# Patient Record
Sex: Male | Born: 1950 | Race: Black or African American | Hispanic: No | Marital: Married | Smoking: Former smoker
Health system: Southern US, Community
[De-identification: ages and names within clinical notes are randomized; demographics above are authoritative.]

## PROBLEM LIST (undated history)

## (undated) DIAGNOSIS — I639 Cerebral infarction, unspecified: Secondary | ICD-10-CM

## (undated) DIAGNOSIS — E785 Hyperlipidemia, unspecified: Secondary | ICD-10-CM

## (undated) DIAGNOSIS — E119 Type 2 diabetes mellitus without complications: Secondary | ICD-10-CM

## (undated) DIAGNOSIS — R413 Other amnesia: Secondary | ICD-10-CM

## (undated) DIAGNOSIS — Z972 Presence of dental prosthetic device (complete) (partial): Secondary | ICD-10-CM

## (undated) DIAGNOSIS — I10 Essential (primary) hypertension: Secondary | ICD-10-CM

## (undated) DIAGNOSIS — M109 Gout, unspecified: Secondary | ICD-10-CM

## (undated) HISTORY — DX: Gout, unspecified: M10.9

## (undated) HISTORY — PX: APPENDECTOMY: SHX54

## (undated) HISTORY — DX: Other amnesia: R41.3

## (undated) HISTORY — DX: Essential (primary) hypertension: I10

## (undated) HISTORY — DX: Type 2 diabetes mellitus without complications: E11.9

## (undated) HISTORY — DX: Hyperlipidemia, unspecified: E78.5

## (undated) HISTORY — DX: Cerebral infarction, unspecified: I63.9

---

## 2004-09-26 ENCOUNTER — Ambulatory Visit: Payer: Self-pay | Admitting: Family Medicine

## 2008-08-10 ENCOUNTER — Emergency Department: Payer: Self-pay | Admitting: Emergency Medicine

## 2010-05-13 ENCOUNTER — Inpatient Hospital Stay: Payer: Self-pay | Admitting: Internal Medicine

## 2010-05-17 ENCOUNTER — Encounter: Payer: Self-pay | Admitting: Family Medicine

## 2010-05-19 ENCOUNTER — Encounter: Payer: Self-pay | Admitting: Family Medicine

## 2015-05-22 ENCOUNTER — Encounter: Payer: Self-pay | Admitting: Family Medicine

## 2015-05-22 ENCOUNTER — Ambulatory Visit (INDEPENDENT_AMBULATORY_CARE_PROVIDER_SITE_OTHER): Payer: BLUE CROSS/BLUE SHIELD | Admitting: Family Medicine

## 2015-05-22 VITALS — BP 106/60 | HR 64 | Temp 98.0°F | Resp 16 | Ht 69.0 in | Wt 178.4 lb

## 2015-05-22 DIAGNOSIS — I10 Essential (primary) hypertension: Secondary | ICD-10-CM | POA: Diagnosis not present

## 2015-05-22 DIAGNOSIS — E1149 Type 2 diabetes mellitus with other diabetic neurological complication: Secondary | ICD-10-CM | POA: Diagnosis not present

## 2015-05-22 DIAGNOSIS — I69054 Hemiplegia and hemiparesis following nontraumatic subarachnoid hemorrhage affecting left non-dominant side: Secondary | ICD-10-CM | POA: Diagnosis not present

## 2015-05-22 DIAGNOSIS — E785 Hyperlipidemia, unspecified: Secondary | ICD-10-CM

## 2015-05-22 DIAGNOSIS — Z8673 Personal history of transient ischemic attack (TIA), and cerebral infarction without residual deficits: Secondary | ICD-10-CM | POA: Diagnosis not present

## 2015-05-22 LAB — GLUCOSE, POCT (MANUAL RESULT ENTRY): POC Glucose: 111 mg/dL — AB (ref 70–99)

## 2015-05-22 LAB — POCT GLYCOSYLATED HEMOGLOBIN (HGB A1C): HEMOGLOBIN A1C: 6.2

## 2015-05-22 NOTE — Progress Notes (Signed)
Name: Mitchell Washington   MRN: [id number]    DOB: 1951-04-05   Date:05/22/2015       Progress Note  Subjective  Chief Complaint  Chief Complaint  Patient presents with  . Diabetes  . Hyperlipidemia  . Hypertension    HPI  Diabetes  Patient presents for follow-up of diabetes which is present for 5 years years. Is currently on a regimen of  metformin thousand milligrams daily obesit. Patient states somewhat  with their diet and exercise. There's been no hypoglycemic episodes and there is no  polyuria polydipsia polyphagia. His average fasting glucoses been in the low around not being checked not being checked  with a high around not being checked . There is no end organ disease.  Last diabetic eye exam was 2 years ago.   Last visit with dietitian was several years ago . Last microalbumin was obtained August of last year and was 20   Hypertension   Patient presents for follow-up of hypertension. It has been present for over  over 5.  Patient states that there is compliance with medical regimen which consists of  lisinopril 20-12 0.5 . There is no end organ disease remote history of CVA Cardiac risk factors include hypertension hyperlipidemia and diabetes.  Exercise regimen consist of  limited walking .  Diet consist of  intermittent compliance with his ADA diet .  Hyperlipidemia  Patient has a history of hyperlipidemia for  over 5 years.  Current medical regimen consist of  atorvastatin 40 mg daily at bedtime .  Compliance is  variable .  Diet and exercise are currently followed  sometimes .  Risk factors for cardiovascular disease include hyperlipidemia , hypertension, diabetes, history of CVA .   There have been no side effects from the medication.    CVA  Patient has minimal left hemiparesis past  CVA. There is also mild dysphagia intermittently. The problem seems to be significantly limiting  Gout  Patient has had a flare of gout within the last 3 months. He has refused to take  allopurinol on a regular basis in the past. Episodes are becoming more frequent.    Past Medical History  Diagnosis Date  . Diabetes mellitus without complication (HCC)   . Hyperlipidemia   . Hypertension   . Gout   . Memory loss   . CVA (cerebral infarction)     Social History  Substance Use Topics  . Smoking status: Former Games developer  . Smokeless tobacco: Not on file  . Alcohol Use: 0.0 oz/week    0 Standard drinks or equivalent per week     Current outpatient prescriptions:  .  aspirin 81 MG tablet, Take 81 mg by mouth daily., Disp: , Rfl:  .  atorvastatin (LIPITOR) 40 MG tablet, , Disp: , Rfl: 1 .  lisinopril-hydrochlorothiazide (PRINZIDE,ZESTORETIC) 20-12.5 MG tablet, , Disp: , Rfl: 0 .  metFORMIN (GLUCOPHAGE) 1000 MG tablet, , Disp: , Rfl: 3 .  Omega-3 300 MG CAPS, Take by mouth., Disp: , Rfl:   No Known Allergies  Review of Systems  Constitutional: Negative for fever, chills and weight loss.  HENT: Negative for congestion, hearing loss, sore throat and tinnitus.   Eyes: Negative for blurred vision, double vision and redness.  Respiratory: Negative for cough, hemoptysis and shortness of breath.   Cardiovascular: Negative for chest pain, palpitations, orthopnea, claudication and leg swelling.  Gastrointestinal: Negative for heartburn, nausea, vomiting, diarrhea, constipation and blood in stool.  Genitourinary: Negative for dysuria, urgency, frequency  and hematuria.  Musculoskeletal: Positive for joint pain. Negative for myalgias, back pain, falls and neck pain.  Skin: Negative for itching.  Neurological: Positive for focal weakness. Negative for dizziness, tingling, tremors, seizures, loss of consciousness, weakness and headaches.  Endo/Heme/Allergies: Does not bruise/bleed easily.  Psychiatric/Behavioral: Negative for depression and substance abuse. The patient is not nervous/anxious (minimal left hemiparesis) and does not have insomnia.      Objective  Filed  Vitals:   05/22/15 0851  BP: 106/60  Pulse: 64  Temp: 98 F (36.7 C)  Resp: 16  Height:  (1.753 m)  Weight: 178 lb 6 oz (80.91 kg)  SpO2: 98%     Physical Exam  Constitutional: He is oriented to person, place, and time and well-developed, well-nourished, and in no distress.  HENT:  Head: Normocephalic.  Eyes: EOM are normal. Pupils are equal, round, and reactive to light.  Neck: Normal range of motion. Neck supple. No thyromegaly present.  Cardiovascular: Normal rate, regular rhythm, normal heart sounds and intact distal pulses.   No murmur heard. Pulmonary/Chest: Effort normal and breath sounds normal. No respiratory distress. He has no wheezes.  Abdominal: Soft. Bowel sounds are normal.  Musculoskeletal: Normal range of motion. He exhibits no edema.  Lymphadenopathy:    He has no cervical adenopathy.  Neurological: He is alert and oriented to person, place, and time. No cranial nerve deficit. Gait normal. Coordination normal.  5-/5 strength in the upper extremities.  Skin: Skin is warm and dry. No rash noted.  Psychiatric: Judgment normal.  Somewhat anxious and loquacious      Assessment & Plan

## 2015-06-02 NOTE — Progress Notes (Signed)
Name: Mitchell Washington   MRN: [id number]    DOB: [birth year]   Date:06/02/2015       Progress Note  Subjective  Chief Complaint  Chief Complaint  Patient presents with  . Diabetes  . Hyperlipidemia  . Hypertension    HPI   Follow-up diabetes   Patient states she is more compliant with his diet and exercise currently. Is currently on metformin 1000 mg daily. He is not checking his regular blood sugars. This been no polyuria polydipsia polyphagia. Risk factors for cardiovascular disease include diabetes hypertension hyperlipidemia and history of CVA. Also H.   Hypertension follow    patient has history of hypertension for over 5 years. Is currently on lisinopril HCTZ 12.5 mg daily. He denies any chest pain palpitations orthopnea PND or extremity swelling. He is on a low sodium diet most part.   Hyperlipidemia    patient is currently on atorvastatin 40 mg by mouth daily at bedtime along with omega-3 capsules over-the-counter. Additionally takes aspirin 81 mg daily. He denies any side effects from the medications. This been no chest pain palpitations or claudication symptoms. Is no nausea vomiting or myalgias.   Past Medical History  Diagnosis Date  . Diabetes mellitus without complication (HCC)   . Hyperlipidemia   . Hypertension   . Gout   . Memory loss   . CVA (cerebral infarction)     Social History  Substance Use Topics  . Smoking status: Former Games developer  . Smokeless tobacco: Not on file  . Alcohol Use: 0.0 oz/week    0 Standard drinks or equivalent per week     Current outpatient prescriptions:  .  aspirin 81 MG tablet, Take 81 mg by mouth daily., Disp: , Rfl:  .  atorvastatin (LIPITOR) 40 MG tablet, , Disp: , Rfl: 1 .  lisinopril-hydrochlorothiazide (PRINZIDE,ZESTORETIC) 20-12.5 MG tablet, , Disp: , Rfl: 0 .  metFORMIN (GLUCOPHAGE) 1000 MG tablet, , Disp: , Rfl: 3 .  Omega-3 300 MG CAPS, Take by mouth., Disp: , Rfl:   No Known Allergies  Review of Systems   Constitutional: Negative for fever, chills and weight loss.  HENT: Negative for congestion, hearing loss, sore throat and tinnitus.   Eyes: Negative for blurred vision, double vision and redness.  Respiratory: Negative for cough, hemoptysis and shortness of breath.   Cardiovascular: Negative for chest pain, palpitations, orthopnea, claudication and leg swelling.  Gastrointestinal: Negative for heartburn, nausea, vomiting, diarrhea, constipation and blood in stool.  Genitourinary: Negative for dysuria, urgency, frequency and hematuria.  Musculoskeletal: Negative for myalgias, back pain, joint pain, falls and neck pain.  Skin: Negative for itching.  Neurological: Negative for dizziness, tingling, tremors, focal weakness, seizures, loss of consciousness, weakness and headaches.  Endo/Heme/Allergies: Does not bruise/bleed easily.  Psychiatric/Behavioral: Negative for depression and substance abuse. The patient is not nervous/anxious and does not have insomnia.      Objective  Filed Vitals:   05/22/15 0851  BP: 106/60  Pulse: 64  Temp: 98 F (36.7 C)  Resp: 16  Height:  (1.753 m)  Weight: 178 lb 6 oz (80.91 kg)  SpO2: 98%     Physical Exam  Constitutional: He is oriented to person, place, and time and well-developed, well-nourished, and in no distress.  HENT:  Head: Normocephalic.  Eyes: EOM are normal. Pupils are equal, round, and reactive to light.  Neck: Normal range of motion. Neck supple. No thyromegaly present.  Cardiovascular: Normal rate, regular rhythm, normal heart sounds and intact  distal pulses.   No murmur heard. Pulmonary/Chest: Effort normal and breath sounds normal. No respiratory distress. He has no wheezes.  Abdominal: Soft. Bowel sounds are normal.  Musculoskeletal: Normal range of motion. He exhibits no edema.  Lymphadenopathy:    He has no cervical adenopathy.  Neurological: He is alert and oriented to person, place, and time. No cranial nerve  deficit. Gait normal. Coordination normal.  Skin: Skin is warm and dry. No rash noted.  Psychiatric: Affect and judgment normal.      Assessment & Plan  1. DM (diabetes mellitus), type 2 with neurological complications (HCC) stable - POCT Glucose (CBG) - POCT HgB A1C  2. Essential hypertension Well-controlled  3. Hyperlipidemia stable  4. Status post CVA stable  5. Hemiplga following ntrm subarach hemor aff left nondom side fluctuating

## 2015-07-05 ENCOUNTER — Other Ambulatory Visit: Payer: Self-pay

## 2015-07-05 MED ORDER — METFORMIN HCL 1000 MG PO TABS: 1000.0000 mg | ORAL_TABLET | Freq: Every day | ORAL | Status: DC

## 2015-09-21 ENCOUNTER — Ambulatory Visit: Payer: BLUE CROSS/BLUE SHIELD | Admitting: Family Medicine

## 2015-10-20 ENCOUNTER — Ambulatory Visit: Payer: BLUE CROSS/BLUE SHIELD | Admitting: Family Medicine

## 2015-10-22 ENCOUNTER — Other Ambulatory Visit: Payer: Self-pay | Admitting: Family Medicine

## 2015-11-03 ENCOUNTER — Other Ambulatory Visit: Payer: Self-pay | Admitting: Family Medicine

## 2015-11-03 MED ORDER — METFORMIN HCL 1000 MG PO TABS: 1000.0000 mg | ORAL_TABLET | Freq: Every day | ORAL | Status: DC

## 2015-11-03 MED ORDER — ATORVASTATIN CALCIUM 40 MG PO TABS: 40.0000 mg | ORAL_TABLET | Freq: Once | ORAL | Status: DC

## 2015-11-03 MED ORDER — LISINOPRIL-HYDROCHLOROTHIAZIDE 20-12.5 MG PO TABS: 1.0000 | ORAL_TABLET | Freq: Every day | ORAL | Status: DC

## 2015-11-28 ENCOUNTER — Ambulatory Visit: Payer: BLUE CROSS/BLUE SHIELD | Admitting: Family Medicine

## 2016-01-22 ENCOUNTER — Telehealth: Payer: Self-pay | Admitting: Family Medicine

## 2016-01-22 NOTE — Telephone Encounter (Signed)
Requesting refill on Atorvastatin 40mg . I did inform patient that he will probably need to schedule appointment however he asked that I send the message anyway.

## 2016-01-23 MED ORDER — ATORVASTATIN CALCIUM 40 MG PO TABS: 40.0000 mg | ORAL_TABLET | Freq: Once | ORAL | Status: DC

## 2016-01-23 NOTE — Telephone Encounter (Signed)
I will send a 30 day supply with no refills, he must schedule ana appointment for refills. Sent to [facility] on [address]

## 2016-03-03 ENCOUNTER — Other Ambulatory Visit: Payer: Self-pay | Admitting: Family Medicine

## 2016-04-14 ENCOUNTER — Other Ambulatory Visit: Payer: Self-pay | Admitting: Family Medicine

## 2017-06-18 DIAGNOSIS — H34812 Central retinal vein occlusion, left eye, with macular edema: Secondary | ICD-10-CM | POA: Diagnosis not present

## 2017-07-17 DIAGNOSIS — H34812 Central retinal vein occlusion, left eye, with macular edema: Secondary | ICD-10-CM | POA: Diagnosis not present

## 2017-12-30 ENCOUNTER — Emergency Department
Admission: EM | Admit: 2017-12-30 | Discharge: 2017-12-30 | Disposition: A | Discharge status: Home / Self Care | Payer: Medicare HMO | Attending: Emergency Medicine | Admitting: Emergency Medicine

## 2017-12-30 ENCOUNTER — Other Ambulatory Visit: Payer: Self-pay

## 2017-12-30 DIAGNOSIS — Z9114 Patient's other noncompliance with medication regimen: Secondary | ICD-10-CM | POA: Insufficient documentation

## 2017-12-30 DIAGNOSIS — R001 Bradycardia, unspecified: Secondary | ICD-10-CM | POA: Diagnosis not present

## 2017-12-30 DIAGNOSIS — Z87891 Personal history of nicotine dependence: Secondary | ICD-10-CM | POA: Insufficient documentation

## 2017-12-30 DIAGNOSIS — R739 Hyperglycemia, unspecified: Secondary | ICD-10-CM | POA: Insufficient documentation

## 2017-12-30 DIAGNOSIS — E119 Type 2 diabetes mellitus without complications: Secondary | ICD-10-CM | POA: Diagnosis not present

## 2017-12-30 DIAGNOSIS — H538 Other visual disturbances: Secondary | ICD-10-CM | POA: Diagnosis present

## 2017-12-30 DIAGNOSIS — N289 Disorder of kidney and ureter, unspecified: Secondary | ICD-10-CM

## 2017-12-30 DIAGNOSIS — I10 Essential (primary) hypertension: Secondary | ICD-10-CM | POA: Insufficient documentation

## 2017-12-30 HISTORY — DX: Cerebral infarction, unspecified: I63.9

## 2017-12-30 LAB — BASIC METABOLIC PANEL
Anion gap: 6 (ref 5–15)
BUN: 14 mg/dL (ref 6–20)
CHLORIDE: 100 mmol/L — AB (ref 101–111)
CO2: 29 mmol/L (ref 22–32)
CREATININE: 1.32 mg/dL — AB (ref 0.61–1.24)
Calcium: 9.1 mg/dL (ref 8.9–10.3)
GFR calc non Af Amer: 55 mL/min — ABNORMAL LOW (ref >60)
GLUCOSE: 284 mg/dL — AB (ref 65–99)
Potassium: 3.7 mmol/L (ref 3.5–5.1)
Sodium: 135 mmol/L (ref 135–145)

## 2017-12-30 LAB — CBC
HCT: 43.1 % (ref 40.0–52.0)
Hemoglobin: 14.3 g/dL (ref 13.0–18.0)
MCH: 27.5 pg (ref 26.0–34.0)
MCHC: 33.2 g/dL (ref 32.0–36.0)
MCV: 83.1 fL (ref 80.0–100.0)
PLATELETS: 329 10*3/uL (ref 150–440)
RBC: 5.19 MIL/uL (ref 4.40–5.90)
RDW: 13.6 % (ref 11.5–14.5)
WBC: 7.8 10*3/uL (ref 3.8–10.6)

## 2017-12-30 LAB — URINALYSIS, COMPLETE (UACMP) WITH MICROSCOPIC
BILIRUBIN URINE: NEGATIVE
Bacteria, UA: NONE SEEN
Glucose, UA: >=500 mg/dL — AB
KETONES UR: NEGATIVE mg/dL
Nitrite: NEGATIVE
PH: 5 (ref 5.0–8.0)
Protein, ur: 30 mg/dL — AB
Specific Gravity, Urine: 1.021 (ref 1.005–1.030)

## 2017-12-30 LAB — GLUCOSE, CAPILLARY: Glucose-Capillary: 281 mg/dL — ABNORMAL HIGH (ref 65–99)

## 2017-12-30 MED ORDER — SODIUM CHLORIDE 0.9 % IV BOLUS: 1000.0000 mL | Freq: Once | INTRAVENOUS | Status: DC

## 2017-12-30 MED ORDER — INSULIN ASPART 100 UNIT/ML ~~LOC~~ SOLN
10.0000 [IU] | Freq: Once | SUBCUTANEOUS | Status: DC
  Filled 2017-12-30: qty 1

## 2017-12-30 MED ORDER — METOPROLOL TARTRATE 25 MG PO TABS: 25.0000 mg | ORAL_TABLET | Freq: Two times a day (BID) | ORAL | 0 refills | Status: DC

## 2017-12-30 MED ORDER — METFORMIN HCL 500 MG PO TABS: 500.0000 mg | ORAL_TABLET | Freq: Two times a day (BID) | ORAL | 0 refills | Status: DC

## 2017-12-30 MED ORDER — METOPROLOL TARTRATE 25 MG PO TABS
25.0000 mg | ORAL_TABLET | Freq: Once | ORAL | Status: AC
  Administered 2017-12-30: 25 mg via ORAL
  Filled 2017-12-30: qty 1

## 2017-12-30 MED ORDER — INSULIN ASPART 100 UNIT/ML ~~LOC~~ SOLN
10.0000 [IU] | Freq: Once | SUBCUTANEOUS | Status: AC
  Administered 2017-12-30: 10 [IU] via SUBCUTANEOUS

## 2017-12-30 NOTE — ED Notes (Signed)
Patient refusing IV and fluids at this time. States he hates needles and doesn't understand why he needs to be stuck again. This RN explained to patient that his glucose was high and he needed insulin and IV fluids to get it down. Patient verbalized understanding but continues to refuse IV. Will wait for MD evaluation before attempting IV again.

## 2017-12-30 NOTE — ED Triage Notes (Signed)
To ER via POV c/o blurry vision X 2-3 days. Pt also reports hyperglycemia and hypertension for 2-3 days. Pt alert and oriented X4, active, cooperative, pt in NAD. RR even and unlabored, color WNL.  Stroke screen negative.

## 2017-12-30 NOTE — ED Provider Notes (Signed)
[facility] Emergency Department Provider Note  ____________________________________________  Time seen: Approximately 9:47 PM  I have reviewed the triage vital signs and the nursing notes.   HISTORY  Chief Complaint Hyperglycemia and Blurred Vision    HPI Mitchell Washington is a 67 y.o. male w/ a hx of HTN, DM, HL, CVA, presenting for medication noncompliance with hypoglycemia and hypertension.  He and his wife report that he is not taking any medications for the past 2 or 3 years, since his primary care physician retired.  He has not attempted to get a new primary care physician.  He denies any acute symptoms and reports that his wife made him come in for evaluation tonight.  Past Medical History:  Diagnosis Date  . CVA (cerebral infarction)   . Diabetes mellitus without complication (HCC)   . Gout   . Hyperlipidemia   . Hypertension   . Memory loss   . Stroke [facility])     There are no active problems to display for this patient.   Past Surgical History:  Procedure Laterality Date  . APPENDECTOMY      Current Outpatient Rx  . Order #: [id number] Class: Historical Med  . Order #: [id number] Class: Normal  . Order #: [id number] Class: Normal  . Order #: [id number] Class: Print  . Order #: [id number] Class: Print  . Order #: [id number] Class: Historical Med    Allergies Patient has no known allergies.  Family History  Problem Relation Age of Onset  . Diabetes Mother     Social History Social History   Tobacco Use  . Smoking status: Former Smoker  Substance Use Topics  . Alcohol use: Yes    Alcohol/week: 0.0 oz  . Drug use: No    Review of Systems Constitutional: No fever/chills. No lightheadedness or syncope. Eyes: Progressively worsening vision w/ no acute changes tonight.  No diplopia. ENT: No sore throat. No congestion or rhinorrhea. Cardiovascular: Denies chest pain. Denies palpitations. Respiratory: Denies shortness of breath.  No  cough. Gastrointestinal: No abdominal pain.  No nausea, no vomiting.  No diarrhea.  No constipation. Genitourinary: Negative for dysuria. Musculoskeletal: Negative for back pain. Skin: Negative for rash. Neurological: Negative for headaches. No focal numbness, tingling or weakness.  Endocrine:+ frequent urination; no polydipsia.    ____________________________________________   PHYSICAL EXAM:  VITAL SIGNS: ED Triage Vitals  Enc Vitals Group     BP 12/30/17 1808 (!) 168/82     Pulse Rate 12/30/17 1808 67     Resp 12/30/17 1808 18     Temp 12/30/17 1808 98.5 F (36.9 C)     Temp Source 12/30/17 1808 Oral     SpO2 12/30/17 1808 98 %     Weight 12/30/17 1809 180 lb (81.6 kg)     Height 12/30/17 1809  (1.727 m)     Head Circumference --      Peak Flow --      Pain Score 12/30/17 1809 0     Pain Loc --      Pain Edu? --      Excl. in GC? --     Constitutional: Alert and oriented. Well appearing and in no acute distress. Answers questions appropriately. Eyes: Conjunctivae are normal.  EOMI. No scleral icterus. Head: Atraumatic. Nose: No congestion/rhinnorhea. Mouth/Throat: Mucous membranes are moist.  Neck: No stridor.  Supple.   Cardiovascular: Normal rate, regular rhythm. No murmurs, rubs or gallops.  Respiratory: Normal respiratory effort.  No accessory muscle use or retractions. Lungs  CTAB.  No wheezes, rales or ronchi. Gastrointestinal: Obese Soft, nontender and nondistended.  No guarding or rebound.  No peritoneal signs. Musculoskeletal: No LE edema. Neurologic:  A&Ox3.  Speech is clear.  Face and smile are symmetric.  EOMI.  Moves all extremities well. Skin:  Skin is warm, dry and intact. No rash noted. Psychiatric: Mood and affect are normal. Speech and behavior are normal.  Normal judgement  ____________________________________________   LABS (all labs ordered are listed, but only abnormal results are displayed)  Labs Reviewed  BASIC METABOLIC PANEL -  Abnormal; Notable for the following components:      Result Value   Chloride 100 (*)    Glucose, Bld 284 (*)    Creatinine, Ser 1.32 (*)    GFR calc non Af Amer 55 (*)    All other components within normal limits  URINALYSIS, COMPLETE (UACMP) WITH MICROSCOPIC - Abnormal; Notable for the following components:   Color, Urine YELLOW (*)    APPearance HAZY (*)    Glucose, UA >=500 (*)    Hgb urine dipstick SMALL (*)    Protein, ur 30 (*)    Leukocytes, UA LARGE (*)    WBC, UA >50 (*)    All other components within normal limits  GLUCOSE, CAPILLARY - Abnormal; Notable for the following components:   Glucose-Capillary 281 (*)    All other components within normal limits  CBC  CBG MONITORING, ED   ____________________________________________  EKG  ED ECG REPORT I, Rockne Menghini, the attending physician, personally viewed and interpreted this ECG.   Date: 12/30/2017  EKG Time: 2200  Rate: 58  Rhythm: sinus bradycardia  Axis: normal  Intervals:none  ST&T Change: No STEMI  ____________________________________________  RADIOLOGY  No results found.  ____________________________________________   PROCEDURES  Procedure(s) performed: None  Procedures  Critical Care performed: No ____________________________________________   INITIAL IMPRESSION / ASSESSMENT AND PLAN / ED COURSE  Pertinent labs & imaging results that were available during my care of the patient were reviewed by me and considered in my medical decision making (see chart for details).  67 y.o. M w/ a hx of HTN and HL, DM, CVA w/ medication noncompliance for 2-3 years brought by his wife for medication noncompliance w/o any acute complaints.  Patient is markedly hypertensive with a blood pressure of 209/110 on my examination.  We will treat him with metoprolol but he does not have any evidence of acute symptoms including CVA, ACS or MI.  His laboratory studies do show hyperglycemia w/o DKA, and renal  insufficiency which is likely worsened due to his chronically unmanaged hypertension.  The patient's blood counts are within normal limits.  He does have glucose urea but no bacteria in his urine.  He does have elevated wbc and LE so a culture has been sent.  Plan subq insulin w/ temporizing prescription for metformin, and metoprolol here with prescription, as well as referral for PMD.  The patient will be discharged and I have discussed f/u instructions and return precautions with him and his wife.  ____________________________________________  FINAL CLINICAL IMPRESSION(S) / ED DIAGNOSES  Final diagnoses:  Hyperglycemia  Hypertension, unspecified type  Renal insufficiency  Noncompliance with medications         NEW MEDICATIONS STARTED DURING THIS VISIT:  New Prescriptions   METFORMIN (GLUCOPHAGE) 500 MG TABLET    Take 1 tablet (500 mg total) by mouth 2 (two) times daily with a meal.   METOPROLOL TARTRATE (LOPRESSOR) 25 MG TABLET  Take 1 tablet (25 mg total) by mouth 2 (two) times daily.      Rockne Menghini, MD 12/30/17 2213

## 2017-12-30 NOTE — Discharge Instructions (Signed)
Please establish a primary care physician to manage all your chronic illnesses.  Take all your medications as prescribed.  Return to the emergency department for severe pain, lightheadedness or fainting, numbness tingling or weakness, chest pain, shortness of breath, vomiting, or any other symptoms concerning to you.

## 2018-01-15 DIAGNOSIS — E1142 Type 2 diabetes mellitus with diabetic polyneuropathy: Secondary | ICD-10-CM | POA: Insufficient documentation

## 2018-01-15 DIAGNOSIS — E7800 Pure hypercholesterolemia, unspecified: Secondary | ICD-10-CM | POA: Insufficient documentation

## 2018-01-15 DIAGNOSIS — N1832 Chronic kidney disease, stage 3b: Secondary | ICD-10-CM | POA: Insufficient documentation

## 2018-01-15 DIAGNOSIS — N183 Chronic kidney disease, stage 3 unspecified: Secondary | ICD-10-CM | POA: Insufficient documentation

## 2018-01-15 DIAGNOSIS — I679 Cerebrovascular disease, unspecified: Secondary | ICD-10-CM | POA: Insufficient documentation

## 2018-01-15 DIAGNOSIS — I10 Essential (primary) hypertension: Secondary | ICD-10-CM | POA: Insufficient documentation

## 2018-03-24 DIAGNOSIS — R413 Other amnesia: Secondary | ICD-10-CM | POA: Insufficient documentation

## 2018-10-01 ENCOUNTER — Ambulatory Visit: Payer: Medicare HMO | Admitting: Podiatry

## 2018-10-01 ENCOUNTER — Encounter: Payer: Self-pay | Admitting: Podiatry

## 2018-10-01 VITALS — BP 154/77 | HR 57

## 2018-10-01 DIAGNOSIS — E1151 Type 2 diabetes mellitus with diabetic peripheral angiopathy without gangrene: Secondary | ICD-10-CM

## 2018-10-01 DIAGNOSIS — M79674 Pain in right toe(s): Secondary | ICD-10-CM | POA: Diagnosis not present

## 2018-10-01 DIAGNOSIS — B351 Tinea unguium: Secondary | ICD-10-CM

## 2018-10-01 DIAGNOSIS — M79675 Pain in left toe(s): Secondary | ICD-10-CM | POA: Diagnosis not present

## 2018-10-01 NOTE — Progress Notes (Signed)
This patient presents to the office with chief complaint of long thick nails and diabetic feet.  This patient  says there  is  no pain and discomfort in his  feet.  This patient says there are long thick painful nails.  These nails are painful walking and wearing shoes.  Patient has no history of infection or drainage from both feet.  Patient is unable to  self treat his own nails . This patient presents  to the office today for treatment of the  long nails and a foot evaluation due to history of  diabetes.  General Appearance  Alert, conversant and in no acute stress.  Vascular  Dorsalis pedis and posterior tibial  pulses are not  palpable  bilaterally.  Capillary return is within normal limits  bilaterally. Temperature is within normal limits  bilaterally.  Neurologic  Senn-Weinstein monofilament wire test within normal limits  bilaterally. Muscle power within normal limits bilaterally.  Nails Thick disfigured discolored nails with subungual debris  from hallux to fifth toes bilaterally. No evidence of bacterial infection or drainage bilaterally.  Orthopedic  No limitations of motion of motion feet .  No crepitus or effusions noted.  No bony pathology or digital deformities noted.  Skin  normotropic skin with no porokeratosis noted bilaterally.  No signs of infections or ulcers noted.     Onychomycosis  Diabetes with no foot complications  IE  Debride nails x 10.  A diabetic foot exam was performed and there is no evidence of any  neurologic pathology.  Absent pulses  B/L.  RTC 3 months.   Helane Gunther DPM

## 2018-10-11 ENCOUNTER — Emergency Department: Payer: Medicare HMO

## 2018-10-11 ENCOUNTER — Other Ambulatory Visit: Payer: Self-pay

## 2018-10-11 ENCOUNTER — Encounter: Payer: Self-pay | Admitting: *Deleted

## 2018-10-11 ENCOUNTER — Emergency Department
Admission: EM | Admit: 2018-10-11 | Discharge: 2018-10-11 | Disposition: A | Discharge status: Home / Self Care | Payer: Medicare HMO | Attending: Emergency Medicine | Admitting: Emergency Medicine

## 2018-10-11 DIAGNOSIS — Z7982 Long term (current) use of aspirin: Secondary | ICD-10-CM | POA: Insufficient documentation

## 2018-10-11 DIAGNOSIS — I10 Essential (primary) hypertension: Secondary | ICD-10-CM | POA: Insufficient documentation

## 2018-10-11 DIAGNOSIS — R55 Syncope and collapse: Secondary | ICD-10-CM

## 2018-10-11 DIAGNOSIS — Z79899 Other long term (current) drug therapy: Secondary | ICD-10-CM | POA: Diagnosis not present

## 2018-10-11 DIAGNOSIS — Z8673 Personal history of transient ischemic attack (TIA), and cerebral infarction without residual deficits: Secondary | ICD-10-CM | POA: Insufficient documentation

## 2018-10-11 DIAGNOSIS — N390 Urinary tract infection, site not specified: Secondary | ICD-10-CM | POA: Insufficient documentation

## 2018-10-11 DIAGNOSIS — E119 Type 2 diabetes mellitus without complications: Secondary | ICD-10-CM | POA: Diagnosis not present

## 2018-10-11 DIAGNOSIS — Z87891 Personal history of nicotine dependence: Secondary | ICD-10-CM | POA: Insufficient documentation

## 2018-10-11 LAB — CBC
HCT: 47.2 % (ref 39.0–52.0)
HEMOGLOBIN: 15 g/dL (ref 13.0–17.0)
MCH: 27 pg (ref 26.0–34.0)
MCHC: 31.8 g/dL (ref 30.0–36.0)
MCV: 84.9 fL (ref 80.0–100.0)
NRBC: 0 % (ref 0.0–0.2)
Platelets: 332 10*3/uL (ref 150–400)
RBC: 5.56 MIL/uL (ref 4.22–5.81)
RDW: 13.8 % (ref 11.5–15.5)
WBC: 9.3 10*3/uL (ref 4.0–10.5)

## 2018-10-11 LAB — URINALYSIS, COMPLETE (UACMP) WITH MICROSCOPIC
Bilirubin Urine: NEGATIVE
Glucose, UA: NEGATIVE mg/dL
KETONES UR: NEGATIVE mg/dL
Nitrite: NEGATIVE
PROTEIN: 100 mg/dL — AB
Specific Gravity, Urine: 1.028 (ref 1.005–1.030)
pH: 5 (ref 5.0–8.0)

## 2018-10-11 LAB — BASIC METABOLIC PANEL
Anion gap: 10 (ref 5–15)
BUN: 19 mg/dL (ref 8–23)
CO2: 26 mmol/L (ref 22–32)
Calcium: 9 mg/dL (ref 8.9–10.3)
Chloride: 105 mmol/L (ref 98–111)
Creatinine, Ser: 1.55 mg/dL — ABNORMAL HIGH (ref 0.61–1.24)
GFR, EST AFRICAN AMERICAN: 53 mL/min — AB (ref >60)
GFR, EST NON AFRICAN AMERICAN: 46 mL/min — AB (ref >60)
Glucose, Bld: 129 mg/dL — ABNORMAL HIGH (ref 70–99)
POTASSIUM: 3.8 mmol/L (ref 3.5–5.1)
SODIUM: 141 mmol/L (ref 135–145)

## 2018-10-11 LAB — TROPONIN I

## 2018-10-11 MED ORDER — SODIUM CHLORIDE 0.9 % IV BOLUS
500.0000 mL | Freq: Once | INTRAVENOUS | Status: AC
  Administered 2018-10-11: 500 mL via INTRAVENOUS

## 2018-10-11 MED ORDER — AMOXICILLIN-POT CLAVULANATE 875-125 MG PO TABS
1.0000 | ORAL_TABLET | Freq: Once | ORAL | Status: AC
  Administered 2018-10-11: 1 via ORAL
  Filled 2018-10-11: qty 1

## 2018-10-11 MED ORDER — AMOXICILLIN-POT CLAVULANATE 875-125 MG PO TABS: 1.0000 | ORAL_TABLET | Freq: Two times a day (BID) | ORAL | 0 refills | Status: AC

## 2018-10-11 NOTE — ED Provider Notes (Signed)
[facility] Emergency Department Provider Note ____________________________________________   First MD Initiated Contact with Patient 10/11/18 1241     (approximate)  I have reviewed the triage vital signs and the nursing notes.  HISTORY  Chief Complaint Near Syncope  EM caveat: The patient himself has severe short-term memory problems, cannot recall exactly what happened.  Family reports this is normal for him  HPI Mitchell Washington is a 68 y.o. male here for evaluation after passing out  Patient was preaching at church, as he was standing at the public for about 10 to 15 minutes he started to be observed by family start looking lightheaded.  He was assisted into a chair and passed out very briefly.  He woke up oriented.  Brought for further evaluation.  Patient reports he feels all right now.  Family reports he does not have enough water and fluid intake some days.  He also had a little bit of lightheadedness yesterday that he had noted.  No numbness with weakness tingling or trouble speaking.  No chest pain or trouble breathing.  Denies a history of any heart problem.  Does however have a history of stroke that has left him with severe short-term memory problems   Past Medical History:  Diagnosis Date  . CVA (cerebral infarction)   . Diabetes mellitus without complication (HCC)   . Gout   . Hyperlipidemia   . Hypertension   . Memory loss   . Stroke [facility](HCC)     There are no active problems to display for this patient.   Past Surgical History:  Procedure Laterality Date  . APPENDECTOMY      Prior to Admission medications   Medication Sig Start Date End Date Taking? Authorizing Provider  amoxicillin-clavulanate (AUGMENTIN) 875-125 MG tablet Take 1 tablet by mouth 2 (two) times daily for 7 days. 10/11/18 10/18/18  Sharyn CreamerQuale, Mark, MD  aspirin EC 81 MG tablet Take by mouth.    [provider]  atorvastatin (LIPITOR) 10 MG tablet TAKE 1 TABLET BY MOUTH  EVERY DAY 07/06/18   [provider]  [employer] VITAMIN B12 1000 MCG tablet Take 1,000 mcg by mouth daily. 09/21/18   [provider]  donepezil (ARICEPT) 10 MG tablet  08/27/18   [provider]  glipiZIDE (GLUCOTROL XL) 5 MG 24 hr tablet Take by mouth. 04/15/18 04/15/19  [provider]  lisinopril (PRINIVIL,ZESTRIL) 20 MG tablet TAKE 1 TABLET BY MOUTH EVERY DAY 07/06/18   [provider]  memantine (NAMENDA) 5 MG tablet Take by mouth. 09/02/18 09/02/19  [provider]  metFORMIN (GLUCOPHAGE-XR) 500 MG 24 hr tablet Take by mouth. 01/15/18 01/15/19  [provider]    Allergies Patient has no known allergies.  Family History  Problem Relation Age of Onset  . Diabetes Mother     Social History Social History   Tobacco Use  . Smoking status: Former Games developermoker  . Smokeless tobacco: Never Used  Substance Use Topics  . Alcohol use: Not Currently    Alcohol/week: 0.0 standard drinks  . Drug use: Never    Review of Systems EM caveat Family reports no recent illness.  He is acting normally for them.  He has never passed out before.    ____________________________________________   PHYSICAL EXAM:  VITAL SIGNS: ED Triage Vitals  Enc Vitals Group     BP 10/11/18 1243 (!) 142/74     Pulse Rate 10/11/18 1243 66     Resp 10/11/18 1243 18  Temp 10/11/18 1243 97.6 F (36.4 C)     Temp Source 10/11/18 1243 Oral     SpO2 10/11/18 1243 99 %     Weight 10/11/18 1246 175 lb (79.4 kg)     Height 10/11/18 1246 5\' 10"  (1.778 m)     Head Circumference --      Peak Flow --      Pain Score 10/11/18 1245 0     Pain Loc --      Pain Edu? --      Excl. in GC? --     Constitutional: Alert and oriented. Well appearing and in no acute distress.  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 Eyes: Conjunctivae are normal. Head: Atraumatic. Nose: No  congestion/rhinnorhea. Mouth/Throat: Mucous membranes are moist. Neck: No stridor.  Cardiovascular: Normal rate, regular rhythm. Grossly normal heart sounds.  Good peripheral circulation. Respiratory: Normal respiratory effort.  No retractions. Lungs CTAB. Gastrointestinal: Soft and nontender. No distention. Musculoskeletal: No lower extremity tenderness nor edema. Neurologic:  Normal speech and language. No gross focal neurologic deficits are appreciated.  Skin:  Skin is warm, dry and intact. No rash noted. Psychiatric: Mood and affect are normal. Speech and behavior are normal.  ____________________________________________   LABS (all labs ordered are listed, but only abnormal results are displayed)  Labs Reviewed  URINE CULTURE - Abnormal; Notable for the following components:      Result Value   Culture   (*)    Value: <10,000 COLONIES/mL INSIGNIFICANT GROWTH Performed at [facility], [address], [place], [place] [address]    All other components within normal limits  BASIC METABOLIC PANEL - Abnormal; Notable for the following components:   Glucose, Bld 129 (*)    Creatinine, Ser 1.55 (*)    GFR calc non Af Amer 46 (*)    GFR calc Af Amer 53 (*)    All other components within normal limits  URINALYSIS, COMPLETE (UACMP) WITH MICROSCOPIC - Abnormal; Notable for the following components:   Color, Urine YELLOW (*)    APPearance HAZY (*)    Hgb urine dipstick SMALL (*)    Protein, ur 100 (*)    Leukocytes,Ua TRACE (*)    Bacteria, UA RARE (*)    All other components within normal limits  CBC  TROPONIN I   ____________________________________________  EKG  Reviewed entered by 1300 Heart rate 65 QRS 110 QTc 400 And interpreted by me Reviewed by me, no evidence of acute ischemia ____________________________________________  RADIOLOGY  No results found.  No indication for imaging denoted.  Neurologically normal.  Memory deficits are normal per family and  is acting and behaving normally without neurologic symptoms. ____________________________________________   PROCEDURES  Procedure(s) performed: None  Procedures  Critical Care performed: No  ____________________________________________   INITIAL IMPRESSION / ASSESSMENT AND PLAN / ED COURSE  Pertinent labs & imaging results that were available during my care of the patient were reviewed by me and considered in my medical decision making (see chart for details).   Syncope.  Patient was witnessed to have a syncopal episode, he was assisted to the ground to chair and did not strike the ground.  He is fully awake and alert.  He feels well at this time, but he does have notable short-term memory deficit, seems to recall distant events well and recalls passages that he was preaching, but does not seem  to have much recollection of the reasoning is here today.        Patient's previous neurology consultation did notes that he has notable short-term memory loss.  Suspected also to have moderate dementia.  Prior stroke is known, appears it is MRI distribution correlates fairly well with the chronic findings noted on today's CT and he does not have any focal neurologic deficits on exam to suggest a new or acute stroke.  Clinical history does not appear consistent with acute stroke.   ----------------------------------------- 2:56 PM on 10/11/2018 -----------------------------------------  Patient reports he is feeling well.  Would like to be discharged.  His family at the bedside report his memory issues are chronic, no change and he is acting and behaving completely normally.  SF syncope rule is NEGATIVE.  We will assist to ambulate and is since "road test" and see if he is feeling improved and send urinalysis.  If doing well anticipate discharge as he is fully awake and alert, full neurologic recovery, in no distress with reassuring evaluation here.  Patient would like to go home.   Family agreeable with plan. Return precautions and treatment recommendations and follow-up discussed with the patient who is agreeable with the plan.  Patient's son and daughter as well as wife are at bedside.  Taking him home. ____________________________________________   FINAL CLINICAL IMPRESSION(S) / ED DIAGNOSES  Final diagnoses:  Syncope, unspecified syncope type  Urinary tract infection, acute        Note:  This document was prepared using Dragon voice recognition software and may include unintentional dictation errors       Sharyn Creamer, MD 10/15/18 1238

## 2018-10-11 NOTE — ED Triage Notes (Signed)
Per EMS report, patient had an episode of sweating and dizziness at 0900 today. 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 Patient has a history of dementia with short-term memory loss.

## 2018-10-11 NOTE — ED Notes (Signed)
Pt ambulatory to end of hall and back without difficulty.

## 2018-10-13 LAB — URINE CULTURE

## 2018-12-07 ENCOUNTER — Ambulatory Visit: Payer: Medicare HMO | Admitting: Podiatry

## 2018-12-07 ENCOUNTER — Encounter: Payer: Self-pay | Admitting: Podiatry

## 2018-12-07 ENCOUNTER — Other Ambulatory Visit: Payer: Self-pay

## 2018-12-07 ENCOUNTER — Ambulatory Visit (INDEPENDENT_AMBULATORY_CARE_PROVIDER_SITE_OTHER): Payer: Medicare HMO | Admitting: Podiatry

## 2018-12-07 VITALS — Temp 98.3°F

## 2018-12-07 DIAGNOSIS — E1151 Type 2 diabetes mellitus with diabetic peripheral angiopathy without gangrene: Secondary | ICD-10-CM

## 2018-12-07 DIAGNOSIS — M79674 Pain in right toe(s): Secondary | ICD-10-CM | POA: Diagnosis not present

## 2018-12-07 DIAGNOSIS — M79675 Pain in left toe(s): Secondary | ICD-10-CM

## 2018-12-07 DIAGNOSIS — B351 Tinea unguium: Secondary | ICD-10-CM

## 2018-12-07 NOTE — Progress Notes (Signed)
Complaint:  Visit Type: Patient returns to my office for continued preventative foot care services. Complaint: Patient states" my nails have grown long and thick and become painful to walk and wear shoes" Patient has been diagnosed with DM with no foot complications. The patient presents for preventative foot care services. No changes to ROS  Podiatric Exam: Vascular: dorsalis pedis and posterior tibial pulses are not  palpable bilateral. Capillary return is immediate. Temperature gradient is WNL. Skin turgor WNL  Sensorium: Normal Semmes Weinstein monofilament test. Normal tactile sensation bilaterally. Nail Exam: Pt has thick disfigured discolored nails with subungual debris noted bilateral entire nail hallux through fifth toenails Ulcer Exam: There is no evidence of ulcer or pre-ulcerative changes or infection. Orthopedic Exam: Muscle tone and strength are WNL. No limitations in general ROM. No crepitus or effusions noted. Foot type and digits show no abnormalities. Bony prominences are unremarkable. Skin: No Porokeratosis. No infection or ulcers  Diagnosis:  Onychomycosis, , Pain in right toe, pain in left toes  Treatment & Plan Procedures and Treatment: Consent by patient was obtained for treatment procedures.   Debridement of mycotic and hypertrophic toenails, 1 through 5 bilateral and clearing of subungual debris. No ulceration, no infection noted.  Return Visit-Office Procedure: Patient instructed to return to the office for a follow up visit 3 months for continued evaluation and treatment.    Winston Sobczyk DPM 

## 2019-03-08 ENCOUNTER — Ambulatory Visit: Payer: Medicare HMO | Admitting: Podiatry

## 2019-04-08 ENCOUNTER — Ambulatory Visit: Payer: Medicare HMO | Admitting: Podiatry

## 2019-04-12 ENCOUNTER — Other Ambulatory Visit: Payer: Self-pay

## 2019-04-12 ENCOUNTER — Encounter: Payer: Self-pay | Admitting: Podiatry

## 2019-04-12 ENCOUNTER — Ambulatory Visit: Payer: Medicare HMO | Admitting: Podiatry

## 2019-04-12 VITALS — Temp 97.8°F

## 2019-04-12 DIAGNOSIS — B351 Tinea unguium: Secondary | ICD-10-CM | POA: Diagnosis not present

## 2019-04-12 DIAGNOSIS — E1151 Type 2 diabetes mellitus with diabetic peripheral angiopathy without gangrene: Secondary | ICD-10-CM

## 2019-04-12 DIAGNOSIS — M79674 Pain in right toe(s): Secondary | ICD-10-CM

## 2019-04-12 DIAGNOSIS — M79675 Pain in left toe(s): Secondary | ICD-10-CM

## 2019-04-12 NOTE — Progress Notes (Signed)
Complaint:  Visit Type: Patient returns to my office for continued preventative foot care services. Complaint: Patient states" my nails have grown long and thick and become painful to walk and wear shoes" Patient has been diagnosed with DM with no foot complications. The patient presents for preventative foot care services. No changes to ROS  Podiatric Exam: Vascular: dorsalis pedis and posterior tibial pulses are not  palpable bilateral. Capillary return is immediate. Temperature gradient is WNL. Skin turgor WNL  Sensorium: Normal Semmes Weinstein monofilament test. Normal tactile sensation bilaterally. Nail Exam: Pt has thick disfigured discolored nails with subungual debris noted bilateral entire nail hallux through fifth toenails Ulcer Exam: There is no evidence of ulcer or pre-ulcerative changes or infection. Orthopedic Exam: Muscle tone and strength are WNL. No limitations in general ROM. No crepitus or effusions noted. Foot type and digits show no abnormalities. Bony prominences are unremarkable. Skin: No Porokeratosis. No infection or ulcers  Diagnosis:  Onychomycosis, , Pain in right toe, pain in left toes  Treatment & Plan Procedures and Treatment: Consent by patient was obtained for treatment procedures.   Debridement of mycotic and hypertrophic toenails, 1 through 5 bilateral and clearing of subungual debris. No ulceration, no infection noted.  Return Visit-Office Procedure: Patient instructed to return to the office for a follow up visit 3 months for continued evaluation and treatment.    Vastie Douty DPM 

## 2019-07-12 ENCOUNTER — Other Ambulatory Visit: Payer: Self-pay

## 2019-07-12 ENCOUNTER — Ambulatory Visit (INDEPENDENT_AMBULATORY_CARE_PROVIDER_SITE_OTHER): Payer: Medicare HMO | Admitting: Podiatry

## 2019-07-12 ENCOUNTER — Encounter: Payer: Self-pay | Admitting: Podiatry

## 2019-07-12 DIAGNOSIS — M79674 Pain in right toe(s): Secondary | ICD-10-CM | POA: Diagnosis not present

## 2019-07-12 DIAGNOSIS — B351 Tinea unguium: Secondary | ICD-10-CM

## 2019-07-12 DIAGNOSIS — M79675 Pain in left toe(s): Secondary | ICD-10-CM | POA: Diagnosis not present

## 2019-07-12 DIAGNOSIS — E1151 Type 2 diabetes mellitus with diabetic peripheral angiopathy without gangrene: Secondary | ICD-10-CM | POA: Diagnosis not present

## 2019-07-12 NOTE — Progress Notes (Signed)
Complaint:  Visit Type: Patient returns to my office for continued preventative foot care services. Complaint: Patient states" my nails have grown long and thick and become painful to walk and wear shoes" Patient has been diagnosed with DM with no foot complications. The patient presents for preventative foot care services. No changes to ROS  Podiatric Exam: Vascular: dorsalis pedis and posterior tibial pulses are not  palpable bilateral. Capillary return is immediate. Temperature gradient is WNL. Skin turgor WNL  Sensorium: Normal Semmes Weinstein monofilament test. Normal tactile sensation bilaterally. Nail Exam: Pt has thick disfigured discolored nails with subungual debris noted bilateral entire nail hallux through fifth toenails Ulcer Exam: There is no evidence of ulcer or pre-ulcerative changes or infection. Orthopedic Exam: Muscle tone and strength are WNL. No limitations in general ROM. No crepitus or effusions noted. Foot type and digits show no abnormalities. Bony prominences are unremarkable. Skin: No Porokeratosis. No infection or ulcers  Diagnosis:  Onychomycosis, , Pain in right toe, pain in left toes  Treatment & Plan Procedures and Treatment: Consent by patient was obtained for treatment procedures.   Debridement of mycotic and hypertrophic toenails, 1 through 5 bilateral and clearing of subungual debris. No ulceration, no infection noted.  Return Visit-Office Procedure: Patient instructed to return to the office for a follow up visit 3 months for continued evaluation and treatment.    Jamecia Lerman DPM 

## 2019-09-06 DIAGNOSIS — E538 Deficiency of other specified B group vitamins: Secondary | ICD-10-CM | POA: Insufficient documentation

## 2019-10-02 ENCOUNTER — Ambulatory Visit: Payer: Medicare HMO | Attending: Internal Medicine

## 2019-10-02 DIAGNOSIS — Z23 Encounter for immunization: Secondary | ICD-10-CM | POA: Insufficient documentation

## 2019-10-02 NOTE — Progress Notes (Signed)
   Covid-19 Vaccination Clinic  Name:  Mitchell Washington    MRN: [id number] DOB: July 12, 1951  10/02/2019  [name] was observed post Covid-19 immunization for 15 minutes without incidence. He was provided with Vaccine Information Sheet and instruction to access the V-Safe system.   [name] was instructed to call 911 with any severe reactions post vaccine: Marland Kitchen Difficulty breathing  . Swelling of your face and throat  . A fast heartbeat  . A bad rash all over your body  . Dizziness and weakness    Immunizations Administered    Name Date Dose VIS Date Route   Pfizer COVID-19 Vaccine 10/02/2019  9:31 AM 0.3 mL 07/30/2019 Intramuscular   Manufacturer: [employer], [employer]   Lot: [id number]   NDC: [address]-4

## 2019-10-11 ENCOUNTER — Encounter: Payer: Self-pay | Admitting: Podiatry

## 2019-10-11 ENCOUNTER — Other Ambulatory Visit: Payer: Self-pay

## 2019-10-11 ENCOUNTER — Ambulatory Visit: Payer: Medicare HMO | Admitting: Podiatry

## 2019-10-11 DIAGNOSIS — M79675 Pain in left toe(s): Secondary | ICD-10-CM | POA: Diagnosis not present

## 2019-10-11 DIAGNOSIS — M79674 Pain in right toe(s): Secondary | ICD-10-CM | POA: Diagnosis not present

## 2019-10-11 DIAGNOSIS — B351 Tinea unguium: Secondary | ICD-10-CM

## 2019-10-11 DIAGNOSIS — E1151 Type 2 diabetes mellitus with diabetic peripheral angiopathy without gangrene: Secondary | ICD-10-CM | POA: Diagnosis not present

## 2019-10-11 NOTE — Progress Notes (Signed)
Complaint:  Visit Type: Patient returns to my office for continued preventative foot care services. Complaint: Patient states" my nails have grown long and thick and become painful to walk and wear shoes" Patient has been diagnosed with DM with no foot complications. The patient presents for preventative foot care services. No changes to ROS  Podiatric Exam: Vascular: dorsalis pedis and posterior tibial pulses are not  palpable bilateral. Capillary return is immediate. Temperature gradient is WNL. Skin turgor WNL  Sensorium: Normal Semmes Weinstein monofilament test. Normal tactile sensation bilaterally. Nail Exam: Pt has thick disfigured discolored nails with subungual debris noted bilateral entire nail hallux through fifth toenails Ulcer Exam: There is no evidence of ulcer or pre-ulcerative changes or infection. Orthopedic Exam: Muscle tone and strength are WNL. No limitations in general ROM. No crepitus or effusions noted. Foot type and digits show no abnormalities. Bony prominences are unremarkable. Skin: No Porokeratosis. No infection or ulcers  Diagnosis:  Onychomycosis, , Pain in right toe, pain in left toes  Treatment & Plan Procedures and Treatment: Consent by patient was obtained for treatment procedures.   Debridement of mycotic and hypertrophic toenails, 1 through 5 bilateral and clearing of subungual debris. No ulceration, no infection noted.  Return Visit-Office Procedure: Patient instructed to return to the office for a follow up visit 3 months for continued evaluation and treatment.    Helane Gunther DPM

## 2019-10-19 ENCOUNTER — Other Ambulatory Visit: Payer: Self-pay

## 2019-10-19 ENCOUNTER — Encounter: Payer: Self-pay | Admitting: Ophthalmology

## 2019-10-21 ENCOUNTER — Other Ambulatory Visit: Payer: Self-pay

## 2019-10-21 ENCOUNTER — Other Ambulatory Visit
Admission: RE | Admit: 2019-10-21 | Discharge: 2019-10-21 | Disposition: A | Discharge status: Home / Self Care | Payer: Medicare HMO | Source: Ambulatory Visit | Attending: Ophthalmology | Admitting: Ophthalmology

## 2019-10-21 DIAGNOSIS — Z01812 Encounter for preprocedural laboratory examination: Secondary | ICD-10-CM | POA: Insufficient documentation

## 2019-10-21 DIAGNOSIS — Z20822 Contact with and (suspected) exposure to covid-19: Secondary | ICD-10-CM | POA: Diagnosis not present

## 2019-10-21 LAB — SARS CORONAVIRUS 2 (TAT 6-24 HRS): SARS Coronavirus 2: NEGATIVE

## 2019-10-22 NOTE — Discharge Instructions (Signed)

## 2019-10-23 ENCOUNTER — Ambulatory Visit: Payer: Medicare HMO | Attending: Internal Medicine

## 2019-10-23 DIAGNOSIS — Z23 Encounter for immunization: Secondary | ICD-10-CM

## 2019-10-23 NOTE — Progress Notes (Signed)
   Covid-19 Vaccination Clinic  Name:  KEMONTE ULLMAN    MRN: [id number] DOB: 14-Apr-1951  10/23/2019  Mr. Bowell was observed post Covid-19 immunization for 15 minutes without incident. He was provided with Vaccine Information Sheet and instruction to access the V-Safe system.   Mr. Buffone was instructed to call 911 with any severe reactions post vaccine: Marland Kitchen Difficulty breathing  . Swelling of face and throat  . A fast heartbeat  . A bad rash all over body  . Dizziness and weakness   Immunizations Administered    Name Date Dose VIS Date Route   Pfizer COVID-19 Vaccine 10/23/2019  9:08 AM 0.3 mL 07/30/2019 Intramuscular   Manufacturer: [employer], [employer]   Lot: [id number]   NDC: [address]-8

## 2019-10-25 ENCOUNTER — Ambulatory Visit
Admission: RE | Admit: 2019-10-25 | Discharge: 2019-10-25 | Disposition: A | Discharge status: Home / Self Care | Payer: Medicare HMO | Attending: Ophthalmology | Admitting: Ophthalmology

## 2019-10-25 ENCOUNTER — Encounter
Admission: RE | Disposition: A | Discharge status: Home / Self Care | Payer: Self-pay | Source: Home / Self Care | Attending: Ophthalmology

## 2019-10-25 ENCOUNTER — Ambulatory Visit: Payer: Medicare HMO | Admitting: Anesthesiology

## 2019-10-25 ENCOUNTER — Other Ambulatory Visit: Payer: Self-pay

## 2019-10-25 ENCOUNTER — Encounter: Payer: Self-pay | Admitting: Ophthalmology

## 2019-10-25 DIAGNOSIS — Z87891 Personal history of nicotine dependence: Secondary | ICD-10-CM | POA: Insufficient documentation

## 2019-10-25 DIAGNOSIS — Z7982 Long term (current) use of aspirin: Secondary | ICD-10-CM | POA: Insufficient documentation

## 2019-10-25 DIAGNOSIS — Z8673 Personal history of transient ischemic attack (TIA), and cerebral infarction without residual deficits: Secondary | ICD-10-CM | POA: Insufficient documentation

## 2019-10-25 DIAGNOSIS — Z7984 Long term (current) use of oral hypoglycemic drugs: Secondary | ICD-10-CM | POA: Diagnosis not present

## 2019-10-25 DIAGNOSIS — Z79899 Other long term (current) drug therapy: Secondary | ICD-10-CM | POA: Diagnosis not present

## 2019-10-25 DIAGNOSIS — F0390 Unspecified dementia without behavioral disturbance: Secondary | ICD-10-CM | POA: Insufficient documentation

## 2019-10-25 DIAGNOSIS — H2512 Age-related nuclear cataract, left eye: Secondary | ICD-10-CM | POA: Insufficient documentation

## 2019-10-25 DIAGNOSIS — E119 Type 2 diabetes mellitus without complications: Secondary | ICD-10-CM | POA: Diagnosis not present

## 2019-10-25 DIAGNOSIS — I10 Essential (primary) hypertension: Secondary | ICD-10-CM | POA: Diagnosis not present

## 2019-10-25 DIAGNOSIS — E7800 Pure hypercholesterolemia, unspecified: Secondary | ICD-10-CM | POA: Diagnosis not present

## 2019-10-25 DIAGNOSIS — H34812 Central retinal vein occlusion, left eye, with macular edema: Secondary | ICD-10-CM | POA: Insufficient documentation

## 2019-10-25 HISTORY — PX: CATARACT EXTRACTION W/PHACO: SHX586

## 2019-10-25 HISTORY — DX: Presence of dental prosthetic device (complete) (partial): Z97.2

## 2019-10-25 LAB — GLUCOSE, CAPILLARY
Glucose-Capillary: 143 mg/dL — ABNORMAL HIGH (ref 70–99)
Glucose-Capillary: 145 mg/dL — ABNORMAL HIGH (ref 70–99)

## 2019-10-25 SURGERY — PHACOEMULSIFICATION, CATARACT, WITH IOL INSERTION
Anesthesia: General | Site: Eye | Laterality: Left

## 2019-10-25 MED ORDER — SODIUM HYALURONATE 23 MG/ML IO SOLN
INTRAOCULAR | Status: DC | PRN
  Administered 2019-10-25: 0.6 mL via INTRAOCULAR

## 2019-10-25 MED ORDER — TETRACAINE HCL 0.5 % OP SOLN
1.0000 [drp] | OPHTHALMIC | Status: DC | PRN
  Administered 2019-10-25 (×3): 1 [drp] via OPHTHALMIC

## 2019-10-25 MED ORDER — LIDOCAINE HCL (PF) 2 % IJ SOLN
INTRAOCULAR | Status: DC | PRN
  Administered 2019-10-25: 1 mL via INTRAOCULAR

## 2019-10-25 MED ORDER — MOXIFLOXACIN HCL 0.5 % OP SOLN
OPHTHALMIC | Status: DC | PRN
  Administered 2019-10-25: 0.2 mL via OPHTHALMIC

## 2019-10-25 MED ORDER — ACETAMINOPHEN 160 MG/5ML PO SOLN: 325.0000 mg | ORAL | Status: DC | PRN

## 2019-10-25 MED ORDER — MIDAZOLAM HCL 2 MG/2ML IJ SOLN
INTRAMUSCULAR | Status: DC | PRN
  Administered 2019-10-25: 2 mg via INTRAVENOUS

## 2019-10-25 MED ORDER — SODIUM HYALURONATE 10 MG/ML IO SOLN
INTRAOCULAR | Status: DC | PRN
  Administered 2019-10-25: 0.55 mL via INTRAOCULAR

## 2019-10-25 MED ORDER — TRIAMCINOLONE ACETONIDE 40 MG/ML IJ SUSP
INTRAMUSCULAR | Status: DC | PRN
  Administered 2019-10-25: .1 mL

## 2019-10-25 MED ORDER — ACETAMINOPHEN 325 MG PO TABS: 325.0000 mg | ORAL_TABLET | ORAL | Status: DC | PRN

## 2019-10-25 MED ORDER — EPINEPHRINE PF 1 MG/ML IJ SOLN
INTRAOCULAR | Status: DC | PRN
  Administered 2019-10-25: 88 mL via OPHTHALMIC

## 2019-10-25 MED ORDER — ARMC OPHTHALMIC DILATING DROPS
1.0000 "application " | OPHTHALMIC | Status: DC | PRN
  Administered 2019-10-25 (×3): 1 via OPHTHALMIC

## 2019-10-25 MED ORDER — FENTANYL CITRATE (PF) 100 MCG/2ML IJ SOLN
INTRAMUSCULAR | Status: DC | PRN
  Administered 2019-10-25: 100 ug via INTRAVENOUS

## 2019-10-25 SURGICAL SUPPLY — 19 items
CANNULA ANT/CHMB 27G (MISCELLANEOUS) ×2 IMPLANT
CANNULA ANT/CHMB 27GA (MISCELLANEOUS) ×4 IMPLANT
DISSECTOR HYDRO NUCLEUS 50X22 (MISCELLANEOUS) ×2 IMPLANT
GLOVE SURG LX 7.5 STRW (GLOVE) ×2
GLOVE SURG LX STRL 7.5 STRW (GLOVE) ×1 IMPLANT
GLOVE SURG SYN 8.5  E (GLOVE) ×1
GLOVE SURG SYN 8.5 E (GLOVE) ×1 IMPLANT
GLOVE SURG SYN 8.5 PF PI (GLOVE) ×1 IMPLANT
GOWN STRL REUS W/ TWL LRG LVL3 (GOWN DISPOSABLE) ×2 IMPLANT
GOWN STRL REUS W/TWL LRG LVL3 (GOWN DISPOSABLE) ×2
LENS IOL TECNIS ITEC 13.5 (Intraocular Lens) ×1 IMPLANT
MARKER SKIN DUAL TIP RULER LAB (MISCELLANEOUS) ×2 IMPLANT
PACK DR. KING ARMS (PACKS) ×2 IMPLANT
PACK EYE AFTER SURG (MISCELLANEOUS) ×2 IMPLANT
PACK OPTHALMIC (MISCELLANEOUS) ×2 IMPLANT
SYR 3ML LL SCALE MARK (SYRINGE) ×2 IMPLANT
SYR TB 1ML LUER SLIP (SYRINGE) ×2 IMPLANT
WATER STERILE IRR 250ML POUR (IV SOLUTION) ×2 IMPLANT
WIPE NON LINTING 3.25X3.25 (MISCELLANEOUS) ×2 IMPLANT

## 2019-10-25 NOTE — Op Note (Signed)
OPERATIVE NOTE  Mitchell Washington [id number] 10/25/2019   PREOPERATIVE DIAGNOSIS:   1.  Nuclear sclerotic cataract left eye.  H25.12 2.  Central retinal vein occlusion, left eye, with macular edema. W29.5621   POSTOPERATIVE DIAGNOSIS:     Same.   PROCEDURE:   1.  CPT [id number] Phacoemusification with posterior chamber intraocular lens placement of the left eye  2.  CPT [id number] Intravitreal injection of kenalog, left eye.   LENS:   Implant Name Type Inv. Item Serial No. Manufacturer Lot No. LRB No. Used Action  LENS IOL DIOP 13.5 - [id number] Intraocular Lens LENS IOL DIOP 13.5 [id number] AMO  Left 1 Implanted      Procedure(s) with comments: CATARACT EXTRACTION PHACO AND INTRAOCULAR LENS PLACEMENT (IOC) LEFT DIABETIC INTRAVEITREAL KENALOG INJECTION 1.93  00:22.6 (Left) - Diabetic - oral meds  PCB00 +13.5   ULTRASOUND TIME: 0 minutes 22 seconds.  CDE 1.93   SURGEON:  Willey Blade, MD, MPH   ANESTHESIA:  Topical with tetracaine drops augmented with 1% preservative-free intracameral lidocaine.  ESTIMATED BLOOD LOSS: <1 mL   COMPLICATIONS:  None.   DESCRIPTION OF PROCEDURE:  The patient was identified in the holding room and transported to the operating room and placed in the supine position under the operating microscope.  The left eye was identified as the operative eye and it was prepped and draped in the usual sterile ophthalmic fashion.   A 1.0 millimeter clear-corneal paracentesis was made at the 5:00 position. 0.5 ml of preservative-free 1% lidocaine with epinephrine was injected into the anterior chamber.  The anterior chamber was filled with Healon 5 viscoelastic.  A 2.4 millimeter keratome was used to make a near-clear corneal incision at the 2:00 position.  A curvilinear capsulorrhexis was made with a cystotome and capsulorrhexis forceps.  Balanced salt solution was used to hydrodissect and hydrodelineate the nucleus.   Phacoemulsification was then used in stop and chop  fashion to remove the lens nucleus and epinucleus.  The remaining cortex was then removed using the irrigation and aspiration handpiece. Healon was then placed into the capsular bag to distend it for lens placement.  A lens was then injected into the capsular bag.  The remaining viscoelastic was aspirated.   Wounds were hydrated with balanced salt solution.  The anterior chamber was inflated to a physiologic pressure with balanced salt solution.  Calipers were set to 3.5 mm and the eye was marked 3.5 mm superotemporal to the limbus.  A 27 ga needle was used to inject 0.10 mL of kenalog into the intravitreal cavity.  Intracameral vigamox 0.1 mL undiltued was injected into the eye and a drop placed onto the ocular surface.  No wound leaks were noted.  The patient was taken to the recovery room in stable condition without complications of anesthesia or surgery  Willey Blade 10/25/2019, 9:37 AM

## 2019-10-25 NOTE — Anesthesia Postprocedure Evaluation (Signed)
Anesthesia Post Note  Patient: Mitchell Washington  Procedure(s) Performed: CATARACT EXTRACTION PHACO AND INTRAOCULAR LENS PLACEMENT (IOC) LEFT DIABETIC INTRAVEITREAL KENALOG INJECTION 1.93  00:22.6 (Left Eye)     Patient location during evaluation: PACU Anesthesia Type: General Level of consciousness: awake and alert Pain management: pain level controlled Vital Signs Assessment: post-procedure vital signs reviewed and stable Respiratory status: spontaneous breathing, nonlabored ventilation, respiratory function stable and patient connected to nasal cannula oxygen Cardiovascular status: stable and blood pressure returned to baseline Postop Assessment: no apparent nausea or vomiting Anesthetic complications: no    Gayland Curry Judi Jaffe

## 2019-10-25 NOTE — H&P (Signed)

## 2019-10-25 NOTE — Transfer of Care (Signed)
Immediate Anesthesia Transfer of Care Note  Patient: Mitchell Washington  Procedure(s) Performed: CATARACT EXTRACTION PHACO AND INTRAOCULAR LENS PLACEMENT (IOC) LEFT DIABETIC INTRAVEITREAL KENALOG INJECTION 1.93  00:22.6 (Left Eye)  Patient Location: PACU  Anesthesia Type: General  Level of Consciousness: awake, alert  and patient cooperative  Airway and Oxygen Therapy: Patient Spontanous Breathing and Patient connected to supplemental oxygen  Post-op Assessment: Post-op Vital signs reviewed, Patient's Cardiovascular Status Stable, Respiratory Function Stable, Patent Airway and No signs of Nausea or vomiting  Post-op Vital Signs: Reviewed and stable  Complications: No apparent anesthesia complications

## 2019-10-25 NOTE — Anesthesia Preprocedure Evaluation (Addendum)
Anesthesia Evaluation  Patient identified by MRN, date of birth, ID band Patient awake    History of Anesthesia Complications Negative for: history of anesthetic complications  Airway Mallampati: II  TM Distance: >3 FB     Dental   Pulmonary former smoker,    Pulmonary exam normal        Cardiovascular hypertension, Normal cardiovascular exam     Neuro/Psych CVA, No Residual Symptoms    GI/Hepatic   Endo/Other  diabetes  Renal/GU      Musculoskeletal   Abdominal   Peds  Hematology   Anesthesia Other Findings   Reproductive/Obstetrics                             Anesthesia Physical Anesthesia Plan  ASA: III  Anesthesia Plan: MAC   Post-op Pain Management:    Induction: Intravenous  PONV Risk Score and Plan:   Airway Management Planned: Natural Airway  Additional Equipment:   Intra-op Plan:   Post-operative Plan:   Informed Consent: I have reviewed the patients History and Physical, chart, labs and discussed the procedure including the risks, benefits and alternatives for the proposed anesthesia with the patient or authorized representative who has indicated his/her understanding and acceptance.       Plan Discussed with: CRNA, Anesthesiologist and Surgeon  Anesthesia Plan Comments:        Anesthesia Quick Evaluation

## 2019-10-25 NOTE — Anesthesia Procedure Notes (Signed)
Procedure Name: MAC Performed by: Shailee Foots M, CRNA Pre-anesthesia Checklist: Timeout performed, Patient being monitored, Suction available, Emergency Drugs available and Patient identified Patient Re-evaluated:Patient Re-evaluated prior to induction Oxygen Delivery Method: Nasal cannula       

## 2019-10-26 ENCOUNTER — Encounter: Payer: Self-pay | Admitting: *Deleted

## 2019-11-04 ENCOUNTER — Encounter: Payer: Self-pay | Admitting: Ophthalmology

## 2019-11-04 ENCOUNTER — Other Ambulatory Visit: Payer: Self-pay

## 2019-11-10 NOTE — Anesthesia Preprocedure Evaluation (Addendum)
Anesthesia Evaluation  Patient identified by MRN, date of birth, ID band Patient awake    Reviewed: Allergy & Precautions, NPO status , Patient's Chart, lab work & pertinent test results  History of Anesthesia Complications Negative for: history of anesthetic complications  Airway Mallampati: II  TM Distance: >3 FB Neck ROM: Full    Dental  (+) Partial Upper   Pulmonary former smoker,    breath sounds clear to auscultation       Cardiovascular hypertension, (-) angina(-) DOE  Rhythm:Regular Rate:Normal   HLD   Neuro/Psych CVA, No Residual Symptoms    GI/Hepatic neg GERD  ,  Endo/Other  diabetes  Renal/GU CRFRenal disease     Musculoskeletal   Abdominal   Peds  Hematology   Anesthesia Other Findings Gout  Reproductive/Obstetrics                            Anesthesia Physical  Anesthesia Plan  ASA: III  Anesthesia Plan: MAC   Post-op Pain Management:    Induction: Intravenous  PONV Risk Score and Plan: 1 and TIVA, Midazolam and Treatment may vary due to age or medical condition  Airway Management Planned: Natural Airway  Additional Equipment:   Intra-op Plan:   Post-operative Plan:   Informed Consent: I have reviewed the patients History and Physical, chart, labs and discussed the procedure including the risks, benefits and alternatives for the proposed anesthesia with the patient or authorized representative who has indicated his/her understanding and acceptance.       Plan Discussed with: CRNA, Anesthesiologist and Surgeon  Anesthesia Plan Comments:         Anesthesia Quick Evaluation

## 2019-11-11 ENCOUNTER — Other Ambulatory Visit
Admission: RE | Admit: 2019-11-11 | Discharge: 2019-11-11 | Disposition: A | Discharge status: Home / Self Care | Payer: Medicare HMO | Source: Ambulatory Visit | Attending: Ophthalmology | Admitting: Ophthalmology

## 2019-11-11 ENCOUNTER — Other Ambulatory Visit: Payer: Self-pay

## 2019-11-11 DIAGNOSIS — Z20822 Contact with and (suspected) exposure to covid-19: Secondary | ICD-10-CM | POA: Insufficient documentation

## 2019-11-11 DIAGNOSIS — Z01812 Encounter for preprocedural laboratory examination: Secondary | ICD-10-CM | POA: Diagnosis present

## 2019-11-11 LAB — SARS CORONAVIRUS 2 (TAT 6-24 HRS): SARS Coronavirus 2: NEGATIVE

## 2019-11-11 NOTE — Discharge Instructions (Signed)

## 2019-11-15 ENCOUNTER — Other Ambulatory Visit: Payer: Self-pay

## 2019-11-15 ENCOUNTER — Encounter: Payer: Self-pay | Admitting: Ophthalmology

## 2019-11-15 ENCOUNTER — Ambulatory Visit: Payer: Medicare HMO | Admitting: Anesthesiology

## 2019-11-15 ENCOUNTER — Ambulatory Visit
Admission: RE | Admit: 2019-11-15 | Discharge: 2019-11-15 | Disposition: A | Discharge status: Home / Self Care | Payer: Medicare HMO | Attending: Ophthalmology | Admitting: Ophthalmology

## 2019-11-15 ENCOUNTER — Encounter
Admission: RE | Disposition: A | Discharge status: Home / Self Care | Payer: Self-pay | Source: Home / Self Care | Attending: Ophthalmology

## 2019-11-15 DIAGNOSIS — I10 Essential (primary) hypertension: Secondary | ICD-10-CM | POA: Diagnosis not present

## 2019-11-15 DIAGNOSIS — H2511 Age-related nuclear cataract, right eye: Secondary | ICD-10-CM | POA: Insufficient documentation

## 2019-11-15 DIAGNOSIS — Z79899 Other long term (current) drug therapy: Secondary | ICD-10-CM | POA: Insufficient documentation

## 2019-11-15 DIAGNOSIS — Z8673 Personal history of transient ischemic attack (TIA), and cerebral infarction without residual deficits: Secondary | ICD-10-CM | POA: Diagnosis not present

## 2019-11-15 DIAGNOSIS — Z87891 Personal history of nicotine dependence: Secondary | ICD-10-CM | POA: Diagnosis not present

## 2019-11-15 DIAGNOSIS — F0390 Unspecified dementia without behavioral disturbance: Secondary | ICD-10-CM | POA: Insufficient documentation

## 2019-11-15 DIAGNOSIS — E7800 Pure hypercholesterolemia, unspecified: Secondary | ICD-10-CM | POA: Diagnosis not present

## 2019-11-15 DIAGNOSIS — E1136 Type 2 diabetes mellitus with diabetic cataract: Secondary | ICD-10-CM | POA: Diagnosis not present

## 2019-11-15 DIAGNOSIS — Z7984 Long term (current) use of oral hypoglycemic drugs: Secondary | ICD-10-CM | POA: Diagnosis not present

## 2019-11-15 DIAGNOSIS — Z7982 Long term (current) use of aspirin: Secondary | ICD-10-CM | POA: Diagnosis not present

## 2019-11-15 HISTORY — PX: CATARACT EXTRACTION W/PHACO: SHX586

## 2019-11-15 LAB — GLUCOSE, CAPILLARY
Glucose-Capillary: 151 mg/dL — ABNORMAL HIGH (ref 70–99)
Glucose-Capillary: 146 mg/dL — ABNORMAL HIGH (ref 70–99)

## 2019-11-15 SURGERY — PHACOEMULSIFICATION, CATARACT, WITH IOL INSERTION
Anesthesia: Monitor Anesthesia Care | Site: Eye | Laterality: Right

## 2019-11-15 MED ORDER — EPINEPHRINE PF 1 MG/ML IJ SOLN
INTRAOCULAR | Status: DC | PRN
  Administered 2019-11-15: 81 mL via OPHTHALMIC

## 2019-11-15 MED ORDER — MOXIFLOXACIN HCL 0.5 % OP SOLN
OPHTHALMIC | Status: DC | PRN
  Administered 2019-11-15: 0.2 mL via OPHTHALMIC

## 2019-11-15 MED ORDER — TETRACAINE HCL 0.5 % OP SOLN
1.0000 [drp] | OPHTHALMIC | Status: DC | PRN
  Administered 2019-11-15 (×3): 1 [drp] via OPHTHALMIC

## 2019-11-15 MED ORDER — FENTANYL CITRATE (PF) 100 MCG/2ML IJ SOLN
INTRAMUSCULAR | Status: DC | PRN
  Administered 2019-11-15: 100 ug via INTRAVENOUS

## 2019-11-15 MED ORDER — MIDAZOLAM HCL 2 MG/2ML IJ SOLN
INTRAMUSCULAR | Status: DC | PRN
  Administered 2019-11-15: 2 mg via INTRAVENOUS

## 2019-11-15 MED ORDER — LIDOCAINE HCL (PF) 2 % IJ SOLN
INTRAOCULAR | Status: DC | PRN
  Administered 2019-11-15: 1 mL via INTRAOCULAR

## 2019-11-15 MED ORDER — SODIUM HYALURONATE 23 MG/ML IO SOLN
INTRAOCULAR | Status: DC | PRN
  Administered 2019-11-15: 0.6 mL via INTRAOCULAR

## 2019-11-15 MED ORDER — ARMC OPHTHALMIC DILATING DROPS
1.0000 "application " | OPHTHALMIC | Status: DC | PRN
  Administered 2019-11-15 (×3): 1 via OPHTHALMIC

## 2019-11-15 MED ORDER — LACTATED RINGERS IV SOLN: 100.0000 mL/h | INTRAVENOUS | Status: DC

## 2019-11-15 MED ORDER — ONDANSETRON HCL 4 MG/2ML IJ SOLN: 4.0000 mg | Freq: Once | INTRAMUSCULAR | Status: DC | PRN

## 2019-11-15 MED ORDER — SODIUM HYALURONATE 10 MG/ML IO SOLN
INTRAOCULAR | Status: DC | PRN
  Administered 2019-11-15: 0.55 mL via INTRAOCULAR

## 2019-11-15 MED ORDER — ACETAMINOPHEN 10 MG/ML IV SOLN: 1000.0000 mg | Freq: Once | INTRAVENOUS | Status: DC | PRN

## 2019-11-15 SURGICAL SUPPLY — 19 items
CANNULA ANT/CHMB 27G (MISCELLANEOUS) ×2 IMPLANT
CANNULA ANT/CHMB 27GA (MISCELLANEOUS) ×4 IMPLANT
DISSECTOR HYDRO NUCLEUS 50X22 (MISCELLANEOUS) ×2 IMPLANT
GLOVE SURG LX 7.5 STRW (GLOVE) ×2
GLOVE SURG LX STRL 7.5 STRW (GLOVE) ×1 IMPLANT
GLOVE SURG SYN 8.5  E (GLOVE) ×1
GLOVE SURG SYN 8.5 E (GLOVE) ×1 IMPLANT
GLOVE SURG SYN 8.5 PF PI (GLOVE) ×1 IMPLANT
GOWN STRL REUS W/ TWL LRG LVL3 (GOWN DISPOSABLE) ×2 IMPLANT
GOWN STRL REUS W/TWL LRG LVL3 (GOWN DISPOSABLE) ×2
LENS IOL TECNIS ITEC 14.5 (Intraocular Lens) ×1 IMPLANT
MARKER SKIN DUAL TIP RULER LAB (MISCELLANEOUS) ×2 IMPLANT
PACK DR. KING ARMS (PACKS) ×2 IMPLANT
PACK EYE AFTER SURG (MISCELLANEOUS) ×2 IMPLANT
PACK OPTHALMIC (MISCELLANEOUS) ×2 IMPLANT
SYR 3ML LL SCALE MARK (SYRINGE) ×2 IMPLANT
SYR TB 1ML LUER SLIP (SYRINGE) ×2 IMPLANT
WATER STERILE IRR 250ML POUR (IV SOLUTION) ×2 IMPLANT
WIPE NON LINTING 3.25X3.25 (MISCELLANEOUS) ×2 IMPLANT

## 2019-11-15 NOTE — Transfer of Care (Signed)
Immediate Anesthesia Transfer of Care Note  Patient: Mitchell Washington  Procedure(s) Performed: CATARACT EXTRACTION PHACO AND INTRAOCULAR LENS PLACEMENT (IOC) RIGHT DIABETIC (Right Eye)  Patient Location: PACU  Anesthesia Type: MAC  Level of Consciousness: awake, alert  and patient cooperative  Airway and Oxygen Therapy: Patient Spontanous Breathing and Patient connected to supplemental oxygen  Post-op Assessment: Post-op Vital signs reviewed, Patient's Cardiovascular Status Stable, Respiratory Function Stable, Patent Airway and No signs of Nausea or vomiting  Post-op Vital Signs: Reviewed and stable  Complications: No apparent anesthesia complications

## 2019-11-15 NOTE — Op Note (Signed)
OPERATIVE NOTE  LEVERETT CAMPLIN [id number] 11/15/2019   PREOPERATIVE DIAGNOSIS:  Nuclear sclerotic cataract right eye.  H25.11   POSTOPERATIVE DIAGNOSIS:    Nuclear sclerotic cataract right eye.     PROCEDURE:  Phacoemusification with posterior chamber intraocular lens placement of the right eye   LENS:   Implant Name Type Inv. Item Serial No. Manufacturer Lot No. LRB No. Used Action  LENS IOL DIOP 14.5 - [id number] Intraocular Lens LENS IOL DIOP 14.5 [id number]  Right 1 Implanted       Procedure(s) with comments: CATARACT EXTRACTION PHACO AND INTRAOCULAR LENS PLACEMENT (IOC) RIGHT DIABETIC (Right) - 0.92 0 ;19.7  PCB00 +14.5   ULTRASOUND TIME: 0 minutes 19 seconds.  CDE 0.92   SURGEON:  Willey Blade, MD, MPH  ANESTHESIOLOGIST: Anesthesiologist: Heniser, Burman Foster, MD CRNA: Michaele Offer, CRNA   ANESTHESIA:  Topical with tetracaine drops augmented with 1% preservative-free intracameral lidocaine.  ESTIMATED BLOOD LOSS: less than 1 mL.   COMPLICATIONS:  None.   DESCRIPTION OF PROCEDURE:  The patient was identified in the holding room and transported to the operating room and placed in the supine position under the operating microscope.  The right eye was identified as the operative eye and it was prepped and draped in the usual sterile ophthalmic fashion.   A 1.0 millimeter clear-corneal paracentesis was made at the 10:30 position. 0.5 ml of preservative-free 1% lidocaine with epinephrine was injected into the anterior chamber.  The anterior chamber was filled with Healon 5 viscoelastic.  A 2.4 millimeter keratome was used to make a near-clear corneal incision at the 8:00 position.  A curvilinear capsulorrhexis was made with a cystotome and capsulorrhexis forceps.  Balanced salt solution was used to hydrodissect and hydrodelineate the nucleus.   Phacoemulsification was then used in stop and chop fashion to remove the lens nucleus and epinucleus.  The remaining cortex was  then removed using the irrigation and aspiration handpiece. Healon was then placed into the capsular bag to distend it for lens placement.  A lens was then injected into the capsular bag.  The remaining viscoelastic was aspirated.   Wounds were hydrated with balanced salt solution.  The anterior chamber was inflated to a physiologic pressure with balanced salt solution.   Intracameral vigamox 0.1 mL undiluted was injected into the eye and a drop placed onto the ocular surface.  No wound leaks were noted.  The patient was taken to the recovery room in stable condition without complications of anesthesia or surgery  Willey Blade 11/15/2019, 7:53 AM

## 2019-11-15 NOTE — Anesthesia Postprocedure Evaluation (Signed)
Anesthesia Post Note  Patient: Mitchell Washington  Procedure(s) Performed: CATARACT EXTRACTION PHACO AND INTRAOCULAR LENS PLACEMENT (IOC) RIGHT DIABETIC (Right Eye)     Patient location during evaluation: PACU Anesthesia Type: MAC Level of consciousness: awake and alert Pain management: pain level controlled Vital Signs Assessment: post-procedure vital signs reviewed and stable Respiratory status: spontaneous breathing, nonlabored ventilation, respiratory function stable and patient connected to nasal cannula oxygen Cardiovascular status: stable and blood pressure returned to baseline Postop Assessment: no apparent nausea or vomiting Anesthetic complications: no    Fatoumata Albaugh A  Junnie Loschiavo

## 2019-11-15 NOTE — H&P (Signed)

## 2019-11-16 ENCOUNTER — Encounter: Payer: Self-pay | Admitting: *Deleted

## 2020-01-12 ENCOUNTER — Encounter: Payer: Self-pay | Admitting: *Deleted

## 2020-01-13 ENCOUNTER — Other Ambulatory Visit: Payer: Self-pay

## 2020-01-13 ENCOUNTER — Ambulatory Visit: Payer: Medicare HMO | Admitting: Podiatry

## 2020-01-13 ENCOUNTER — Encounter: Payer: Self-pay | Admitting: Podiatry

## 2020-01-13 DIAGNOSIS — B351 Tinea unguium: Secondary | ICD-10-CM | POA: Diagnosis not present

## 2020-01-13 DIAGNOSIS — M79675 Pain in left toe(s): Secondary | ICD-10-CM

## 2020-01-13 DIAGNOSIS — E1151 Type 2 diabetes mellitus with diabetic peripheral angiopathy without gangrene: Secondary | ICD-10-CM | POA: Diagnosis not present

## 2020-01-13 DIAGNOSIS — N183 Chronic kidney disease, stage 3 unspecified: Secondary | ICD-10-CM | POA: Diagnosis not present

## 2020-01-13 DIAGNOSIS — M79674 Pain in right toe(s): Secondary | ICD-10-CM

## 2020-01-13 NOTE — Progress Notes (Signed)
This patient returns to my office for at risk foot care.  This patient requires this care by a professional since this patient will be at risk due to having  diabetes.  This patient is unable to cut nails himself since the patient cannot reach his nails.These nails are painful walking and wearing shoes.  This patient presents for at risk foot care today.  General Appearance  Alert, conversant and in no acute stress.  Vascular  Dorsalis pedis and posterior tibial  pulses are palpable  bilaterally.  Capillary return is within normal limits  bilaterally. Temperature is within normal limits  bilaterally.  Neurologic  Senn-Weinstein monofilament wire test within normal limits  bilaterally. Muscle power within normal limits bilaterally.  Nails Thick disfigured discolored nails with subungual debris  from hallux to fifth toes bilaterally. No evidence of bacterial infection or drainage bilaterally.  Orthopedic  No limitations of motion  feet .  No crepitus or effusions noted.  No bony pathology or digital deformities noted.  Skin  normotropic skin with no porokeratosis noted bilaterally.  No signs of infections or ulcers noted.     Onychomycosis  Pain in right toes  Pain in left toes  Consent was obtained for treatment procedures.   Mechanical debridement of nails 1-5  bilaterally performed with a nail nipper.  Filed with dremel without incident.    Return office visit   3 months                   Told patient to return for periodic foot care and evaluation due to potential at risk complications.   Nicholi Ghuman DPM   

## 2020-04-17 ENCOUNTER — Encounter: Payer: Self-pay | Admitting: Podiatry

## 2020-04-17 ENCOUNTER — Ambulatory Visit: Payer: Medicare HMO | Admitting: Podiatry

## 2020-04-17 ENCOUNTER — Other Ambulatory Visit: Payer: Self-pay

## 2020-04-17 DIAGNOSIS — E1151 Type 2 diabetes mellitus with diabetic peripheral angiopathy without gangrene: Secondary | ICD-10-CM

## 2020-04-17 DIAGNOSIS — M79675 Pain in left toe(s): Secondary | ICD-10-CM | POA: Diagnosis not present

## 2020-04-17 DIAGNOSIS — N183 Chronic kidney disease, stage 3 unspecified: Secondary | ICD-10-CM | POA: Diagnosis not present

## 2020-04-17 DIAGNOSIS — B351 Tinea unguium: Secondary | ICD-10-CM

## 2020-04-17 DIAGNOSIS — M79674 Pain in right toe(s): Secondary | ICD-10-CM

## 2020-04-17 NOTE — Progress Notes (Signed)
This patient returns to my office for at risk foot care.  This patient requires this care by a professional since this patient will be at risk due to having diabetes and CKD.  This patient is unable to cut nails himself since the patient cannot reach his nails.These nails are painful walking and wearing shoes.  This patient presents for at risk foot care today.  General Appearance  Alert, conversant and in no acute stress.  Vascular  Dorsalis pedis and posterior tibial  pulses are palpable  bilaterally.  Capillary return is within normal limits  bilaterally. Temperature is within normal limits  bilaterally.  Neurologic  Senn-Weinstein monofilament wire test within normal limits  bilaterally. Muscle power within normal limits bilaterally.  Nails Thick disfigured discolored nails with subungual debris  from hallux to fifth toes bilaterally. No evidence of bacterial infection or drainage bilaterally.  Orthopedic  No limitations of motion  feet .  No crepitus or effusions noted.  No bony pathology or digital deformities noted.  Skin  normotropic skin with no porokeratosis noted bilaterally.  No signs of infections or ulcers noted.     Onychomycosis  Pain in right toes  Pain in left toes  Consent was obtained for treatment procedures.   Mechanical debridement of nails 1-5  bilaterally performed with a nail nipper.  Filed with dremel without incident.    Return office visit   3 months                   Told patient to return for periodic foot care and evaluation due to potential at risk complications.   Shaelyn Decarli DPM   

## 2020-05-07 LAB — COLOGUARD: COLOGUARD: NEGATIVE

## 2020-07-20 ENCOUNTER — Ambulatory Visit: Payer: Medicare HMO | Admitting: Podiatry

## 2020-07-20 ENCOUNTER — Other Ambulatory Visit: Payer: Self-pay

## 2020-07-20 ENCOUNTER — Encounter: Payer: Self-pay | Admitting: Podiatry

## 2020-07-20 DIAGNOSIS — M79674 Pain in right toe(s): Secondary | ICD-10-CM

## 2020-07-20 DIAGNOSIS — M79675 Pain in left toe(s): Secondary | ICD-10-CM | POA: Diagnosis not present

## 2020-07-20 DIAGNOSIS — N183 Chronic kidney disease, stage 3 unspecified: Secondary | ICD-10-CM | POA: Diagnosis not present

## 2020-07-20 DIAGNOSIS — E1151 Type 2 diabetes mellitus with diabetic peripheral angiopathy without gangrene: Secondary | ICD-10-CM | POA: Diagnosis not present

## 2020-07-20 DIAGNOSIS — B351 Tinea unguium: Secondary | ICD-10-CM

## 2020-07-20 NOTE — Progress Notes (Signed)
This patient returns to my office for at risk foot care.  This patient requires this care by a professional since this patient will be at risk due to having diabetes and CKD.  This patient is unable to cut nails himself since the patient cannot reach his nails.These nails are painful walking and wearing shoes.  This patient presents for at risk foot care today.  General Appearance  Alert, conversant and in no acute stress.  Vascular  Dorsalis pedis and posterior tibial  pulses are palpable  bilaterally.  Capillary return is within normal limits  bilaterally. Temperature is within normal limits  bilaterally.  Neurologic  Senn-Weinstein monofilament wire test within normal limits  bilaterally. Muscle power within normal limits bilaterally.  Nails Thick disfigured discolored nails with subungual debris  from hallux to fifth toes bilaterally. No evidence of bacterial infection or drainage bilaterally.  Orthopedic  No limitations of motion  feet .  No crepitus or effusions noted.  No bony pathology or digital deformities noted.  Skin  normotropic skin with no porokeratosis noted bilaterally.  No signs of infections or ulcers noted.     Onychomycosis  Pain in right toes  Pain in left toes  Consent was obtained for treatment procedures.   Mechanical debridement of nails 1-5  bilaterally performed with a nail nipper.  Filed with dremel without incident.    Return office visit   3 months                   Told patient to return for periodic foot care and evaluation due to potential at risk complications.   Deaven Urwin DPM   

## 2020-10-19 ENCOUNTER — Ambulatory Visit: Payer: Medicare HMO | Admitting: Podiatry

## 2020-10-23 ENCOUNTER — Other Ambulatory Visit: Payer: Self-pay

## 2020-10-23 ENCOUNTER — Encounter: Payer: Self-pay | Admitting: Podiatry

## 2020-10-23 ENCOUNTER — Ambulatory Visit: Payer: Medicare HMO | Admitting: Podiatry

## 2020-10-23 DIAGNOSIS — E1151 Type 2 diabetes mellitus with diabetic peripheral angiopathy without gangrene: Secondary | ICD-10-CM

## 2020-10-23 DIAGNOSIS — N183 Chronic kidney disease, stage 3 unspecified: Secondary | ICD-10-CM | POA: Diagnosis not present

## 2020-10-23 DIAGNOSIS — B351 Tinea unguium: Secondary | ICD-10-CM

## 2020-10-23 DIAGNOSIS — M79675 Pain in left toe(s): Secondary | ICD-10-CM | POA: Diagnosis not present

## 2020-10-23 DIAGNOSIS — M79674 Pain in right toe(s): Secondary | ICD-10-CM

## 2020-10-23 NOTE — Progress Notes (Signed)
This patient returns to my office for at risk foot care.  This patient requires this care by a professional since this patient will be at risk due to having diabetes and CKD.  This patient is unable to cut nails himself since the patient cannot reach his nails.These nails are painful walking and wearing shoes.  This patient presents for at risk foot care today.  General Appearance  Alert, conversant and in no acute stress.  Vascular  Dorsalis pedis and posterior tibial  pulses are palpable  bilaterally.  Capillary return is within normal limits  bilaterally. Temperature is within normal limits  bilaterally.  Neurologic  Senn-Weinstein monofilament wire test within normal limits  bilaterally. Muscle power within normal limits bilaterally.  Nails Thick disfigured discolored nails with subungual debris  from hallux to fifth toes bilaterally. No evidence of bacterial infection or drainage bilaterally.  Orthopedic  No limitations of motion  feet .  No crepitus or effusions noted.  No bony pathology or digital deformities noted.  Skin  normotropic skin with no porokeratosis noted bilaterally.  No signs of infections or ulcers noted.     Onychomycosis  Pain in right toes  Pain in left toes  Consent was obtained for treatment procedures.   Mechanical debridement of nails 1-5  bilaterally performed with a nail nipper.  Filed with dremel without incident.    Return office visit   3 months                   Told patient to return for periodic foot care and evaluation due to potential at risk complications.   Abbie Jablon DPM   

## 2021-01-21 ENCOUNTER — Emergency Department
Admission: EM | Admit: 2021-01-21 | Discharge: 2021-01-21 | Disposition: A | Discharge status: Home / Self Care | Payer: Medicare HMO | Attending: Emergency Medicine | Admitting: Emergency Medicine

## 2021-01-21 ENCOUNTER — Other Ambulatory Visit: Payer: Self-pay

## 2021-01-21 ENCOUNTER — Emergency Department: Payer: Medicare HMO

## 2021-01-21 DIAGNOSIS — F0390 Unspecified dementia without behavioral disturbance: Secondary | ICD-10-CM | POA: Diagnosis not present

## 2021-01-21 DIAGNOSIS — Z79899 Other long term (current) drug therapy: Secondary | ICD-10-CM | POA: Diagnosis not present

## 2021-01-21 DIAGNOSIS — Z8673 Personal history of transient ischemic attack (TIA), and cerebral infarction without residual deficits: Secondary | ICD-10-CM | POA: Diagnosis not present

## 2021-01-21 DIAGNOSIS — U071 COVID-19: Secondary | ICD-10-CM

## 2021-01-21 DIAGNOSIS — E1140 Type 2 diabetes mellitus with diabetic neuropathy, unspecified: Secondary | ICD-10-CM | POA: Diagnosis not present

## 2021-01-21 DIAGNOSIS — N183 Chronic kidney disease, stage 3 unspecified: Secondary | ICD-10-CM | POA: Insufficient documentation

## 2021-01-21 DIAGNOSIS — E1122 Type 2 diabetes mellitus with diabetic chronic kidney disease: Secondary | ICD-10-CM | POA: Insufficient documentation

## 2021-01-21 DIAGNOSIS — I129 Hypertensive chronic kidney disease with stage 1 through stage 4 chronic kidney disease, or unspecified chronic kidney disease: Secondary | ICD-10-CM | POA: Insufficient documentation

## 2021-01-21 DIAGNOSIS — Z7984 Long term (current) use of oral hypoglycemic drugs: Secondary | ICD-10-CM | POA: Diagnosis not present

## 2021-01-21 DIAGNOSIS — R410 Disorientation, unspecified: Secondary | ICD-10-CM | POA: Diagnosis present

## 2021-01-21 DIAGNOSIS — Z7982 Long term (current) use of aspirin: Secondary | ICD-10-CM | POA: Diagnosis not present

## 2021-01-21 DIAGNOSIS — Z87891 Personal history of nicotine dependence: Secondary | ICD-10-CM | POA: Diagnosis not present

## 2021-01-21 LAB — CBC
HCT: 40.7 % (ref 39.0–52.0)
Hemoglobin: 13.3 g/dL (ref 13.0–17.0)
MCH: 27.3 pg (ref 26.0–34.0)
MCHC: 32.7 g/dL (ref 30.0–36.0)
MCV: 83.4 fL (ref 80.0–100.0)
Platelets: 272 10*3/uL (ref 150–400)
RBC: 4.88 MIL/uL (ref 4.22–5.81)
RDW: 13.6 % (ref 11.5–15.5)
WBC: 8.1 10*3/uL (ref 4.0–10.5)
nRBC: 0 % (ref 0.0–0.2)

## 2021-01-21 LAB — URINALYSIS, COMPLETE (UACMP) WITH MICROSCOPIC
Bacteria, UA: NONE SEEN
Bilirubin Urine: NEGATIVE
Glucose, UA: >=500 mg/dL — AB
Ketones, ur: NEGATIVE mg/dL
Leukocytes,Ua: NEGATIVE
Nitrite: NEGATIVE
Protein, ur: 100 mg/dL — AB
Specific Gravity, Urine: 1.022 (ref 1.005–1.030)
Squamous Epithelial / HPF: NONE SEEN (ref 0–5)
pH: 5 (ref 5.0–8.0)

## 2021-01-21 LAB — RESP PANEL BY RT-PCR (FLU A&B, COVID) ARPGX2
Influenza A by PCR: NEGATIVE
Influenza B by PCR: NEGATIVE
SARS Coronavirus 2 by RT PCR: POSITIVE — AB

## 2021-01-21 LAB — BASIC METABOLIC PANEL
Anion gap: 10 (ref 5–15)
BUN: 28 mg/dL — ABNORMAL HIGH (ref 8–23)
CO2: 23 mmol/L (ref 22–32)
Calcium: 8.7 mg/dL — ABNORMAL LOW (ref 8.9–10.3)
Chloride: 104 mmol/L (ref 98–111)
Creatinine, Ser: 2.26 mg/dL — ABNORMAL HIGH (ref 0.61–1.24)
GFR, Estimated: 31 mL/min — ABNORMAL LOW (ref >60)
Glucose, Bld: 162 mg/dL — ABNORMAL HIGH (ref 70–99)
Potassium: 4.3 mmol/L (ref 3.5–5.1)
Sodium: 137 mmol/L (ref 135–145)

## 2021-01-21 LAB — LACTIC ACID, PLASMA: Lactic Acid, Venous: 1.6 mmol/L (ref 0.5–1.9)

## 2021-01-21 MED ORDER — LACTATED RINGERS IV BOLUS
1000.0000 mL | Freq: Once | INTRAVENOUS | Status: AC
  Administered 2021-01-21: 1000 mL via INTRAVENOUS

## 2021-01-21 MED ORDER — FAMOTIDINE 20 MG PO TABS: 20.0000 mg | ORAL_TABLET | Freq: Two times a day (BID) | ORAL | 0 refills | Status: DC

## 2021-01-21 MED ORDER — PAXLOVID 20 X 150 MG & 10 X 100MG PO TBPK: 3.0000 | ORAL_TABLET | Freq: Two times a day (BID) | ORAL | 0 refills | Status: AC

## 2021-01-21 MED ORDER — ONDANSETRON 4 MG PO TBDP: 4.0000 mg | ORAL_TABLET | Freq: Three times a day (TID) | ORAL | 0 refills | Status: DC | PRN

## 2021-01-21 MED ORDER — ACETAMINOPHEN 500 MG PO TABS
1000.0000 mg | ORAL_TABLET | Freq: Once | ORAL | Status: AC
  Administered 2021-01-21: 1000 mg via ORAL
  Filled 2021-01-21: qty 2

## 2021-01-21 NOTE — ED Provider Notes (Signed)
[facility] Emergency Department Provider Note  ____________________________________________  Time seen: Approximately 7:27 PM  I have reviewed the triage vital signs and the nursing notes.   HISTORY  Chief Complaint Seizures    Level 5 Caveat: Portions of the History and Physical including HPI and review of systems are unable to be completely obtained due to dementia  HPI Mitchell Washington is a 70 y.o. male with a history of stroke diabetes hypertension hyperlipidemia and dementia who is brought to the ED today due to a suspected seizure.    History primarily obtained from wife at bedside.  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  No incontinence, no significant injury.  He had a second episode while at the church, but now has returned to his baseline mental status which does include confusion.  Patient denies any acute complaints.      Past Medical History:  Diagnosis Date  . CVA (cerebral infarction)   . Diabetes mellitus without complication ([place])   . Gout   . Hyperlipidemia   . Hypertension   . Memory loss   . Stroke ([place])   . Wears dentures    partial upper     Patient Active Problem List   Diagnosis Date Noted  . B12 deficiency 09/06/2019  . Loss of memory 03/24/2018  . Benign essential hypertension 01/15/2018  . Cerebrovascular disease 01/15/2018  . CKD (chronic kidney disease) stage 3, GFR 30-59 ml/min (HCC) 01/15/2018  . Pure hypercholesterolemia 01/15/2018  . Type 2 diabetes mellitus with peripheral neuropathy ([place]) 01/15/2018     Past Surgical History:  Procedure Laterality Date  . APPENDECTOMY    . CATARACT EXTRACTION W/PHACO Left 10/25/2019   Procedure: CATARACT EXTRACTION PHACO AND INTRAOCULAR LENS PLACEMENT (IOC) LEFT DIABETIC INTRAVEITREAL KENALOG INJECTION 1.93   00:22.6;  Surgeon: Eulogio Bear, MD;  Location: [place];  Service: Ophthalmology;  Laterality: Left;  Diabetic - oral meds  . CATARACT EXTRACTION W/PHACO Right 11/15/2019   Procedure: CATARACT EXTRACTION PHACO AND INTRAOCULAR LENS PLACEMENT ([place]) RIGHT DIABETIC;  Surgeon: Eulogio Bear, MD;  Location: [place];  Service: Ophthalmology;  Laterality: Right;  0.92 0 ;19.7     Prior to Admission medications   Medication Sig Start Date End Date Taking? Authorizing Provider  famotidine (PEPCID) 20 MG tablet Take 1 tablet (20 mg total) by mouth 2 (two) times daily. 01/21/21  Yes Carrie Mew, MD  Nirmatrelvir & Ritonavir (PAXLOVID) 20 x 150 MG & 10 x 100MG TBPK Take 3 tablets by mouth in the morning and at bedtime for 5 days. Take according to package instruction 01/21/21 01/26/21 Yes Carrie Mew, MD  ondansetron (ZOFRAN ODT) 4 MG disintegrating tablet Take 1 tablet (4 mg total) by mouth every 8 (eight) hours as needed for nausea or vomiting. 01/21/21  Yes Carrie Mew, MD  amLODipine (NORVASC) 5 MG tablet Take 5 mg by mouth daily. 01/10/20   [provider]  aspirin EC 81 MG tablet Take by mouth.    [provider]  atorvastatin (LIPITOR) 10 MG tablet TAKE 1 TABLET BY MOUTH EVERY DAY 07/06/18   [provider]  Blood Glucose Monitoring Suppl ([place]) [place] 1 each by XX route as directed 12/15/18   [provider]  Blood Glucose Monitoring Suppl (ONE [facility]  ULTRA 2) w/Device KIT See admin instructions. 12/15/18   [provider]  [employer] VITAMIN B12 1000 MCG tablet Take 1,000 mcg by mouth daily. 09/21/18   [provider]  Cysteamine Bitartrate (PROCYSBI) 300 MG PACK Use 1 each once daily Use as instructed. 12/15/18   [provider]  donepezil (ARICEPT) 10 MG tablet  08/27/18   [provider]  glipiZIDE (GLUCOTROL XL) 2.5 MG 24 hr tablet Take by mouth. 01/03/20 01/02/21  [provider]  Lancets (ONETOUCH DELICA PLUS [id number]) [place] USE 1 [place] USE AS INSTRUCTED. 03/17/19   [provider]  lisinopril (PRINIVIL,ZESTRIL) 20 MG tablet TAKE 1 TABLET BY MOUTH EVERY DAY 07/06/18   [provider]  memantine (NAMENDA) 10 MG tablet Take 10 mg by mouth 2 (two) times daily. 12/20/19   [provider]  metFORMIN (GLUCOPHAGE-XR) 500 MG 24 hr tablet Take by mouth. 01/15/18 11/04/19  [provider]  ONETOUCH ULTRA test strip USE 1 EACH (1 STRIP TOTAL) ONCE DAILY 03/13/19   [provider]     Allergies Patient has no known allergies.   Family History  Problem Relation Age of Onset  . Diabetes Mother     Social History Social History   Tobacco Use  . Smoking status: Former Research scientist (life sciences)  . Smokeless tobacco: Never Used  Vaping Use  . Vaping Use: Never used  Substance Use Topics  . Alcohol use: Not Currently    Alcohol/week: 0.0 standard drinks  . Drug use: Never    Review of Systems Level 5 Caveat: Portions of the History and Physical including HPI and review of systems are unable to be completely obtained due to patient being a poor historian   Constitutional:   Positive fever.  ENT:   No rhinorrhea. Cardiovascular:   No chest pain or syncope. Respiratory:   No dyspnea or cough. Gastrointestinal:   Negative for abdominal pain, vomiting and diarrhea.  Musculoskeletal:   Negative for focal pain or swelling ____________________________________________   PHYSICAL EXAM:  VITAL SIGNS: ED Triage Vitals  Enc Vitals Group     BP 01/21/21 1306 134/73     Pulse Rate 01/21/21 1306 82     Resp 01/21/21 1306 20     Temp 01/21/21 1306 100.3 F (37.9 C)     Temp Source 01/21/21 1306 Oral     SpO2 01/21/21 1306 97 %     Weight 01/21/21 1307 170 lb (77.1 kg)     Height 01/21/21 1307 '5\' 9"'  (1.753 m)     Head Circumference --      Peak Flow --      Pain Score 01/21/21 1307 0     Pain Loc --      Pain Edu? --       Excl. in [place]? --     Vital signs reviewed, nursing assessments reviewed.   Constitutional:   Alert and oriented to person and place. Non-toxic appearance.  Patient is in good spirits Eyes:   Conjunctivae are normal. EOMI. PERRL. ENT      Head:   Normocephalic and atraumatic.      Nose:   No congestion/rhinnorhea.       Mouth/Throat:   MMM, no pharyngeal erythema. No peritonsillar mass.       Neck:   No meningismus. Full ROM. Hematological/Lymphatic/Immunilogical:   No cervical lymphadenopathy. Cardiovascular:   RRR. Symmetric bilateral radial and DP pulses.  No murmurs. Cap refill less than 2 seconds. Respiratory:  Normal respiratory effort without tachypnea/retractions. Breath sounds are clear and equal bilaterally. No wheezes/rales/rhonchi. Gastrointestinal:   Soft and nontender. Non distended. There is no CVA tenderness.  No rebound, rigidity, or guarding. Genitourinary:   deferred Musculoskeletal:   Normal range of motion in all extremities. No joint effusions.  No lower extremity tenderness.  No edema. Neurologic:   Normal speech and language.  Motor grossly intact. No acute focal neurologic deficits are appreciated.  Skin:    Skin is warm, dry and intact. No rash noted.  No petechiae, purpura, or bullae.  ____________________________________________    LABS (pertinent positives/negatives) (all labs ordered are listed, but only abnormal results are displayed) Labs Reviewed  RESP PANEL BY RT-PCR (FLU A&B, COVID) ARPGX2 - Abnormal; Notable for the following components:      Result Value   SARS Coronavirus 2 by RT PCR POSITIVE (*)    All other components within normal limits  BASIC METABOLIC PANEL - Abnormal; Notable for the following components:   Glucose, Bld 162 (*)    BUN 28 (*)    Creatinine, Ser 2.26 (*)    Calcium 8.7 (*)    GFR, Estimated 31 (*)    All other components within normal limits  URINALYSIS, COMPLETE (UACMP) WITH MICROSCOPIC - Abnormal; Notable for the  following components:   Color, Urine YELLOW (*)    APPearance HAZY (*)    Glucose, UA >=500 (*)    Hgb urine dipstick SMALL (*)    Protein, ur 100 (*)    All other components within normal limits  CBC  LACTIC ACID, PLASMA   ____________________________________________   EKG    ____________________________________________    RADIOLOGY  DG Chest 2 View  Result Date: 01/21/2021 CLINICAL DATA:  Cough, seizure x 2 today, fever since Friday, confusion, baseline dementia EXAM: CHEST - 2 VIEW COMPARISON:  10/11/2018 FINDINGS: Normal heart size, mediastinal contours, and pulmonary vascularity. Atherosclerotic calcification aorta. Lungs clear. No pulmonary infiltrate, pleural effusion, or pneumothorax. No acute osseous findings. IMPRESSION: No acute abnormalities. Aortic Atherosclerosis (ICD10-I70.0). Electronically Signed   By: Lavonia Dana M.D.   On: 01/21/2021 13:32   CT Head Wo Contrast  Result Date: 01/21/2021 CLINICAL DATA:  Seizure EXAM: CT HEAD WITHOUT CONTRAST TECHNIQUE: Contiguous axial images were obtained from the base of the skull through the vertex without intravenous contrast. COMPARISON:  None. FINDINGS: Brain: There is no mass, hemorrhage or extra-axial collection. The size and configuration of the ventricles and extra-axial CSF spaces are normal. There is hypoattenuation of the white matter, most commonly indicating chronic small vessel disease. Old infarcts of the left basal ganglia and left occipital lobe Vascular: Atherosclerotic calcification of the internal carotid arteries at the skull base. No abnormal hyperdensity of the major intracranial arteries or dural venous sinuses. Skull: The visualized skull base, calvarium and extracranial soft tissues are normal. Sinuses/Orbits: No fluid levels or advanced mucosal thickening of the visualized paranasal sinuses. No mastoid or middle ear effusion. The orbits are normal. IMPRESSION: 1. No acute intracranial abnormality. 2. 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 3. Chronic small vessel disease. Electronically Signed   By: Ulyses Jarred M.D.   On: 01/21/2021 20:34    ____________________________________________   PROCEDURES Procedures  ____________________________________________  DIFFERENTIAL DIAGNOSIS   Intracranial hemorrhage, intracranial mass, new onset epilepsy, electrolyte abnormality, dehydration, viral illness, UTI, pneumonia  CLINICAL IMPRESSION / ASSESSMENT AND PLAN / ED COURSE  Medications ordered in the ED: Medications  lactated ringers bolus  1,000 mL (0 mLs Intravenous Stopped 01/21/21 2019)  acetaminophen (TYLENOL) tablet 1,000 mg (1,000 mg Oral Given 01/21/21 2019)    Pertinent labs & imaging results that were available during my care of the patient were reviewed by me and considered in my medical decision making (see chart for details).   Mitchell Washington was evaluated in Emergency Department on 01/21/2021 for the symptoms described in the history of present illness. He was evaluated in the context of the global COVID-19 pandemic, which necessitated consideration that the patient might be at risk for infection with the SARS-CoV-2 virus that causes COVID-19. Institutional protocols and algorithms that pertain to the evaluation of patients at risk for COVID-19 are in a state of rapid change based on information released by regulatory bodies including the CDC and federal and state organizations. These policies and algorithms were followed during the patient's care in the ED.   Patient presents with new onset of seizure, 3 days of fever.  No focal symptoms or exam findings.  Vital signs are normal on exam except for elevated temperature of 100.3.  Will check labs, chest x-ray, COVID/flu, UA, CT head.  Will give a dose of Tylenol to maintain normothermia.  Clinical Course as of 01/21/21 2303  Nancy Fetter Jan 21, 2021  2016 COVID-positive.  We will follow-up UA and CT head [PS]     Clinical Course User Index [PS] Carrie Mew, MD     ----------------------------------------- 11:03 PM on 01/21/2021 -----------------------------------------  CT unremarkable, urinalysis negative.  Vital signs normal, patient remains at baseline mental status.  Discussed with spouse who is comfortable taking patient home.  Will prescribe Paxlovid, Zofran, famotidine, follow-up with PCP.  Return precautions discussed.  Will focus on hydration and supportive care. She will attempt to maintain good masking and in-home isolation as much as possible and safe to do so.  ____________________________________________   FINAL CLINICAL IMPRESSION(S) / ED DIAGNOSES    Final diagnoses:  COVID-19 virus infection  Chronic dementia without behavioral disturbance [facility])     ED Discharge Orders         Ordered    Nirmatrelvir & Ritonavir (PAXLOVID) 20 x 150 MG & 10 x 100MG TBPK  2 times daily        01/21/21 2301    ondansetron (ZOFRAN ODT) 4 MG disintegrating tablet  Every 8 hours PRN        01/21/21 2302    famotidine (PEPCID) 20 MG tablet  2 times daily        01/21/21 2302          Portions of this note were generated with dragon dictation software. Dictation errors may occur despite best attempts at proofreading.   Carrie Mew, MD 01/21/21 [phone]

## 2021-01-21 NOTE — Progress Notes (Signed)
   01/21/21 1425  Clinical Encounter Type  Visited With Patient and family together  Visit Type Spiritual support;Social support;Initial  Referral From Chaplain  Consult/Referral To Chaplain  Spiritual Encounters  Spiritual Needs Emotional;Other (Comment)  Chaplain Burris provided pastoral support to Mitchell Washington and Mitchell Washington in the ED waiting area. Chaplain Burris provided compassionate presence and reflective listening. 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 Mitchell Washington offered support regarding uncertainty, confronting mortality, and use of faith for as a source of support.

## 2021-01-21 NOTE — ED Notes (Signed)
Received report from Ashley, RN

## 2021-01-21 NOTE — ED Triage Notes (Signed)
Pt to ED POV for seizures today, wife denies hx of seizures. Reports he has been having fevers since Friday. Febrile on arrival. Pt is confused, wife reports this is baseline d/t dementia.   Wife reports 2 seizures today at church, falling to floor. No loss of bowel or bladder function noted. Denies blood thinner use  Pt joking in triage, alert, clear speech

## 2021-01-25 ENCOUNTER — Ambulatory Visit: Payer: Medicare HMO | Admitting: Podiatry

## 2021-03-22 ENCOUNTER — Ambulatory Visit: Payer: Medicare HMO | Admitting: Podiatry

## 2021-03-22 ENCOUNTER — Encounter: Payer: Self-pay | Admitting: Podiatry

## 2021-03-22 ENCOUNTER — Other Ambulatory Visit: Payer: Self-pay

## 2021-03-22 DIAGNOSIS — N183 Chronic kidney disease, stage 3 unspecified: Secondary | ICD-10-CM

## 2021-03-22 DIAGNOSIS — M79675 Pain in left toe(s): Secondary | ICD-10-CM

## 2021-03-22 DIAGNOSIS — B351 Tinea unguium: Secondary | ICD-10-CM

## 2021-03-22 DIAGNOSIS — M79674 Pain in right toe(s): Secondary | ICD-10-CM

## 2021-03-22 DIAGNOSIS — E1151 Type 2 diabetes mellitus with diabetic peripheral angiopathy without gangrene: Secondary | ICD-10-CM | POA: Diagnosis not present

## 2021-03-22 NOTE — Progress Notes (Signed)
This patient returns to my office for at risk foot care.  This patient requires this care by a professional since this patient will be at risk due to having diabetes and CKD.  This patient is unable to cut nails himself since the patient cannot reach his nails.These nails are painful walking and wearing shoes.  This patient presents for at risk foot care today.  General Appearance  Alert, conversant and in no acute stress.  Vascular  Dorsalis pedis and posterior tibial  pulses are palpable  bilaterally.  Capillary return is within normal limits  bilaterally. Temperature is within normal limits  bilaterally.  Neurologic  Senn-Weinstein monofilament wire test within normal limits  bilaterally. Muscle power within normal limits bilaterally.  Nails Thick disfigured discolored nails with subungual debris  from hallux to fifth toes bilaterally. No evidence of bacterial infection or drainage bilaterally.  Orthopedic  No limitations of motion  feet .  No crepitus or effusions noted.  No bony pathology or digital deformities noted.  Skin  normotropic skin with no porokeratosis noted bilaterally.  No signs of infections or ulcers noted.     Onychomycosis  Pain in right toes  Pain in left toes  Consent was obtained for treatment procedures.   Mechanical debridement of nails 1-5  bilaterally performed with a nail nipper.  Filed with dremel without incident.    Return office visit   3 months                   Told patient to return for periodic foot care and evaluation due to potential at risk complications.   Klea Nall DPM   

## 2021-06-25 ENCOUNTER — Ambulatory Visit: Payer: Medicare HMO | Admitting: Podiatry

## 2021-06-25 ENCOUNTER — Other Ambulatory Visit: Payer: Self-pay

## 2021-06-25 ENCOUNTER — Encounter: Payer: Self-pay | Admitting: Podiatry

## 2021-06-25 ENCOUNTER — Encounter (INDEPENDENT_AMBULATORY_CARE_PROVIDER_SITE_OTHER): Payer: Self-pay

## 2021-06-25 DIAGNOSIS — N183 Chronic kidney disease, stage 3 unspecified: Secondary | ICD-10-CM | POA: Diagnosis not present

## 2021-06-25 DIAGNOSIS — M79674 Pain in right toe(s): Secondary | ICD-10-CM

## 2021-06-25 DIAGNOSIS — M79675 Pain in left toe(s): Secondary | ICD-10-CM

## 2021-06-25 DIAGNOSIS — B351 Tinea unguium: Secondary | ICD-10-CM

## 2021-06-25 DIAGNOSIS — E1151 Type 2 diabetes mellitus with diabetic peripheral angiopathy without gangrene: Secondary | ICD-10-CM

## 2021-06-25 NOTE — Progress Notes (Signed)
This patient returns to my office for at risk foot care.  This patient requires this care by a professional since this patient will be at risk due to having diabetes and CKD.  This patient is unable to cut nails himself since the patient cannot reach his nails.These nails are painful walking and wearing shoes.  This patient presents for at risk foot care today.  General Appearance  Alert, conversant and in no acute stress.  Vascular  Dorsalis pedis and posterior tibial  pulses are palpable  bilaterally.  Capillary return is within normal limits  bilaterally. Temperature is within normal limits  bilaterally.  Neurologic  Senn-Weinstein monofilament wire test within normal limits  bilaterally. Muscle power within normal limits bilaterally.  Nails Thick disfigured discolored nails with subungual debris  from hallux to fifth toes bilaterally. No evidence of bacterial infection or drainage bilaterally.  Orthopedic  No limitations of motion  feet .  No crepitus or effusions noted.  No bony pathology or digital deformities noted.  Skin  normotropic skin with no porokeratosis noted bilaterally.  No signs of infections or ulcers noted.     Onychomycosis  Pain in right toes  Pain in left toes  Consent was obtained for treatment procedures.   Mechanical debridement of nails 1-5  bilaterally performed with a nail nipper.  Filed with dremel without incident.    Return office visit   3 months                   Told patient to return for periodic foot care and evaluation due to potential at risk complications.   Mohamedamin Nifong DPM   

## 2021-10-01 ENCOUNTER — Encounter: Payer: Self-pay | Admitting: Podiatry

## 2021-10-01 ENCOUNTER — Ambulatory Visit: Payer: Medicare HMO | Admitting: Podiatry

## 2021-10-01 ENCOUNTER — Other Ambulatory Visit: Payer: Self-pay

## 2021-10-01 DIAGNOSIS — M79675 Pain in left toe(s): Secondary | ICD-10-CM

## 2021-10-01 DIAGNOSIS — B351 Tinea unguium: Secondary | ICD-10-CM

## 2021-10-01 DIAGNOSIS — E1151 Type 2 diabetes mellitus with diabetic peripheral angiopathy without gangrene: Secondary | ICD-10-CM

## 2021-10-01 DIAGNOSIS — M79674 Pain in right toe(s): Secondary | ICD-10-CM

## 2021-10-01 DIAGNOSIS — N183 Chronic kidney disease, stage 3 unspecified: Secondary | ICD-10-CM

## 2021-10-01 NOTE — Progress Notes (Signed)
This patient returns to my office for at risk foot care.  This patient requires this care by a professional since this patient will be at risk due to having diabetes and CKD.  This patient is unable to cut nails himself since the patient cannot reach his nails.These nails are painful walking and wearing shoes.  This patient presents for at risk foot care today.  General Appearance  Alert, conversant and in no acute stress.  Vascular  Dorsalis pedis and posterior tibial  pulses are palpable  bilaterally.  Capillary return is within normal limits  bilaterally. Temperature is within normal limits  bilaterally.  Neurologic  Senn-Weinstein monofilament wire test within normal limits  bilaterally. Muscle power within normal limits bilaterally.  Nails Thick disfigured discolored nails with subungual debris  from hallux to fifth toes bilaterally. No evidence of bacterial infection or drainage bilaterally.  Orthopedic  No limitations of motion  feet .  No crepitus or effusions noted.  No bony pathology or digital deformities noted.  Skin  normotropic skin with no porokeratosis noted bilaterally.  No signs of infections or ulcers noted.     Onychomycosis  Pain in right toes  Pain in left toes  Consent was obtained for treatment procedures.   Mechanical debridement of nails 1-5  bilaterally performed with a nail nipper.  Filed with dremel without incident.    Return office visit   3 months                   Told patient to return for periodic foot care and evaluation due to potential at risk complications.   Filip Luten DPM   

## 2022-01-03 ENCOUNTER — Ambulatory Visit: Payer: Medicare HMO | Admitting: Podiatry

## 2022-01-03 ENCOUNTER — Encounter: Payer: Self-pay | Admitting: Podiatry

## 2022-01-03 DIAGNOSIS — M79675 Pain in left toe(s): Secondary | ICD-10-CM

## 2022-01-03 DIAGNOSIS — M79674 Pain in right toe(s): Secondary | ICD-10-CM

## 2022-01-03 DIAGNOSIS — E1151 Type 2 diabetes mellitus with diabetic peripheral angiopathy without gangrene: Secondary | ICD-10-CM

## 2022-01-03 DIAGNOSIS — B351 Tinea unguium: Secondary | ICD-10-CM | POA: Diagnosis not present

## 2022-01-03 DIAGNOSIS — N183 Chronic kidney disease, stage 3 unspecified: Secondary | ICD-10-CM | POA: Diagnosis not present

## 2022-01-03 NOTE — Progress Notes (Signed)
This patient returns to my office for at risk foot care.  This patient requires this care by a professional since this patient will be at risk due to having diabetes and CKD.  This patient is unable to cut nails himself since the patient cannot reach his nails.These nails are painful walking and wearing shoes.  This patient presents for at risk foot care today.  General Appearance  Alert, conversant and in no acute stress.  Vascular  Dorsalis pedis and posterior tibial  pulses are palpable  bilaterally.  Capillary return is within normal limits  bilaterally. Temperature is within normal limits  bilaterally.  Neurologic  Senn-Weinstein monofilament wire test within normal limits  bilaterally. Muscle power within normal limits bilaterally.  Nails Thick disfigured discolored nails with subungual debris  from hallux to fifth toes bilaterally. No evidence of bacterial infection or drainage bilaterally.  Orthopedic  No limitations of motion  feet .  No crepitus or effusions noted.  No bony pathology or digital deformities noted.  Skin  normotropic skin with no porokeratosis noted bilaterally.  No signs of infections or ulcers noted.     Onychomycosis  Pain in right toes  Pain in left toes  Consent was obtained for treatment procedures.   Mechanical debridement of nails 1-5  bilaterally performed with a nail nipper.  Filed with dremel without incident.    Return office visit   3 months                   Told patient to return for periodic foot care and evaluation due to potential at risk complications.   Zehava Turski DPM   

## 2022-04-04 ENCOUNTER — Ambulatory Visit: Payer: Medicare HMO | Admitting: Podiatry

## 2022-05-16 ENCOUNTER — Ambulatory Visit: Payer: Medicare HMO | Admitting: Podiatry

## 2022-05-16 ENCOUNTER — Encounter: Payer: Self-pay | Admitting: Podiatry

## 2022-05-16 DIAGNOSIS — N183 Chronic kidney disease, stage 3 unspecified: Secondary | ICD-10-CM

## 2022-05-16 DIAGNOSIS — B351 Tinea unguium: Secondary | ICD-10-CM

## 2022-05-16 DIAGNOSIS — M79675 Pain in left toe(s): Secondary | ICD-10-CM

## 2022-05-16 DIAGNOSIS — M79674 Pain in right toe(s): Secondary | ICD-10-CM

## 2022-05-16 DIAGNOSIS — E1151 Type 2 diabetes mellitus with diabetic peripheral angiopathy without gangrene: Secondary | ICD-10-CM | POA: Diagnosis not present

## 2022-05-16 NOTE — Progress Notes (Signed)
This patient returns to my office for at risk foot care.  This patient requires this care by a professional since this patient will be at risk due to having diabetes and CKD.  This patient is unable to cut nails himself since the patient cannot reach his nails.These nails are painful walking and wearing shoes.  This patient presents for at risk foot care today.  General Appearance  Alert, conversant and in no acute stress.  Vascular  Dorsalis pedis and posterior tibial  pulses are palpable  bilaterally.  Capillary return is within normal limits  bilaterally. Temperature is within normal limits  bilaterally.  Neurologic  Senn-Weinstein monofilament wire test within normal limits  bilaterally. Muscle power within normal limits bilaterally.  Nails Thick disfigured discolored nails with subungual debris  from hallux to fifth toes bilaterally. No evidence of bacterial infection or drainage bilaterally.  Orthopedic  No limitations of motion  feet .  No crepitus or effusions noted.  No bony pathology or digital deformities noted.  Skin  normotropic skin with no porokeratosis noted bilaterally.  No signs of infections or ulcers noted.     Onychomycosis  Pain in right toes  Pain in left toes  Consent was obtained for treatment procedures.   Mechanical debridement of nails 1-5  bilaterally performed with a nail nipper.  Filed with dremel without incident.    Return office visit   3 months                   Told patient to return for periodic foot care and evaluation due to potential at risk complications.   Jilleen Essner DPM   

## 2022-07-15 ENCOUNTER — Emergency Department: Payer: Medicare HMO

## 2022-07-15 ENCOUNTER — Emergency Department
Admission: EM | Admit: 2022-07-15 | Discharge: 2022-07-16 | Disposition: A | Discharge status: Home / Self Care | Payer: Medicare HMO | Attending: Emergency Medicine | Admitting: Emergency Medicine

## 2022-07-15 ENCOUNTER — Encounter (HOSPITAL_COMMUNITY): Payer: Self-pay

## 2022-07-15 ENCOUNTER — Other Ambulatory Visit: Payer: Self-pay | Admitting: Internal Medicine

## 2022-07-15 ENCOUNTER — Other Ambulatory Visit: Payer: Self-pay

## 2022-07-15 DIAGNOSIS — N183 Chronic kidney disease, stage 3 unspecified: Secondary | ICD-10-CM | POA: Diagnosis not present

## 2022-07-15 DIAGNOSIS — I129 Hypertensive chronic kidney disease with stage 1 through stage 4 chronic kidney disease, or unspecified chronic kidney disease: Secondary | ICD-10-CM | POA: Insufficient documentation

## 2022-07-15 DIAGNOSIS — Z7984 Long term (current) use of oral hypoglycemic drugs: Secondary | ICD-10-CM | POA: Insufficient documentation

## 2022-07-15 DIAGNOSIS — E7800 Pure hypercholesterolemia, unspecified: Secondary | ICD-10-CM | POA: Diagnosis present

## 2022-07-15 DIAGNOSIS — Z79899 Other long term (current) drug therapy: Secondary | ICD-10-CM | POA: Insufficient documentation

## 2022-07-15 DIAGNOSIS — G450 Vertebro-basilar artery syndrome: Secondary | ICD-10-CM | POA: Diagnosis not present

## 2022-07-15 DIAGNOSIS — R29818 Other symptoms and signs involving the nervous system: Secondary | ICD-10-CM | POA: Diagnosis present

## 2022-07-15 DIAGNOSIS — F0390 Unspecified dementia without behavioral disturbance: Secondary | ICD-10-CM | POA: Diagnosis not present

## 2022-07-15 DIAGNOSIS — I63539 Cerebral infarction due to unspecified occlusion or stenosis of unspecified posterior cerebral artery: Secondary | ICD-10-CM | POA: Diagnosis not present

## 2022-07-15 DIAGNOSIS — G459 Transient cerebral ischemic attack, unspecified: Secondary | ICD-10-CM | POA: Diagnosis not present

## 2022-07-15 DIAGNOSIS — Z8673 Personal history of transient ischemic attack (TIA), and cerebral infarction without residual deficits: Secondary | ICD-10-CM | POA: Diagnosis not present

## 2022-07-15 DIAGNOSIS — Z87891 Personal history of nicotine dependence: Secondary | ICD-10-CM | POA: Diagnosis not present

## 2022-07-15 DIAGNOSIS — Z7982 Long term (current) use of aspirin: Secondary | ICD-10-CM | POA: Insufficient documentation

## 2022-07-15 DIAGNOSIS — E1142 Type 2 diabetes mellitus with diabetic polyneuropathy: Secondary | ICD-10-CM | POA: Diagnosis present

## 2022-07-15 DIAGNOSIS — R531 Weakness: Secondary | ICD-10-CM | POA: Diagnosis present

## 2022-07-15 DIAGNOSIS — E1122 Type 2 diabetes mellitus with diabetic chronic kidney disease: Secondary | ICD-10-CM | POA: Insufficient documentation

## 2022-07-15 DIAGNOSIS — I10 Essential (primary) hypertension: Secondary | ICD-10-CM | POA: Diagnosis present

## 2022-07-15 DIAGNOSIS — N1832 Chronic kidney disease, stage 3b: Secondary | ICD-10-CM | POA: Diagnosis present

## 2022-07-15 LAB — CBC
HCT: 42.4 % (ref 39.0–52.0)
Hemoglobin: 13.6 g/dL (ref 13.0–17.0)
MCH: 26.5 pg (ref 26.0–34.0)
MCHC: 32.1 g/dL (ref 30.0–36.0)
MCV: 82.7 fL (ref 80.0–100.0)
Platelets: 313 10*3/uL (ref 150–400)
RBC: 5.13 MIL/uL (ref 4.22–5.81)
RDW: 13.2 % (ref 11.5–15.5)
WBC: 6 10*3/uL (ref 4.0–10.5)
nRBC: 0 % (ref 0.0–0.2)

## 2022-07-15 LAB — APTT: aPTT: 28 seconds (ref 24–36)

## 2022-07-15 LAB — DIFFERENTIAL
Abs Immature Granulocytes: 0.01 10*3/uL (ref 0.00–0.07)
Basophils Absolute: 0 10*3/uL (ref 0.0–0.1)
Basophils Relative: 1 %
Eosinophils Absolute: 0.2 10*3/uL (ref 0.0–0.5)
Eosinophils Relative: 3 %
Immature Granulocytes: 0 %
Lymphocytes Relative: 32 %
Lymphs Abs: 1.9 10*3/uL (ref 0.7–4.0)
Monocytes Absolute: 0.5 10*3/uL (ref 0.1–1.0)
Monocytes Relative: 9 %
Neutro Abs: 3.4 10*3/uL (ref 1.7–7.7)
Neutrophils Relative %: 55 %

## 2022-07-15 LAB — COMPREHENSIVE METABOLIC PANEL
ALT: 12 U/L (ref 0–44)
AST: 20 U/L (ref 15–41)
Albumin: 4.2 g/dL (ref 3.5–5.0)
Alkaline Phosphatase: 65 U/L (ref 38–126)
Anion gap: 10 (ref 5–15)
BUN: 22 mg/dL (ref 8–23)
CO2: 25 mmol/L (ref 22–32)
Calcium: 9.3 mg/dL (ref 8.9–10.3)
Chloride: 106 mmol/L (ref 98–111)
Creatinine, Ser: 1.64 mg/dL — ABNORMAL HIGH (ref 0.61–1.24)
GFR, Estimated: 45 mL/min — ABNORMAL LOW (ref >60)
Glucose, Bld: 127 mg/dL — ABNORMAL HIGH (ref 70–99)
Potassium: 4 mmol/L (ref 3.5–5.1)
Sodium: 141 mmol/L (ref 135–145)
Total Bilirubin: 0.6 mg/dL (ref 0.3–1.2)
Total Protein: 8.2 g/dL — ABNORMAL HIGH (ref 6.5–8.1)

## 2022-07-15 LAB — TROPONIN I (HIGH SENSITIVITY)
Troponin I (High Sensitivity): 6 ng/L (ref <18)
Troponin I (High Sensitivity): 8 ng/L (ref <18)

## 2022-07-15 LAB — CBG MONITORING, ED
Glucose-Capillary: 122 mg/dL — ABNORMAL HIGH (ref 70–99)
Glucose-Capillary: 152 mg/dL — ABNORMAL HIGH (ref 70–99)

## 2022-07-15 LAB — ETHANOL: Alcohol, Ethyl (B): <10 mg/dL (ref <10)

## 2022-07-15 LAB — PROTIME-INR
INR: 1 (ref 0.8–1.2)
Prothrombin Time: 13.4 seconds (ref 11.4–15.2)

## 2022-07-15 MED ORDER — CLOPIDOGREL BISULFATE 75 MG PO TABS: 75.0000 mg | ORAL_TABLET | Freq: Every day | ORAL | Status: DC

## 2022-07-15 MED ORDER — SODIUM CHLORIDE 0.9% FLUSH: 3.0000 mL | Freq: Once | INTRAVENOUS | Status: DC

## 2022-07-15 MED ORDER — ASPIRIN 81 MG PO CHEW
324.0000 mg | CHEWABLE_TABLET | Freq: Once | ORAL | Status: AC
  Administered 2022-07-15: 324 mg via ORAL
  Filled 2022-07-15: qty 4

## 2022-07-15 MED ORDER — IOHEXOL 350 MG/ML SOLN
100.0000 mL | Freq: Once | INTRAVENOUS | Status: AC | PRN
  Administered 2022-07-15: 100 mL via INTRAVENOUS

## 2022-07-15 MED ORDER — CLOPIDOGREL BISULFATE 75 MG PO TABS
300.0000 mg | ORAL_TABLET | Freq: Once | ORAL | Status: AC
  Administered 2022-07-15: 300 mg via ORAL
  Filled 2022-07-15: qty 4

## 2022-07-15 MED ORDER — ASPIRIN 81 MG PO CHEW: 628.0000 mg | CHEWABLE_TABLET | Freq: Once | ORAL | Status: DC

## 2022-07-15 MED ORDER — ASPIRIN 325 MG PO TABS: 325.0000 mg | ORAL_TABLET | Freq: Every day | ORAL | Status: DC

## 2022-07-15 NOTE — Progress Notes (Signed)
   07/15/22 1500  Clinical Encounter Type  Visited With Patient and family together  Visit Type Initial  Referral From Nurse  Consult/Referral To Chaplain   Chaplain responded to page. Chaplain provided compassionate presence and reflective listening as patient and wife spoke about health challenges. Chaplain offered support to family member over the phone as well. Chaplain provided emotional/spiritual support as following updates from medical team. Patient and family appreciated Chaplain visit. Chaplain services are available for follow up as needed.

## 2022-07-15 NOTE — ED Notes (Signed)
Neurology at bedside.

## 2022-07-15 NOTE — ED Notes (Signed)
RN at bedside after exiting critical pt's room. RN entered room to wife stating this is poor service that pt has had to urinate and no one came into the room for assistance. This RN explaining multiple times that this RN was in critical pt's room. Wife then stating that the "bells are ringing and the person answered on the phone." This RN stating the alarms are ringing because pt is moving attempting to use bathroom and secretary answered phone but cannot come into pt room due to restrictions on ability to perform pt care. Charge RN made aware to go into room to address wife's concerns.

## 2022-07-15 NOTE — ED Notes (Signed)
Pt transported to MRI 

## 2022-07-15 NOTE — ED Notes (Signed)
EDP at bedside  

## 2022-07-15 NOTE — Progress Notes (Unsigned)
Plan of Care Note for accepted transfer   Patient: Mitchell Washington MRN: [id number]   DOA: (Not on file)  Facility requesting transfer: [facility] Requesting Provider: Larinda Buttery Reason for transfer: Need for neurointerventional radiology on recommendation of neurology Facility course: Patient with new onset weakness and difficulty with balance starting yesterday afternoon. He went to bed and was better when he woke up but returned when trying to walk today. Also some mild left facial weakness noted in the ED. Has been seen by neurology who after CTA was evaluated showing severe stenosis in multiple chronic infarcts with penumbra at risk I recommended transfer to [facility] for neurointerventional radiology evaluation and possible intervention.  Otherwise are recommending stroke workup per note.   Plan of care: The patient is accepted for admission to Telemetry unit, at [facility]..   Author: Synetta Fail, MD 07/15/2022  Check [email or web address] for on-call coverage.  Nursing staff, Please call [facility] Admits & Consults System-Wide number on Amion as soon as patient's arrival, so appropriate admitting provider can evaluate the pt.

## 2022-07-15 NOTE — ED Notes (Addendum)
Pt back in room from CT with this RN, Morrie Sheldon, RN and Dr. Wilford Corner.

## 2022-07-15 NOTE — ED Notes (Signed)
Accepted by hospitalist to [facility], waiting for bed assignment 1545

## 2022-07-15 NOTE — Progress Notes (Signed)
Spoke with Dr. Corliss Skains.  He will evaluate the patient for possible arteriogram likely tomorrow. Please keep the patient n.p.o. past midnight.  -- Milon Dikes, MD Neurologist [facility] Pager: [phone]

## 2022-07-15 NOTE — ED Provider Notes (Signed)
[facility] Provider Note    Event Date/Time   First MD Initiated Contact with Patient 07/15/22 1022     (approximate)   History   Chief Complaint Weakness   HPI  Mitchell Washington is a 71 y.o. male with past medical history of hypertension, hyperlipidemia, diabetes, CKD, stroke, and dementia who presents to the ED for weakness.  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  He then seemed to get better over the course of this evening and was doing well when he woke up this morning around 815.  She states that he was walking normally and she has not noticed any slurred speech.  He then went to get up from the breakfast table around 915, at which point she again noticed that he seemed to be stumbling while he walked.  She states that he has otherwise been doing well recently with no fevers, cough, chest pain, shortness of breath, nausea, vomiting, or diarrhea.  Patient currently denies any complaints, states he is here because of "old age."  He does not take any blood thinners per wife.     Physical Exam   Triage Vital Signs: ED Triage Vitals  Enc Vitals Group     BP 07/15/22 1015 (!) 163/65     Pulse Rate 07/15/22 1015 (!) 54     Resp 07/15/22 1015 16     Temp 07/15/22 1015 98.4 F (36.9 C)     Temp Source 07/15/22 1015 Oral     SpO2 07/15/22 1015 98 %     Weight 07/15/22 1012 170 lb (77.1 kg)     Height 07/15/22 1012 5\' 9"  (1.753 m)     Head Circumference --      Peak Flow --      Pain Score 07/15/22 1012 0     Pain Loc --      Pain Edu? --      Excl. in GC? --     Most recent vital signs: Vitals:   07/15/22 1443 07/15/22 1501  BP:  (!) 162/71  Pulse: (!) 52 (!) 58  Resp: 20 12  Temp:  97.7 F (36.5 C)  SpO2: 98% 100%    Constitutional: Alert and oriented to person and place, but not time or  situation. Eyes: Conjunctivae are normal.  Pupils equal, round, and reactive to light bilaterally. Head: Atraumatic. Nose: No congestion/rhinnorhea. Mouth/Throat: Mucous membranes are moist.  Cardiovascular: Normal rate, regular rhythm. Grossly normal heart sounds.  2+ radial pulses bilaterally. Respiratory: Normal respiratory effort.  No retractions. Lungs CTAB. Gastrointestinal: Soft and nontender. No distention. Musculoskeletal: No lower extremity tenderness nor edema.  Neurologic:  Normal speech and language.  Subtle left-sided facial droop noted.  4 out of 5 strength in left upper extremity with pronator drift, 5 out of 5 strength in right upper extremity and bilateral lower extremities.  Dysmetria noted with finger-nose and heel-to-shin testing on left.    ED Results / Procedures / Treatments   Labs (all labs ordered are listed, but only abnormal results are displayed) Labs Reviewed  COMPREHENSIVE METABOLIC PANEL - Abnormal; Notable for the following components:      Result Value   Glucose, Bld 127 (*)    Creatinine, Ser 1.64 (*)    Total Protein 8.2 (*)    GFR, Estimated 45 (*)  All other components within normal limits  CBG MONITORING, ED - Abnormal; Notable for the following components:   Glucose-Capillary 122 (*)    All other components within normal limits  PROTIME-INR  APTT  CBC  DIFFERENTIAL  ETHANOL  URINE DRUG SCREEN, QUALITATIVE ([facility] ONLY)  URINALYSIS, ROUTINE W REFLEX MICROSCOPIC  CBG MONITORING, ED  I-STAT CREATININE, ED  TROPONIN I (HIGH SENSITIVITY)  TROPONIN I (HIGH SENSITIVITY)     EKG  ED ECG REPORT I, Chesley Noon, the attending physician, personally viewed and interpreted this ECG.   Date: 07/15/2022  EKG Time: 10:21  Rate: 54  Rhythm: sinus bradycardia  Axis: Normal  Intervals:none  ST&T Change: Inferolateral ST/T wave abnormality, similar to previous  RADIOLOGY CT head reviewed and interpreted by me with no hemorrhage or midline  shift.  PROCEDURES:  Critical Care performed: Yes, see critical care procedure note(s)  .Critical Care  Performed by: Chesley Noon, MD Authorized by: Chesley Noon, MD   Critical care provider statement:    Critical care time (minutes):  30   Critical care time was exclusive of:  Separately billable procedures and treating other patients and teaching time   Critical care was necessary to treat or prevent imminent or life-threatening deterioration of the following conditions:  CNS failure or compromise   Critical care was time spent personally by me on the following activities:  Development of treatment plan with patient or surrogate, discussions with consultants, evaluation of patient's response to treatment, examination of patient, ordering and review of laboratory studies, ordering and review of radiographic studies, ordering and performing treatments and interventions, pulse oximetry, re-evaluation of patient's condition and review of old charts   I assumed direction of critical care for this patient from another provider in my specialty: no     Care discussed with: admitting provider      MEDICATIONS ORDERED IN ED: Medications  sodium chloride flush (NS) 0.9 % injection 3 mL ( Intravenous Canceled Entry 07/15/22 1108)  iohexol (OMNIPAQUE) 350 MG/ML injection 100 mL (100 mLs Intravenous Contrast Given 07/15/22 1109)  aspirin chewable tablet 324 mg (324 mg Oral Given 07/15/22 1501)  clopidogrel (PLAVIX) tablet 300 mg (300 mg Oral Given 07/15/22 1525)  aspirin chewable tablet 324 mg (324 mg Oral Given 07/15/22 1524)     IMPRESSION / MDM / ASSESSMENT AND PLAN / ED COURSE  I reviewed the triage vital signs and the nursing notes.                              71 y.o. male with past medical history of hypertension, hyperlipidemia, diabetes, CKD, stroke, and dementia who presents to the ED for stumbling gait noted intermittently since yesterday afternoon.  Patient's presentation  is most consistent with acute presentation with potential threat to life or bodily function.  Differential diagnosis includes, but is not limited to, stroke, TIA, intracranial hemorrhage, electrolyte abnormality, AKI, UTI.  Patient nontoxic-appearing and in no acute distress, vital signs remarkable for bradycardia but otherwise reassuring.  On my assessment, patient appears to have left-sided facial droop with pronator drift on the left and dysmetria noted in left upper and lower extremities.  Speech is intact and no obvious visual deficit.  Although symptoms started yesterday, patient had a period where he returned to normal this morning per wife prior to recurrence of symptoms.  Code stroke was activated and we will check CT head as well as CTA of his head and  neck.  Labs are pending at this time.  CT head is negative for acute process, does show evidence of chronic posterior circulation stroke and CTA of the head and neck shows significant stenosis in the posterior circulation.  Patient's symptoms seem to be improving at the time of neurology evaluation and decision by Dr. Wilford Corner to hold off on TNK, but with CTA findings he would benefit from nonemergent neuro IR intervention.  We will load with aspirin and Plavix per neurology, currently awaiting bed availability at [facility].      FINAL CLINICAL IMPRESSION(S) / ED DIAGNOSES   Final diagnoses:  TIA (transient ischemic attack)  Chronic ischemia posterior cerebral artery stroke     Rx / DC Orders   ED Discharge Orders     None        Note:  This document was prepared using Dragon voice recognition software and may include unintentional dictation errors.   Chesley Noon, MD 07/15/22 1540

## 2022-07-15 NOTE — Progress Notes (Signed)
[facility] 1039 - Neuro already paged at this time  1041 Dr. Wilford Corner at bedside  1045 Pt to CT  1046 No TNK per Dr. Wilford Corner   LNW 0815 mRS 0

## 2022-07-15 NOTE — ED Notes (Signed)
Gave pt [place] sandwich and water. Wife at bedside to help feed.

## 2022-07-15 NOTE — ED Triage Notes (Addendum)
Wife reports patient was staggering around the house yesterday but got better and then started again this morning. Patient denies chest pain sob, trouble urinating, cough headache.  Hx dementia.  Patient does report he is weak.    Wife reports 2pm yesterday was having difficulty walking and was leaning to left side and stumbling.  Which occurred again this am.   Hx of stroke which she reports has not deficits.   Patient appears to be having slurred speech and expressive aphasia at times. Also mild left arm pronator drift and mild tongue deviation to the left.

## 2022-07-15 NOTE — Consult Note (Addendum)
Neurology Consultation  Reason for Consult: Code stroke-left facial droop, weakness Referring Physician: Dr. Chesley Noon  CC: Disequilibrium, weakness  History is obtained from: Wife, chart  HPI: Mitchell Washington is a 71 y.o. male past medical history of prior stroke with no residual deficit, hypertension, hyperlipidemia, memory loss, REM sleep behavior disorder, tremor and dementia without diagnosis of Parkinson's-presented to the emergency room for evaluation of symptoms of generalized weakness and disequilibrium.  Wife reports that somewhere around 2 PM on 07/14/2022, he started having difficulty walking and maintaining his a kilogram.  He then went to bed later at night and this morning symptoms were somewhat better but then when he attempted to walk, symptoms recurred.  He was brought into the emergency room where he was noted to have a subtle left lower facial weakness along with dysmetria for which a code stroke was activated. Wife reports prior stroke many years ago with no residual deficits.  Follows with Dr. Malvin Johns at [facility].  LKW: 2 PM 07/14/2022 IV thrombolysis given?: no, outside the window Premorbid modified Rankin scale (mRS): 1  ROS: Full ROS was performed and is negative except as noted in the HPI.   Past Medical History:  Diagnosis Date   CVA (cerebral infarction)    Diabetes mellitus without complication (HCC)    Gout    Hyperlipidemia    Hypertension    Memory loss    Stroke [facility])    Wears dentures    partial upper     Family History  Problem Relation Age of Onset   Diabetes Mother      Social History:   reports that he has quit smoking. He has never used smokeless tobacco. He reports that he does not currently use alcohol. He reports that he does not use drugs.  Medications  Current Facility-Administered Medications:    sodium chloride flush (NS) 0.9 % injection 3 mL, 3 mL, Intravenous, Once, Chesley Noon, MD  Current Outpatient  Medications:    clonazePAM (KLONOPIN) 0.5 MG tablet, Take 0.5 mg by mouth at bedtime as needed for anxiety., Disp: , Rfl:    amLODipine (NORVASC) 5 MG tablet, Take 5 mg by mouth daily., Disp: , Rfl:    amLODipine (NORVASC) 5 MG tablet, Take 1 tablet by mouth daily., Disp: , Rfl:    aspirin EC 81 MG tablet, Take by mouth., Disp: , Rfl:    atorvastatin (LIPITOR) 10 MG tablet, TAKE 1 TABLET BY MOUTH EVERY DAY, Disp: , Rfl:    [employer] VITAMIN B12 1000 MCG tablet, Take 1,000 mcg by mouth daily., Disp: , Rfl:    cyanocobalamin 1000 MCG tablet, Take 1 tablet by mouth daily., Disp: , Rfl:    Cysteamine Bitartrate (PROCYSBI) 300 MG PACK, Use 1 each once daily Use as instructed., Disp: , Rfl:    donepezil (ARICEPT) 10 MG tablet, , Disp: , Rfl:    famotidine (PEPCID) 20 MG tablet, Take 1 tablet (20 mg total) by mouth 2 (two) times daily., Disp: 60 tablet, Rfl: 0   glipiZIDE (GLUCOTROL XL) 2.5 MG 24 hr tablet, Take by mouth., Disp: , Rfl:    lisinopril (PRINIVIL,ZESTRIL) 20 MG tablet, TAKE 1 TABLET BY MOUTH EVERY DAY, Disp: , Rfl:    lisinopril (ZESTRIL) 40 MG tablet, Take 40 mg by mouth daily., Disp: , Rfl:    memantine (NAMENDA) 10 MG tablet, Take 10 mg by mouth 2 (two) times daily., Disp: , Rfl:    metFORMIN (GLUCOPHAGE-XR) 500 MG 24 hr tablet, Take  by mouth., Disp: , Rfl:    ondansetron (ZOFRAN ODT) 4 MG disintegrating tablet, Take 1 tablet (4 mg total) by mouth every 8 (eight) hours as needed for nausea or vomiting., Disp: 20 tablet, Rfl: 0   Exam: Current vital signs: BP (!) 163/65 (BP Location: Left Arm)   Pulse (!) 58   Temp 98.4 F (36.9 C) (Oral)   Resp 13   Ht 5\' 9"  (1.753 m)   Wt 77.1 kg   SpO2 98%   BMI 25.10 kg/m  Vital signs in last 24 hours: Temp:  [98.4 F (36.9 C)] 98.4 F (36.9 C) (11/27 1015) Pulse Rate:  [54-62] 58 (11/27 1100) Resp:  [13-20] 13 (11/27 1100) BP: (163)/(65) 163/65 (11/27 1015) SpO2:  [97 %-98 %] 98 % (11/27 1100) Weight:  [77.1 kg] 77.1 kg (11/27  1012) General: Awake alert in no distress HEENT: Normocephalic atraumatic Lungs: Clear Cardiovascular: Regular rhythm Abdomen nondistended nontender Extremities warm well-perfused Neurological exam Awake alert oriented x 3 Speech is mildly dysarthric but the wife says that is baseline. No evidence of aphasia Cranial nerves: Pupils equal round react light, extraocular movements intact, visual fields appear full, mild lower facial asymmetry noted at rest but when he smiles he is able to have a symmetric smile, sensation on the face is intact bilaterally and symmetric, auditory acuity intact, tongue and palate midline. Motor examination with no drift in any of the 4 extremities but he does feel slightly weaker in the left leg on individual muscle testing with a 4+/5 strength whereas all the other extremities are 5/5. Sensation intact light touch without extinction Coordination with no obvious dysmetria in the upper extremities, mild dysmetria in the left lower extremity but that might be congruent to the subtle left lower extremity weakness Gait testing deferred at this time NIH stroke scale 1a Level of Conscious.: 0 1b LOC Questions: 0 1c LOC Commands: 0 2 Best Gaze: 0 3 Visual: 0 4 Facial Palsy: 1 5a Motor Arm - left: 0 5b Motor Arm - Right: 0 6a Motor Leg - Left: 0 6b Motor Leg - Right: 0 7 Limb Ataxia: 1 8 Sensory: 0 9 Best Language: 0 10 Dysarthria: 1 11 Extinct. and Inatten.: 0 TOTAL: 3   Labs I have reviewed labs in epic and the results pertinent to this consultation are:  CBC    Component Value Date/Time   WBC 6.0 07/15/2022 1040   RBC 5.13 07/15/2022 1040   HGB 13.6 07/15/2022 1040   HCT 42.4 07/15/2022 1040   PLT 313 07/15/2022 1040   MCV 82.7 07/15/2022 1040   MCH 26.5 07/15/2022 1040   MCHC 32.1 07/15/2022 1040   RDW 13.2 07/15/2022 1040   LYMPHSABS 1.9 07/15/2022 1040   MONOABS 0.5 07/15/2022 1040   EOSABS 0.2 07/15/2022 1040   BASOSABS 0.0 07/15/2022  1040    CMP     Component Value Date/Time   NA 141 07/15/2022 1040   K 4.0 07/15/2022 1040   CL 106 07/15/2022 1040   CO2 25 07/15/2022 1040   GLUCOSE 127 (H) 07/15/2022 1040   BUN 22 07/15/2022 1040   CREATININE 1.64 (H) 07/15/2022 1040   CALCIUM 9.3 07/15/2022 1040   PROT 8.2 (H) 07/15/2022 1040   ALBUMIN 4.2 07/15/2022 1040   AST 20 07/15/2022 1040   ALT 12 07/15/2022 1040   ALKPHOS 65 07/15/2022 1040   BILITOT 0.6 07/15/2022 1040   GFRNONAA 45 (L) 07/15/2022 1040   GFRAA 53 (L) 10/11/2018 1313  Imaging I have reviewed the images obtained: CT-head-no acute changes.  Aspects 10.  Multiple chronic infarcts in the posterior circulation and left basal ganglia. CT angiography head and neck-no emergent LVO but significant stenosis noted in the bilateral V4's, basilar artery as well as bilateral PCAs, significant stenosis mid basilar artery  .  Assessment:  71 year old man with above past medical history presenting for evaluation of generalized weakness, difficulty walking and disequilibrium, on examination noted to have a very subtle left lower facial droop, mild dysarthria as well as mild left lower extremity dystaxia.  No LVO signs.  Last known well greater than 4 and half hours precludes IV thrombolysis.  NIH stroke scale of 3 also precludes emergent EVT but he does warrant further imaging and discussion with endovascular regarding his severely stenotic multiple vessels in the posterior circulatio-especially basilar and PCAs.  Posterior circulation stroke versus posterior circulation TIA from [facility]BI are the top differentials at this time  Recommendations: MRI of the brain without contrast. Awaiting callback from neuroendovascular to discuss the case. In either case, I think he would benefit from an admission but the question is if he needs to be admitted to [facility] versus [facility] in case he needs elective arteriogram and possible posterior  circulation revascularization. I will update once I have the MRI and the discussions with the interventionalists. Preliminary plan discussed with Dr. Larinda Buttery in the emergency room   Addendum MRI brain reviewed personally- no stroke Discussion with interventional not possible due to being in a case. Discussed with stroke neurologist at [facility] - agrees that patient will need elective angio and possibly basilar angioplasty vs stenting.   IMPRESSION Posterior circulation TIA Vertebrobasilar insufficiency   Updated recommendations: Admit to [facility] - hospitalist service. Stroke team will follow tomorrow. Avoid hypotension. Keep SBP above 140 and <220. Strict bedrest  Frequent neurochecks Telemetry Aspirin 325+ Plavix 75 daily from tomorrow.  Today, loaded with aspirin 650 and Plavix 300. High intensity statin 2D echo A1c Lipid panel PT OT Speech therapy Discussed with Dr. Larinda Buttery in ER at [facility]  -- Milon Dikes, MD Neurologist [facility] Pager: [phone]  CRITICAL CARE ATTESTATION Performed by: Milon Dikes, MD Total critical care time: 65 minutes Critical care time was exclusive of separately billable procedures and treating other patients and/or supervising APPs/Residents/Students Critical care was necessary to treat or prevent imminent or life-threatening deterioration. This patient is critically ill and at significant risk for neurological worsening and/or death and care requires constant monitoring. Critical care was time spent personally by me on the following activities: development of treatment plan with patient and/or surrogate as well as nursing, discussions with consultants, evaluation of patient's response to treatment, examination of patient, obtaining history from patient or surrogate, ordering and performing treatments and interventions, ordering and review of laboratory studies, ordering and review of radiographic studies, pulse oximetry, re-evaluation of  patient's condition, participation in multidisciplinary rounds and medical decision making of high complexity in the care of this patient.

## 2022-07-15 NOTE — ED Notes (Signed)
Called [facility] for transfer  [phone]

## 2022-07-15 NOTE — ED Notes (Signed)
Called [facility] to check on status, bed still pending assigment

## 2022-07-15 NOTE — ED Notes (Signed)
Initiated code stroke to [facility], [place]  1036

## 2022-07-15 NOTE — ED Notes (Signed)
Pt back from MRI 

## 2022-07-15 NOTE — ED Provider Notes (Signed)
This patient in signout pending transfer to [facility] for neuro interventional been vertebrobasilar insufficiency found on stroke work-up.  He was noted to have left-sided facial droop and left upper and lower extremity weakness on exam from prior notes.  Family left for dinnertime and came back this evening noting that the patient has worsening dysarthria and left-sided facial droop.  I examined the patient and he seems to have left-sided facial droop and some mild dysarthria, consistent with prior note by neurology as well as a mild left lower extremity and upper extremity weakness.  Patient is alert and oriented and denies other complaints, denies headache.  Check fingerstick glucose now. Paged [facility] neurologist Dr Georg Ruddle to discuss worsening neurologic symptoms; given exam findings consistent with prior noted, mild worsening, and known pathology on prior imaging no need for further imaging and certainly risks of thrombolytic therapy at this time outweigh benefits, continue to monitor.   Pilar Jarvis, MD 07/15/22 2149

## 2022-07-16 ENCOUNTER — Encounter (HOSPITAL_COMMUNITY): Payer: Self-pay | Admitting: Internal Medicine

## 2022-07-16 ENCOUNTER — Inpatient Hospital Stay (HOSPITAL_COMMUNITY)
Admission: RE | Admit: 2022-07-16 | Discharge: 2022-07-24 | DRG: 024 | Disposition: A | Discharge status: Rehabilitation Facility | Payer: Medicare HMO | Source: Ambulatory Visit | Attending: Internal Medicine | Admitting: Internal Medicine

## 2022-07-16 ENCOUNTER — Inpatient Hospital Stay (HOSPITAL_COMMUNITY): Payer: Medicare HMO

## 2022-07-16 DIAGNOSIS — G450 Vertebro-basilar artery syndrome: Secondary | ICD-10-CM | POA: Diagnosis present

## 2022-07-16 DIAGNOSIS — R531 Weakness: Secondary | ICD-10-CM | POA: Diagnosis present

## 2022-07-16 DIAGNOSIS — G459 Transient cerebral ischemic attack, unspecified: Secondary | ICD-10-CM | POA: Diagnosis not present

## 2022-07-16 DIAGNOSIS — E1122 Type 2 diabetes mellitus with diabetic chronic kidney disease: Secondary | ICD-10-CM | POA: Diagnosis present

## 2022-07-16 DIAGNOSIS — I10 Essential (primary) hypertension: Secondary | ICD-10-CM | POA: Diagnosis not present

## 2022-07-16 DIAGNOSIS — N182 Chronic kidney disease, stage 2 (mild): Secondary | ICD-10-CM | POA: Diagnosis present

## 2022-07-16 DIAGNOSIS — G8194 Hemiplegia, unspecified affecting left nondominant side: Secondary | ICD-10-CM | POA: Diagnosis present

## 2022-07-16 DIAGNOSIS — I6350 Cerebral infarction due to unspecified occlusion or stenosis of unspecified cerebral artery: Secondary | ICD-10-CM | POA: Diagnosis not present

## 2022-07-16 DIAGNOSIS — R471 Dysarthria and anarthria: Secondary | ICD-10-CM | POA: Diagnosis present

## 2022-07-16 DIAGNOSIS — Z87891 Personal history of nicotine dependence: Secondary | ICD-10-CM | POA: Diagnosis not present

## 2022-07-16 DIAGNOSIS — I6322 Cerebral infarction due to unspecified occlusion or stenosis of basilar arteries: Secondary | ICD-10-CM | POA: Diagnosis present

## 2022-07-16 DIAGNOSIS — I129 Hypertensive chronic kidney disease with stage 1 through stage 4 chronic kidney disease, or unspecified chronic kidney disease: Secondary | ICD-10-CM | POA: Diagnosis present

## 2022-07-16 DIAGNOSIS — R29810 Facial weakness: Secondary | ICD-10-CM | POA: Diagnosis present

## 2022-07-16 DIAGNOSIS — I69354 Hemiplegia and hemiparesis following cerebral infarction affecting left non-dominant side: Secondary | ICD-10-CM | POA: Diagnosis not present

## 2022-07-16 DIAGNOSIS — M109 Gout, unspecified: Secondary | ICD-10-CM | POA: Diagnosis present

## 2022-07-16 DIAGNOSIS — E119 Type 2 diabetes mellitus without complications: Secondary | ICD-10-CM | POA: Diagnosis not present

## 2022-07-16 DIAGNOSIS — R29705 NIHSS score 5: Secondary | ICD-10-CM | POA: Diagnosis present

## 2022-07-16 DIAGNOSIS — Z833 Family history of diabetes mellitus: Secondary | ICD-10-CM

## 2022-07-16 DIAGNOSIS — E785 Hyperlipidemia, unspecified: Secondary | ICD-10-CM | POA: Diagnosis not present

## 2022-07-16 DIAGNOSIS — E1142 Type 2 diabetes mellitus with diabetic polyneuropathy: Secondary | ICD-10-CM | POA: Diagnosis present

## 2022-07-16 DIAGNOSIS — Z79899 Other long term (current) drug therapy: Secondary | ICD-10-CM

## 2022-07-16 DIAGNOSIS — Z7982 Long term (current) use of aspirin: Secondary | ICD-10-CM

## 2022-07-16 DIAGNOSIS — I651 Occlusion and stenosis of basilar artery: Secondary | ICD-10-CM | POA: Diagnosis not present

## 2022-07-16 DIAGNOSIS — Z8673 Personal history of transient ischemic attack (TIA), and cerebral infarction without residual deficits: Secondary | ICD-10-CM

## 2022-07-16 DIAGNOSIS — I679 Cerebrovascular disease, unspecified: Secondary | ICD-10-CM | POA: Diagnosis present

## 2022-07-16 DIAGNOSIS — I639 Cerebral infarction, unspecified: Secondary | ICD-10-CM | POA: Diagnosis not present

## 2022-07-16 DIAGNOSIS — Z7984 Long term (current) use of oral hypoglycemic drugs: Secondary | ICD-10-CM

## 2022-07-16 DIAGNOSIS — F0390 Unspecified dementia without behavioral disturbance: Secondary | ICD-10-CM | POA: Diagnosis present

## 2022-07-16 DIAGNOSIS — E538 Deficiency of other specified B group vitamins: Secondary | ICD-10-CM | POA: Diagnosis present

## 2022-07-16 DIAGNOSIS — E7800 Pure hypercholesterolemia, unspecified: Secondary | ICD-10-CM | POA: Diagnosis present

## 2022-07-16 HISTORY — PX: IR ANGIO VERTEBRAL SEL SUBCLAVIAN INNOMINATE UNI R MOD SED: IMG5365

## 2022-07-16 HISTORY — PX: IR ANGIO VERTEBRAL SEL VERTEBRAL UNI L MOD SED: IMG5367

## 2022-07-16 HISTORY — PX: IR ANGIO INTRA EXTRACRAN SEL COM CAROTID INNOMINATE BILAT MOD SED: IMG5360

## 2022-07-16 LAB — COMPREHENSIVE METABOLIC PANEL
ALT: 12 U/L (ref 0–44)
AST: 13 U/L — ABNORMAL LOW (ref 15–41)
Albumin: 3.6 g/dL (ref 3.5–5.0)
Alkaline Phosphatase: 57 U/L (ref 38–126)
Anion gap: 10 (ref 5–15)
BUN: 18 mg/dL (ref 8–23)
CO2: 25 mmol/L (ref 22–32)
Calcium: 9.1 mg/dL (ref 8.9–10.3)
Chloride: 105 mmol/L (ref 98–111)
Creatinine, Ser: 1.6 mg/dL — ABNORMAL HIGH (ref 0.61–1.24)
GFR, Estimated: 46 mL/min — ABNORMAL LOW (ref >60)
Glucose, Bld: 76 mg/dL (ref 70–99)
Potassium: 3.8 mmol/L (ref 3.5–5.1)
Sodium: 140 mmol/L (ref 135–145)
Total Bilirubin: 0.4 mg/dL (ref 0.3–1.2)
Total Protein: 7.2 g/dL (ref 6.5–8.1)

## 2022-07-16 LAB — GLUCOSE, CAPILLARY
Glucose-Capillary: 82 mg/dL (ref 70–99)
Glucose-Capillary: 74 mg/dL (ref 70–99)
Glucose-Capillary: 101 mg/dL — ABNORMAL HIGH (ref 70–99)
Glucose-Capillary: 113 mg/dL — ABNORMAL HIGH (ref 70–99)
Glucose-Capillary: 84 mg/dL (ref 70–99)
Glucose-Capillary: 148 mg/dL — ABNORMAL HIGH (ref 70–99)
Glucose-Capillary: 95 mg/dL (ref 70–99)

## 2022-07-16 LAB — LIPID PANEL
Cholesterol: 105 mg/dL (ref 0–200)
HDL: 31 mg/dL — ABNORMAL LOW (ref >40)
LDL Cholesterol: 58 mg/dL (ref 0–99)
Total CHOL/HDL Ratio: 3.4 RATIO
Triglycerides: 80 mg/dL (ref <150)
VLDL: 16 mg/dL (ref 0–40)

## 2022-07-16 LAB — CBC
HCT: 40.6 % (ref 39.0–52.0)
Hemoglobin: 13.2 g/dL (ref 13.0–17.0)
MCH: 27.1 pg (ref 26.0–34.0)
MCHC: 32.5 g/dL (ref 30.0–36.0)
MCV: 83.4 fL (ref 80.0–100.0)
Platelets: 313 10*3/uL (ref 150–400)
RBC: 4.87 MIL/uL (ref 4.22–5.81)
RDW: 13.2 % (ref 11.5–15.5)
WBC: 6.1 10*3/uL (ref 4.0–10.5)
nRBC: 0 % (ref 0.0–0.2)

## 2022-07-16 LAB — ECHOCARDIOGRAM COMPLETE
Area-P 1/2: 2.87 cm2
Calc EF: 61.6 %
Height: 69 in
S' Lateral: 2.6 cm
Single Plane A2C EF: 62.5 %
Single Plane A4C EF: 61.1 %
Weight: 2867.74 oz

## 2022-07-16 LAB — RAPID URINE DRUG SCREEN, HOSP PERFORMED
Amphetamines: NOT DETECTED
Barbiturates: NOT DETECTED
Benzodiazepines: NOT DETECTED
Cocaine: NOT DETECTED
Opiates: NOT DETECTED
Tetrahydrocannabinol: NOT DETECTED

## 2022-07-16 LAB — HEMOGLOBIN A1C
Hgb A1c MFr Bld: 6.5 % — ABNORMAL HIGH (ref 4.8–5.6)
Mean Plasma Glucose: 140 mg/dL

## 2022-07-16 LAB — HIV ANTIBODY (ROUTINE TESTING W REFLEX): HIV Screen 4th Generation wRfx: NONREACTIVE

## 2022-07-16 MED ORDER — HEPARIN SODIUM (PORCINE) 1000 UNIT/ML IJ SOLN
INTRAMUSCULAR | Status: AC
  Filled 2022-07-16: qty 1

## 2022-07-16 MED ORDER — MIDAZOLAM HCL 2 MG/2ML IJ SOLN
INTRAMUSCULAR | Status: AC | PRN
  Administered 2022-07-16: 1 mg via INTRAVENOUS

## 2022-07-16 MED ORDER — ATORVASTATIN CALCIUM 80 MG PO TABS
80.0000 mg | ORAL_TABLET | Freq: Every day | ORAL | Status: DC
  Administered 2022-07-17 – 2022-07-24 (×8): 80 mg via ORAL
  Filled 2022-07-16 (×8): qty 1

## 2022-07-16 MED ORDER — SODIUM CHLORIDE 0.9 % IV SOLN
INTRAVENOUS | Status: AC | PRN
  Administered 2022-07-16: 250 mL via INTRAVENOUS

## 2022-07-16 MED ORDER — DEXTROSE-NACL 5-0.9 % IV SOLN: INTRAVENOUS | Status: DC

## 2022-07-16 MED ORDER — ORAL CARE MOUTH RINSE: 15.0000 mL | OROMUCOSAL | Status: DC | PRN

## 2022-07-16 MED ORDER — ACETAMINOPHEN 650 MG RE SUPP: 650.0000 mg | RECTAL | Status: DC | PRN

## 2022-07-16 MED ORDER — ACETAMINOPHEN 160 MG/5ML PO SOLN: 650.0000 mg | ORAL | Status: DC | PRN

## 2022-07-16 MED ORDER — SODIUM CHLORIDE 0.9 % IV SOLN: INTRAVENOUS | Status: DC

## 2022-07-16 MED ORDER — LIDOCAINE HCL 1 % IJ SOLN
INTRAMUSCULAR | Status: AC
  Filled 2022-07-16: qty 20

## 2022-07-16 MED ORDER — FENTANYL CITRATE (PF) 100 MCG/2ML IJ SOLN
INTRAMUSCULAR | Status: AC | PRN
  Administered 2022-07-16: 25 ug via INTRAVENOUS

## 2022-07-16 MED ORDER — HEPARIN (PORCINE) 25000 UT/250ML-% IV SOLN
1200.0000 [IU]/h | INTRAVENOUS | Status: DC
  Administered 2022-07-16: 1200 [IU]/h via INTRAVENOUS
  Filled 2022-07-16: qty 250

## 2022-07-16 MED ORDER — INSULIN ASPART 100 UNIT/ML IJ SOLN
0.0000 [IU] | INTRAMUSCULAR | Status: DC
  Administered 2022-07-16: 1 [IU] via SUBCUTANEOUS

## 2022-07-16 MED ORDER — ASPIRIN 325 MG PO TBEC
325.0000 mg | DELAYED_RELEASE_TABLET | Freq: Every day | ORAL | Status: DC
  Administered 2022-07-16 – 2022-07-17 (×2): 325 mg via ORAL
  Filled 2022-07-16 (×2): qty 1

## 2022-07-16 MED ORDER — SENNOSIDES-DOCUSATE SODIUM 8.6-50 MG PO TABS: 1.0000 | ORAL_TABLET | Freq: Every evening | ORAL | Status: DC | PRN

## 2022-07-16 MED ORDER — ACETAMINOPHEN 325 MG PO TABS
650.0000 mg | ORAL_TABLET | ORAL | Status: DC | PRN
  Administered 2022-07-21 – 2022-07-22 (×3): 650 mg via ORAL
  Filled 2022-07-16 (×3): qty 2

## 2022-07-16 MED ORDER — HEPARIN SODIUM (PORCINE) 1000 UNIT/ML IJ SOLN
INTRAMUSCULAR | Status: AC | PRN
  Administered 2022-07-16: 1000 [IU] via INTRAVENOUS

## 2022-07-16 MED ORDER — FENTANYL CITRATE (PF) 100 MCG/2ML IJ SOLN
INTRAMUSCULAR | Status: AC
  Filled 2022-07-16: qty 2

## 2022-07-16 MED ORDER — STROKE: EARLY STAGES OF RECOVERY BOOK
Freq: Once | Status: AC
  Filled 2022-07-16: qty 1

## 2022-07-16 MED ORDER — IOHEXOL 300 MG/ML  SOLN
150.0000 mL | Freq: Once | INTRAMUSCULAR | Status: AC | PRN
  Administered 2022-07-16: 70 mL via INTRA_ARTERIAL

## 2022-07-16 MED ORDER — ATORVASTATIN CALCIUM 40 MG PO TABS
40.0000 mg | ORAL_TABLET | Freq: Every day | ORAL | Status: DC
  Administered 2022-07-16: 40 mg via ORAL
  Filled 2022-07-16: qty 1

## 2022-07-16 MED ORDER — MIDAZOLAM HCL 2 MG/2ML IJ SOLN
INTRAMUSCULAR | Status: AC
  Filled 2022-07-16: qty 2

## 2022-07-16 MED ORDER — CLOPIDOGREL BISULFATE 75 MG PO TABS
75.0000 mg | ORAL_TABLET | Freq: Every day | ORAL | Status: DC
  Administered 2022-07-16 – 2022-07-18 (×3): 75 mg via ORAL
  Filled 2022-07-16 (×3): qty 1

## 2022-07-16 MED ORDER — ASPIRIN 81 MG PO CHEW: 81.0000 mg | CHEWABLE_TABLET | Freq: Every day | ORAL | Status: DC

## 2022-07-16 NOTE — Progress Notes (Signed)
ANTICOAGULATION CONSULT NOTE - Initial Consult  Pharmacy Consult for heparin Indication: stroke  No Known Allergies  Patient Measurements: Height: 5\' 9"  (175.3 cm) (per pt) Weight: 81.3 kg (179 lb 3.7 oz) IBW/kg (Calculated) : 70.7 Heparin Dosing Weight: 81kg  Vital Signs: Temp: 98.1 F (36.7 C) (11/28 1602) Temp Source: Oral (11/28 1602) BP: 155/62 (11/28 1747) Pulse Rate: 50 (11/28 1747)  Labs: Recent Labs    07/15/22 1040 07/15/22 1442 07/16/22 0510  HGB 13.6  --  13.2  HCT 42.4  --  40.6  PLT 313  --  313  APTT 28  --   --   LABPROT 13.4  --   --   INR 1.0  --   --   CREATININE 1.64*  --  1.60*  TROPONINIHS 6 8  --     Estimated Creatinine Clearance: 43 mL/min (A) (by C-G formula based on SCr of 1.6 mg/dL (H)).   Medical History: Past Medical History:  Diagnosis Date   CVA (cerebral infarction)    Diabetes mellitus without complication (HCC)    Gout    Hyperlipidemia    Hypertension    Memory loss    Stroke [facility])    Wears dentures    partial upper    Medications:  Medications Prior to Admission  Medication Sig Dispense Refill Last Dose   amLODipine (NORVASC) 5 MG tablet Take 5 mg by mouth daily.   07/15/2022   aspirin EC 81 MG tablet Take 81 mg by mouth daily.   07/15/2022   atorvastatin (LIPITOR) 10 MG tablet Take 10 mg by mouth daily.   07/15/2022   clonazePAM (KLONOPIN) 0.5 MG tablet Take 0.5 mg by mouth at bedtime as needed for anxiety.   Past Week   [facility] VITAMIN B12 1000 MCG tablet Take 1,000 mcg by mouth daily.   07/15/2022   cyanocobalamin 1000 MCG tablet Take 1 tablet by mouth daily.   07/15/2022   donepezil (ARICEPT) 10 MG tablet Take 10 mg by mouth at bedtime.   Past Week   glipiZIDE (GLUCOTROL XL) 5 MG 24 hr tablet Take 5 mg by mouth daily with breakfast.   07/15/2022   lisinopril (ZESTRIL) 40 MG tablet Take 40 mg by mouth daily.   07/15/2022   memantine (NAMENDA) 10 MG tablet Take 10 mg by mouth 2 (two) times daily.   07/15/2022    metFORMIN (GLUCOPHAGE-XR) 500 MG 24 hr tablet Take 500 mg by mouth 2 (two) times daily with a meal.   07/15/2022   famotidine (PEPCID) 20 MG tablet Take 1 tablet (20 mg total) by mouth 2 (two) times daily. (Patient not taking: Reported on 07/15/2022) 60 tablet 0 Not Taking   ondansetron (ZOFRAN ODT) 4 MG disintegrating tablet Take 1 tablet (4 mg total) by mouth every 8 (eight) hours as needed for nausea or vomiting. (Patient not taking: Reported on 07/16/2022) 20 tablet 0 Not Taking   Scheduled:   [START ON 07/17/2022]  stroke: early stages of recovery book   Does not apply Once   aspirin EC  325 mg Oral Daily   [START ON 07/17/2022] atorvastatin  80 mg Oral Daily   clopidogrel  75 mg Oral Daily   fentaNYL       heparin sodium (porcine)       insulin aspart  0-9 Units Subcutaneous Q4H   lidocaine       midazolam       Infusions:   sodium chloride 75 mL/hr at 07/16/22 1855  heparin      Assessment: Pt presented to [facility] with CVA. Tx here for cerebral arteriogram with plan to do angioplasty/stenting this Thursday. Heparin for anticoagulation.    Goal of Therapy:  Heparin level 0.3-0.5 units/ml Monitor platelets by anticoagulation protocol: Yes   Plan:  Heparin infusion at 1200 units/hr AM hep level and CBC then daily  Ulyses Southward, PharmD, BCIDP, AAHIVP, CPP Infectious Disease Pharmacist 07/16/2022 7:05 PM

## 2022-07-16 NOTE — Progress Notes (Signed)
Follow up after angiogram today to discuss findings with pt and wqife. Pt resting comfortably.  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  Needs intervention with angioplasty/stenting. Explained procedure in detail including risks/complications of bleeding, infection, renal failure, stroke 5-6%, death.  Procedure will need to be done under general anesthesia. Will begin coordinating for case to be done Thursday. P2Y12 pending, continue Plavix and ASA but may need to switch to Brilinta.  Brayton El PA-C Interventional Radiology 07/16/2022 4:12 PM

## 2022-07-16 NOTE — Procedures (Signed)
INR.  Four-vessel cerebral arteriogram.  Right CFA approach. Findings. 1.  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 2.  50% stenosis at the origin of the of the dominant left vertebral artery. 3.  Approximately 30% stenosis at the origin of the nondominant right vertebral artery. 4.  Tapered stenosis of the nondominant right vertebral basal junction. 5.  50% stenosis of the right internal carotid artery supraclinoid segment. 6.  Poor filling of the PCAs bilaterally due to the severe proximal stenosis of the basilar artery. 6.Moderate  to  severe stenosis of the proximal left ACA A1 segment. Fatima Sanger MD.

## 2022-07-16 NOTE — Plan of Care (Signed)
  Problem: Education: Goal: Knowledge of General Education information will improve Description: Including pain rating scale, medication(s)/side effects and non-pharmacologic comfort measures Outcome: Progressing   Problem: Health Behavior/Discharge Planning: Goal: Ability to manage health-related needs will improve Outcome: Progressing   Problem: Clinical Measurements: Goal: Ability to maintain clinical measurements within normal limits will improve Outcome: Progressing Goal: Will remain free from infection Outcome: Progressing Goal: Diagnostic test results will improve Outcome: Progressing Goal: Respiratory complications will improve Outcome: Progressing Goal: Cardiovascular complication will be avoided Outcome: Progressing   Problem: Clinical Measurements: Goal: Ability to maintain clinical measurements within normal limits will improve Outcome: Progressing Goal: Will remain free from infection Outcome: Progressing Goal: Diagnostic test results will improve Outcome: Progressing Goal: Respiratory complications will improve Outcome: Progressing Goal: Cardiovascular complication will be avoided Outcome: Progressing   Problem: Coping: Goal: Level of anxiety will decrease Outcome: Progressing   Problem: Pain Managment: Goal: General experience of comfort will improve Outcome: Progressing   Problem: Education: Goal: Knowledge of disease or condition will improve Outcome: Progressing Goal: Knowledge of secondary prevention will improve (MUST DOCUMENT ALL) Outcome: Progressing Goal: Knowledge of patient specific risk factors will improve Loraine Leriche N/A or DELETE if not current risk factor) Outcome: Progressing

## 2022-07-16 NOTE — Progress Notes (Addendum)
STROKE TEAM PROGRESS NOTE   ATTENDING NOTE: I reviewed above note and agree with the assessment and plan. Pt was seen and examined.   71 year old male with history of hypertension, hyperlipidemia, stroke without residual initially presented to [facility] for imbalance, generalized weakness, difficulty walking.  Also found to have left facial droop and ataxia.  However able to move arm and leg equally.  CT no acute finding, but old right cerebellar and left BG lacunar infarct.  MRI no acute finding.  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  He was transferred from [facility] to [facility] for cerebral angiogram and further management.  Overnight he developed left-sided weakness and worsening speech.  EF 60 to 65%, LDL 58, A1c pending and UDS negative.  Cerebral angiogram showed 50% stenosis left VA origin, preocclusive mid basilar and proximal basilar artery stenosis, tapered stenosis of the nondominant right vertebral basal junction. 50% stenosis of the right internal carotid artery supraclinoid segment. Poor filling of the PCAs bilaterally due to the severe proximal stenosis of the basilar artery. Moderate  to  severe stenosis of the proximal left ACA A1 segment.  Post procedure, on my exam, patient also developed right upper quadrantanopsia and lower visual field decreased visual acuity.  On exam, patient lying in bed, awake alert, eyes open, wife at the bedside.  Orientated to place, people and time but severe dysarthria with paraphasic errors.  However no aphasia, follows simple commands.  Able to name and repeat short sentences.  Right upper quadrantanopsia, right lower visual field decreased visual acuity.  Left facial droop, left upper extremity 3 -/5 proximal and distal, left lower extremity 3/5 proximal and 4/5 distally.  Sensation symmetrical.  Right finger-to-nose intact.  Etiology for patient stroke  likely due to posterior circulation severe stenosis.  With progression of neurological deficit, discussed with Dr. Corliss Skains, well put on heparin IV per stroke protocol.  Continue aspirin 325 and Plavix.  Will repeat MRI to confirm stroke.  Planning for left artery stenting in 2 days.  Check P2 Y12.  Bedrest, BP goal 140-180 before intervention given basilar artery stenosis.  Avoid low BP.  PT/OT pending.  Will follow.  For detailed assessment and plan, please refer to above/below as I have made changes wherever appropriate.   I discussed with Dr. Corliss Skains. I spent extensive face-to-face time with the patient and his wife, more than 50% of which was spent in counseling and coordination of care, reviewing test results, images and medication, and discussing the diagnosis, treatment plan and potential prognosis. This patient's care requiresreview of multiple databases, neurological assessment, discussion with family, other specialists and medical decision making of high complexity.  Marvel Plan, MD PhD Stroke Neurology 07/16/2022 6:51 PM    INTERVAL HISTORY Patient is seen in his room with no family at the bedside.  On 07/14/2022, he started having difficulty with ambulation and maintaining equilibrium.  He did not immediately seek treatment for this, but noticed yesterday that symptoms were recurring when he attempted to walk.  Upon arrival to the ED, he was found to have left facial droop as well as dysmetria.  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  He will be evaluated by Dr. Corliss Skains for for angiogram today.  Vitals:   07/16/22 0309 07/16/22 0448 07/16/22 0736 07/16/22 1110  BP:  (!) 140/68 Marland Kitchen)  151/71 (!) 165/73  Pulse: 85  (!) 51 (!) 52  Resp:  16 18 20   Temp:   98.1 F (36.7 C) 98.4 F (36.9 C)  TempSrc:   Oral Oral  SpO2:  98% 98% 98%  Weight:      Height:       CBC:  Recent Labs  Lab  07/15/22 1040 07/16/22 0510  WBC 6.0 6.1  NEUTROABS 3.4  --   HGB 13.6 13.2  HCT 42.4 40.6  MCV 82.7 83.4  PLT 313 [id number]   Basic Metabolic Panel:  Recent Labs  Lab 07/15/22 1040 07/16/22 0510  NA 141 140  K 4.0 3.8  CL 106 105  CO2 25 25  GLUCOSE 127* 76  BUN 22 18  CREATININE 1.64* 1.60*  CALCIUM 9.3 9.1   Lipid Panel:  Recent Labs  Lab 07/16/22 0510  CHOL 105  TRIG 80  HDL 31*  CHOLHDL 3.4  VLDL 16  LDLCALC 58   HgbA1c: No results for input(s): "HGBA1C" in the last 168 hours. Urine Drug Screen:  Recent Labs  Lab 07/16/22 0709  LABOPIA NONE DETECTED  COCAINSCRNUR NONE DETECTED  LABBENZ NONE DETECTED  AMPHETMU NONE DETECTED  THCU NONE DETECTED  LABBARB NONE DETECTED    Alcohol Level  Recent Labs  Lab 07/15/22 1040  ETH <10    IMAGING past 24 hours ECHOCARDIOGRAM COMPLETE  Result Date: 07/16/2022    ECHOCARDIOGRAM REPORT   Patient Name:   Mitchell Washington Date of Exam: 07/16/2022 Medical Rec #:  [id number]        Height:       69.0 in Accession #:    [id number]       Weight:       179.2 lb Date of Birth:  30-Jun-1951         BSA:          1.972 m Patient Age:    48 years         BP:           151/71 mmHg Patient Gender: M                HR:           50 bpm. Exam Location:  Inpatient Procedure: 2D Echo, Cardiac Doppler and Color Doppler Indications:    TIA G45.9  History:        Patient has no prior history of Echocardiogram examinations.                 Stroke; Risk Factors:Hypertension, Dyslipidemia and Diabetes.  Sonographer:    Bernadene Person RDCS Referring Phys: [id number] [place]  1. Left ventricular ejection fraction, by estimation, is 60 to 65%. The left ventricle has normal function. The left ventricle has no regional wall motion abnormalities. Left ventricular diastolic parameters are indeterminate.  2. Right ventricular systolic function is normal. The right ventricular size is normal. Tricuspid regurgitation signal is inadequate for  assessing PA pressure.  3. Left atrial size was mildly dilated.  4. The mitral valve is normal in structure. Trivial mitral valve regurgitation. No evidence of mitral stenosis.  5. The aortic valve is tricuspid. There is mild thickening of the aortic valve. Aortic valve regurgitation is not visualized. Aortic valve sclerosis is present, with no evidence of aortic valve stenosis.  6. The inferior vena cava is normal in size with greater than 50% respiratory variability, suggesting right atrial pressure of 3 mmHg. FINDINGS  Left  Ventricle: Left ventricular ejection fraction, by estimation, is 60 to 65%. The left ventricle has normal function. The left ventricle has no regional wall motion abnormalities. The left ventricular internal cavity size was normal in size. There is  no left ventricular hypertrophy. Left ventricular diastolic parameters are indeterminate. Right Ventricle: The right ventricular size is normal. No increase in right ventricular wall thickness. Right ventricular systolic function is normal. Tricuspid regurgitation signal is inadequate for assessing PA pressure. Left Atrium: Left atrial size was mildly dilated. Right Atrium: Right atrial size was normal in size. Pericardium: There is no evidence of pericardial effusion. Mitral Valve: The mitral valve is normal in structure. Mild mitral annular calcification. Trivial mitral valve regurgitation. No evidence of mitral valve stenosis. Tricuspid Valve: The tricuspid valve is normal in structure. Tricuspid valve regurgitation is not demonstrated. Aortic Valve: The aortic valve is tricuspid. There is mild thickening of the aortic valve. Aortic valve regurgitation is not visualized. Aortic valve sclerosis is present, with no evidence of aortic valve stenosis. Pulmonic Valve: The pulmonic valve was grossly normal. Pulmonic valve regurgitation is not visualized. Aorta: The aortic root and ascending aorta are structurally normal, with no evidence of  dilitation. Venous: The inferior vena cava is normal in size with greater than 50% respiratory variability, suggesting right atrial pressure of 3 mmHg. IAS/Shunts: No atrial level shunt detected by color flow Doppler.  LEFT VENTRICLE PLAX 2D LVIDd:         4.50 cm     Diastology LVIDs:         2.60 cm     LV e' medial:    5.76 cm/s LV PW:         0.90 cm     LV E/e' medial:  14.7 LV IVS:        0.90 cm     LV e' lateral:   8.43 cm/s LVOT diam:     2.00 cm     LV E/e' lateral: 10.1 LV SV:         69 LV SV Index:   35 LVOT Area:     3.14 cm  LV Volumes (MOD) LV vol d, MOD A2C: 76.2 ml LV vol d, MOD A4C: 93.5 ml LV vol s, MOD A2C: 28.6 ml LV vol s, MOD A4C: 36.4 ml LV SV MOD A2C:     47.6 ml LV SV MOD A4C:     93.5 ml LV SV MOD BP:      52.2 ml RIGHT VENTRICLE RV S prime:     11.60 cm/s TAPSE (M-mode): 1.9 cm LEFT ATRIUM             Index        RIGHT ATRIUM           Index LA diam:        3.90 cm 1.98 cm/m   RA Area:     12.70 cm LA Vol (A2C):   26.7 ml 13.54 ml/m  RA Volume:   24.60 ml  12.47 ml/m LA Vol (A4C):   29.2 ml 14.81 ml/m LA Biplane Vol: 27.9 ml 14.15 ml/m  AORTIC VALVE LVOT Vmax:   88.80 cm/s LVOT Vmean:  55.300 cm/s LVOT VTI:    0.220 m  AORTA Ao Root diam: 2.90 cm Ao Asc diam:  3.30 cm MITRAL VALVE MV Area (PHT): 2.87 cm    SHUNTS MV Decel Time: 264 msec    Systemic VTI:  0.22 m MV E velocity: 84.90 cm/s  Systemic Diam: 2.00 cm MV A velocity: 82.30 cm/s MV E/A ratio:  1.03 Mihai Croitoru MD Electronically signed by Sanda Klein MD Signature Date/Time: 07/16/2022/12:32:57 PM    Final     PHYSICAL EXAM General: Alert, well-nourished, well-developed elderly patient in no acute distress Respiratory: Regular, unlabored respirations on room air  NEURO:  Mental Status: AA&Ox3  Speech/Language: speech is without dysarthria or aphasia.  Fluency, and comprehension intact.  Cranial Nerves:  II: PERRL. Visual fields full.   III, IV, VI: EOMI. Eyelids elevate symmetrically.  V: Sensation is  intact to light touch and symmetrical to face.  VII: Smile is symmetrical.  VIII: hearing intact to voice. IX, X: Phonation is normal.  [id number] shrug 5/5. XII: tongue is midline without fasciculations. Motor: 5/5 strength to all muscle groups tested.  Tone: is normal and bulk is normal Sensation- Intact to light touch bilaterally.  Coordination: FTN with ataxia on the left, HKS: no ataxia in BLE.No drift.  Gait- deferred   ASSESSMENT/PLAN Mitchell Washington is a 71 y.o. male with history of stroke, hypertension, hyperlipidemia, memory loss, REM sleep behavior disorder, tremor and dementia presenting with  difficulty with ambulation and maintaining equilibrium which started on 07/14/2022.  He did not immediately seek treatment for this, but noticed yesterday that symptoms were recurring when he attempted to walk.  Upon arrival to the ED, he was found to have left facial droop as well as dysmetria.  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  He will be evaluated by Dr. Estanislado Pandy for for angiogram today.  Stroke: posterior stroke due to severe posterior circulation stenosis Code Stroke CT head No acute abnormality.  Multiple chronic infarcts involving left basal ganglia and posterior circulation.  ASPECTS 10.    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 noted on CTA Multiple chronic infarcts noted in posterior circulation Patient to be evaluated today with angiogram  possible stenting in 2 days  Hypertension Home meds: Amlodipine 5 mg daily, lisinopril 40 mg daily Stable Keep SBP less than 180 Long-term BP goal normotensive  Hyperlipidemia Home meds: Atorvastatin 10 mg daily LDL 58, goal < 70 increased to 80 mg daily Continue statin at discharge  Other Stroke Risk Factors Advanced Age >/= 59  Former cigarette smoker Hx stroke  Other Active Problems None  Hospital day # [place] , MSN, AGACNP-BC [facility] See Amion for schedule and pager information 07/16/2022 1:04 PM    To contact Stroke Continuity provider, please refer to [email or web address] After hours, contact General Neurology

## 2022-07-16 NOTE — Progress Notes (Signed)
On admission assessment, drift and ataxia noted in left leg. Left hand grasp slightly weaker than right but no drift/ataxia in left arm on admit.   When hospitalist at bedside around 0430, noted left leg drift and ataxia resolved and now having drift in left arm and ataxia with finger to nose test on left arm. No other neuro changes. Hospitalist notified of differences in assessments. No new orders received at this time.  Later hospitalist asked RN to contact neuro MD to see if repeat head CT recommended. RN spoke with night neuro MD around 0630 and discussed above. Per neuro MD no repeat head CT at this time but per MD do neuro checks every hour until seen by day neurologist and inform MD if SBP <120. Day RN notified of this in report.

## 2022-07-16 NOTE — Progress Notes (Signed)
SLP Cancellation Note  Patient Details Name: LATRON RIBAS MRN: [id number] DOB: [birth year]   Cancelled treatment:       Reason Eval/Treat Not Completed: Medical issues which prohibited therapy. Pt potentially planned for IR procedure. Discussed with PA who reports pts should remain NPO. Will Fu tomorrow for swallow assessment. Repeat [facility] after procedure would also be appropriate if diet needed prior to SLP return.   Nicholle Falzon, Riley Nearing 07/16/2022, 11:15 AM

## 2022-07-16 NOTE — Consult Note (Signed)
Chief Complaint: Patient was seen in consultation today for posterior circulation CVA with VBI/severe stenosis   Referring Physician(s): Dr. Rory Percy Dr. Erlinda Hong  Supervising Physician: Luanne Bras  Patient Status: [facility] - [facility]-pt  History of Present Illness: Mitchell Washington is a 71 y.o. male presented to [facility] with symptoms of generalized weakness and disequilibrium. Prior hx of CVA with no residual sxs per wife. In addition to disequilibrium, wife also reports significant change in speech.  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 He was given loading dose of Plavix and ASA. Has been transferred to [facility] for NIR consult and possible intervention. Pt seen with Dr. Estanislado Pandy.   Past Medical History:  Diagnosis Date   CVA (cerebral infarction)    Diabetes mellitus without complication ([place])    Gout    Hyperlipidemia    Hypertension    Memory loss    Stroke [facility])    Wears dentures    partial upper    Past Surgical History:  Procedure Laterality Date   APPENDECTOMY     CATARACT EXTRACTION W/PHACO Left 10/25/2019   Procedure: CATARACT EXTRACTION PHACO AND INTRAOCULAR LENS PLACEMENT (IOC) LEFT DIABETIC INTRAVEITREAL KENALOG INJECTION 1.93  00:22.6;  Surgeon: Eulogio Bear, MD;  Location: [place];  Service: Ophthalmology;  Laterality: Left;  Diabetic - oral meds   CATARACT EXTRACTION W/PHACO Right 11/15/2019   Procedure: CATARACT EXTRACTION PHACO AND INTRAOCULAR LENS PLACEMENT ([place]) RIGHT DIABETIC;  Surgeon: Eulogio Bear, MD;  Location: [place];  Service: Ophthalmology;  Laterality: Right;  0.92 0 ;19.7    Allergies: Patient has no known allergies.  Medications: Prior to Admission medications   Medication Sig Start Date End Date Taking? Authorizing Provider  amLODipine (NORVASC) 5 MG tablet Take 5 mg by mouth daily. 01/10/20  Yes [provider]   aspirin EC 81 MG tablet Take 81 mg by mouth daily.   Yes [provider]  atorvastatin (LIPITOR) 10 MG tablet Take 10 mg by mouth daily. 07/06/18  Yes [provider]  clonazePAM (KLONOPIN) 0.5 MG tablet Take 0.5 mg by mouth at bedtime as needed for anxiety. 03/17/22  Yes [provider]  [employer] VITAMIN B12 1000 MCG tablet Take 1,000 mcg by mouth daily. 09/21/18  Yes [provider]  cyanocobalamin 1000 MCG tablet Take 1 tablet by mouth daily. 08/22/20  Yes [provider]  donepezil (ARICEPT) 10 MG tablet Take 10 mg by mouth at bedtime. 08/27/18  Yes [provider]  glipiZIDE (GLUCOTROL XL) 5 MG 24 hr tablet Take 5 mg by mouth daily with breakfast. 01/03/20 07/16/22 Yes [provider]  lisinopril (ZESTRIL) 40 MG tablet Take 40 mg by mouth daily. 09/23/21  Yes [provider]  memantine (NAMENDA) 10 MG tablet Take 10 mg by mouth 2 (two) times daily. 12/20/19  Yes [provider]  metFORMIN (GLUCOPHAGE-XR) 500 MG 24 hr tablet Take 500 mg by mouth 2 (two) times daily with a meal. 01/15/18 07/16/22 Yes [provider]  famotidine (PEPCID) 20 MG tablet Take 1 tablet (20 mg total) by mouth 2 (two) times daily. Patient not taking: Reported on 07/15/2022 01/21/21   Carrie Mew, MD  ondansetron (ZOFRAN ODT) 4 MG disintegrating tablet Take 1 tablet (4 mg total) by mouth every 8 (eight) hours as needed for nausea or vomiting. Patient not taking: Reported on 07/16/2022 01/21/21   Carrie Mew, MD     Family  History  Problem Relation Age of Onset   Diabetes Mother     Social History   Socioeconomic History   Marital status: Married    Spouse name: Not on file   Number of children: Not on file   Years of education: Not on file   Highest education level: Not on file  Occupational History   Not on file  Tobacco Use   Smoking status: Former   Smokeless tobacco: Never  Vaping Use   Vaping Use: Never used   Substance and Sexual Activity   Alcohol use: Not Currently    Alcohol/week: 0.0 standard drinks of alcohol   Drug use: Never   Sexual activity: Not on file  Other Topics Concern   Not on file  Social History Narrative   Not on file   Social Determinants of Health   Financial Resource Strain: Not on file  Food Insecurity: No Food Insecurity (07/16/2022)   Hunger Vital Sign    Worried About Running Out of Food in the Last Year: Never true    Ran Out of Food in the Last Year: Never true  Transportation Needs: No Transportation Needs (07/16/2022)   PRAPARE - Administrator, Civil Service (Medical): No    Lack of Transportation (Non-Medical): No  Physical Activity: Not on file  Stress: Not on file  Social Connections: Not on file    Review of Systems: A 12 point ROS discussed and pertinent positives are indicated in the HPI above.  All other systems are negative.  Review of Systems  Vital Signs: BP (!) 151/71 (BP Location: Left Arm)   Pulse (!) 51   Temp 98.1 F (36.7 C) (Oral)   Resp 18   Ht 5\' 9"  (1.753 m) Comment: per pt  Wt 179 lb 3.7 oz (81.3 kg)   SpO2 98%   BMI 26.47 kg/m   Physical Exam Neuro: Awake alert  Speech is dysarthric  PERRL, EOMI, visual fields appear full,  Mild lower facial asymmetry Some drift on left slightly weaker in the left leg 4+/5 strength whereas all the other extremities are 5/5.   Imaging: MR BRAIN WO CONTRAST  Result Date: 07/15/2022 CLINICAL DATA:  Neuro deficit, acute, stroke suspected. Gait disturbance. Headache. Dementia. EXAM: MRI HEAD WITHOUT CONTRAST TECHNIQUE: Multiplanar, multiecho pulse sequences of the brain and surrounding structures were obtained without intravenous contrast. COMPARISON:  CT studies earlier same day FINDINGS: Brain: Diffusion imaging does not show any acute or subacute infarction. No focal abnormality affects the brainstem. There are numerous old small vessel cerebellar infarctions. Cerebral  hemispheres show old infarctions in the PCA territories more extensive on the left than the right with encephalomalacia and gliosis. There is old infarction in the left basal ganglia and external capsule region. Mild chronic small-vessel ischemic changes seen elsewhere within the hemispheric white matter. Hemosiderin deposition is present in association with the old left basal ganglia/external capsule infarction. No mass lesion, recent hemorrhage, hydrocephalus or extra-axial collection. Vascular: Major vessels at the base of the brain show flow. Skull and upper cervical spine: Negative Sinuses/Orbits: Small amount of mucoid fluid in the left division of the sphenoid sinus. Mild mucosal thickening elsewhere consistent with seasonal rhinitis. Other: None IMPRESSION: 1. No acute or reversible finding. 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 Mild chronic small-vessel ischemic changes elsewhere affecting the brain as outlined above. 2. Mild mucosal thickening and mucoid fluid in the left division of the  sphenoid sinus consistent with seasonal rhinitis. Electronically Signed   By: Paulina Fusi M.D.   On: 07/15/2022 12:59   CT ANGIO HEAD NECK W WO CM W PERF (CODE STROKE)  Result Date: 07/15/2022 CLINICAL DATA:  Neuro deficit, acute, stroke suspected. Ataxia, slurred speech, left-sided weakness, and facial droop. EXAM: CT ANGIOGRAPHY HEAD AND NECK CT PERFUSION BRAIN TECHNIQUE: Multidetector CT imaging of the head and neck was performed using the standard protocol during bolus administration of intravenous contrast. Multiplanar CT image reconstructions and MIPs were obtained to evaluate the vascular anatomy. Carotid stenosis measurements (when applicable) are obtained utilizing NASCET criteria, using the distal internal carotid diameter as the denominator. Multiphase CT imaging of the brain was performed following IV bolus contrast  injection. Subsequent parametric perfusion maps were calculated using RAPID software. RADIATION DOSE REDUCTION: This exam was performed according to the departmental dose-optimization program which includes automated exposure control, adjustment of the mA and/or kV according to patient size and/or use of iterative reconstruction technique. CONTRAST:  OMNIPAQUE IOHEXOL 350 MG/ML SOLN COMPARISON:  None Available. FINDINGS: CTA NECK FINDINGS Aortic arch: Normal variant aortic arch branching pattern with common origin of the brachiocephalic and left common carotid arteries. Mild atherosclerosis without a significant stenosis of the arch vessel origins. Right carotid system: Patent with a small amount of calcified and soft plaque at the carotid bifurcation. No evidence of a significant stenosis or dissection. Left carotid system: Patent with a small amount of calcified and soft plaque in the distal common carotid artery. No evidence of a significant stenosis or dissection. Vertebral arteries: Patent with the left being dominant. Plaque at the vertebral origins results in mild stenosis on the right and moderate stenosis on the left. Mild multifocal irregular narrowing of the right V2 segment. Skeleton: Mild cervical spondylosis. Other neck: No evidence of cervical lymphadenopathy or mass. Upper chest: Clear lung apices. Review of the MIP images confirms the above findings CTA HEAD FINDINGS Anterior circulation: The internal carotid arteries are patent from skull base to carotid termini with moderate to severe paraclinoid stenoses bilaterally. ACAs and MCAs are patent with moderate branch vessel atherosclerotic irregularity as well as severe irregular narrowing of the left A1 segment. No aneurysm is identified. Posterior circulation: The intracranial vertebral arteries are patent to the basilar with severe stenoses bilaterally. The basilar artery is patent with a severe stenosis of its proximal to midportion  (immediately distal to the right AICA origin) and a moderate stenosis distally. Both PCAs are occluded at the P1 levels. There may be a small right posterior communicating artery, and there is some reconstitution of distal PCA branches primarily on the right. No aneurysm is identified. Venous sinuses: Patent. Anatomic variants: None. Review of the MIP images confirms the above findings CT Brain Perfusion Findings: ASPECTS: 10 CBF (<30%) Volume: 0 mL Perfusion (Tmax>6.0s) volume: 24 mL, located in both occipital lobes (PCA territories) Mismatch Volume: 24 mL Infarction Location: No acute core infarct identified by CTP. The study was reviewed by telephone with Dr. Wilford Corner on 07/15/2022 at 11:10 a.m. IMPRESSION: 1. Bilateral proximal PCA occlusion. 2. 24 mL of reported penumbra in the occipital lobes which largely corresponds to chronic infarcts. 3. 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 4. Mild cervical carotid artery atherosclerosis without significant stenosis. 5. Moderate left and mild right vertebral artery origin stenoses. 6.  Aortic Atherosclerosis (ICD10-I70.0). Electronically Signed   By: Sebastian Ache M.D.   On: 07/15/2022  11:48   CT HEAD CODE STROKE WO CONTRAST  Result Date: 07/15/2022 CLINICAL DATA:  Code stroke. Neuro deficit, acute, stroke suspected. Ataxia, slurred speech, left-sided weakness, and facial droop. EXAM: CT HEAD WITHOUT CONTRAST TECHNIQUE: Contiguous axial images were obtained from the base of the skull through the vertex without intravenous contrast. RADIATION DOSE REDUCTION: This exam was performed according to the departmental dose-optimization program which includes automated exposure control, adjustment of the mA and/or kV according to patient size and/or use of iterative reconstruction technique. COMPARISON:  Head CT 01/21/2021 FINDINGS: Brain: There is no evidence of an acute infarct,  intracranial hemorrhage, mass, midline shift, or extra-axial fluid collection. A chronic left basal ganglia infarct, small right and large left chronic PCA infarcts, and small chronic bilateral cerebellar infarcts were present in 2022. There is ex vacuo dilatation of the left lateral ventricle. Hypodensities elsewhere in the cerebral white matter are unchanged and nonspecific but compatible with mild chronic small vessel ischemic disease. Vascular: Calcified atherosclerosis at the skull base. No hyperdense vessel. Skull: No acute fracture or suspicious osseous lesion. Sinuses/Orbits: Mild mucosal thickening in the paranasal sinuses. Clear mastoid air cells. Bilateral cataract extraction. Other: Mild left parietal scalp scarring. ASPECTS ([place] Stroke Program Early CT Score) (right MCA territory) - Ganglionic level infarction (caudate, lentiform nuclei, internal capsule, insula, M1-M3 cortex): 7 - Supraganglionic infarction (M4-M6 cortex): 3 Total score (0-10 with 10 being normal): 10 The study was reviewed by telephone with Dr. Rory Percy on 07/15/2022 at 11:10 a.m. IMPRESSION: 1. No evidence of acute intracranial abnormality. ASPECTS of 10. 2. 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 Electronically Signed   By: Logan Bores M.D.   On: 07/15/2022 11:22   DG Chest Portable 1 View  Result Date: 07/15/2022 CLINICAL DATA:  Weakness EXAM: PORTABLE CHEST 1 VIEW COMPARISON:  Chest radiograph 02/20/2021 FINDINGS: The cardiomediastinal silhouette is normal. There is no focal consolidation or pulmonary edema. There is no pleural effusion or pneumothorax There is no acute osseous abnormality. IMPRESSION: No radiographic evidence of acute cardiopulmonary process. Electronically Signed   By: Valetta Mole M.D.   On: 07/15/2022 11:19    Labs:  CBC: Recent Labs    07/15/22 1040 07/16/22 0510  WBC 6.0 6.1  HGB 13.6 13.2  HCT 42.4 40.6  PLT 313 313    COAGS: Recent Labs     07/15/22 1040  INR 1.0  APTT 28    BMP: Recent Labs    07/15/22 1040 07/16/22 0510  NA 141 140  K 4.0 3.8  CL 106 105  CO2 25 25  GLUCOSE 127* 76  BUN 22 18  CALCIUM 9.3 9.1  CREATININE 1.64* 1.60*  GFRNONAA 45* 46*    LIVER FUNCTION TESTS: Recent Labs    07/15/22 1040 07/16/22 0510  BILITOT 0.6 0.4  AST 20 13*  ALT 12 12  ALKPHOS 65 57  PROT 8.2* 7.2  ALBUMIN 4.2 3.6     Assessment and Plan: Posterior circulation infarcts with progressive neuro deficits including slurred speech, facial droop, and disequilibrium Bilateral PCA occlusion and VBI Pt received Plavix loading dose and ASA Will plan for dx angiogram today, will likely need intervention in the next couple days. Discussed angiogram with pt and wife. Risks and benefits of cerebral angio were discussed with the patient's wife including, but not limited to bleeding, infection, vascular injury or contrast induced renal failure.  This interventional procedure involves the use of X-rays and because of the nature of  the planned procedure, it is possible that we will have prolonged use of X-ray fluoroscopy.  Potential radiation risks to you include (but are not limited to) the following: - A slightly elevated risk for cancer several years later in life. This risk is typically less than 0.5% percent. This risk is low in comparison to the normal incidence of human cancer, which is 33% for women and 50% for men according to the [place]. - Radiation induced injury can include skin redness, resembling a rash, tissue breakdown / ulcers and hair loss (which can be temporary or permanent).   The likelihood of either of these occurring depends on the difficulty of the procedure and whether you are sensitive to radiation due to previous procedures, disease, or genetic conditions.   IF your procedure requires a prolonged use of radiation, you will be notified and given written instructions for further  action.  It is your responsibility to monitor the irradiated area for the 2 weeks following the procedure and to notify your physician if you are concerned that you have suffered a radiation induced injury.    All questions were answered, patient and wife are agreeable to proceed.  Consent signed and in chart.   Electronically Signed: Ascencion Dike, PA-C 07/16/2022, 9:55 AM   I spent a total of 30 minutes in face to face in clinical consultation, greater than 50% of which was counseling/coordinating care for cerebral angio

## 2022-07-16 NOTE — Progress Notes (Signed)
PT Cancellation Note  Patient Details Name: Mitchell Washington MRN: [id number] DOB: 04/27/1951   Cancelled Treatment:    Reason Eval/Treat Not Completed: Patient not medically ready  Noted pt with LVO and awaiting interventional radiology consult. Also noted return/increase in Left sided symptoms earlier this a.m. (as documented by RN). Will hold off on evaluation until after IR consult and possible intervention.    Jerolyn Center, PT Acute Rehabilitation Services  Office [phone]  Zena Amos 07/16/2022, 10:29 AM

## 2022-07-16 NOTE — Progress Notes (Signed)
   07/16/22 1415  Clinical Encounter Type  Visited With Family  Visit Type Initial;Other (Comment) (Advanced Directive)  Referral From Nurse  Consult/Referral To Chaplain   Chaplain responded to a spiritual consult for advanced directive education. The patient was receiving care and out of the room. A family member was present and I left the paperwork with her. I will continue to monitor.   Valerie Roys [facility]  [phone]

## 2022-07-16 NOTE — Progress Notes (Signed)
Echocardiogram 2D Echocardiogram has been performed.  Mitchell Washington 07/16/2022, 11:13 AM

## 2022-07-16 NOTE — ED Notes (Signed)
Pt attempted to use urinal to collect urine specimen - urinated on self.

## 2022-07-16 NOTE — Progress Notes (Signed)
  Transition of Care [facility]) Screening Note   Patient Details  Name: Mitchell Washington Date of Birth: Sep 08, 1950   [facility] CM/SW Contact:    Kermit Balo, RN Phone Number: 07/16/2022, 2:51 PM   Pt is from home with spouse. Awaiting therapy evals.  Transition of Care Department [facility]) has reviewed patient. We will continue to monitor patient advancement through interdisciplinary progression rounds. If new patient transition needs arise, please place a TOC consult.

## 2022-07-16 NOTE — Progress Notes (Signed)
Patient admitted earlier this morning.  Patient transferred from [facility] after found to have left-sided weakness and concern for large vessel occlusion on CT angiogram.  Patient seen and examined.  H&P reviewed.  Patient mentions that he has speech difficulties and weakness on the left side.  Denies any chest pain shortness of breath.  Vital signs reviewed. Lungs are clear to auscultation bilaterally. S1-S2 is normal regular. Abdomen is soft. Left-sided weakness noted.  Labs reviewed.  LDL is 58.  HbA1c is pending.  Echocardiogram is pending.  PT OT evaluation is pending.  Discussed with Dr. Corliss Skains.  Plan is for an angiogram.  Possibly today based on his discussions with patient's family. Neurology to continue to follow. Patient noted to be on aspirin and Plavix.  Also on statin. PT OT evaluation is pending.  Will also request SLP evaluation.  Rest of medical issues as per H&P.  Mitchell Washington 07/16/2022

## 2022-07-16 NOTE — H&P (Addendum)
History and Physical    Mitchell Washington [id number] DOB: [birth year] DOA: 07/16/2022  PCP: Derinda Late, MD  Patient coming from: [facility] ED  I have personally briefly reviewed patient's old medical records in [place]  Chief Complaint: stroke like symptoms  HPI: Mitchell Washington is a 71 y.o. male with medical history significant of  CVA, DmII, Gout, HLD , HTN ,CKD, dementia, who presents to Ed on 07/15/22 with complaint of intermittent unsteady gait leaning to left x 24 hours associated with generalized weakness.Patient now states he has some work finding difficulties as well as weakness on left side. He notes no fever/ chills/ n/v/d / sob or chest pain. He states his symptoms have not changes since arriving to [place].   ED Course:  In ed on arrival patient was noted to have per notes "left-sided facial droop with pronator drift on the left and dysmetria noted in left upper and lower extremities. " Code stroke activated as symptoms were recurrent after period of returning to baseline.  CTH neg, however CTA of the head and neck shows significant stenosis in the posterior circulation at that time due to patient symptoms improving TNK was not given. However it was recommended that patient be transferred to [facility] for nonemergent neuro IR intervention and at that time  Dr Dr. Estanislado Pandy was consulted.   Vitals: afeb, bp 163/65, hr 54, rr 16 sat 98%  EKG: junctional rhythm, q in inferior leads/ twave inversion lateral Twaves  more prominent on current ekg Labs: Wbc 6, K 13.6, pt 313 Inr 1  N a 141, K 4, glu 127, cr 1.64 at baseline Ce 6,8 Cxr Nad MRI brain: . No acute or reversible finding. 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 Mild chronic small-vessel ischemic changes elsewhere affecting the brain as outlined above. 2. Mild mucosal thickening and mucoid fluid in the left division of the sphenoid  sinus consistent with seasonal rhinitis.   CTA 1. Bilateral proximal PCA occlusion. 2. 24 mL of reported penumbra in the occipital lobes which largely corresponds to chronic infarcts. 3. 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 4. Mild cervical carotid artery atherosclerosis without significant stenosis. 5. Moderate left and mild right vertebral artery origin stenoses. 6.  Aortic Atherosclerosis (ICD10-I70.0). Tx asa/plavix  Of note on arrival , patient per RN has had fluctuation weakness of left side  Neuro Dr Rudy Jew notes no further imaging at this time as noted plan for vascular intervention in setting of LVO Review of Systems: As per HPI otherwise 10 point review of systems negative.   Past Medical History:  Diagnosis Date   CVA (cerebral infarction)    Diabetes mellitus without complication ([place])    Gout    Hyperlipidemia    Hypertension    Memory loss    Stroke [facility])    Wears dentures    partial upper    Past Surgical History:  Procedure Laterality Date   APPENDECTOMY     CATARACT EXTRACTION W/PHACO Left 10/25/2019   Procedure: CATARACT EXTRACTION PHACO AND INTRAOCULAR LENS PLACEMENT (IOC) LEFT DIABETIC INTRAVEITREAL KENALOG INJECTION 1.93  00:22.6;  Surgeon: Eulogio Bear, MD;  Location: [place];  Service: Ophthalmology;  Laterality: Left;  Diabetic - oral meds   CATARACT EXTRACTION W/PHACO Right 11/15/2019   Procedure: CATARACT EXTRACTION PHACO AND INTRAOCULAR LENS PLACEMENT ([place]) RIGHT DIABETIC;  Surgeon: Eulogio Bear,  MD;  Location: [place];  Service: Ophthalmology;  Laterality: Right;  0.92 0 ;19.7     reports that he has quit smoking. He has never used smokeless tobacco. He reports that he does not currently use alcohol. He reports that he does not use drugs.  No Known Allergies  Family History  Problem Relation Age of Onset    Diabetes Mother     Prior to Admission medications   Medication Sig Start Date End Date Taking? Authorizing Provider  amLODipine (NORVASC) 5 MG tablet Take 5 mg by mouth daily. 01/10/20   [provider]  amLODipine (NORVASC) 5 MG tablet Take 1 tablet by mouth daily. 06/11/21   [provider]  aspirin EC 81 MG tablet Take by mouth.    [provider]  atorvastatin (LIPITOR) 10 MG tablet TAKE 1 TABLET BY MOUTH EVERY DAY 07/06/18   [provider]  clonazePAM (KLONOPIN) 0.5 MG tablet Take 0.5 mg by mouth at bedtime as needed for anxiety. Patient not taking: Reported on 07/15/2022 03/17/22   [provider]  [facility] VITAMIN B12 1000 MCG tablet Take 1,000 mcg by mouth daily. 09/21/18   [provider]  cyanocobalamin 1000 MCG tablet Take 1 tablet by mouth daily. 08/22/20   [provider]  Cysteamine Bitartrate (PROCYSBI) 300 MG PACK Use 1 each once daily Use as instructed. Patient not taking: Reported on 07/15/2022 12/15/18   [provider]  donepezil (ARICEPT) 10 MG tablet  08/27/18   [provider]  famotidine (PEPCID) 20 MG tablet Take 1 tablet (20 mg total) by mouth 2 (two) times daily. Patient not taking: Reported on 07/15/2022 01/21/21   Carrie Mew, MD  glipiZIDE (GLUCOTROL XL) 2.5 MG 24 hr tablet Take by mouth. 01/03/20 07/15/22  [provider]  lisinopril (PRINIVIL,ZESTRIL) 20 MG tablet TAKE 1 TABLET BY MOUTH EVERY DAY Patient not taking: Reported on 07/15/2022 07/06/18   [provider]  lisinopril (ZESTRIL) 40 MG tablet Take 40 mg by mouth daily. 09/23/21   [provider]  memantine (NAMENDA) 10 MG tablet Take 10 mg by mouth 2 (two) times daily. 12/20/19   [provider]  metFORMIN (GLUCOPHAGE-XR) 500 MG 24 hr tablet Take 500 mg by mouth 2 (two) times daily with a meal. 01/15/18 07/15/22  [provider]  ondansetron (ZOFRAN ODT) 4 MG disintegrating tablet Take 1 tablet  (4 mg total) by mouth every 8 (eight) hours as needed for nausea or vomiting. 01/21/21   Carrie Mew, MD    Physical Exam: Vitals:   07/16/22 0123 07/16/22 0143 07/16/22 0204  BP: (!) 186/69  (!) 158/81  Pulse: (!) 51    Resp: 15    Temp:   (!) 97.5 F (36.4 C)  TempSrc:   Oral  SpO2: 99%    Weight:  81.3 kg   Height:  5\' 9"  (1.753 m)     Constitutional: NAD, calm, comfortable Vitals:   07/16/22 0123 07/16/22 0143 07/16/22 0204  BP: (!) 186/69  (!) 158/81  Pulse: (!) 51    Resp: 15    Temp:   (!) 97.5 F (36.4 C)  TempSrc:   Oral  SpO2: 99%    Weight:  81.3 kg   Height:  5\' 9"  (1.753 m)    Eyes: PERRL, lids and conjunctivae normal ENMT: Mucous membranes are moist. Posterior pharynx clear of any exudate or lesions.Normal dentition.  Neck: normal, supple, no masses, no thyromegaly Respiratory: clear to auscultation bilaterally, no  wheezing, no crackles. Normal respiratory effort. No accessory muscle use.  Cardiovascular: Regular rate and rhythm, no murmurs / rubs / gallops. No extremity edema. 2+ pedal pulses. No carotid bruits.  Abdomen: no tenderness, no masses palpated. No hepatosplenomegaly. Bowel sounds positive.  Musculoskeletal: no clubbing / cyanosis. No joint deformity upper and lower extremities. Good ROM, no contractures. Normal muscle tone.  Skin: no rashes, lesions, ulcers. No induration Neurologic: CN 2-12 grossly intact except noted left side facial drop, deviation of tongue to left, 3+/4 LUE , weak grip , 4+/5 lower extremities 5/5 RUE, Sensation intact, Psychiatric: Normal judgment and insight. Alert and oriented x 2. Normal mood.    Labs on Admission: I have personally reviewed following labs and imaging studies  CBC: Recent Labs  Lab 07/15/22 1040  WBC 6.0  NEUTROABS 3.4  HGB 13.6  HCT 42.4  MCV 82.7  PLT [id number]   Basic Metabolic Panel: Recent Labs  Lab 07/15/22 1040  NA 141  K 4.0  CL 106  CO2 25  GLUCOSE 127*  BUN 22  CREATININE  1.64*  CALCIUM 9.3   GFR: Estimated Creatinine Clearance: 41.9 mL/min (A) (by C-G formula based on SCr of 1.64 mg/dL (H)). Liver Function Tests: Recent Labs  Lab 07/15/22 1040  AST 20  ALT 12  ALKPHOS 65  BILITOT 0.6  PROT 8.2*  ALBUMIN 4.2   No results for input(s): "LIPASE", "AMYLASE" in the last 168 hours. No results for input(s): "AMMONIA" in the last 168 hours. Coagulation Profile: Recent Labs  Lab 07/15/22 1040  INR 1.0   Cardiac Enzymes: No results for input(s): "CKTOTAL", "CKMB", "CKMBINDEX", "TROPONINI" in the last 168 hours. BNP (last 3 results) No results for input(s): "PROBNP" in the last 8760 hours. HbA1C: No results for input(s): "HGBA1C" in the last 72 hours. CBG: Recent Labs  Lab 07/15/22 1019 07/15/22 2053 07/16/22 0244  GLUCAP 122* 152* 82   Lipid Profile: No results for input(s): "CHOL", "HDL", "LDLCALC", "TRIG", "CHOLHDL", "LDLDIRECT" in the last 72 hours. Thyroid Function Tests: No results for input(s): "TSH", "T4TOTAL", "FREET4", "T3FREE", "THYROIDAB" in the last 72 hours. Anemia Panel: No results for input(s): "VITAMINB12", "FOLATE", "FERRITIN", "TIBC", "IRON", "RETICCTPCT" in the last 72 hours. Urine analysis:    Component Value Date/Time   COLORURINE YELLOW (A) 01/21/2021 1918   APPEARANCEUR HAZY (A) 01/21/2021 1918   LABSPEC 1.022 01/21/2021 1918   PHURINE 5.0 01/21/2021 1918   GLUCOSEU >=500 (A) 01/21/2021 1918   HGBUR SMALL (A) 01/21/2021 1918   BILIRUBINUR NEGATIVE 01/21/2021 1918   KETONESUR NEGATIVE 01/21/2021 1918   PROTEINUR 100 (A) 01/21/2021 1918   NITRITE NEGATIVE 01/21/2021 1918   LEUKOCYTESUR NEGATIVE 01/21/2021 1918    Radiological Exams on Admission: MR BRAIN WO CONTRAST  Result Date: 07/15/2022 CLINICAL DATA:  Neuro deficit, acute, stroke suspected. Gait disturbance. Headache. Dementia. EXAM: MRI HEAD WITHOUT CONTRAST TECHNIQUE: Multiplanar, multiecho pulse sequences of the brain and surrounding structures were  obtained without intravenous contrast. COMPARISON:  CT studies earlier same day FINDINGS: Brain: Diffusion imaging does not show any acute or subacute infarction. No focal abnormality affects the brainstem. There are numerous old small vessel cerebellar infarctions. Cerebral hemispheres show old infarctions in the PCA territories more extensive on the left than the right with encephalomalacia and gliosis. There is old infarction in the left basal ganglia and external capsule region. Mild chronic small-vessel ischemic changes seen elsewhere within the hemispheric white matter. Hemosiderin deposition is present in association with the old left basal ganglia/external capsule  infarction. No mass lesion, recent hemorrhage, hydrocephalus or extra-axial collection. Vascular: Major vessels at the base of the brain show flow. Skull and upper cervical spine: Negative Sinuses/Orbits: Small amount of mucoid fluid in the left division of the sphenoid sinus. Mild mucosal thickening elsewhere consistent with seasonal rhinitis. Other: None IMPRESSION: 1. No acute or reversible finding. 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 Mild chronic small-vessel ischemic changes elsewhere affecting the brain as outlined above. 2. Mild mucosal thickening and mucoid fluid in the left division of the sphenoid sinus consistent with seasonal rhinitis. Electronically Signed   By: Nelson Chimes M.D.   On: 07/15/2022 12:59   CT ANGIO HEAD NECK W WO CM W PERF (CODE STROKE)  Result Date: 07/15/2022 CLINICAL DATA:  Neuro deficit, acute, stroke suspected. Ataxia, slurred speech, left-sided weakness, and facial droop. EXAM: CT ANGIOGRAPHY HEAD AND NECK CT PERFUSION BRAIN TECHNIQUE: Multidetector CT imaging of the head and neck was performed using the standard protocol during bolus administration of intravenous contrast. Multiplanar CT image reconstructions and MIPs  were obtained to evaluate the vascular anatomy. Carotid stenosis measurements (when applicable) are obtained utilizing NASCET criteria, using the distal internal carotid diameter as the denominator. Multiphase CT imaging of the brain was performed following IV bolus contrast injection. Subsequent parametric perfusion maps were calculated using RAPID software. RADIATION DOSE REDUCTION: This exam was performed according to the departmental dose-optimization program which includes automated exposure control, adjustment of the mA and/or kV according to patient size and/or use of iterative reconstruction technique. CONTRAST:  131mL OMNIPAQUE IOHEXOL 350 MG/ML SOLN COMPARISON:  None Available. FINDINGS: CTA NECK FINDINGS Aortic arch: Normal variant aortic arch branching pattern with common origin of the brachiocephalic and left common carotid arteries. Mild atherosclerosis without a significant stenosis of the arch vessel origins. Right carotid system: Patent with a small amount of calcified and soft plaque at the carotid bifurcation. No evidence of a significant stenosis or dissection. Left carotid system: Patent with a small amount of calcified and soft plaque in the distal common carotid artery. No evidence of a significant stenosis or dissection. Vertebral arteries: Patent with the left being dominant. Plaque at the vertebral origins results in mild stenosis on the right and moderate stenosis on the left. Mild multifocal irregular narrowing of the right V2 segment. Skeleton: Mild cervical spondylosis. Other neck: No evidence of cervical lymphadenopathy or mass. Upper chest: Clear lung apices. Review of the MIP images confirms the above findings CTA HEAD FINDINGS Anterior circulation: The internal carotid arteries are patent from skull base to carotid termini with moderate to severe paraclinoid stenoses bilaterally. ACAs and MCAs are patent with moderate branch vessel atherosclerotic irregularity as well as severe  irregular narrowing of the left A1 segment. No aneurysm is identified. Posterior circulation: The intracranial vertebral arteries are patent to the basilar with severe stenoses bilaterally. The basilar artery is patent with a severe stenosis of its proximal to midportion (immediately distal to the right AICA origin) and a moderate stenosis distally. Both PCAs are occluded at the P1 levels. There may be a small right posterior communicating artery, and there is some reconstitution of distal PCA branches primarily on the right. No aneurysm is identified. Venous sinuses: Patent. Anatomic variants: None. Review of the MIP images confirms the above findings CT Brain Perfusion Findings: ASPECTS: 10 CBF (<30%) Volume: 0 mL Perfusion (Tmax>6.0s) volume: 24 mL, located in both occipital lobes (PCA territories) Mismatch Volume: 24 mL Infarction  Location: No acute core infarct identified by CTP. The study was reviewed by telephone with Dr. Wilford Corner on 07/15/2022 at 11:10 a.m. IMPRESSION: 1. Bilateral proximal PCA occlusion. 2. 24 mL of reported penumbra in the occipital lobes which largely corresponds to chronic infarcts. 3. 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 4. Mild cervical carotid artery atherosclerosis without significant stenosis. 5. Moderate left and mild right vertebral artery origin stenoses. 6.  Aortic Atherosclerosis (ICD10-I70.0). Electronically Signed   By: Sebastian Ache M.D.   On: 07/15/2022 11:48   CT HEAD CODE STROKE WO CONTRAST  Result Date: 07/15/2022 CLINICAL DATA:  Code stroke. Neuro deficit, acute, stroke suspected. Ataxia, slurred speech, left-sided weakness, and facial droop. EXAM: CT HEAD WITHOUT CONTRAST TECHNIQUE: Contiguous axial images were obtained from the base of the skull through the vertex without intravenous contrast. RADIATION DOSE REDUCTION: This exam was performed according to the  departmental dose-optimization program which includes automated exposure control, adjustment of the mA and/or kV according to patient size and/or use of iterative reconstruction technique. COMPARISON:  Head CT 01/21/2021 FINDINGS: Brain: There is no evidence of an acute infarct, intracranial hemorrhage, mass, midline shift, or extra-axial fluid collection. A chronic left basal ganglia infarct, small right and large left chronic PCA infarcts, and small chronic bilateral cerebellar infarcts were present in 2022. There is ex vacuo dilatation of the left lateral ventricle. Hypodensities elsewhere in the cerebral white matter are unchanged and nonspecific but compatible with mild chronic small vessel ischemic disease. Vascular: Calcified atherosclerosis at the skull base. No hyperdense vessel. Skull: No acute fracture or suspicious osseous lesion. Sinuses/Orbits: Mild mucosal thickening in the paranasal sinuses. Clear mastoid air cells. Bilateral cataract extraction. Other: Mild left parietal scalp scarring. ASPECTS ([place]berta Stroke Program Early CT Score) (right MCA territory) - Ganglionic level infarction (caudate, lentiform nuclei, internal capsule, insula, M1-M3 cortex): 7 - Supraganglionic infarction (M4-M6 cortex): 3 Total score (0-10 with 10 being normal): 10 The study was reviewed by telephone with Dr. Wilford Corner on 07/15/2022 at 11:10 a.m. IMPRESSION: 1. No evidence of acute intracranial abnormality. ASPECTS of 10. 2. 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 Electronically Signed   By: Sebastian Ache M.D.   On: 07/15/2022 11:22   DG Chest Portable 1 View  Result Date: 07/15/2022 CLINICAL DATA:  Weakness EXAM: PORTABLE CHEST 1 VIEW COMPARISON:  Chest radiograph 02/20/2021 FINDINGS: The cardiomediastinal silhouette is normal. There is no focal consolidation or pulmonary edema. There is no pleural effusion or pneumothorax There is no acute osseous abnormality. IMPRESSION:  No radiographic evidence of acute cardiopulmonary process. Electronically Signed   By: Lesia Hausen M.D.   On: 07/15/2022 11:19    EKG: Independently reviewed. See above  Assessment/Plan  TIA w/o CVA -insetting of prior CVA w/o residual defictis -CTA with findings of PCA occlusion /vertebral a occulusion mild to mod - CT head negative ,-MRI negative  -asa/plavix  -neuro checks , SLP, PT/OT  -echo pending  - npo for vascular intervention -neuro checks per protocol     DmII -iss/fs    Gout -no acute flare    HLD  -continue statin    HTN -permissive HTN for now   CKD -at baseline    Dementia -resume home regimen    DVT prophylaxis: scd Code Status: full  Family Communication:  Disposition Plan: patient  expected to be admitted greater than 2 midnights  Consults called: Dr. Corliss Skains , DR  Rudy Jew Admission status:progressive   Clance Boll MD [facility]  If 7PM-7AM, please contact night-coverage [email or web address] Password [facility]  07/16/2022, 2:54 AM

## 2022-07-16 NOTE — Progress Notes (Signed)
OT Cancellation    07/16/22 1031  OT Visit Information  Last OT Received On 07/16/22  Reason Eval/Treat Not Completed Medical issues which prohibited therapy (Incresae in symptoms per nsg. awaiting IR consult. Will follow up tomorrow as appropriate.)   Luisa Dago, OT/L   Acute OT Clinical Specialist Acute Rehabilitation Services Pager [phone] Office [phone]

## 2022-07-17 DIAGNOSIS — G459 Transient cerebral ischemic attack, unspecified: Secondary | ICD-10-CM | POA: Diagnosis not present

## 2022-07-17 DIAGNOSIS — I639 Cerebral infarction, unspecified: Secondary | ICD-10-CM | POA: Diagnosis not present

## 2022-07-17 DIAGNOSIS — I651 Occlusion and stenosis of basilar artery: Secondary | ICD-10-CM | POA: Diagnosis not present

## 2022-07-17 LAB — CBC
HCT: 40.4 % (ref 39.0–52.0)
Hemoglobin: 13.2 g/dL (ref 13.0–17.0)
MCH: 27.2 pg (ref 26.0–34.0)
MCHC: 32.7 g/dL (ref 30.0–36.0)
MCV: 83.1 fL (ref 80.0–100.0)
Platelets: 308 10*3/uL (ref 150–400)
RBC: 4.86 MIL/uL (ref 4.22–5.81)
RDW: 13.1 % (ref 11.5–15.5)
WBC: 7.1 10*3/uL (ref 4.0–10.5)
nRBC: 0 % (ref 0.0–0.2)

## 2022-07-17 LAB — GLUCOSE, CAPILLARY
Glucose-Capillary: 100 mg/dL — ABNORMAL HIGH (ref 70–99)
Glucose-Capillary: 96 mg/dL (ref 70–99)
Glucose-Capillary: 159 mg/dL — ABNORMAL HIGH (ref 70–99)
Glucose-Capillary: 130 mg/dL — ABNORMAL HIGH (ref 70–99)
Glucose-Capillary: 169 mg/dL — ABNORMAL HIGH (ref 70–99)

## 2022-07-17 LAB — BASIC METABOLIC PANEL
Anion gap: 10 (ref 5–15)
BUN: 12 mg/dL (ref 8–23)
CO2: 24 mmol/L (ref 22–32)
Calcium: 8.6 mg/dL — ABNORMAL LOW (ref 8.9–10.3)
Chloride: 105 mmol/L (ref 98–111)
Creatinine, Ser: 1.53 mg/dL — ABNORMAL HIGH (ref 0.61–1.24)
GFR, Estimated: 49 mL/min — ABNORMAL LOW (ref >60)
Glucose, Bld: 94 mg/dL (ref 70–99)
Potassium: 3.7 mmol/L (ref 3.5–5.1)
Sodium: 139 mmol/L (ref 135–145)

## 2022-07-17 LAB — HEPARIN LEVEL (UNFRACTIONATED)
Heparin Unfractionated: 0.98 IU/mL — ABNORMAL HIGH (ref 0.30–0.70)
Heparin Unfractionated: 0.57 IU/mL (ref 0.30–0.70)

## 2022-07-17 LAB — TYPE AND SCREEN
ABO/RH(D): O POS
Antibody Screen: NEGATIVE

## 2022-07-17 LAB — ABO/RH: ABO/RH(D): O POS

## 2022-07-17 MED ORDER — INSULIN ASPART 100 UNIT/ML IJ SOLN
0.0000 [IU] | Freq: Three times a day (TID) | INTRAMUSCULAR | Status: DC
  Administered 2022-07-17 (×2): 2 [IU] via SUBCUTANEOUS
  Administered 2022-07-17: 1 [IU] via SUBCUTANEOUS
  Administered 2022-07-18: 3 [IU] via SUBCUTANEOUS
  Administered 2022-07-19: 1 [IU] via SUBCUTANEOUS

## 2022-07-17 MED ORDER — HEPARIN (PORCINE) 25000 UT/250ML-% IV SOLN
1000.0000 [IU]/h | INTRAVENOUS | Status: DC
  Administered 2022-07-17: 1000 [IU]/h via INTRAVENOUS

## 2022-07-17 MED ORDER — ASPIRIN 81 MG PO TBEC
81.0000 mg | DELAYED_RELEASE_TABLET | Freq: Every day | ORAL | Status: DC
  Administered 2022-07-18: 81 mg via ORAL
  Filled 2022-07-17: qty 1

## 2022-07-17 MED ORDER — HEPARIN (PORCINE) 25000 UT/250ML-% IV SOLN
850.0000 [IU]/h | INTRAVENOUS | Status: DC
  Administered 2022-07-17: 900 [IU]/h via INTRAVENOUS
  Filled 2022-07-17: qty 250

## 2022-07-17 NOTE — Progress Notes (Signed)
PT Cancellation Note  Patient Details Name: LYCAN DAVEE MRN: [id number] DOB: 1950-12-28   Cancelled Treatment:    Reason Eval/Treat Not Completed: Active bedrest order  Noted pt now on bedrest. Plans for IR procedure 11/30. Will follow-up after IR procedure.    Jerolyn Center, PT Acute Rehabilitation Services  Office [phone]  Zena Amos 07/17/2022, 7:32 AM

## 2022-07-17 NOTE — Progress Notes (Signed)
ANTICOAGULATION CONSULT NOTE - Initial Consult  Pharmacy Consult for heparin Indication: stroke  No Known Allergies  Patient Measurements: Height: 5\' 9"  (175.3 cm) (per pt) Weight: 81.3 kg (179 lb 3.7 oz) IBW/kg (Calculated) : 70.7 Heparin Dosing Weight: 81kg  Vital Signs: Temp: 99.4 F (37.4 C) (11/29 1627) Temp Source: Oral (11/29 1627) BP: 161/70 (11/29 1627) Pulse Rate: 55 (11/29 1627)  Labs: Recent Labs    07/15/22 1040 07/15/22 1442 07/16/22 0510 07/17/22 0656 07/17/22 1822  HGB 13.6  --  13.2 13.2  --   HCT 42.4  --  40.6 40.4  --   PLT 313  --  313 308  --   APTT 28  --   --   --   --   LABPROT 13.4  --   --   --   --   INR 1.0  --   --   --   --   HEPARINUNFRC  --   --   --  0.98* 0.57  CREATININE 1.64*  --  1.60* 1.53*  --   TROPONINIHS 6 8  --   --   --      Estimated Creatinine Clearance: 44.9 mL/min (A) (by C-G formula based on SCr of 1.53 mg/dL (H)).   Medical History: Past Medical History:  Diagnosis Date   CVA (cerebral infarction)    Diabetes mellitus without complication ([facility])    Gout    Hyperlipidemia    Hypertension    Memory loss    Stroke [facility])    Wears dentures    partial upper    Medications:  Medications Prior to Admission  Medication Sig Dispense Refill Last Dose   amLODipine (NORVASC) 5 MG tablet Take 5 mg by mouth daily.   07/15/2022   aspirin EC 81 MG tablet Take 81 mg by mouth daily.   07/15/2022   atorvastatin (LIPITOR) 10 MG tablet Take 10 mg by mouth daily.   07/15/2022   clonazePAM (KLONOPIN) 0.5 MG tablet Take 0.5 mg by mouth at bedtime as needed for anxiety.   Past Week   [facility]S VITAMIN B12 1000 MCG tablet Take 1,000 mcg by mouth daily.   07/15/2022   cyanocobalamin 1000 MCG tablet Take 1 tablet by mouth daily.   07/15/2022   donepezil (ARICEPT) 10 MG tablet Take 10 mg by mouth at bedtime.   Past Week   glipiZIDE (GLUCOTROL XL) 5 MG 24 hr tablet Take 5 mg by mouth daily with breakfast.   07/15/2022   lisinopril  (ZESTRIL) 40 MG tablet Take 40 mg by mouth daily.   07/15/2022   memantine (NAMENDA) 10 MG tablet Take 10 mg by mouth 2 (two) times daily.   07/15/2022   metFORMIN (GLUCOPHAGE-XR) 500 MG 24 hr tablet Take 500 mg by mouth 2 (two) times daily with a meal.   07/15/2022   famotidine (PEPCID) 20 MG tablet Take 1 tablet (20 mg total) by mouth 2 (two) times daily. (Patient not taking: Reported on 07/15/2022) 60 tablet 0 Not Taking   ondansetron (ZOFRAN ODT) 4 MG disintegrating tablet Take 1 tablet (4 mg total) by mouth every 8 (eight) hours as needed for nausea or vomiting. (Patient not taking: Reported on 07/16/2022) 20 tablet 0 Not Taking   Scheduled:   aspirin EC  325 mg Oral Daily   atorvastatin  80 mg Oral Daily   clopidogrel  75 mg Oral Daily   insulin aspart  0-9 Units Subcutaneous TID AC & HS  Infusions:   sodium chloride 75 mL/hr at 07/17/22 1146   heparin      Assessment: Pt presented to [facility] with CVA. Tx here for cerebral arteriogram with plan to do angioplasty/stenting this Thursday. Heparin for anticoagulation.   Goal of Therapy:  Heparin level 0.3-0.5 units/ml Monitor platelets by anticoagulation protocol: Yes  Heparin level came back slightly supratherapeutic. We will decrease rate and check level in AM. Plan to hold at 10AM for angioplasty/stenting.  Plan:   Decrease heparin drip to 900units/hr - stop at 10 AM Follow-up heparin level in AM Monitor daily heparin level  Ulyses Southward, PharmD, [place], AAHIVP, CPP Infectious Disease Pharmacist 07/17/2022 7:18 PM

## 2022-07-17 NOTE — Progress Notes (Addendum)
ANTICOAGULATION CONSULT NOTE - Initial Consult  Pharmacy Consult for heparin Indication: stroke  No Known Allergies  Patient Measurements: Height: 5\' 9"  (175.3 cm) (per pt) Weight: 81.3 kg (179 lb 3.7 oz) IBW/kg (Calculated) : 70.7 Heparin Dosing Weight: 81kg  Vital Signs: Temp: 98.6 F (37 C) (11/29 0755) Temp Source: Oral (11/29 0755) BP: 168/77 (11/29 0755) Pulse Rate: 56 (11/29 0755)  Labs: Recent Labs    07/15/22 1040 07/15/22 1442 07/16/22 0510 07/17/22 0656  HGB 13.6  --  13.2 13.2  HCT 42.4  --  40.6 40.4  PLT 313  --  313 308  APTT 28  --   --   --   LABPROT 13.4  --   --   --   INR 1.0  --   --   --   HEPARINUNFRC  --   --   --  0.98*  CREATININE 1.64*  --  1.60* 1.53*  TROPONINIHS 6 8  --   --      Estimated Creatinine Clearance: 44.9 mL/min (A) (by C-G formula based on SCr of 1.53 mg/dL (H)).   Medical History: Past Medical History:  Diagnosis Date   CVA (cerebral infarction)    Diabetes mellitus without complication (HCC)    Gout    Hyperlipidemia    Hypertension    Memory loss    Stroke [facility])    Wears dentures    partial upper    Medications:  Medications Prior to Admission  Medication Sig Dispense Refill Last Dose   amLODipine (NORVASC) 5 MG tablet Take 5 mg by mouth daily.   07/15/2022   aspirin EC 81 MG tablet Take 81 mg by mouth daily.   07/15/2022   atorvastatin (LIPITOR) 10 MG tablet Take 10 mg by mouth daily.   07/15/2022   clonazePAM (KLONOPIN) 0.5 MG tablet Take 0.5 mg by mouth at bedtime as needed for anxiety.   Past Week   [facility] VITAMIN B12 1000 MCG tablet Take 1,000 mcg by mouth daily.   07/15/2022   cyanocobalamin 1000 MCG tablet Take 1 tablet by mouth daily.   07/15/2022   donepezil (ARICEPT) 10 MG tablet Take 10 mg by mouth at bedtime.   Past Week   glipiZIDE (GLUCOTROL XL) 5 MG 24 hr tablet Take 5 mg by mouth daily with breakfast.   07/15/2022   lisinopril (ZESTRIL) 40 MG tablet Take 40 mg by mouth daily.   07/15/2022    memantine (NAMENDA) 10 MG tablet Take 10 mg by mouth 2 (two) times daily.   07/15/2022   metFORMIN (GLUCOPHAGE-XR) 500 MG 24 hr tablet Take 500 mg by mouth 2 (two) times daily with a meal.   07/15/2022   famotidine (PEPCID) 20 MG tablet Take 1 tablet (20 mg total) by mouth 2 (two) times daily. (Patient not taking: Reported on 07/15/2022) 60 tablet 0 Not Taking   ondansetron (ZOFRAN ODT) 4 MG disintegrating tablet Take 1 tablet (4 mg total) by mouth every 8 (eight) hours as needed for nausea or vomiting. (Patient not taking: Reported on 07/16/2022) 20 tablet 0 Not Taking   Scheduled:    stroke: early stages of recovery book   Does not apply Once   aspirin EC  325 mg Oral Daily   atorvastatin  80 mg Oral Daily   clopidogrel  75 mg Oral Daily   insulin aspart  0-9 Units Subcutaneous Q4H   Infusions:   sodium chloride Stopped (07/16/22 1949)   heparin 1,200 Units/hr (07/17/22 0413)  Assessment: Pt presented to [facility] with CVA. Tx here for cerebral arteriogram with plan to do angioplasty/stenting this Thursday. Heparin for anticoagulation.   Goal of Therapy:  Heparin level 0.3-0.5 units/ml Monitor platelets by anticoagulation protocol: Yes  Heparin level supratherapeutic at 0.98 on heparin 1200 units/hr. Confirmed heparin level drawn from opposite arm of infusion. No bleeding per RN.   Plan:  Hold heparin infusion for 30 min  Resume heparin drip at 1000 units/hr Follow-up heparin level in 8 hours Monitor daily heparin level  Rexford Maus, PharmD, BCPS 07/17/2022 9:04 AM  PM UPDATE: - Discussed with IR - plan for angioplasty/stenting 11/30 @ 1100 - heparin drip should be held 11/30 @ 1000  Rexford Maus, PharmD, BCPS 07/17/2022 1:55 PM

## 2022-07-17 NOTE — Progress Notes (Signed)
PROGRESS NOTE    Mitchell Washington  ZOX:[id number] DOB: 1950/11/28 DOA: 07/16/2022 PCP: Kandyce Rud, MD   Brief Narrative:  Mitchell Washington is a 71 y.o. male with medical history significant of  CVA, DmII, Gout, HLD , HTN ,CKD2, dementia, who presents to [facility] on 07/15/22 with complaint of intermittent unsteady gait and word finding difficulties.  Initial imaging concerning for occlusion, subsequently transferred to [facility] for interventional radiology/vascular surgery evaluation.    Assessment & Plan:   Principal Problem:   TIA (transient ischemic attack) Active Problems:   B12 deficiency   Benign essential hypertension   Cerebrovascular disease   Pure hypercholesterolemia   Type 2 diabetes mellitus with peripheral neuropathy (HCC)  TIA w/o CVA -History of previous CVAs w/o residual defictis -CTA with findings of PCA occlusion /vertebral a occulusion mild to mod - CT head negative ,-MRI brain negative  -asa/plavix ongoing per neurology -Vascular intervention pending radiology schedule   None insulin-dependent diabetes type 2, well-controlled -iss/fs  Lab Results  Component Value Date   HGBA1C 6.5 (H) 07/16/2022   Gout -no acute flare    HLD  -continue statin    HTN -permissive HTN in the interim   CKD2 -at baseline     Dementia, unspecified -resume home regimen    DVT prophylaxis: Heparin drip Code Status: Full Family Communication: At bedside  Status is: Inpatient  Dispo: The patient is from: Home              Anticipated d/c is to: Home              Anticipated d/c date is: 48 hours              Patient currently not medically stable for discharge given need for intervention as above  Consultants:  Interventional radiology  Procedures:  As above  Antimicrobials:  None  Subjective: No acute issues or events overnight  Objective: Vitals:   07/16/22 1933 07/16/22 2318 07/17/22 0322 07/17/22 0755  BP: (!) 169/57 (!) 177/67 (!)  165/72 (!) 168/77  Pulse: (!) 51 (!) 51 (!) 55 (!) 56  Resp: Temp: 98.2 F (36.8 C) 98.7 F (37.1 C) 98.2 F (36.8 C) 98.6 F (37 C)  TempSrc: Oral Oral Oral Oral  SpO2: 94% 97% 98% 98%  Weight:      Height:        Intake/Output Summary (Last 24 hours) at 07/17/2022 1152 Last data filed at 07/17/2022 0413 Gross per 24 hour  Intake 1100.64 ml  Output 150 ml  Net 950.64 ml   Filed Weights   07/16/22 0143  Weight: 81.3 kg    Examination:  General exam: Appears calm and comfortable  Respiratory system: Clear to auscultation. Respiratory effort normal. Cardiovascular system: S1 & S2 heard, RRR. No JVD, murmurs, rubs, gallops or clicks. No pedal edema. Gastrointestinal system: Abdomen is nondistended, soft and nontender. No organomegaly or masses felt. Normal bowel sounds heard. Central nervous system: Alert and oriented. No focal neurological deficits. Extremities: Symmetric 5 x 5 power. Skin: No rashes, lesions or ulcers Psychiatry: Judgement and insight appear normal. Mood & affect appropriate.     Data Reviewed: I have personally reviewed following labs and imaging studies  CBC: Recent Labs  Lab 07/15/22 1040 07/16/22 0510 07/17/22 0656  WBC 6.0 6.1 7.1  NEUTROABS 3.4  --   --   HGB 13.6 13.2 13.2  HCT 42.4 40.6 40.4  MCV 82.7 83.4 83.1  PLT 313 313 308   Basic Metabolic Panel: Recent Labs  Lab 07/15/22 1040 07/16/22 0510 07/17/22 0656  NA 141 140 139  K 4.0 3.8 3.7  CL 106 105 105  CO2 25 25 24   GLUCOSE 127* 76 94  BUN 22 18 12   CREATININE 1.64* 1.60* 1.53*  CALCIUM 9.3 9.1 8.6*   GFR: Estimated Creatinine Clearance: 44.9 mL/min (A) (by C-G formula based on SCr of 1.53 mg/dL (H)). Liver Function Tests: Recent Labs  Lab 07/15/22 1040 07/16/22 0510  AST 20 13*  ALT 12 12  ALKPHOS 65 57  BILITOT 0.6 0.4  PROT 8.2* 7.2  ALBUMIN 4.2 3.6   No results for input(s): "LIPASE", "AMYLASE" in the last 168 hours. No results for  input(s): "AMMONIA" in the last 168 hours. Coagulation Profile: Recent Labs  Lab 07/15/22 1040  INR 1.0   Cardiac Enzymes: No results for input(s): "CKTOTAL", "CKMB", "CKMBINDEX", "TROPONINI" in the last 168 hours. BNP (last 3 results) No results for input(s): "PROBNP" in the last 8760 hours. HbA1C: Recent Labs    07/16/22 0510  HGBA1C 6.5*   CBG: Recent Labs  Lab 07/16/22 1556 07/16/22 1936 07/16/22 2320 07/17/22 0323 07/17/22 0755  GLUCAP 84 148* 95 100* 96   Lipid Profile: Recent Labs    07/16/22 0510  CHOL 105  HDL 31*  LDLCALC 58  TRIG 80  CHOLHDL 3.4   Thyroid Function Tests: No results for input(s): "TSH", "T4TOTAL", "FREET4", "T3FREE", "THYROIDAB" in the last 72 hours. Anemia Panel: No results for input(s): "VITAMINB12", "FOLATE", "FERRITIN", "TIBC", "IRON", "RETICCTPCT" in the last 72 hours. Sepsis Labs: No results for input(s): "PROCALCITON", "LATICACIDVEN" in the last 168 hours.  No results found for this or any previous visit (from the past 240 hour(s)).   Radiology Studies: IR ANGIO INTRA EXTRACRAN SEL COM CAROTID INNOMINATE BILAT MOD SED  Result Date: 07/17/2022 CLINICAL DATA:  Severe vertebrobasilar ischemic symptoms of dizziness, gait imbalance, and lethargy. 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 EXAM: BILATERAL COMMON CAROTID AND INNOMINATE ANGIOGRAPHY COMPARISON:  CT angiogram of the head and neck July 15, 2022. MEDICATIONS: Heparin 1000 units IV. No antibiotic was administered within 1 hour of the procedure. ANESTHESIA/SEDATION: Versed 1 mg IV; Fentanyl 25 mcg IV Moderate Sedation Time:  27 minutes The patient was continuously monitored during the procedure by the interventional radiology nurse under my direct supervision. CONTRAST:  Omnipaque 300 approximately 65 mL. FLUOROSCOPY TIME:  Fluoroscopy Time: 8 minutes 0 seconds (1080 mGy). COMPLICATIONS: None immediate. TECHNIQUE: Informed  written consent was obtained from the patient after a thorough discussion of the procedural risks, benefits and alternatives. All questions were addressed. Maximal Sterile Barrier Technique was utilized including caps, mask, sterile gowns, sterile gloves, sterile drape, hand hygiene and skin antiseptic. A timeout was performed prior to the initiation of the procedure. The right groin was prepped and draped in the usual sterile fashion. Thereafter using modified Seldinger technique, transfemoral access into the right common femoral artery was obtained without difficulty. Over an 0.035 inch guidewire, a 5 French Pinnacle sheath was inserted. Through this, and also over an 0.035 inch guidewire, a 5 07/19/2022 JB 1 catheter was advanced to the aortic arch region and selectively positioned in the right common carotid artery, the right vertebral artery, the left common carotid artery and the left vertebral artery. FINDINGS: The innominate arteriogram demonstrates the right subclavian artery proximally in the right common carotid artery to be patent  proximally. The non dominant right vertebral artery has a 30% stenosis at its origin. More distally, the vessel is seen to ascend to the cranial skull base. Patency is seen of the right vertebrobasilar junction with a mild-to-moderate tapered stenosis of the distal right vertebrobasilar junction. Severe decrease in caliber is seen of the proximal basilar artery, and in the mid basilar artery. Contrast is noted in the distal basilar artery, the superior cerebellar artery and transiently the anterior cerebral arteries proximally. The right common carotid arteriogram demonstrates the right external carotid artery and its major branches to be widely patent. The right internal carotid artery stenosis proximally with a small ulcerated plaque along the medial wall of the bulb. More distally, the right internal carotid artery opacifies normally to the cranial skull base. The petrous and  the cavernous segments are widely patent. There is a 50-60% stenosis of the right internal carotid artery supraclinoid segment. The right middle cerebral artery demonstrates a 50-60% stenosis in the mid M1 segment with flow noted into the distal distribution. The right anterior cerebral artery opacifies into the capillary and venous phases with prompt cross-filling via the anterior communicating artery of the left anterior cerebral artery A2 segment and distally. The left common carotid arteriogram demonstrates the left external carotid artery and its major branches to be widely patent. The left internal carotid artery at the bulb has a smooth shallow plaque along the posterior wall of the bulb with no significant stenosis by the NASCET criteria. No acute ulcerations are identifiable. More distally, the left internal carotid artery opacifies to the cranial skull base. The petrous and the proximal cavernous segments are widely patent. There is a mild stenosis of the distal cavernous segment with the supraclinoid segment demonstrating near normal patency. The left middle cerebral artery has a mild stenosis in its proximal M1 segment. More distally, the trifurcation branches opacify normally. The left anterior cerebral artery proximal A1 segment demonstrates moderate to severe stenosis with flow noted more distally into the A2 segment. The dominant left vertebral artery has approximately 50% stenosis at its origin. More distally, the vessel is seen to opacify to the cranial skull base. Wide patency is seen of the left vertebrobasilar junction at the level of the left posterior-inferior cerebellar artery. Severe stenosis is seen of the distal left vertebrobasilar junction extending into the proximal basilar artery. Patency is seen of the superior cerebellar arteries and the anterior-inferior cerebellar arteries. Retrograde opacification is seen of the right vertebrobasilar junction with focal significant stenosis just  proximal to the basilar artery. 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 Approximately 50% stenosis of the dominant left vertebral artery at its origin. Approximately 30% stenosis of the non dominant right vertebral artery at its origin, with a moderate to severe stenosis of the distal right vertebrobasilar junction. Approximately 50%-60% stenosis of the right internal carotid artery supraclinoid segment. Approximately 50-60% stenosis of the right middle cerebral artery mid M1 segment. 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 Electronically Signed   By: Julieanne Cotton M.D.   On: 07/17/2022 08:05   IR ANGIO VERTEBRAL SEL VERTEBRAL UNI L MOD SED  Result Date: 07/17/2022 CLINICAL DATA:  Severe vertebrobasilar ischemic symptoms of dizziness, gait imbalance, and lethargy. Severely stenotic mid basilar  and proximal basilar artery on CT angiogram of the head and neck. EXAM: BILATERAL COMMON CAROTID AND INNOMINATE ANGIOGRAPHY COMPARISON:  CT angiogram of the head and neck July 15, 2022. MEDICATIONS: Heparin 1000 units IV. No antibiotic was administered within 1 hour of the procedure. ANESTHESIA/SEDATION: Versed 1 mg IV; Fentanyl 25 mcg IV Moderate Sedation Time:  27 minutes The patient was continuously monitored during the procedure by the interventional radiology nurse under my direct supervision. CONTRAST:  Omnipaque 300 approximately 65 mL. FLUOROSCOPY TIME:  Fluoroscopy Time: 8 minutes 0 seconds (1080 mGy). COMPLICATIONS: None immediate. TECHNIQUE: Informed written consent was obtained from the patient after a thorough discussion of the procedural risks, benefits and alternatives. All questions were  addressed. Maximal Sterile Barrier Technique was utilized including caps, mask, sterile gowns, sterile gloves, sterile drape, hand hygiene and skin antiseptic. A timeout was performed prior to the initiation of the procedure. The right groin was prepped and draped in the usual sterile fashion. Thereafter using modified Seldinger technique, transfemoral access into the right common femoral artery was obtained without difficulty. Over an 0.035 inch guidewire, a 5 French Pinnacle sheath was inserted. Through this, and also over an 0.035 inch guidewire, a 5 [place] JB 1 catheter was advanced to the aortic arch region and selectively positioned in the right common carotid artery, the right vertebral artery, the left common carotid artery and the left vertebral artery. FINDINGS: The innominate arteriogram demonstrates the right subclavian artery proximally in the right common carotid artery to be patent proximally. The non dominant right vertebral artery has a 30% stenosis at its origin. More distally, the vessel is seen to ascend to the cranial skull base. Patency is seen of the right vertebrobasilar junction with a mild-to-moderate tapered stenosis of the distal right vertebrobasilar junction. Severe decrease in caliber is seen of the proximal basilar artery, and in the mid basilar artery. Contrast is noted in the distal basilar artery, the superior cerebellar artery and transiently the anterior cerebral arteries proximally. The right common carotid arteriogram demonstrates the right external carotid artery and its major branches to be widely patent. The right internal carotid artery stenosis proximally with a small ulcerated plaque along the medial wall of the bulb. More distally, the right internal carotid artery opacifies normally to the cranial skull base. The petrous and the cavernous segments are widely patent. There is a 50-60% stenosis of the right internal carotid artery supraclinoid segment. The right middle  cerebral artery demonstrates a 50-60% stenosis in the mid M1 segment with flow noted into the distal distribution. The right anterior cerebral artery opacifies into the capillary and venous phases with prompt cross-filling via the anterior communicating artery of the left anterior cerebral artery A2 segment and distally. The left common carotid arteriogram demonstrates the left external carotid artery and its major branches to be widely patent. The left internal carotid artery at the bulb has a smooth shallow plaque along the posterior wall of the bulb with no significant stenosis by the NASCET criteria. No acute ulcerations are identifiable. More distally, the left internal carotid artery opacifies to the cranial skull base. The petrous and the proximal cavernous segments are widely patent. There is a mild stenosis of the distal cavernous segment with the supraclinoid segment demonstrating near normal patency. The left middle cerebral artery has a mild stenosis in its proximal M1 segment. More distally, the trifurcation branches opacify normally. The left anterior cerebral artery proximal A1 segment demonstrates moderate to severe stenosis with flow noted more distally  into the A2 segment. The dominant left vertebral artery has approximately 50% stenosis at its origin. More distally, the vessel is seen to opacify to the cranial skull base. Wide patency is seen of the left vertebrobasilar junction at the level of the left posterior-inferior cerebellar artery. Severe stenosis is seen of the distal left vertebrobasilar junction extending into the proximal basilar artery. Patency is seen of the superior cerebellar arteries and the anterior-inferior cerebellar arteries. Retrograde opacification is seen of the right vertebrobasilar junction with focal significant stenosis just proximal to the basilar artery. 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 Approximately 50% stenosis of the dominant left vertebral artery at its origin. Approximately 30% stenosis of the non dominant right vertebral artery at its origin, with a moderate to severe stenosis of the distal right vertebrobasilar junction. Approximately 50%-60% stenosis of the right internal carotid artery supraclinoid segment. Approximately 50-60% stenosis of the right middle cerebral artery mid M1 segment. 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 Electronically Signed   By: Julieanne Cotton M.D.   On: 07/17/2022 08:05   IR ANGIO VERTEBRAL SEL SUBCLAVIAN INNOMINATE UNI R MOD SED  Result Date: 07/17/2022 CLINICAL DATA:  Severe vertebrobasilar ischemic symptoms of dizziness, gait imbalance, and lethargy. 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 EXAM: BILATERAL COMMON CAROTID AND INNOMINATE ANGIOGRAPHY COMPARISON:  CT angiogram of the head and neck July 15, 2022. MEDICATIONS: Heparin 1000 units IV. No antibiotic was administered within 1 hour of the procedure. ANESTHESIA/SEDATION: Versed 1 mg IV; Fentanyl 25 mcg IV Moderate Sedation Time:  27 minutes The patient was continuously monitored during the procedure by the interventional radiology nurse under my direct supervision. CONTRAST:  Omnipaque 300 approximately 65 mL. FLUOROSCOPY TIME:  Fluoroscopy Time: 8 minutes 0 seconds (1080 mGy). COMPLICATIONS: None immediate. TECHNIQUE: Informed written consent was obtained from the patient after a thorough discussion of the procedural risks, benefits and alternatives. All questions were addressed. Maximal Sterile Barrier Technique was utilized including caps, mask, sterile gowns, sterile gloves, sterile drape, hand  hygiene and skin antiseptic. A timeout was performed prior to the initiation of the procedure. The right groin was prepped and draped in the usual sterile fashion. Thereafter using modified Seldinger technique, transfemoral access into the right common femoral artery was obtained without difficulty. Over an 0.035 inch guidewire, a 5 French Pinnacle sheath was inserted. Through this, and also over an 0.035 inch guidewire, a 5 [place] JB 1 catheter was advanced to the aortic arch region and selectively positioned in the right common carotid artery, the right vertebral artery, the left common carotid artery and the left vertebral artery. FINDINGS: The innominate arteriogram demonstrates the right subclavian artery proximally in the right common carotid artery to be patent proximally. The non dominant right vertebral artery has a 30% stenosis at its origin. More distally, the vessel is seen to ascend to the cranial skull base. Patency is seen of the right vertebrobasilar junction with a mild-to-moderate tapered stenosis of the distal right vertebrobasilar junction. Severe decrease in caliber is seen of the proximal basilar artery, and in the mid basilar artery. Contrast is noted in the distal basilar artery, the superior cerebellar artery and transiently the anterior  cerebral arteries proximally. The right common carotid arteriogram demonstrates the right external carotid artery and its major branches to be widely patent. The right internal carotid artery stenosis proximally with a small ulcerated plaque along the medial wall of the bulb. More distally, the right internal carotid artery opacifies normally to the cranial skull base. The petrous and the cavernous segments are widely patent. There is a 50-60% stenosis of the right internal carotid artery supraclinoid segment. The right middle cerebral artery demonstrates a 50-60% stenosis in the mid M1 segment with flow noted into the distal distribution. The right anterior  cerebral artery opacifies into the capillary and venous phases with prompt cross-filling via the anterior communicating artery of the left anterior cerebral artery A2 segment and distally. The left common carotid arteriogram demonstrates the left external carotid artery and its major branches to be widely patent. The left internal carotid artery at the bulb has a smooth shallow plaque along the posterior wall of the bulb with no significant stenosis by the NASCET criteria. No acute ulcerations are identifiable. More distally, the left internal carotid artery opacifies to the cranial skull base. The petrous and the proximal cavernous segments are widely patent. There is a mild stenosis of the distal cavernous segment with the supraclinoid segment demonstrating near normal patency. The left middle cerebral artery has a mild stenosis in its proximal M1 segment. More distally, the trifurcation branches opacify normally. The left anterior cerebral artery proximal A1 segment demonstrates moderate to severe stenosis with flow noted more distally into the A2 segment. The dominant left vertebral artery has approximately 50% stenosis at its origin. More distally, the vessel is seen to opacify to the cranial skull base. Wide patency is seen of the left vertebrobasilar junction at the level of the left posterior-inferior cerebellar artery. Severe stenosis is seen of the distal left vertebrobasilar junction extending into the proximal basilar artery. Patency is seen of the superior cerebellar arteries and the anterior-inferior cerebellar arteries. Retrograde opacification is seen of the right vertebrobasilar junction with focal significant stenosis just proximal to the basilar artery. 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 Approximately 50% stenosis of the dominant left vertebral artery at its origin.  Approximately 30% stenosis of the non dominant right vertebral artery at its origin, with a moderate to severe stenosis of the distal right vertebrobasilar junction. Approximately 50%-60% stenosis of the right internal carotid artery supraclinoid segment. Approximately 50-60% stenosis of the right middle cerebral artery mid M1 segment. 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 Electronically Signed   By: Julieanne CottonSanjeev  Deveshwar M.D.   On: 07/17/2022 08:05   MR BRAIN WO CONTRAST  Result Date: 07/16/2022 CLINICAL DATA:  Stroke follow-up EXAM: MRI HEAD WITHOUT CONTRAST TECHNIQUE: Multiplanar, multiecho pulse sequences of the brain and surrounding structures were obtained without intravenous contrast. Four sequences were performed COMPARISON:  Head CT 07/15/2022 FINDINGS: There is a small acute infarct of the right paramedian ventral pons. No other area of acute ischemia. There are old bilateral occipital infarcts and an old left lentiform nucleus infarct. There is multifocal periventricular white matter hyperintensity, most often a result of chronic microvascular ischemia. No acute hemorrhage. IMPRESSION: Small acute infarct of the right paramedian ventral pons. Electronically Signed   By: Caryn BeeKevin  Chase Picket M.D.   On: 07/16/2022 22:06   ECHOCARDIOGRAM COMPLETE  Result Date: 07/16/2022    ECHOCARDIOGRAM REPORT   Patient Name:   Mitchell Washington Date of Exam: 07/16/2022 Medical Rec #:  [id number]        Height:       69.0 in Accession #:    [id number]       Weight:       179.2 lb Date of Birth:  24-Apr-1951         BSA:          1.972 m Patient Age:    71 years         BP:           151/71 mmHg Patient Gender: M                HR:           50 bpm. Exam Location:  Inpatient Procedure: 2D Echo, Cardiac Doppler and Color Doppler  Indications:    TIA G45.9  History:        Patient has no prior history of Echocardiogram examinations.                 Stroke; Risk Factors:Hypertension, Dyslipidemia and Diabetes.  Sonographer:    Eulah Pont RDCS Referring Phys: [id number] SARA-MAIZ A THOMAS IMPRESSIONS  1. Left ventricular ejection fraction, by estimation, is 60 to 65%. The left ventricle has normal function. The left ventricle has no regional wall motion abnormalities. Left ventricular diastolic parameters are indeterminate.  2. Right ventricular systolic function is normal. The right ventricular size is normal. Tricuspid regurgitation signal is inadequate for assessing PA pressure.  3. Left atrial size was mildly dilated.  4. The mitral valve is normal in structure. Trivial mitral valve regurgitation. No evidence of mitral stenosis.  5. The aortic valve is tricuspid. There is mild thickening of the aortic valve. Aortic valve regurgitation is not visualized. Aortic valve sclerosis is present, with no evidence of aortic valve stenosis.  6. The inferior vena cava is normal in size with greater than 50% respiratory variability, suggesting right atrial pressure of 3 mmHg. FINDINGS  Left Ventricle: Left ventricular ejection fraction, by estimation, is 60 to 65%. The left ventricle has normal function. The left ventricle has no regional wall motion abnormalities. The left ventricular internal cavity size was normal in size. There is  no left ventricular hypertrophy. Left ventricular diastolic parameters are indeterminate. Right Ventricle: The right ventricular size is normal. No increase in right ventricular wall thickness. Right ventricular systolic function is normal. Tricuspid regurgitation signal is inadequate for assessing PA pressure. Left Atrium: Left atrial size was mildly dilated. Right Atrium: Right atrial size was normal in size. Pericardium: There is no evidence of pericardial effusion. Mitral Valve: The mitral valve is normal in  structure. Mild mitral annular calcification. Trivial mitral valve regurgitation. No evidence of mitral valve stenosis. Tricuspid Valve: The tricuspid valve is normal in structure. Tricuspid valve regurgitation is not demonstrated. Aortic Valve: The aortic valve is tricuspid. There is mild thickening of the aortic valve. Aortic valve regurgitation is not visualized. Aortic valve sclerosis is present, with no evidence of aortic valve stenosis. Pulmonic Valve: The pulmonic valve was grossly normal. Pulmonic valve regurgitation is not visualized. Aorta: The aortic root and ascending aorta are structurally normal, with no evidence of dilitation. Venous: The inferior vena cava is normal in size with greater than 50% respiratory variability, suggesting right atrial pressure of 3 mmHg.  IAS/Shunts: No atrial level shunt detected by color flow Doppler.  LEFT VENTRICLE PLAX 2D LVIDd:         4.50 cm     Diastology LVIDs:         2.60 cm     LV e' medial:    5.76 cm/s LV PW:         0.90 cm     LV E/e' medial:  14.7 LV IVS:        0.90 cm     LV e' lateral:   8.43 cm/s LVOT diam:     2.00 cm     LV E/e' lateral: 10.1 LV SV:         69 LV SV Index:   35 LVOT Area:     3.14 cm  LV Volumes (MOD) LV vol d, MOD A2C: 76.2 ml LV vol d, MOD A4C: 93.5 ml LV vol s, MOD A2C: 28.6 ml LV vol s, MOD A4C: 36.4 ml LV SV MOD A2C:     47.6 ml LV SV MOD A4C:     93.5 ml LV SV MOD BP:      52.2 ml RIGHT VENTRICLE RV S prime:     11.60 cm/s TAPSE (M-mode): 1.9 cm LEFT ATRIUM             Index        RIGHT ATRIUM           Index LA diam:        3.90 cm 1.98 cm/m   RA Area:     12.70 cm LA Vol (A2C):   26.7 ml 13.54 ml/m  RA Volume:   24.60 ml  12.47 ml/m LA Vol (A4C):   29.2 ml 14.81 ml/m LA Biplane Vol: 27.9 ml 14.15 ml/m  AORTIC VALVE LVOT Vmax:   88.80 cm/s LVOT Vmean:  55.300 cm/s LVOT VTI:    0.220 m  AORTA Ao Root diam: 2.90 cm Ao Asc diam:  3.30 cm MITRAL VALVE MV Area (PHT): 2.87 cm    SHUNTS MV Decel Time: 264 msec    Systemic VTI:   0.22 m MV E velocity: 84.90 cm/s  Systemic Diam: 2.00 cm MV A velocity: 82.30 cm/s MV E/A ratio:  1.03 Mihai Croitoru MD Electronically signed by Thurmon Fair MD Signature Date/Time: 07/16/2022/12:32:57 PM    Final    MR BRAIN WO CONTRAST  Result Date: 07/15/2022 CLINICAL DATA:  Neuro deficit, acute, stroke suspected. Gait disturbance. Headache. Dementia. EXAM: MRI HEAD WITHOUT CONTRAST TECHNIQUE: Multiplanar, multiecho pulse sequences of the brain and surrounding structures were obtained without intravenous contrast. COMPARISON:  CT studies earlier same day FINDINGS: Brain: Diffusion imaging does not show any acute or subacute infarction. No focal abnormality affects the brainstem. There are numerous old small vessel cerebellar infarctions. Cerebral hemispheres show old infarctions in the PCA territories more extensive on the left than the right with encephalomalacia and gliosis. There is old infarction in the left basal ganglia and external capsule region. Mild chronic small-vessel ischemic changes seen elsewhere within the hemispheric white matter. Hemosiderin deposition is present in association with the old left basal ganglia/external capsule infarction. No mass lesion, recent hemorrhage, hydrocephalus or extra-axial collection. Vascular: Major vessels at the base of the brain show flow. Skull and upper cervical spine: Negative Sinuses/Orbits: Small amount of mucoid fluid in the left division of the sphenoid sinus. Mild mucosal thickening elsewhere consistent with seasonal rhinitis. Other: None IMPRESSION: 1. No acute or reversible finding. Extensive  old infarctions affecting the cerebellum, both PCA territories and the left basal ganglia/external capsule region. Mild chronic small-vessel ischemic changes elsewhere affecting the brain as outlined above. 2. Mild mucosal thickening and mucoid fluid in the left division of the sphenoid sinus consistent with seasonal rhinitis. Electronically Signed   By:  Paulina Fusi M.D.   On: 07/15/2022 12:59    Scheduled Meds:  aspirin EC  325 mg Oral Daily   atorvastatin  80 mg Oral Daily   clopidogrel  75 mg Oral Daily   insulin aspart  0-9 Units Subcutaneous TID AC & HS   Continuous Infusions:  sodium chloride 75 mL/hr at 07/17/22 1146   heparin 1,000 Units/hr (07/17/22 1004)     LOS: 1 day   Time spent:  Azucena Fallen, DO [facility]  If 7PM-7AM, please contact night-coverage [email or web address]  07/17/2022, 11:52 AM

## 2022-07-17 NOTE — Progress Notes (Addendum)
STROKE TEAM PROGRESS NOTE   INTERVAL HISTORY Patient is seen in his room with multiple family members at bedside.  Cerebral angiogram showed 50% stenosis of the left VA origin, preocclusive mid basilar and proximal basilar artery stenosis, tapered stenosis of the nondominant right vertebral basal junction, 50% stenosis of the right ICA supraclinoid segment. Poor filling of the PCAs bilaterally due to severe proximal stenosis of the basilar artery and moderate-to-severe stenosis of the proximal left ACA A1 segment. Following angiography, patient developed right upper quadrantanopsia and lower visual field decreased visual acuity. Patient was placed on IV heparin and is planned for left artery stenting 11/30. P2Y12 in process.  No further neurologic decline or changes noted overnight.   MRI brain 11/28 with evidence of a small acute infarct of the right paramedian ventral pons.   Vitals:   07/16/22 2318 07/17/22 0322 07/17/22 0755 07/17/22 1153  BP: (!) 177/67 (!) 165/72 (!) 168/77 (!) 172/65  Pulse: (!) 51 (!) 55 (!) 56 (!) 58  Resp: 17 17 18 18   Temp: 98.7 F (37.1 C) 98.2 F (36.8 C) 98.6 F (37 C) 99.1 F (37.3 C)  TempSrc: Oral Oral Oral Oral  SpO2: 97% 98% 98% 97%  Weight:      Height:       CBC:  Recent Labs  Lab 07/15/22 1040 07/16/22 0510 07/17/22 0656  WBC 6.0 6.1 7.1  NEUTROABS 3.4  --   --   HGB 13.6 13.2 13.2  HCT 42.4 40.6 40.4  MCV 82.7 83.4 83.1  PLT 313 313 308    Basic Metabolic Panel:  Recent Labs  Lab 07/16/22 0510 07/17/22 0656  NA 140 139  K 3.8 3.7  CL 105 105  CO2 25 24  GLUCOSE 76 94  BUN 18 12  CREATININE 1.60* 1.53*  CALCIUM 9.1 8.6*    Lipid Panel:  Recent Labs  Lab 07/16/22 0510  CHOL 105  TRIG 80  HDL 31*  CHOLHDL 3.4  VLDL 16  LDLCALC 58    HgbA1c:  Recent Labs  Lab 07/16/22 0510  HGBA1C 6.5*   Urine Drug Screen:  Recent Labs  Lab 07/16/22 0709  LABOPIA NONE DETECTED  COCAINSCRNUR NONE DETECTED  LABBENZ NONE  DETECTED  AMPHETMU NONE DETECTED  THCU NONE DETECTED  LABBARB NONE DETECTED     Alcohol Level  Recent Labs  Lab 07/15/22 1040  ETH <10    IMAGING past 24 hours MR BRAIN WO CONTRAST  Result Date: 07/16/2022 CLINICAL DATA:  Stroke follow-up EXAM: MRI HEAD WITHOUT CONTRAST TECHNIQUE: Multiplanar, multiecho pulse sequences of the brain and surrounding structures were obtained without intravenous contrast. Four sequences were performed COMPARISON:  Head CT 07/15/2022 FINDINGS: There is a small acute infarct of the right paramedian ventral pons. No other area of acute ischemia. There are old bilateral occipital infarcts and an old left lentiform nucleus infarct. There is multifocal periventricular white matter hyperintensity, most often a result of chronic microvascular ischemia. No acute hemorrhage. IMPRESSION: Small acute infarct of the right paramedian ventral pons. Electronically Signed   By: 07/17/2022 M.D.   On: 07/16/2022 22:06    PHYSICAL EXAM General: Alert, well-nourished, well-developed elderly patient in no acute distress Respiratory: Regular, unlabored respirations on room air  NEURO:  Mental Status: AA&Ox3  Speech/Language: speech is dysarthric, oriented to place, people, and time but speech is with paraphasic errors. Naming is intact. Intermittent trouble with word-finding in conversation.  Fluency, and comprehension intact.  Cranial Nerves:  II:  PERRL. Right upper quadrantanopsia.   III, IV, VI: EOMI. Eyelids elevate symmetrically.  V: Sensation is intact to light touch and symmetrical to face.  VII: Left mouth droop. VIII: hearing intact to voice. IX, X: Phonation is normal.  YT:[id number] shrug 5/5. XII: Tongue is midline without fasciculations. Motor: 5/5 strength present in the right upper and lower extremities. There is some decreased left upper extremity and left lower extremity strength on the left compared to the right. LUE 4/5 grip strength, biceps, and  triceps. Left lower extremity 3/5 strength with some attempt at antigravity movement with vertical drift to bed. Tone: is normal and bulk is normal Sensation- Intact to light touch bilaterally.  Coordination: FTN intact on the right Gait- deferred  ASSESSMENT/PLAN [name] is a 71 y.o. male with history of stroke, hypertension, hyperlipidemia, memory loss, REM sleep behavior disorder, tremor and dementia presenting with  difficulty with ambulation and maintaining equilibrium which started on 07/14/2022.  He did not immediately seek treatment for this, but noticed yesterday that symptoms were recurring when he attempted to walk.  Upon arrival to the ED, he was found to have left facial droop as well as dysmetria.  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  He will be evaluated by Dr. Corliss Skains for for angiogram today.  Stroke: posterior stroke due to severe posterior circulation stenosis Code Stroke CT head No acute abnormality.  Multiple chronic infarcts involving left basal ganglia and posterior circulation.  ASPECTS 10.    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 noted on CTA Multiple chronic infarcts noted in posterior circulation Patient to be evaluated today with angiogram  Plan for stenting tomorrow 11/30  Hypertension Home meds: Amlodipine 5 mg daily, lisinopril 40 mg daily Stable Keep SBP 140-180 before intervention given basilar artery stenosis  Long-term BP goal normotensive  Hyperlipidemia Home meds: Atorvastatin 10 mg daily LDL 58, goal < 70 Increased to 80 mg daily Continue statin at discharge  Other Stroke Risk Factors Advanced Age >/= 47  Former cigarette smoker Hx stroke  Other Active Problems None  Hospital day # 1  Kara Mead , MSN, AGACNP-BC [facility] See Amion for schedule and pager information 07/17/2022 4:06 PM   ATTENDING NOTE: I reviewed above note and agree with the assessment and plan. Pt was seen and examined.   Wife at bedside.  Patient lying in bed, on bedrest, still has left facial droop and dysarthria, left hemiparesis, stable from yesterday.  Currently on heparin IV and aspirin and Plavix.  BP goal 1 40-80 before basilar artery intervention.  MRI repeat showed right pontine infarct.  Discussed with Dr. Corliss Skains, plan for basilar artery stenting tomorrow.  Continue statin.  We  will follow  For detailed assessment and plan, please refer to above/below as I have made changes wherever appropriate.   Rosalin Hawking, MD PhD Stroke Neurology 07/17/2022 7:41 PM    To contact Stroke Continuity provider, please refer to [email or web address] After hours, contact General Neurology

## 2022-07-17 NOTE — Progress Notes (Signed)
OT Cancellation Note  Patient Details Name: SAMAY DELCARLO MRN: [id number] DOB: 12/20/1950   Cancelled Treatment:    Reason Eval/Treat Not Completed: Active bedrest order.   Pt now on bedrest. Plans for IR procedure 11/30. Will follow-up after procedure.    Tyler Deis, OTR/L [facility] Acute Rehabilitation Office: [phone]   Myrla Halsted 07/17/2022, 7:32 AM

## 2022-07-18 ENCOUNTER — Encounter (HOSPITAL_COMMUNITY)
Admission: RE | Disposition: A | Discharge status: Rehabilitation Facility | Payer: Self-pay | Source: Ambulatory Visit | Attending: Internal Medicine

## 2022-07-18 ENCOUNTER — Inpatient Hospital Stay (HOSPITAL_COMMUNITY): Payer: Medicare HMO | Admitting: Anesthesiology

## 2022-07-18 ENCOUNTER — Inpatient Hospital Stay (HOSPITAL_COMMUNITY): Payer: Medicare HMO

## 2022-07-18 ENCOUNTER — Encounter (HOSPITAL_COMMUNITY): Payer: Self-pay | Admitting: Internal Medicine

## 2022-07-18 DIAGNOSIS — I6350 Cerebral infarction due to unspecified occlusion or stenosis of unspecified cerebral artery: Secondary | ICD-10-CM | POA: Diagnosis not present

## 2022-07-18 DIAGNOSIS — Z87891 Personal history of nicotine dependence: Secondary | ICD-10-CM | POA: Diagnosis not present

## 2022-07-18 DIAGNOSIS — I651 Occlusion and stenosis of basilar artery: Secondary | ICD-10-CM | POA: Diagnosis not present

## 2022-07-18 DIAGNOSIS — I10 Essential (primary) hypertension: Secondary | ICD-10-CM | POA: Diagnosis not present

## 2022-07-18 DIAGNOSIS — E119 Type 2 diabetes mellitus without complications: Secondary | ICD-10-CM | POA: Diagnosis not present

## 2022-07-18 DIAGNOSIS — I639 Cerebral infarction, unspecified: Secondary | ICD-10-CM | POA: Diagnosis not present

## 2022-07-18 DIAGNOSIS — I6322 Cerebral infarction due to unspecified occlusion or stenosis of basilar arteries: Secondary | ICD-10-CM | POA: Diagnosis present

## 2022-07-18 HISTORY — PX: IR PTA INTRACRANIAL: IMG2344

## 2022-07-18 HISTORY — PX: RADIOLOGY WITH ANESTHESIA: SHX6223

## 2022-07-18 HISTORY — PX: IR CT HEAD LTD: IMG2386

## 2022-07-18 LAB — GLUCOSE, CAPILLARY
Glucose-Capillary: 95 mg/dL (ref 70–99)
Glucose-Capillary: 118 mg/dL — ABNORMAL HIGH (ref 70–99)
Glucose-Capillary: 118 mg/dL — ABNORMAL HIGH (ref 70–99)
Glucose-Capillary: 107 mg/dL — ABNORMAL HIGH (ref 70–99)
Glucose-Capillary: 170 mg/dL — ABNORMAL HIGH (ref 70–99)
Glucose-Capillary: 240 mg/dL — ABNORMAL HIGH (ref 70–99)

## 2022-07-18 LAB — CBC
HCT: 38.1 % — ABNORMAL LOW (ref 39.0–52.0)
Hemoglobin: 12.5 g/dL — ABNORMAL LOW (ref 13.0–17.0)
MCH: 26.9 pg (ref 26.0–34.0)
MCHC: 32.8 g/dL (ref 30.0–36.0)
MCV: 81.9 fL (ref 80.0–100.0)
Platelets: 297 10*3/uL (ref 150–400)
RBC: 4.65 MIL/uL (ref 4.22–5.81)
RDW: 12.8 % (ref 11.5–15.5)
WBC: 9.1 10*3/uL (ref 4.0–10.5)
nRBC: 0 % (ref 0.0–0.2)

## 2022-07-18 LAB — HEPARIN LEVEL (UNFRACTIONATED): Heparin Unfractionated: 0.55 IU/mL (ref 0.30–0.70)

## 2022-07-18 LAB — POCT ACTIVATED CLOTTING TIME
Activated Clotting Time: 197 seconds
Activated Clotting Time: 233 seconds

## 2022-07-18 LAB — MRSA NEXT GEN BY PCR, NASAL: MRSA by PCR Next Gen: NOT DETECTED

## 2022-07-18 SURGERY — IR WITH ANESTHESIA
Anesthesia: General

## 2022-07-18 MED ORDER — FENTANYL CITRATE (PF) 250 MCG/5ML IJ SOLN
INTRAMUSCULAR | Status: DC | PRN
  Administered 2022-07-18: 100 ug via INTRAVENOUS

## 2022-07-18 MED ORDER — ACETAMINOPHEN 160 MG/5ML PO SOLN: 650.0000 mg | ORAL | Status: DC | PRN

## 2022-07-18 MED ORDER — CLEVIDIPINE BUTYRATE 0.5 MG/ML IV EMUL
INTRAVENOUS | Status: AC
  Filled 2022-07-18: qty 100

## 2022-07-18 MED ORDER — ACETAMINOPHEN 650 MG RE SUPP: 650.0000 mg | RECTAL | Status: DC | PRN

## 2022-07-18 MED ORDER — SODIUM CHLORIDE 0.9 % IV SOLN: INTRAVENOUS | Status: DC

## 2022-07-18 MED ORDER — NITROGLYCERIN 1 MG/10 ML FOR IR/CATH LAB: INTRA_ARTERIAL | Status: AC | PRN

## 2022-07-18 MED ORDER — CLOPIDOGREL BISULFATE 75 MG PO TABS: 75.0000 mg | ORAL_TABLET | Freq: Every day | ORAL | Status: DC

## 2022-07-18 MED ORDER — ROCURONIUM BROMIDE 10 MG/ML (PF) SYRINGE
PREFILLED_SYRINGE | INTRAVENOUS | Status: DC | PRN
  Administered 2022-07-18: 70 mg via INTRAVENOUS
  Administered 2022-07-18: 10 mg via INTRAVENOUS
  Administered 2022-07-18: 20 mg via INTRAVENOUS

## 2022-07-18 MED ORDER — TICAGRELOR 90 MG PO TABS
90.0000 mg | ORAL_TABLET | Freq: Two times a day (BID) | ORAL | Status: DC
  Administered 2022-07-19 – 2022-07-24 (×11): 90 mg via ORAL
  Filled 2022-07-18 (×11): qty 1

## 2022-07-18 MED ORDER — CEFAZOLIN SODIUM-DEXTROSE 2-4 GM/100ML-% IV SOLN
INTRAVENOUS | Status: AC
  Filled 2022-07-18: qty 100

## 2022-07-18 MED ORDER — ASPIRIN 81 MG PO CHEW: 81.0000 mg | CHEWABLE_TABLET | Freq: Every day | ORAL | Status: DC

## 2022-07-18 MED ORDER — PROTAMINE SULFATE 10 MG/ML IV SOLN
INTRAVENOUS | Status: DC | PRN
  Administered 2022-07-18: 5 mg via INTRAVENOUS

## 2022-07-18 MED ORDER — IOHEXOL 300 MG/ML  SOLN
150.0000 mL | Freq: Once | INTRAMUSCULAR | Status: AC | PRN
  Administered 2022-07-18: 84 mL via INTRA_ARTERIAL

## 2022-07-18 MED ORDER — PHENYLEPHRINE HCL-NACL 20-0.9 MG/250ML-% IV SOLN
INTRAVENOUS | Status: DC | PRN
  Administered 2022-07-18: 15 ug/min via INTRAVENOUS

## 2022-07-18 MED ORDER — HEPARIN SODIUM (PORCINE) 1000 UNIT/ML IJ SOLN
INTRAMUSCULAR | Status: DC | PRN
  Administered 2022-07-18: 3000 [IU] via INTRAVENOUS
  Administered 2022-07-18: 500 [IU] via INTRAVENOUS

## 2022-07-18 MED ORDER — NITROGLYCERIN 1 MG/10 ML FOR IR/CATH LAB
INTRA_ARTERIAL | Status: AC
  Filled 2022-07-18: qty 10

## 2022-07-18 MED ORDER — CEFAZOLIN SODIUM-DEXTROSE 2-3 GM-%(50ML) IV SOLR
INTRAVENOUS | Status: DC | PRN
  Administered 2022-07-18: 2 g via INTRAVENOUS

## 2022-07-18 MED ORDER — ASPIRIN 81 MG PO CHEW
81.0000 mg | CHEWABLE_TABLET | Freq: Every day | ORAL | Status: DC
  Administered 2022-07-19 – 2022-07-24 (×6): 81 mg via ORAL
  Filled 2022-07-18 (×6): qty 1

## 2022-07-18 MED ORDER — CLEVIDIPINE BUTYRATE 0.5 MG/ML IV EMUL
INTRAVENOUS | Status: AC
  Filled 2022-07-18: qty 50

## 2022-07-18 MED ORDER — CHLORHEXIDINE GLUCONATE CLOTH 2 % EX PADS
6.0000 | MEDICATED_PAD | Freq: Every day | CUTANEOUS | Status: DC
  Administered 2022-07-18 – 2022-07-24 (×7): 6 via TOPICAL

## 2022-07-18 MED ORDER — DEXAMETHASONE SODIUM PHOSPHATE 10 MG/ML IJ SOLN
INTRAMUSCULAR | Status: DC | PRN
  Administered 2022-07-18: 4 mg via INTRAVENOUS

## 2022-07-18 MED ORDER — FENTANYL CITRATE (PF) 100 MCG/2ML IJ SOLN
INTRAMUSCULAR | Status: AC
  Filled 2022-07-18: qty 2

## 2022-07-18 MED ORDER — SUGAMMADEX SODIUM 200 MG/2ML IV SOLN
INTRAVENOUS | Status: DC | PRN
  Administered 2022-07-18: 200 mg via INTRAVENOUS

## 2022-07-18 MED ORDER — LABETALOL HCL 5 MG/ML IV SOLN
INTRAVENOUS | Status: DC | PRN
  Administered 2022-07-18: 5 mg via INTRAVENOUS

## 2022-07-18 MED ORDER — LACTATED RINGERS IV SOLN: INTRAVENOUS | Status: DC

## 2022-07-18 MED ORDER — EPTIFIBATIDE 20 MG/10ML IV SOLN
INTRAVENOUS | Status: AC
  Filled 2022-07-18: qty 10

## 2022-07-18 MED ORDER — CLEVIDIPINE BUTYRATE 0.5 MG/ML IV EMUL
0.0000 mg/h | INTRAVENOUS | Status: DC
  Administered 2022-07-18: 9 mg/h via INTRAVENOUS
  Administered 2022-07-19: 8 mg/h via INTRAVENOUS
  Administered 2022-07-19: 5 mg/h via INTRAVENOUS
  Administered 2022-07-19: 3 mg/h via INTRAVENOUS
  Filled 2022-07-18 (×4): qty 50

## 2022-07-18 MED ORDER — TICAGRELOR 90 MG PO TABS: 90.0000 mg | ORAL_TABLET | Freq: Two times a day (BID) | ORAL | Status: DC

## 2022-07-18 MED ORDER — ORAL CARE MOUTH RINSE: 15.0000 mL | Freq: Once | OROMUCOSAL | Status: AC

## 2022-07-18 MED ORDER — ACETAMINOPHEN 325 MG PO TABS: 650.0000 mg | ORAL_TABLET | ORAL | Status: DC | PRN

## 2022-07-18 MED ORDER — ONDANSETRON HCL 4 MG/2ML IJ SOLN
INTRAMUSCULAR | Status: DC | PRN
  Administered 2022-07-18: 4 mg via INTRAVENOUS

## 2022-07-18 MED ORDER — PROPOFOL 10 MG/ML IV BOLUS
INTRAVENOUS | Status: DC | PRN
  Administered 2022-07-18: 50 mg via INTRAVENOUS
  Administered 2022-07-18: 100 mg via INTRAVENOUS

## 2022-07-18 MED ORDER — LIDOCAINE 2% (20 MG/ML) 5 ML SYRINGE
INTRAMUSCULAR | Status: DC | PRN
  Administered 2022-07-18: 60 mg via INTRAVENOUS

## 2022-07-18 MED ORDER — CLOPIDOGREL BISULFATE 75 MG PO TABS
75.0000 mg | ORAL_TABLET | Freq: Every day | ORAL | Status: AC
  Administered 2022-07-18: 75 mg via ORAL
  Filled 2022-07-18: qty 1

## 2022-07-18 MED ORDER — CHLORHEXIDINE GLUCONATE 0.12 % MT SOLN: 15.0000 mL | Freq: Once | OROMUCOSAL | Status: AC

## 2022-07-18 MED ORDER — CLEVIDIPINE BUTYRATE 0.5 MG/ML IV EMUL
INTRAVENOUS | Status: DC | PRN
  Administered 2022-07-18: 2 mg/h via INTRAVENOUS

## 2022-07-18 MED ORDER — INSULIN ASPART 100 UNIT/ML IJ SOLN: 0.0000 [IU] | INTRAMUSCULAR | Status: DC | PRN

## 2022-07-18 MED ORDER — EPTIFIBATIDE 20 MG/10ML IV SOLN
INTRAVENOUS | Status: AC | PRN
  Administered 2022-07-18: 2.8 mg via INTRAVENOUS
  Administered 2022-07-18: 1 mg via INTRAVENOUS

## 2022-07-18 MED ORDER — CHLORHEXIDINE GLUCONATE 0.12 % MT SOLN
OROMUCOSAL | Status: AC
  Administered 2022-07-18: 15 mL via OROMUCOSAL
  Filled 2022-07-18: qty 15

## 2022-07-18 NOTE — Procedures (Signed)
INR. Status post left vertebral arteriogram.  Right CFA approach.  Findings. 1 pre-occlusive stenosis of the mid basilar artery, and the left vertebral basilar junction. Status post balloon angioplasty x 2 at both levels with  persistent improved caliber to 60%. Significantly  improved caliber of all posterior fossa intracranial vascular noted. Post CT brain demonstrates no evidence of hemorrhage.  8French Angio-Seal closure device deployed for hemostasis  at the right groin puncture site.  Distal pulses all dopplerable with both DP's readily dppplrable and posterior tibials to a lesser degree.   Patient extubated . Recovering slowly.  Pupils 2 to 3 mm bilaterally sluggish.  Follows simple commands by raising his right arm and right leg.  No significant spontaneous movement in the left arm and leg yet. Fatima Sanger MD. D/W spouse.

## 2022-07-18 NOTE — Progress Notes (Signed)
SLP Cancellation Note  Patient Details Name: Mitchell Washington MRN: [id number] DOB: 10-Aug-1951   Cancelled treatment:       Reason Eval/Treat Not Completed: Patient at procedure or test/unavailable (Pt off unit at this time for procedure)  Eris Hannan I. Vear Clock, MS, CCC-SLP Acute Rehabilitation Services Office number [phone]  Scheryl Marten 07/18/2022, 10:23 AM

## 2022-07-18 NOTE — Sedation Documentation (Signed)
8Fr. Angio-seal inserted in to right groin.

## 2022-07-18 NOTE — H&P (Addendum)
HPI:  The patient has had a H&P performed within the last 30 days, all history, medications, and exam have been reviewed. The patient denies any interval changes since the H&P.   Medications: Prior to Admission medications   Medication Sig Start Date End Date Taking? Authorizing Provider  amLODipine (NORVASC) 5 MG tablet Take 5 mg by mouth daily. 01/10/20  Yes [provider]  aspirin EC 81 MG tablet Take 81 mg by mouth daily.   Yes [provider]  atorvastatin (LIPITOR) 10 MG tablet Take 10 mg by mouth daily. 07/06/18  Yes [provider]  clonazePAM (KLONOPIN) 0.5 MG tablet Take 0.5 mg by mouth at bedtime as needed for anxiety. 03/17/22  Yes [provider]  [employer] VITAMIN B12 1000 MCG tablet Take 1,000 mcg by mouth daily. 09/21/18  Yes [provider]  cyanocobalamin 1000 MCG tablet Take 1 tablet by mouth daily. 08/22/20  Yes [provider]  donepezil (ARICEPT) 10 MG tablet Take 10 mg by mouth at bedtime. 08/27/18  Yes [provider]  glipiZIDE (GLUCOTROL XL) 5 MG 24 hr tablet Take 5 mg by mouth daily with breakfast. 01/03/20 07/16/22 Yes [provider]  lisinopril (ZESTRIL) 40 MG tablet Take 40 mg by mouth daily. 09/23/21  Yes [provider]  memantine (NAMENDA) 10 MG tablet Take 10 mg by mouth 2 (two) times daily. 12/20/19  Yes [provider]  metFORMIN (GLUCOPHAGE-XR) 500 MG 24 hr tablet Take 500 mg by mouth 2 (two) times daily with a meal. 01/15/18 07/16/22 Yes [provider]  famotidine (PEPCID) 20 MG tablet Take 1 tablet (20 mg total) by mouth 2 (two) times daily. Patient not taking: Reported on 07/15/2022 01/21/21   Sharman Cheek, MD  ondansetron (ZOFRAN ODT) 4 MG disintegrating tablet Take 1 tablet (4 mg total) by mouth every 8 (eight) hours as needed for nausea or vomiting. Patient not taking: Reported on 07/16/2022 01/21/21   Sharman Cheek, MD     Vital Signs: BP (!) 198/82 (BP  Location: Left Arm)   Pulse 64   Temp 99.3 F (37.4 C)   Resp 18   Ht 5\' 9"  (1.753 m) Comment: per pt  Wt 179 lb 3.7 oz (81.3 kg)   SpO2 95%   BMI 26.47 kg/m   Physical Exam Constitutional:      Appearance: He is ill-appearing.  HENT:     Head: Normocephalic and atraumatic.     Mouth/Throat:     Pharynx: Oropharynx is clear.  Eyes:     Conjunctiva/sclera: Conjunctivae normal.  Cardiovascular:     Rate and Rhythm: Normal rate.  Pulmonary:     Effort: Pulmonary effort is normal. No respiratory distress.  Skin:    General: Skin is warm and dry.  Neurological:     Mental Status: He is alert and oriented to person, place, and time.     Cranial Nerves: Dysarthria and facial asymmetry present.     Motor: Weakness present.     Comments: Left sided weakness with 3/5 grip, arm raise, and leg raise     Mallampati Score:  MD Evaluation Airway: WNL Heart: WNL Abdomen: WNL Chest/ Lungs: WNL ASA  Classification: 3 Mallampati/Airway Score: Two  Labs: P2Y12: 195  CBC: Recent Labs    07/15/22 1040 07/16/22 0510 07/17/22 0656 07/18/22 0746  WBC 6.0 6.1 7.1 9.1  HGB 13.6 13.2 13.2 12.5*  HCT 42.4 40.6 40.4 38.1*  PLT 313 313 308 297    COAGS: Recent  Labs    07/15/22 1040  INR 1.0  APTT 28    BMP: Recent Labs    07/15/22 1040 07/16/22 0510 07/17/22 0656  NA 141 140 139  K 4.0 3.8 3.7  CL 106 105 105  CO2 25 25 24   GLUCOSE 127* 76 94  BUN 22 18 12   CALCIUM 9.3 9.1 8.6*  CREATININE 1.64* 1.60* 1.53*  GFRNONAA 45* 46* 49*    LIVER FUNCTION TESTS: Recent Labs    07/15/22 1040 07/16/22 0510  BILITOT 0.6 0.4  AST 20 13*  ALT 12 12  ALKPHOS 65 57  PROT 8.2* 7.2  ALBUMIN 4.2 3.6    Assessment/Plan:  Mr. Betsch is resting in bed with wife at bedside.  Exam is performed by Dr. 07/18/22.  The patient is A/Ox3 and prior neuro deficits are present, but improved.  He and wife express understanding the seriousness of the arterial narrowing and risk of  doing nothing versus doing planned angioplasty with possible stenting with today's procedure and desire to proceed.  Given P2Y12 high, will dose an additional Plavix 75mg .   Heparin gtt to stop at 1000.  Discussed plan with RN Cyndie Chime.  Risks and benefits of cerebral angiogram with intervention were discussed with the patient including, but not limited to bleeding, infection, vascular injury, contrast induced renal failure, stroke or even death.  This interventional procedure involves the use of X-rays and because of the nature of the planned procedure, it is possible that we will have prolonged use of X-ray fluoroscopy.  Potential radiation risks to you include (but are not limited to) the following: - A slightly elevated risk for cancer  several years later in life. This risk is typically less than 0.5% percent. This risk is low in comparison to the normal incidence of human cancer, which is 33% for women and 50% for men according to the [employer]. - Radiation induced injury can include skin redness, resembling a rash, tissue breakdown / ulcers and hair loss (which can be temporary or permanent).   The likelihood of either of these occurring depends on the difficulty of the procedure and whether you are sensitive to radiation due to previous procedures, disease, or genetic conditions.   IF your procedure requires a prolonged use of radiation, you will be notified and given written instructions for further action.  It is your responsibility to monitor the irradiated area for the 2 weeks following the procedure and to notify your physician if you are concerned that you have suffered a radiation induced injury.    All of the patient's questions were answered, patient is agreeable to proceed.  Consent signed and in chart.    SignedCorliss Skains 07/18/2022, 8:46 AM   I spent a total of 30 minutes in face to face in clinical consultation, greater than 50% of which was  counseling/coordinating care for cerebral angio

## 2022-07-18 NOTE — Progress Notes (Signed)
Upon first assessment in PACU I found patient would not answer verbally but would follow commands. Patient also had no movement or resistance to gravity on left side. Baseline was documented as having dysarthria/slurring, but was able to answer questions verbally. After allowing time for anesthesia to wear off, patient began to answer a couple questions with one word answers that were heavily slurred and left side had some muscle movement, but I was still concerned. I called Dr. Krista Blue who came to bedside and recommended I call Dr. Corliss Skains, which I did and he said he was concerned and wanted me to call Dr. Roda Shutters with neurology. I called Dr. Roda Shutters and he came to bedside. Patient was on 18mg /hr of cleviprex to maintain SBP 120-140 by the arterial line as per Dr. . Dr. Corliss Skains asked the cleviprex be lowered to 10mg /hr and then to 6mg /hr and that the new BP goal was 130-160. Patient began to be able to answer more questions, still heavily slurred, and also had better resistance to gravity. Relayed new BP goals to 4N ICU RN

## 2022-07-18 NOTE — Progress Notes (Signed)
STROKE TEAM PROGRESS NOTE   INTERVAL HISTORY Patient is seen in PACU after basilar artery angioplasty.  RN stated that patient has worsening speech difficulty and left sided weakness after procedure.  On arrival, patient BP 130s by cuff pressure and he is on high dose of Cleviprex.  Adjusted Cleviprex dose to allow BP goal 09/18/1958, patient's symptoms mildly improved.  Vitals:   07/18/22 1725 07/18/22 1740 07/18/22 1755 07/18/22 1810  BP: (!) 140/54 131/63 (!) 145/54 (!) 156/56  Pulse: 69 74 72 82  Resp: 15 18 20 17   Temp:      TempSrc:      SpO2: 95% 94% 95% 96%  Weight:      Height:       CBC:  Recent Labs  Lab 07/15/22 1040 07/16/22 0510 07/17/22 0656 07/18/22 0746  WBC 6.0   < > 7.1 9.1  NEUTROABS 3.4  --   --   --   HGB 13.6   < > 13.2 12.5*  HCT 42.4   < > 40.4 38.1*  MCV 82.7   < > 83.1 81.9  PLT 313   < > 308 297   < > = values in this interval not displayed.   Basic Metabolic Panel:  Recent Labs  Lab 07/16/22 0510 07/17/22 0656  NA 140 139  K 3.8 3.7  CL 105 105  CO2 25 24  GLUCOSE 76 94  BUN 18 12  CREATININE 1.60* 1.53*  CALCIUM 9.1 8.6*   Lipid Panel:  Recent Labs  Lab 07/16/22 0510  CHOL 105  TRIG 80  HDL 31*  CHOLHDL 3.4  VLDL 16  LDLCALC 58   HgbA1c:  Recent Labs  Lab 07/16/22 0510  HGBA1C 6.5*   Urine Drug Screen:  Recent Labs  Lab 07/16/22 0709  LABOPIA NONE DETECTED  COCAINSCRNUR NONE DETECTED  LABBENZ NONE DETECTED  AMPHETMU NONE DETECTED  THCU NONE DETECTED  LABBARB NONE DETECTED    Alcohol Level  Recent Labs  Lab 07/15/22 1040  ETH <10   IMAGING past 24 hours No results found.  PHYSICAL EXAM General: Alert, well-nourished, well-developed elderly patient in no acute distress Respiratory: Regular, unlabored respirations on room air  Neuro - awake, alert, eyes open, severe dysarthria with anarthria at times.  Did not answer orientation questions, however following all simple commands. No gaze palsy, tracking  bilaterally, visual field full, PERRL.  Continued left facial droop. Tongue midline.  Right upper extremity at least 4/5 and right lower extremity at least 3/5.  Left upper extremity 3 -/5, left lower extremity 2/5.  Sensation symmetrical bilaterally subjectively the, right FTN intact, gait not tested.    ASSESSMENT/PLAN Mitchell Washington is a 71 y.o. male with history of stroke, hypertension, hyperlipidemia, memory loss, REM sleep behavior disorder, tremor and dementia presenting with  difficulty with ambulation and maintaining equilibrium which started on 07/14/2022.  He did not immediately seek treatment for this, but noticed yesterday that symptoms were recurring when he attempted to walk.  Upon arrival to the ED, he was found to have left facial droop as well as dysmetria.  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  He will be evaluated by Dr. 07/16/2022 for for angiogram today.  Stroke: posterior stroke due to severe posterior circulation stenosis status post basilar artery angioplasty Code Stroke CT head No acute abnormality.  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  ASPECTS 10.    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, moderate stenosis of bilateral carotids CT perfusion 24 mL penumbra in occipital lobes, corresponding with chronic infarcts MRI no acute infarct, multiple chronic infarcts in cerebellum, bilateral PCA territories and left basal ganglia, mild chronic small vessel ischemic changes MRI brain 11/28: small acute infarct of the right paramedial ventral pons MRI repeat pending MRA repeat pending 2D Echo EF 60 to 65%  LDL 58 HgbA1c 6.5 VTE prophylaxis -heparin IV aspirin 81 mg daily prior to admission, now on aspirin 81 mg daily and Brilinta 90 mg twice daily post procedure 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 noted on CTA Multiple chronic infarcts noted in posterior circulation Patient to be evaluated today with angiogram  Status post basilar artery angioplasty 11/30  Hypertension Home meds: Amlodipine 5 mg daily, lisinopril 40 mg daily Stable Keep SBP 130-160 bicarb pressure postprocedure Avoid low BP Long-term BP goal normotensive  Hyperlipidemia Home meds: Atorvastatin 10 mg daily LDL 58, goal < 70 Increased to 80 mg daily Continue statin at discharge  Other Stroke Risk Factors Advanced Age >/= 37  Former cigarette smoker Hx stroke  Other Active Problems None  Hospital day # 2  I discussed with Dr. Corliss Skains. I spent extensive face-to-face time with the patient, more than 50% of which was spent in counseling and coordination of care, reviewing test results, images and medication, and discussing the diagnosis, treatment plan and potential prognosis. This patient's care requiresreview of multiple databases, neurological assessment, discussion with family, other specialists and medical decision making of high complexity.    Marvel Plan, MD PhD Stroke Neurology 07/18/2022 6:35 PM    To contact Stroke Continuity provider, please refer to [email or web address]. After hours, contact General Neurology

## 2022-07-18 NOTE — Sedation Documentation (Signed)
ACT 197

## 2022-07-18 NOTE — Anesthesia Postprocedure Evaluation (Signed)
Anesthesia Post Note  Patient: Mitchell Washington  Procedure(s) Performed: Cerebral angioplasty with possible stenting     Patient location during evaluation: PACU Anesthesia Type: General Level of consciousness: sedated and lethargic Pain management: pain level controlled Vital Signs Assessment: post-procedure vital signs reviewed and stable Respiratory status: spontaneous breathing and respiratory function stable Cardiovascular status: stable Postop Assessment: no apparent nausea or vomiting Anesthetic complications: no   No notable events documented.  Last Vitals:  Vitals:   07/18/22 1655 07/18/22 1710  BP: (!) 135/56 (!) 141/65  Pulse: 65 67  Resp: 15 19  Temp:    SpO2: 95% 95%    Last Pain:  Vitals:   07/18/22 1025  TempSrc: Oral  PainSc:                  Manya Balash DANIEL

## 2022-07-18 NOTE — Progress Notes (Signed)
PT Cancellation Note  Patient Details Name: Mitchell Washington MRN: [id number] DOB: [birth year]   Cancelled Treatment:    Reason Eval/Treat Not Completed: Patient at procedure or test/unavailable;Active bedrest order  Patient was on bedrest this morning pre-procedure and has been in a procedure all day. Will evaluate 12/1 if appropriate.    Jerolyn Center, PT Acute Rehabilitation Services  Office [phone]   Mitchell Washington 07/18/2022, 3:51 PM

## 2022-07-18 NOTE — Sedation Documentation (Signed)
Inflation of Balloon intra-arterial 8.4

## 2022-07-18 NOTE — Sedation Documentation (Signed)
ACT=[id number]

## 2022-07-18 NOTE — Anesthesia Procedure Notes (Signed)
Procedure Name: Intubation Date/Time: 07/18/2022 12:48 PM  Performed by: Lowella Dell, CRNAPre-anesthesia Checklist: Patient identified, Emergency Drugs available, Suction available and Patient being monitored Patient Re-evaluated:Patient Re-evaluated prior to induction Oxygen Delivery Method: Circle System Utilized Preoxygenation: Pre-oxygenation with 100% oxygen Induction Type: IV induction Ventilation: Oral airway inserted - appropriate to patient size and Two handed mask ventilation required Laryngoscope Size: Mac and 4 Grade View: Grade I Tube type: Oral Tube size: 7.5 mm Number of attempts: 1 Airway Equipment and Method: Stylet Placement Confirmation: ETT inserted through vocal cords under direct vision, positive ETCO2 and breath sounds checked- equal and bilateral Secured at: 23 cm Tube secured with: Tape Dental Injury: Teeth and Oropharynx as per pre-operative assessment

## 2022-07-18 NOTE — Progress Notes (Addendum)
PROGRESS NOTE    Mitchell Washington  ZOX:[id number] DOB: 1951-03-13 DOA: 07/16/2022 PCP: Kandyce Rud, MD   Brief Narrative:  Mitchell Washington is a 70 y.o. male with medical history significant of  CVA, DmII, Gout, HLD , HTN ,CKD2, dementia, who presents to [facility] on 07/15/22 with complaint of intermittent unsteady gait and word finding difficulties.  Initial imaging concerning for occlusion, subsequently transferred to [facility] proper for interventional radiology/vascular surgery evaluation.    Assessment & Plan:   Principal Problem:   TIA (transient ischemic attack) Active Problems:   B12 deficiency   Benign essential hypertension   Cerebrovascular disease   Pure hypercholesterolemia   Type 2 diabetes mellitus with peripheral neuropathy (HCC)  TIA w/ small posterior CVA -History of previous CVAs w/o residual defictis -CTA with findings of PCA occlusion /vertebral a occulusion mild to mod  -Planned stenting later this morning 07/18/22 - CT head negative ,-MRI brain 11/28 remarkable for small CVA -asa/plavix ongoing per neurology -Vascular intervention pending radiology schedule   Non insulin-dependent diabetes type 2, well-controlled -iss/fs  Lab Results  Component Value Date   HGBA1C 6.5 (H) 07/16/2022   Gout -no acute flare    HLD  -continue statin    HTN -permissive HTN in the interim   CKD2 -at baseline     Dementia, unspecified -resume home regimen    DVT prophylaxis: Heparin drip Code Status: Full Family Communication: At bedside  Status is: Inpatient  Dispo: The patient is from: Home              Anticipated d/c is to: Home              Anticipated d/c date is: 48 hours              Patient currently not medically stable for discharge given need for intervention as above  Consultants:  Interventional radiology  Procedures:  As above  Antimicrobials:  None  Subjective: No acute issues or events overnight  Objective: Vitals:   07/17/22  1928 07/17/22 2316 07/18/22 0342 07/18/22 0359  BP: (!) 178/71 (!) 172/74 (!) 200/83 (!) 197/72  Pulse: (!) 56 (!) 54 61 (!) 59  Resp: Temp: 98.8 F (37.1 C) 99.2 F (37.3 C) 98.8 F (37.1 C)   TempSrc: Oral Oral Oral   SpO2: 99% 97% 96%   Weight:      Height:        Intake/Output Summary (Last 24 hours) at 07/18/2022 0722 Last data filed at 07/18/2022 0355 Gross per 24 hour  Intake 1377.67 ml  Output 1500 ml  Net -122.33 ml    Filed Weights   07/16/22 0143  Weight: 81.3 kg    Examination:  General exam: Appears calm and comfortable  Respiratory system: Clear to auscultation. Respiratory effort normal. Cardiovascular system: S1 & S2 heard, RRR. No JVD, murmurs, rubs, gallops or clicks. No pedal edema. Gastrointestinal system: Abdomen is nondistended, soft and nontender. No organomegaly or masses felt. Normal bowel sounds heard. Central nervous system: Alert and oriented. No focal neurological deficits. Extremities: Symmetric 5 x 5 power. Skin: No rashes, lesions or ulcers Psychiatry: Judgement and insight appear normal. Mood & affect appropriate.     Data Reviewed: I have personally reviewed following labs and imaging studies  CBC: Recent Labs  Lab 07/15/22 1040 07/16/22 0510 07/17/22 0656  WBC 6.0 6.1 7.1  NEUTROABS 3.4  --   --   HGB 13.6 13.2 13.2  HCT 42.4 40.6 40.4  MCV 82.7 83.4 83.1  PLT 313 313 308    Basic Metabolic Panel: Recent Labs  Lab 07/15/22 1040 07/16/22 0510 07/17/22 0656  NA 141 140 139  K 4.0 3.8 3.7  CL 106 105 105  CO2 GLUCOSE 127* 76 94  BUN CREATININE 1.64* 1.60* 1.53*  CALCIUM 9.3 9.1 8.6*    GFR: Estimated Creatinine Clearance: 44.9 mL/min (A) (by C-G formula based on SCr of 1.53 mg/dL (H)). Liver Function Tests: Recent Labs  Lab 07/15/22 1040 07/16/22 0510  AST 20 13*  ALT 12 12  ALKPHOS 65 57  BILITOT 0.6 0.4  PROT 8.2* 7.2  ALBUMIN 4.2 3.6    No results for input(s):  "LIPASE", "AMYLASE" in the last 168 hours. No results for input(s): "AMMONIA" in the last 168 hours. Coagulation Profile: Recent Labs  Lab 07/15/22 1040  INR 1.0    Cardiac Enzymes: No results for input(s): "CKTOTAL", "CKMB", "CKMBINDEX", "TROPONINI" in the last 168 hours. BNP (last 3 results) No results for input(s): "PROBNP" in the last 8760 hours. HbA1C: Recent Labs    07/16/22 0510  HGBA1C 6.5*    CBG: Recent Labs  Lab 07/17/22 1628 07/17/22 1932 07/17/22 2325 07/18/22 0347 07/18/22 0631  GLUCAP 130* 169* 95 118* 118*    Lipid Profile: Recent Labs    07/16/22 0510  CHOL 105  HDL 31*  LDLCALC 58  TRIG 80  CHOLHDL 3.4    Thyroid Function Tests: No results for input(s): "TSH", "T4TOTAL", "FREET4", "T3FREE", "THYROIDAB" in the last 72 hours. Anemia Panel: No results for input(s): "VITAMINB12", "FOLATE", "FERRITIN", "TIBC", "IRON", "RETICCTPCT" in the last 72 hours. Sepsis Labs: No results for input(s): "PROCALCITON", "LATICACIDVEN" in the last 168 hours.  No results found for this or any previous visit (from the past 240 hour(s)).   Radiology Studies: IR ANGIO INTRA EXTRACRAN SEL COM CAROTID INNOMINATE BILAT MOD SED  Result Date: 07/17/2022 CLINICAL DATA:  Severe vertebrobasilar ischemic symptoms of dizziness, gait imbalance, and lethargy. 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 EXAM: BILATERAL COMMON CAROTID AND INNOMINATE ANGIOGRAPHY COMPARISON:  CT angiogram of the head and neck July 15, 2022. MEDICATIONS: Heparin 1000 units IV. No antibiotic was administered within 1 hour of the procedure. ANESTHESIA/SEDATION: Versed 1 mg IV; Fentanyl 25 mcg IV Moderate Sedation Time:  27 minutes The patient was continuously monitored during the procedure by the interventional radiology nurse under my direct supervision. CONTRAST:  Omnipaque 300 approximately 65 mL. FLUOROSCOPY TIME:  Fluoroscopy Time: 8 minutes 0  seconds (1080 mGy). COMPLICATIONS: None immediate. TECHNIQUE: Informed written consent was obtained from the patient after a thorough discussion of the procedural risks, benefits and alternatives. All questions were addressed. Maximal Sterile Barrier Technique was utilized including caps, mask, sterile gowns, sterile gloves, sterile drape, hand hygiene and skin antiseptic. A timeout was performed prior to the initiation of the procedure. The right groin was prepped and draped in the usual sterile fashion. Thereafter using modified Seldinger technique, transfemoral access into the right common femoral artery was obtained without difficulty. Over an 0.035 inch guidewire, a 5 French Pinnacle sheath was inserted. Through this, and also over an 0.035 inch guidewire, a 5 [place] JB 1 catheter was advanced to the aortic arch region and selectively positioned in the right common carotid artery, the right vertebral artery, the left common carotid artery and the left vertebral artery. FINDINGS: The  innominate arteriogram demonstrates the right subclavian artery proximally in the right common carotid artery to be patent proximally. The non dominant right vertebral artery has a 30% stenosis at its origin. More distally, the vessel is seen to ascend to the cranial skull base. Patency is seen of the right vertebrobasilar junction with a mild-to-moderate tapered stenosis of the distal right vertebrobasilar junction. Severe decrease in caliber is seen of the proximal basilar artery, and in the mid basilar artery. Contrast is noted in the distal basilar artery, the superior cerebellar artery and transiently the anterior cerebral arteries proximally. The right common carotid arteriogram demonstrates the right external carotid artery and its major branches to be widely patent. The right internal carotid artery stenosis proximally with a small ulcerated plaque along the medial wall of the bulb. More distally, the right internal carotid  artery opacifies normally to the cranial skull base. The petrous and the cavernous segments are widely patent. There is a 50-60% stenosis of the right internal carotid artery supraclinoid segment. The right middle cerebral artery demonstrates a 50-60% stenosis in the mid M1 segment with flow noted into the distal distribution. The right anterior cerebral artery opacifies into the capillary and venous phases with prompt cross-filling via the anterior communicating artery of the left anterior cerebral artery A2 segment and distally. The left common carotid arteriogram demonstrates the left external carotid artery and its major branches to be widely patent. The left internal carotid artery at the bulb has a smooth shallow plaque along the posterior wall of the bulb with no significant stenosis by the NASCET criteria. No acute ulcerations are identifiable. More distally, the left internal carotid artery opacifies to the cranial skull base. The petrous and the proximal cavernous segments are widely patent. There is a mild stenosis of the distal cavernous segment with the supraclinoid segment demonstrating near normal patency. The left middle cerebral artery has a mild stenosis in its proximal M1 segment. More distally, the trifurcation branches opacify normally. The left anterior cerebral artery proximal A1 segment demonstrates moderate to severe stenosis with flow noted more distally into the A2 segment. The dominant left vertebral artery has approximately 50% stenosis at its origin. More distally, the vessel is seen to opacify to the cranial skull base. Wide patency is seen of the left vertebrobasilar junction at the level of the left posterior-inferior cerebellar artery. Severe stenosis is seen of the distal left vertebrobasilar junction extending into the proximal basilar artery. Patency is seen of the superior cerebellar arteries and the anterior-inferior cerebellar arteries. Retrograde opacification is seen of the  right vertebrobasilar junction with focal significant stenosis just proximal to the basilar artery. 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 Approximately 50% stenosis of the dominant left vertebral artery at its origin. Approximately 30% stenosis of the non dominant right vertebral artery at its origin, with a moderate to severe stenosis of the distal right vertebrobasilar junction. Approximately 50%-60% stenosis of the right internal carotid artery supraclinoid segment. Approximately 50-60% stenosis of the right middle cerebral artery mid M1 segment. 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 Electronically Signed   By: Julieanne CottonSanjeev  Deveshwar M.D.   On: 07/17/2022 08:05   IR ANGIO VERTEBRAL SEL VERTEBRAL UNI L MOD SED  Result Date: 07/17/2022  CLINICAL DATA:  Severe vertebrobasilar ischemic symptoms of dizziness, gait imbalance, and lethargy. 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 EXAM: BILATERAL COMMON CAROTID AND INNOMINATE ANGIOGRAPHY COMPARISON:  CT angiogram of the head and neck July 15, 2022. MEDICATIONS: Heparin 1000 units IV. No antibiotic was administered within 1 hour of the procedure. ANESTHESIA/SEDATION: Versed 1 mg IV; Fentanyl 25 mcg IV Moderate Sedation Time:  27 minutes The patient was continuously monitored during the procedure by the interventional radiology nurse under my direct supervision. CONTRAST:  Omnipaque 300 approximately 65 mL. FLUOROSCOPY TIME:  Fluoroscopy Time: 8 minutes 0 seconds (1080 mGy). COMPLICATIONS: None immediate. TECHNIQUE: Informed written consent was obtained from the patient after a thorough discussion of  the procedural risks, benefits and alternatives. All questions were addressed. Maximal Sterile Barrier Technique was utilized including caps, mask, sterile gowns, sterile gloves, sterile drape, hand hygiene and skin antiseptic. A timeout was performed prior to the initiation of the procedure. The right groin was prepped and draped in the usual sterile fashion. Thereafter using modified Seldinger technique, transfemoral access into the right common femoral artery was obtained without difficulty. Over an 0.035 inch guidewire, a 5 French Pinnacle sheath was inserted. Through this, and also over an 0.035 inch guidewire, a 5 [place] JB 1 catheter was advanced to the aortic arch region and selectively positioned in the right common carotid artery, the right vertebral artery, the left common carotid artery and the left vertebral artery. FINDINGS: The innominate arteriogram demonstrates the right subclavian artery proximally in the right common carotid artery to be patent proximally. The non dominant right vertebral artery has a 30% stenosis at its origin. More distally, the vessel is seen to ascend to the cranial skull base. Patency is seen of the right vertebrobasilar junction with a mild-to-moderate tapered stenosis of the distal right vertebrobasilar junction. Severe decrease in caliber is seen of the proximal basilar artery, and in the mid basilar artery. Contrast is noted in the distal basilar artery, the superior cerebellar artery and transiently the anterior cerebral arteries proximally. The right common carotid arteriogram demonstrates the right external carotid artery and its major branches to be widely patent. The right internal carotid artery stenosis proximally with a small ulcerated plaque along the medial wall of the bulb. More distally, the right internal carotid artery opacifies normally to the cranial skull base. The petrous and the cavernous segments are widely patent. There is a 50-60% stenosis of the  right internal carotid artery supraclinoid segment. The right middle cerebral artery demonstrates a 50-60% stenosis in the mid M1 segment with flow noted into the distal distribution. The right anterior cerebral artery opacifies into the capillary and venous phases with prompt cross-filling via the anterior communicating artery of the left anterior cerebral artery A2 segment and distally. The left common carotid arteriogram demonstrates the left external carotid artery and its major branches to be widely patent. The left internal carotid artery at the bulb has a smooth shallow plaque along the posterior wall of the bulb with no significant stenosis by the NASCET criteria. No acute ulcerations are identifiable. More distally, the left internal carotid artery opacifies to the cranial skull base. The petrous and the proximal cavernous segments are widely patent. There is a mild stenosis of the distal cavernous segment with the supraclinoid segment demonstrating near normal patency. The left middle cerebral artery has a mild stenosis in its proximal M1 segment. More distally, the trifurcation branches opacify normally. The  left anterior cerebral artery proximal A1 segment demonstrates moderate to severe stenosis with flow noted more distally into the A2 segment. The dominant left vertebral artery has approximately 50% stenosis at its origin. More distally, the vessel is seen to opacify to the cranial skull base. Wide patency is seen of the left vertebrobasilar junction at the level of the left posterior-inferior cerebellar artery. Severe stenosis is seen of the distal left vertebrobasilar junction extending into the proximal basilar artery. Patency is seen of the superior cerebellar arteries and the anterior-inferior cerebellar arteries. Retrograde opacification is seen of the right vertebrobasilar junction with focal significant stenosis just proximal to the basilar artery. IMPRESSION: Severe pre occlusive stenosis of  the mid basilar artery, and of the proximal basilar artery at its junction with the left vertebrobasilar junction. Approximately 50% stenosis of the dominant left vertebral artery at its origin. Approximately 30% stenosis of the non dominant right vertebral artery at its origin, with a moderate to severe stenosis of the distal right vertebrobasilar junction. Approximately 50%-60% stenosis of the right internal carotid artery supraclinoid segment. Approximately 50-60% stenosis of the right middle cerebral artery mid M1 segment. 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 Electronically Signed   By: Julieanne Cotton M.D.   On: 07/17/2022 08:05   IR ANGIO VERTEBRAL SEL SUBCLAVIAN INNOMINATE UNI R MOD SED  Result Date: 07/17/2022 CLINICAL DATA:  Severe vertebrobasilar ischemic symptoms of dizziness, gait imbalance, and lethargy. 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 EXAM: BILATERAL COMMON CAROTID AND INNOMINATE ANGIOGRAPHY COMPARISON:  CT angiogram of the head and neck July 15, 2022. MEDICATIONS: Heparin 1000 units IV. No antibiotic was administered within 1 hour of the procedure. ANESTHESIA/SEDATION: Versed 1 mg IV; Fentanyl 25 mcg IV Moderate Sedation Time:  27 minutes The patient was continuously monitored during the procedure by the interventional radiology nurse under my direct supervision. CONTRAST:  Omnipaque 300 approximately 65 mL. FLUOROSCOPY TIME:  Fluoroscopy Time: 8 minutes 0 seconds (1080 mGy). COMPLICATIONS: None immediate. TECHNIQUE: Informed written consent was obtained from the patient after a thorough discussion of the procedural risks, benefits and alternatives. All questions were addressed. Maximal Sterile Barrier Technique was utilized  including caps, mask, sterile gowns, sterile gloves, sterile drape, hand hygiene and skin antiseptic. A timeout was performed prior to the initiation of the procedure. The right groin was prepped and draped in the usual sterile fashion. Thereafter using modified Seldinger technique, transfemoral access into the right common femoral artery was obtained without difficulty. Over an 0.035 inch guidewire, a 5 French Pinnacle sheath was inserted. Through this, and also over an 0.035 inch guidewire, a 5 [place] JB 1 catheter was advanced to the aortic arch region and selectively positioned in the right common carotid artery, the right vertebral artery, the left common carotid artery and the left vertebral artery. FINDINGS: The innominate arteriogram demonstrates the right subclavian artery proximally in the right common carotid artery to be patent proximally. The non dominant right vertebral artery has a 30% stenosis at its origin. More distally, the vessel is seen to ascend to the cranial skull base. Patency is seen of the right vertebrobasilar junction with a mild-to-moderate tapered stenosis of the distal right vertebrobasilar junction. Severe decrease in caliber is seen of the proximal basilar artery, and in the mid basilar  artery. Contrast is noted in the distal basilar artery, the superior cerebellar artery and transiently the anterior cerebral arteries proximally. The right common carotid arteriogram demonstrates the right external carotid artery and its major branches to be widely patent. The right internal carotid artery stenosis proximally with a small ulcerated plaque along the medial wall of the bulb. More distally, the right internal carotid artery opacifies normally to the cranial skull base. The petrous and the cavernous segments are widely patent. There is a 50-60% stenosis of the right internal carotid artery supraclinoid segment. The right middle cerebral artery demonstrates a 50-60% stenosis in the mid M1  segment with flow noted into the distal distribution. The right anterior cerebral artery opacifies into the capillary and venous phases with prompt cross-filling via the anterior communicating artery of the left anterior cerebral artery A2 segment and distally. The left common carotid arteriogram demonstrates the left external carotid artery and its major branches to be widely patent. The left internal carotid artery at the bulb has a smooth shallow plaque along the posterior wall of the bulb with no significant stenosis by the NASCET criteria. No acute ulcerations are identifiable. More distally, the left internal carotid artery opacifies to the cranial skull base. The petrous and the proximal cavernous segments are widely patent. There is a mild stenosis of the distal cavernous segment with the supraclinoid segment demonstrating near normal patency. The left middle cerebral artery has a mild stenosis in its proximal M1 segment. More distally, the trifurcation branches opacify normally. The left anterior cerebral artery proximal A1 segment demonstrates moderate to severe stenosis with flow noted more distally into the A2 segment. The dominant left vertebral artery has approximately 50% stenosis at its origin. More distally, the vessel is seen to opacify to the cranial skull base. Wide patency is seen of the left vertebrobasilar junction at the level of the left posterior-inferior cerebellar artery. Severe stenosis is seen of the distal left vertebrobasilar junction extending into the proximal basilar artery. Patency is seen of the superior cerebellar arteries and the anterior-inferior cerebellar arteries. Retrograde opacification is seen of the right vertebrobasilar junction with focal significant stenosis just proximal to the basilar artery. 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 Approximately  50% stenosis of the dominant left vertebral artery at its origin. Approximately 30% stenosis of the non dominant right vertebral artery at its origin, with a moderate to severe stenosis of the distal right vertebrobasilar junction. Approximately 50%-60% stenosis of the right internal carotid artery supraclinoid segment. Approximately 50-60% stenosis of the right middle cerebral artery mid M1 segment. 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 Electronically Signed   By: Julieanne Cotton M.D.   On: 07/17/2022 08:05   MR BRAIN WO CONTRAST  Result Date: 07/16/2022 CLINICAL DATA:  Stroke follow-up EXAM: MRI HEAD WITHOUT CONTRAST TECHNIQUE: Multiplanar, multiecho pulse sequences of the brain and surrounding structures were obtained without intravenous contrast. Four sequences were performed COMPARISON:  Head CT 07/15/2022 FINDINGS: There is a small acute infarct of the right paramedian ventral pons. No other area of acute ischemia. There are old bilateral occipital infarcts and an old left lentiform nucleus infarct. There is multifocal periventricular white matter hyperintensity, most often a result of chronic microvascular ischemia. No acute  hemorrhage. IMPRESSION: Small acute infarct of the right paramedian ventral pons. Electronically Signed   By: Deatra Robinson M.D.   On: 07/16/2022 22:06   ECHOCARDIOGRAM COMPLETE  Result Date: 07/16/2022    ECHOCARDIOGRAM REPORT   Patient Name:   Mitchell Washington Date of Exam: 07/16/2022 Medical Rec #:  [id number]        Height:       69.0 in Accession #:    [id number]       Weight:       179.2 lb Date of Birth:  05-06-1951         BSA:          1.972 m Patient Age:    71 years         BP:           151/71 mmHg Patient Gender: M                HR:           50 bpm. Exam Location:   Inpatient Procedure: 2D Echo, Cardiac Doppler and Color Doppler Indications:    TIA G45.9  History:        Patient has no prior history of Echocardiogram examinations.                 Stroke; Risk Factors:Hypertension, Dyslipidemia and Diabetes.  Sonographer:    Eulah Pont RDCS Referring Phys: [id number] SARA-MAIZ A THOMAS IMPRESSIONS  1. Left ventricular ejection fraction, by estimation, is 60 to 65%. The left ventricle has normal function. The left ventricle has no regional wall motion abnormalities. Left ventricular diastolic parameters are indeterminate.  2. Right ventricular systolic function is normal. The right ventricular size is normal. Tricuspid regurgitation signal is inadequate for assessing PA pressure.  3. Left atrial size was mildly dilated.  4. The mitral valve is normal in structure. Trivial mitral valve regurgitation. No evidence of mitral stenosis.  5. The aortic valve is tricuspid. There is mild thickening of the aortic valve. Aortic valve regurgitation is not visualized. Aortic valve sclerosis is present, with no evidence of aortic valve stenosis.  6. The inferior vena cava is normal in size with greater than 50% respiratory variability, suggesting right atrial pressure of 3 mmHg. FINDINGS  Left Ventricle: Left ventricular ejection fraction, by estimation, is 60 to 65%. The left ventricle has normal function. The left ventricle has no regional wall motion abnormalities. The left ventricular internal cavity size was normal in size. There is  no left ventricular hypertrophy. Left ventricular diastolic parameters are indeterminate. Right Ventricle: The right ventricular size is normal. No increase in right ventricular wall thickness. Right ventricular systolic function is normal. Tricuspid regurgitation signal is inadequate for assessing PA pressure. Left Atrium: Left atrial size was mildly dilated. Right Atrium: Right atrial size was normal in size. Pericardium: There is no evidence of  pericardial effusion. Mitral Valve: The mitral valve is normal in structure. Mild mitral annular calcification. Trivial mitral valve regurgitation. No evidence of mitral valve stenosis. Tricuspid Valve: The tricuspid valve is normal in structure. Tricuspid valve regurgitation is not demonstrated. Aortic Valve: The aortic valve is tricuspid. There is mild thickening of the aortic valve. Aortic valve regurgitation is not visualized. Aortic valve sclerosis is present, with no evidence of aortic valve stenosis. Pulmonic Valve: The pulmonic valve was grossly normal. Pulmonic valve regurgitation is not visualized. Aorta: The aortic root and ascending aorta are structurally normal, with no evidence of dilitation. Venous: The inferior vena  cava is normal in size with greater than 50% respiratory variability, suggesting right atrial pressure of 3 mmHg. IAS/Shunts: No atrial level shunt detected by color flow Doppler.  LEFT VENTRICLE PLAX 2D LVIDd:         4.50 cm     Diastology LVIDs:         2.60 cm     LV e' medial:    5.76 cm/s LV PW:         0.90 cm     LV E/e' medial:  14.7 LV IVS:        0.90 cm     LV e' lateral:   8.43 cm/s LVOT diam:     2.00 cm     LV E/e' lateral: 10.1 LV SV:         69 LV SV Index:   35 LVOT Area:     3.14 cm  LV Volumes (MOD) LV vol d, MOD A2C: 76.2 ml LV vol d, MOD A4C: 93.5 ml LV vol s, MOD A2C: 28.6 ml LV vol s, MOD A4C: 36.4 ml LV SV MOD A2C:     47.6 ml LV SV MOD A4C:     93.5 ml LV SV MOD BP:      52.2 ml RIGHT VENTRICLE RV S prime:     11.60 cm/s TAPSE (M-mode): 1.9 cm LEFT ATRIUM             Index        RIGHT ATRIUM           Index LA diam:        3.90 cm 1.98 cm/m   RA Area:     12.70 cm LA Vol (A2C):   26.7 ml 13.54 ml/m  RA Volume:   24.60 ml  12.47 ml/m LA Vol (A4C):   29.2 ml 14.81 ml/m LA Biplane Vol: 27.9 ml 14.15 ml/m  AORTIC VALVE LVOT Vmax:   88.80 cm/s LVOT Vmean:  55.300 cm/s LVOT VTI:    0.220 m  AORTA Ao Root diam: 2.90 cm Ao Asc diam:  3.30 cm MITRAL VALVE MV Area  (PHT): 2.87 cm    SHUNTS MV Decel Time: 264 msec    Systemic VTI:  0.22 m MV E velocity: 84.90 cm/s  Systemic Diam: 2.00 cm MV A velocity: 82.30 cm/s MV E/A ratio:  1.03 Mihai Croitoru MD Electronically signed by Thurmon Fair MD Signature Date/Time: 07/16/2022/12:32:57 PM    Final     Scheduled Meds:  aspirin EC  81 mg Oral Daily   atorvastatin  80 mg Oral Daily   clopidogrel  75 mg Oral Daily   insulin aspart  0-9 Units Subcutaneous TID AC & HS   Continuous Infusions:  sodium chloride 75 mL/hr at 07/18/22 0355   heparin 900 Units/hr (07/18/22 0355)     LOS: 2 days   Time spent:  Azucena Fallen, DO [facility]  If 7PM-7AM, please contact night-coverage [email or web address]  07/18/2022, 7:22 AM

## 2022-07-18 NOTE — Anesthesia Preprocedure Evaluation (Signed)
Anesthesia Evaluation  Patient identified by MRN, date of birth, ID band Patient confused    Reviewed: Allergy & Precautions, NPO status , Patient's Chart, lab work & pertinent test results  History of Anesthesia Complications Negative for: history of anesthetic complications  Airway Mallampati: III  TM Distance: >3 FB Neck ROM: Full    Dental  (+) Partial Upper, Missing, Dental Advisory Given,    Pulmonary former smoker   breath sounds clear to auscultation       Cardiovascular hypertension, Pt. on medications (-) angina (-) Past MI and (-) CHF  Rhythm:Regular  1. Left ventricular ejection fraction, by estimation, is 60 to 65%. The  left ventricle has normal function. The left ventricle has no regional  wall motion abnormalities. Left ventricular diastolic parameters are  indeterminate.   2. Right ventricular systolic function is normal. The right ventricular  size is normal. Tricuspid regurgitation signal is inadequate for assessing  PA pressure.   3. Left atrial size was mildly dilated.   4. The mitral valve is normal in structure. Trivial mitral valve  regurgitation. No evidence of mitral stenosis.   5. The aortic valve is tricuspid. There is mild thickening of the aortic  valve. Aortic valve regurgitation is not visualized. Aortic valve  sclerosis is present, with no evidence of aortic valve stenosis.   6. The inferior vena cava is normal in size with greater than 50%  respiratory variability, suggesting right atrial pressure of 3 mmHg.     Neuro/Psych TIA Neuromuscular disease CVA, Residual Symptoms  negative psych ROS   GI/Hepatic negative GI ROS, Neg liver ROS,,,  Endo/Other  diabetes, Type 2    Renal/GU Renal diseaseLab Results      Component                Value               Date                      CREATININE               1.53 (H)            07/17/2022                Musculoskeletal   Abdominal    Peds  Hematology  (+) Blood dyscrasia, anemia Lab Results      Component                Value               Date                      CREATININE               1.53 (H)            07/17/2022              Anesthesia Other Findings   Reproductive/Obstetrics                              Anesthesia Physical Anesthesia Plan  ASA: 3  Anesthesia Plan: General   Post-op Pain Management: Minimal or no pain anticipated   Induction: Intravenous  PONV Risk Score and Plan: Ondansetron, Propofol infusion and TIVA  Airway Management Planned: Oral ETT  Additional Equipment: Arterial line  Intra-op Plan:   Post-operative Plan: Extubation in  OR and Possible Post-op intubation/ventilation  Informed Consent: I have reviewed the patients History and Physical, chart, labs and discussed the procedure including the risks, benefits and alternatives for the proposed anesthesia with the patient or authorized representative who has indicated his/her understanding and acceptance.     Dental advisory given  Plan Discussed with: CRNA  Anesthesia Plan Comments:          Anesthesia Quick Evaluation

## 2022-07-18 NOTE — Sedation Documentation (Signed)
Patient to be transported to PACU under the care of CRNA.

## 2022-07-18 NOTE — Anesthesia Procedure Notes (Signed)
Arterial Line Insertion Performed by: Samara Deist, CRNA, CRNA  Preanesthetic checklist: patient identified, IV checked, site marked, risks and benefits discussed, surgical consent, monitors and equipment checked, pre-op evaluation, timeout performed and anesthesia consent Lidocaine 1% used for infiltration Left, radial was placed Catheter size: 20 G Hand hygiene performed , maximum sterile barriers used  and Seldinger technique used Allen's test indicative of satisfactory collateral circulation Attempts: 1 Procedure performed without using ultrasound guided technique. Following insertion, dressing applied and Biopatch. Patient tolerated the procedure well with no immediate complications.

## 2022-07-18 NOTE — Progress Notes (Signed)
Patient arrived to unit cuff BP 168/85 and A-line BP 220/67.  BP parameters clarified with Dr. Roda Shutters. Goal SBP 130-160 via cuff. May DC A-line.    6:38 PM  Clevi titrated as appropriate, current BP 158/64 HR 85

## 2022-07-18 NOTE — Progress Notes (Signed)
ANTICOAGULATION CONSULT NOTE - Initial Consult  Pharmacy Consult for heparin Indication: stroke  No Known Allergies  Patient Measurements: Height: 5\' 9"  (175.3 cm) (per pt) Weight: 81.3 kg (179 lb 3.7 oz) IBW/kg (Calculated) : 70.7 Heparin Dosing Weight: 81kg  Vital Signs: Temp: 99.3 F (37.4 C) (11/30 0808) Temp Source: Oral (11/30 0342) BP: 198/82 (11/30 0808) Pulse Rate: 64 (11/30 0808)  Labs: Recent Labs    07/15/22 1040 07/15/22 1442 07/16/22 0510 07/17/22 0656 07/17/22 1822 07/18/22 0746  HGB 13.6  --  13.2 13.2  --  12.5*  HCT 42.4  --  40.6 40.4  --  38.1*  PLT 313  --  313 308  --  297  APTT 28  --   --   --   --   --   LABPROT 13.4  --   --   --   --   --   INR 1.0  --   --   --   --   --   HEPARINUNFRC  --   --   --  0.98* 0.57 0.55  CREATININE 1.64*  --  1.60* 1.53*  --   --   TROPONINIHS 6 8  --   --   --   --      Estimated Creatinine Clearance: 44.9 mL/min (A) (by C-G formula based on SCr of 1.53 mg/dL (H)).   Medical History: Past Medical History:  Diagnosis Date   CVA (cerebral infarction)    Diabetes mellitus without complication (HCC)    Gout    Hyperlipidemia    Hypertension    Memory loss    Stroke [facility])    Wears dentures    partial upper    Medications:  Medications Prior to Admission  Medication Sig Dispense Refill Last Dose   amLODipine (NORVASC) 5 MG tablet Take 5 mg by mouth daily.   07/15/2022   aspirin EC 81 MG tablet Take 81 mg by mouth daily.   07/15/2022   atorvastatin (LIPITOR) 10 MG tablet Take 10 mg by mouth daily.   07/15/2022   clonazePAM (KLONOPIN) 0.5 MG tablet Take 0.5 mg by mouth at bedtime as needed for anxiety.   Past Week   [facility] VITAMIN B12 1000 MCG tablet Take 1,000 mcg by mouth daily.   07/15/2022   cyanocobalamin 1000 MCG tablet Take 1 tablet by mouth daily.   07/15/2022   donepezil (ARICEPT) 10 MG tablet Take 10 mg by mouth at bedtime.   Past Week   glipiZIDE (GLUCOTROL XL) 5 MG 24 hr tablet Take 5 mg by  mouth daily with breakfast.   07/15/2022   lisinopril (ZESTRIL) 40 MG tablet Take 40 mg by mouth daily.   07/15/2022   memantine (NAMENDA) 10 MG tablet Take 10 mg by mouth 2 (two) times daily.   07/15/2022   metFORMIN (GLUCOPHAGE-XR) 500 MG 24 hr tablet Take 500 mg by mouth 2 (two) times daily with a meal.   07/15/2022   famotidine (PEPCID) 20 MG tablet Take 1 tablet (20 mg total) by mouth 2 (two) times daily. (Patient not taking: Reported on 07/15/2022) 60 tablet 0 Not Taking   ondansetron (ZOFRAN ODT) 4 MG disintegrating tablet Take 1 tablet (4 mg total) by mouth every 8 (eight) hours as needed for nausea or vomiting. (Patient not taking: Reported on 07/16/2022) 20 tablet 0 Not Taking   Scheduled:   aspirin EC  81 mg Oral Daily   atorvastatin  80 mg Oral Daily  clopidogrel  75 mg Oral Daily   clopidogrel  75 mg Oral Daily   insulin aspart  0-9 Units Subcutaneous TID AC & HS   Infusions:   sodium chloride 75 mL/hr at 07/18/22 0355   heparin 900 Units/hr (07/18/22 0355)    Assessment: Pt presented to [facility] with CVA. Tx here for cerebral arteriogram with plan to do angioplasty/stenting this Thursday. Heparin for anticoagulation.   Goal of Therapy:  Heparin level 0.3-0.5 units/ml Monitor platelets by anticoagulation protocol: Yes  Heparin level came back slightly supratherapeutic. No bleeding noted. Plan to hold at 10AM for angioplasty/stenting.  Plan:  Decrease heparin drip slightly to 850units/hr - stop at 10 AM Follow-up [facility] plans post angiography/stent Monitor daily heparin level, CBC  Rexford Maus, PharmD, BCPS 07/18/2022 8:55 AM

## 2022-07-18 NOTE — TOC Progression Note (Signed)
Transition of Care [facility]) - Progression Note    Patient Details  Name: Mitchell Washington MRN: [id number] Date of Birth: 11/02/1950  Transition of Care [facility]) CM/SW Contact  Kermit Balo, RN Phone Number: 07/18/2022, 9:51 AM  Clinical Narrative:    Pt having stent placed today. Will need therapy evals post stent for discharge disposition.  TOC following.        Expected Discharge Plan and Services                                                 Social Determinants of Health (SDOH) Interventions    Readmission Risk Interventions     No data to display

## 2022-07-18 NOTE — Transfer of Care (Signed)
Immediate Anesthesia Transfer of Care Note  Patient: Mitchell Washington  Procedure(s) Performed: Cerebral angioplasty with possible stenting  Patient Location: PACU  Anesthesia Type:General  Level of Consciousness: awake and alert   Airway & Oxygen Therapy: Patient Spontanous Breathing and Patient connected to face mask oxygen  Post-op Assessment: Report given to RN, Post -op Vital signs reviewed and stable, and on cleviprex infusion.  Movement of extremities on right side, left sided weakness remains.  Post vital signs: Reviewed and stable  Last Vitals:  Vitals Value Taken Time  BP 140/61 07/18/22 1624  Temp    Pulse 67 07/18/22 1627  Resp 20 07/18/22 1627  SpO2 98 % 07/18/22 1627  Vitals shown include unvalidated device data.  Last Pain:  Vitals:   07/18/22 1025  TempSrc: Oral  PainSc:          Complications: No notable events documented.

## 2022-07-18 NOTE — Progress Notes (Signed)
OT Cancellation Note  Patient Details Name: Mitchell Washington MRN: [id number] DOB: 06/10/1951   Cancelled Treatment:    Reason Eval/Treat Not Completed: Patient at procedure or test/ unavailable. Will return as schedule allows.   Tyler Deis, OTR/L [facility] Acute Rehabilitation Office: [phone]   Myrla Halsted 07/18/2022, 12:59 PM

## 2022-07-19 ENCOUNTER — Inpatient Hospital Stay (HOSPITAL_COMMUNITY): Payer: Medicare HMO

## 2022-07-19 ENCOUNTER — Encounter (HOSPITAL_COMMUNITY): Payer: Self-pay | Admitting: Interventional Radiology

## 2022-07-19 ENCOUNTER — Other Ambulatory Visit (HOSPITAL_COMMUNITY): Payer: Medicare HMO

## 2022-07-19 DIAGNOSIS — I6350 Cerebral infarction due to unspecified occlusion or stenosis of unspecified cerebral artery: Secondary | ICD-10-CM | POA: Diagnosis not present

## 2022-07-19 DIAGNOSIS — I639 Cerebral infarction, unspecified: Secondary | ICD-10-CM | POA: Diagnosis not present

## 2022-07-19 DIAGNOSIS — I651 Occlusion and stenosis of basilar artery: Secondary | ICD-10-CM | POA: Diagnosis not present

## 2022-07-19 LAB — BASIC METABOLIC PANEL
Anion gap: 4 — ABNORMAL LOW (ref 5–15)
BUN: 14 mg/dL (ref 8–23)
CO2: 25 mmol/L (ref 22–32)
Calcium: 8.5 mg/dL — ABNORMAL LOW (ref 8.9–10.3)
Chloride: 111 mmol/L (ref 98–111)
Creatinine, Ser: 1.49 mg/dL — ABNORMAL HIGH (ref 0.61–1.24)
GFR, Estimated: 50 mL/min — ABNORMAL LOW (ref >60)
Glucose, Bld: 154 mg/dL — ABNORMAL HIGH (ref 70–99)
Potassium: 4 mmol/L (ref 3.5–5.1)
Sodium: 140 mmol/L (ref 135–145)

## 2022-07-19 LAB — CBC
HCT: 34.8 % — ABNORMAL LOW (ref 39.0–52.0)
Hemoglobin: 11.8 g/dL — ABNORMAL LOW (ref 13.0–17.0)
MCH: 27.3 pg (ref 26.0–34.0)
MCHC: 33.9 g/dL (ref 30.0–36.0)
MCV: 80.6 fL (ref 80.0–100.0)
Platelets: 276 10*3/uL (ref 150–400)
RBC: 4.32 MIL/uL (ref 4.22–5.81)
RDW: 13 % (ref 11.5–15.5)
WBC: 10.5 10*3/uL (ref 4.0–10.5)
nRBC: 0 % (ref 0.0–0.2)

## 2022-07-19 LAB — GLUCOSE, CAPILLARY
Glucose-Capillary: 136 mg/dL — ABNORMAL HIGH (ref 70–99)
Glucose-Capillary: 124 mg/dL — ABNORMAL HIGH (ref 70–99)
Glucose-Capillary: 164 mg/dL — ABNORMAL HIGH (ref 70–99)
Glucose-Capillary: 149 mg/dL — ABNORMAL HIGH (ref 70–99)
Glucose-Capillary: 158 mg/dL — ABNORMAL HIGH (ref 70–99)

## 2022-07-19 MED ORDER — AMLODIPINE BESYLATE 5 MG PO TABS
5.0000 mg | ORAL_TABLET | Freq: Every day | ORAL | Status: DC
  Administered 2022-07-19: 5 mg via ORAL
  Filled 2022-07-19: qty 1

## 2022-07-19 MED ORDER — HEPARIN SODIUM (PORCINE) 5000 UNIT/ML IJ SOLN
5000.0000 [IU] | Freq: Three times a day (TID) | INTRAMUSCULAR | Status: DC
  Administered 2022-07-19 – 2022-07-24 (×14): 5000 [IU] via SUBCUTANEOUS
  Filled 2022-07-19 (×14): qty 1

## 2022-07-19 MED ORDER — CLEVIDIPINE BUTYRATE 0.5 MG/ML IV EMUL: 0.0000 mg/h | INTRAVENOUS | Status: AC

## 2022-07-19 MED ORDER — INSULIN ASPART 100 UNIT/ML IJ SOLN
0.0000 [IU] | Freq: Three times a day (TID) | INTRAMUSCULAR | Status: DC
  Administered 2022-07-19: 2 [IU] via SUBCUTANEOUS
  Administered 2022-07-19 – 2022-07-20 (×2): 1 [IU] via SUBCUTANEOUS
  Administered 2022-07-20: 2 [IU] via SUBCUTANEOUS
  Administered 2022-07-20: 1 [IU] via SUBCUTANEOUS
  Administered 2022-07-21: 3 [IU] via SUBCUTANEOUS
  Administered 2022-07-21: 2 [IU] via SUBCUTANEOUS
  Administered 2022-07-21: 7 [IU] via SUBCUTANEOUS
  Administered 2022-07-22 (×2): 2 [IU] via SUBCUTANEOUS
  Administered 2022-07-22: 5 [IU] via SUBCUTANEOUS
  Administered 2022-07-23 (×2): 2 [IU] via SUBCUTANEOUS
  Administered 2022-07-23: 5 [IU] via SUBCUTANEOUS
  Administered 2022-07-24: 2 [IU] via SUBCUTANEOUS

## 2022-07-19 MED ORDER — VITAMIN B-12 1000 MCG PO TABS
1000.0000 ug | ORAL_TABLET | Freq: Every day | ORAL | Status: DC
  Administered 2022-07-19 – 2022-07-24 (×6): 1000 ug via ORAL
  Filled 2022-07-19 (×6): qty 1

## 2022-07-19 MED ORDER — MEMANTINE HCL 10 MG PO TABS
10.0000 mg | ORAL_TABLET | Freq: Two times a day (BID) | ORAL | Status: DC
  Administered 2022-07-19 – 2022-07-24 (×11): 10 mg via ORAL
  Filled 2022-07-19 (×12): qty 1

## 2022-07-19 MED ORDER — DONEPEZIL HCL 10 MG PO TABS
10.0000 mg | ORAL_TABLET | Freq: Every day | ORAL | Status: DC
  Administered 2022-07-19 – 2022-07-23 (×5): 10 mg via ORAL
  Filled 2022-07-19 (×5): qty 1

## 2022-07-19 MED ORDER — AMLODIPINE BESYLATE 5 MG PO TABS
5.0000 mg | ORAL_TABLET | Freq: Once | ORAL | Status: AC
  Administered 2022-07-19: 5 mg via ORAL
  Filled 2022-07-19: qty 1

## 2022-07-19 MED ORDER — AMLODIPINE BESYLATE 10 MG PO TABS
10.0000 mg | ORAL_TABLET | Freq: Every day | ORAL | Status: DC
  Administered 2022-07-20 – 2022-07-24 (×5): 10 mg via ORAL
  Filled 2022-07-19 (×5): qty 1

## 2022-07-19 NOTE — Progress Notes (Cosign Needed Addendum)
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 S/p  balloon angioplasty x 2 at both levels with  persistent improved caliber to 60%.   Subjective:  Patient states that he is feeling better. Wife is at bedside states that his speech is improving and patient is more alert and interactive.  Allergies: Patient has no known allergies.  Medications: Prior to Admission medications   Medication Sig Start Date End Date Taking? Authorizing Provider  amLODipine (NORVASC) 5 MG tablet Take 5 mg by mouth daily. 01/10/20  Yes [provider]  aspirin EC 81 MG tablet Take 81 mg by mouth daily.   Yes [provider]  atorvastatin (LIPITOR) 10 MG tablet Take 10 mg by mouth daily. 07/06/18  Yes [provider]  clonazePAM (KLONOPIN) 0.5 MG tablet Take 0.5 mg by mouth at bedtime as needed for anxiety. 03/17/22  Yes [provider]  [facility] VITAMIN B12 1000 MCG tablet Take 1,000 mcg by mouth daily. 09/21/18  Yes [provider]  cyanocobalamin 1000 MCG tablet Take 1 tablet by mouth daily. 08/22/20  Yes [provider]  donepezil (ARICEPT) 10 MG tablet Take 10 mg by mouth at bedtime. 08/27/18  Yes [provider]  glipiZIDE (GLUCOTROL XL) 5 MG 24 hr tablet Take 5 mg by mouth daily with breakfast. 01/03/20 07/16/22 Yes [provider]  lisinopril (ZESTRIL) 40 MG tablet Take 40 mg by mouth daily. 09/23/21  Yes [provider]  memantine (NAMENDA) 10 MG tablet Take 10 mg by mouth 2 (two) times daily. 12/20/19  Yes [provider]  metFORMIN (GLUCOPHAGE-XR) 500 MG 24 hr tablet Take 500 mg by mouth 2 (two) times daily with a meal. 01/15/18 07/16/22 Yes [provider]  famotidine (PEPCID) 20 MG tablet Take 1 tablet (20 mg total) by mouth 2 (two) times  daily. Patient not taking: Reported on 07/15/2022 01/21/21   Sharman Cheek, MD  ondansetron (ZOFRAN ODT) 4 MG disintegrating tablet Take 1 tablet (4 mg total) by mouth every 8 (eight) hours as needed for nausea or vomiting. Patient not taking: Reported on 07/16/2022 01/21/21   Sharman Cheek, MD     Vital Signs: BP (!) 146/62   Pulse 63   Temp 98.8 F (37.1 C) (Oral)   Resp (!) 24   Ht 5\' 9"  (1.753 m) Comment: per pt  Wt 179 lb 3.7 oz (81.3 kg)   SpO2 96%   BMI 26.47 kg/m   Physical Exam Vitals and nursing note reviewed.  Constitutional:      Appearance: He is well-developed.  HENT:     Head: Normocephalic.  Cardiovascular:     Rate and Rhythm: Normal rate and regular rhythm.     Comments: Right  groin access site is soft with no active bleeding and no appreciable pseudoaneurysm. Dressing is C/D/I   Pulmonary:     Effort: Pulmonary effort is normal.  Genitourinary:    Comments: Serous fluid noted inside the foley catheter no clots  Musculoskeletal:        General: Normal range of motion.     Cervical back: Normal range of motion.  Skin:    General: Skin is dry.  Neurological:     Mental Status: He is alert and oriented to person, place, and time.     Comments: Alert, aware and oriented X 3 Speech slurred. Comprehension is  intact.  PERRL bilaterally Left facial droop noted Tongue midline Can spontaneously move all 4 extremities. Left upper arm with drift. Able to move left leg Fine motor and coordination intact.  Distal pulses (DP's) palpable bilaterally with Doppler Speech, cognition and language are generally intact.  Judgment and insight normal  Gait not assessed Romberg not assessed Heel to toe not assessed Distal pulses not assessed      Imaging: MR ANGIO HEAD WO CONTRAST  Result Date: 07/19/2022 CLINICAL DATA:  Stroke follow-up EXAM: MRI HEAD WITHOUT CONTRAST MRA HEAD WITHOUT CONTRAST TECHNIQUE: Multiplanar, multi-echo pulse sequences of the  brain and surrounding structures were acquired without intravenous contrast. Angiographic images of the Circle of Willis were acquired using MRA technique without intravenous contrast. 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 These are all small. 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 There is multifocal hyperintense T2-weighted signal within the white matter. Generalized volume loss. 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 The midline structures are normal. Vascular: Major flow voids are preserved. Skull and upper cervical spine: Normal calvarium and skull base. Visualized upper cervical spine and soft tissues are normal. Sinuses/Orbits:No paranasal sinus fluid levels or advanced mucosal thickening. No mastoid or middle ear effusion. Normal orbits. MRA HEAD FINDINGS POSTERIOR CIRCULATION: --Vertebral arteries: Normal --Inferior cerebellar arteries: Normal. --Basilar artery: Normal. --Superior cerebellar arteries: Normal. --Posterior cerebral arteries: Unchanged bilateral P1 occlusion. ANTERIOR CIRCULATION: --Intracranial internal carotid arteries: Normal. --Anterior cerebral arteries (ACA): Normal. Hypoplastic left A1 segment, normal variant --Middle cerebral arteries (MCA): Mild atherosclerotic irregularity of both M1 segments. ANATOMIC VARIANTS: None IMPRESSION: 1. Multiple new, small acute infarcts within both cerebellar hemispheres, right greater than left. 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 2. Unchanged bilateral P1 occlusions. No new intracranial occlusion. Electronically Signed   By: Ulyses Jarred M.D.   On: 07/19/2022 01:43   MR BRAIN WO CONTRAST  Result Date: 07/19/2022 CLINICAL DATA:  Stroke follow-up EXAM: MRI HEAD WITHOUT CONTRAST MRA HEAD WITHOUT CONTRAST TECHNIQUE:  Multiplanar, multi-echo pulse sequences of the brain and surrounding structures were acquired without intravenous contrast. Angiographic images of the Circle of Willis were acquired using MRA technique without intravenous contrast. 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 These are all small. 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 There is multifocal hyperintense T2-weighted signal within the white matter. Generalized volume loss. 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 The midline structures are normal. Vascular: Major flow voids are preserved. Skull and upper cervical spine: Normal calvarium and skull base. Visualized upper cervical spine and soft tissues are normal. Sinuses/Orbits:No paranasal sinus fluid levels or advanced mucosal thickening. No mastoid or middle ear effusion. Normal orbits. MRA HEAD FINDINGS POSTERIOR CIRCULATION: --Vertebral arteries: Normal --Inferior cerebellar arteries: Normal. --Basilar artery: Normal. --Superior cerebellar arteries: Normal. --Posterior cerebral arteries: Unchanged bilateral P1 occlusion. ANTERIOR CIRCULATION: --Intracranial internal carotid arteries: Normal. --Anterior cerebral arteries (ACA): Normal. Hypoplastic left A1 segment, normal variant --Middle cerebral arteries (MCA): Mild atherosclerotic irregularity of both M1 segments. ANATOMIC VARIANTS: None IMPRESSION: 1. Multiple new, small acute infarcts within both cerebellar hemispheres, right greater than left. 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 2. Unchanged bilateral P1 occlusions. No new intracranial occlusion. Electronically Signed  By: Ulyses Jarred M.D.   On: 07/19/2022 01:43   IR ANGIO INTRA EXTRACRAN SEL COM CAROTID INNOMINATE BILAT MOD SED  Result Date: 07/17/2022 CLINICAL DATA:   Severe vertebrobasilar ischemic symptoms of dizziness, gait imbalance, and lethargy. 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 EXAM: BILATERAL COMMON CAROTID AND INNOMINATE ANGIOGRAPHY COMPARISON:  CT angiogram of the head and neck July 15, 2022. MEDICATIONS: Heparin 1000 units IV. No antibiotic was administered within 1 hour of the procedure. ANESTHESIA/SEDATION: Versed 1 mg IV; Fentanyl 25 mcg IV Moderate Sedation Time:  27 minutes The patient was continuously monitored during the procedure by the interventional radiology nurse under my direct supervision. CONTRAST:  Omnipaque 300 approximately 65 mL. FLUOROSCOPY TIME:  Fluoroscopy Time: 8 minutes 0 seconds (1080 mGy). COMPLICATIONS: None immediate. TECHNIQUE: Informed written consent was obtained from the patient after a thorough discussion of the procedural risks, benefits and alternatives. All questions were addressed. Maximal Sterile Barrier Technique was utilized including caps, mask, sterile gowns, sterile gloves, sterile drape, hand hygiene and skin antiseptic. A timeout was performed prior to the initiation of the procedure. The right groin was prepped and draped in the usual sterile fashion. Thereafter using modified Seldinger technique, transfemoral access into the right common femoral artery was obtained without difficulty. Over an 0.035 inch guidewire, a 5 French Pinnacle sheath was inserted. Through this, and also over an 0.035 inch guidewire, a 5 [place] JB 1 catheter was advanced to the aortic arch region and selectively positioned in the right common carotid artery, the right vertebral artery, the left common carotid artery and the left vertebral artery. FINDINGS: The innominate arteriogram demonstrates the right subclavian artery proximally in the right common carotid artery to be patent proximally. The non dominant right vertebral artery has a 30% stenosis at its origin. More distally, the  vessel is seen to ascend to the cranial skull base. Patency is seen of the right vertebrobasilar junction with a mild-to-moderate tapered stenosis of the distal right vertebrobasilar junction. Severe decrease in caliber is seen of the proximal basilar artery, and in the mid basilar artery. Contrast is noted in the distal basilar artery, the superior cerebellar artery and transiently the anterior cerebral arteries proximally. The right common carotid arteriogram demonstrates the right external carotid artery and its major branches to be widely patent. The right internal carotid artery stenosis proximally with a small ulcerated plaque along the medial wall of the bulb. More distally, the right internal carotid artery opacifies normally to the cranial skull base. The petrous and the cavernous segments are widely patent. There is a 50-60% stenosis of the right internal carotid artery supraclinoid segment. The right middle cerebral artery demonstrates a 50-60% stenosis in the mid M1 segment with flow noted into the distal distribution. The right anterior cerebral artery opacifies into the capillary and venous phases with prompt cross-filling via the anterior communicating artery of the left anterior cerebral artery A2 segment and distally. The left common carotid arteriogram demonstrates the left external carotid artery and its major branches to be widely patent. The left internal carotid artery at the bulb has a smooth shallow plaque along the posterior wall of the bulb with no significant stenosis by the NASCET criteria. No acute ulcerations are identifiable. More distally, the left internal carotid artery opacifies to the cranial skull base. The petrous and the proximal cavernous segments are widely patent. There is a mild stenosis of the distal cavernous segment with the supraclinoid  segment demonstrating near normal patency. The left middle cerebral artery has a mild stenosis in its proximal M1 segment. More  distally, the trifurcation branches opacify normally. The left anterior cerebral artery proximal A1 segment demonstrates moderate to severe stenosis with flow noted more distally into the A2 segment. The dominant left vertebral artery has approximately 50% stenosis at its origin. More distally, the vessel is seen to opacify to the cranial skull base. Wide patency is seen of the left vertebrobasilar junction at the level of the left posterior-inferior cerebellar artery. Severe stenosis is seen of the distal left vertebrobasilar junction extending into the proximal basilar artery. Patency is seen of the superior cerebellar arteries and the anterior-inferior cerebellar arteries. Retrograde opacification is seen of the right vertebrobasilar junction with focal significant stenosis just proximal to the basilar artery. 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 Approximately 50% stenosis of the dominant left vertebral artery at its origin. Approximately 30% stenosis of the non dominant right vertebral artery at its origin, with a moderate to severe stenosis of the distal right vertebrobasilar junction. Approximately 50%-60% stenosis of the right internal carotid artery supraclinoid segment. Approximately 50-60% stenosis of the right middle cerebral artery mid M1 segment. 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 Electronically Signed   By: Luanne Bras M.D.   On: 07/17/2022 08:05   IR ANGIO VERTEBRAL SEL VERTEBRAL UNI L MOD SED  Result Date: 07/17/2022 CLINICAL DATA:  Severe vertebrobasilar ischemic symptoms of dizziness, gait imbalance, and lethargy. Severely stenotic mid basilar and proximal basilar  artery on CT angiogram of the head and neck. EXAM: BILATERAL COMMON CAROTID AND INNOMINATE ANGIOGRAPHY COMPARISON:  CT angiogram of the head and neck July 15, 2022. MEDICATIONS: Heparin 1000 units IV. No antibiotic was administered within 1 hour of the procedure. ANESTHESIA/SEDATION: Versed 1 mg IV; Fentanyl 25 mcg IV Moderate Sedation Time:  27 minutes The patient was continuously monitored during the procedure by the interventional radiology nurse under my direct supervision. CONTRAST:  Omnipaque 300 approximately 65 mL. FLUOROSCOPY TIME:  Fluoroscopy Time: 8 minutes 0 seconds (1080 mGy). COMPLICATIONS: None immediate. TECHNIQUE: Informed written consent was obtained from the patient after a thorough discussion of the procedural risks, benefits and alternatives. All questions were addressed. Maximal Sterile Barrier Technique was utilized including caps, mask, sterile gowns, sterile gloves, sterile drape, hand hygiene and skin antiseptic. A timeout was performed prior to the initiation of the procedure. The right groin was prepped and draped in the usual sterile fashion. Thereafter using modified Seldinger technique, transfemoral access into the right common femoral artery was obtained without difficulty. Over an 0.035 inch guidewire, a 5 French Pinnacle sheath was inserted. Through this, and also over an 0.035 inch guidewire, a 5 [place] JB 1 catheter was advanced to the aortic arch region and selectively positioned in the right common carotid artery, the right vertebral artery, the left common carotid artery and the left vertebral artery. FINDINGS: The innominate arteriogram demonstrates the right subclavian artery proximally in the right common carotid artery to be patent proximally. The non dominant right vertebral artery has a 30% stenosis at its origin. More distally, the vessel is seen to ascend to the cranial skull base. Patency is seen of the right vertebrobasilar junction with  a mild-to-moderate  tapered stenosis of the distal right vertebrobasilar junction. Severe decrease in caliber is seen of the proximal basilar artery, and in the mid basilar artery. Contrast is noted in the distal basilar artery, the superior cerebellar artery and transiently the anterior cerebral arteries proximally. The right common carotid arteriogram demonstrates the right external carotid artery and its major branches to be widely patent. The right internal carotid artery stenosis proximally with a small ulcerated plaque along the medial wall of the bulb. More distally, the right internal carotid artery opacifies normally to the cranial skull base. The petrous and the cavernous segments are widely patent. There is a 50-60% stenosis of the right internal carotid artery supraclinoid segment. The right middle cerebral artery demonstrates a 50-60% stenosis in the mid M1 segment with flow noted into the distal distribution. The right anterior cerebral artery opacifies into the capillary and venous phases with prompt cross-filling via the anterior communicating artery of the left anterior cerebral artery A2 segment and distally. The left common carotid arteriogram demonstrates the left external carotid artery and its major branches to be widely patent. The left internal carotid artery at the bulb has a smooth shallow plaque along the posterior wall of the bulb with no significant stenosis by the NASCET criteria. No acute ulcerations are identifiable. More distally, the left internal carotid artery opacifies to the cranial skull base. The petrous and the proximal cavernous segments are widely patent. There is a mild stenosis of the distal cavernous segment with the supraclinoid segment demonstrating near normal patency. The left middle cerebral artery has a mild stenosis in its proximal M1 segment. More distally, the trifurcation branches opacify normally. The left anterior cerebral artery proximal A1 segment demonstrates moderate to  severe stenosis with flow noted more distally into the A2 segment. The dominant left vertebral artery has approximately 50% stenosis at its origin. More distally, the vessel is seen to opacify to the cranial skull base. Wide patency is seen of the left vertebrobasilar junction at the level of the left posterior-inferior cerebellar artery. Severe stenosis is seen of the distal left vertebrobasilar junction extending into the proximal basilar artery. Patency is seen of the superior cerebellar arteries and the anterior-inferior cerebellar arteries. Retrograde opacification is seen of the right vertebrobasilar junction with focal significant stenosis just proximal to the basilar artery. 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 Approximately 50% stenosis of the dominant left vertebral artery at its origin. Approximately 30% stenosis of the non dominant right vertebral artery at its origin, with a moderate to severe stenosis of the distal right vertebrobasilar junction. Approximately 50%-60% stenosis of the right internal carotid artery supraclinoid segment. Approximately 50-60% stenosis of the right middle cerebral artery mid M1 segment. 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 Electronically Signed   By: Luanne Bras M.D.   On: 07/17/2022 08:05   IR ANGIO VERTEBRAL SEL SUBCLAVIAN INNOMINATE UNI R MOD SED  Result Date: 07/17/2022 CLINICAL DATA:  Severe vertebrobasilar ischemic symptoms of dizziness, gait imbalance, and lethargy. 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 EXAM: BILATERAL COMMON CAROTID AND INNOMINATE ANGIOGRAPHY COMPARISON:  CT  angiogram of the head and neck July 15, 2022. MEDICATIONS: Heparin 1000  units IV. No antibiotic was administered within 1 hour of the procedure. ANESTHESIA/SEDATION: Versed 1 mg IV; Fentanyl 25 mcg IV Moderate Sedation Time:  27 minutes The patient was continuously monitored during the procedure by the interventional radiology nurse under my direct supervision. CONTRAST:  Omnipaque 300 approximately 65 mL. FLUOROSCOPY TIME:  Fluoroscopy Time: 8 minutes 0 seconds (1080 mGy). COMPLICATIONS: None immediate. TECHNIQUE: Informed written consent was obtained from the patient after a thorough discussion of the procedural risks, benefits and alternatives. All questions were addressed. Maximal Sterile Barrier Technique was utilized including caps, mask, sterile gowns, sterile gloves, sterile drape, hand hygiene and skin antiseptic. A timeout was performed prior to the initiation of the procedure. The right groin was prepped and draped in the usual sterile fashion. Thereafter using modified Seldinger technique, transfemoral access into the right common femoral artery was obtained without difficulty. Over an 0.035 inch guidewire, a 5 French Pinnacle sheath was inserted. Through this, and also over an 0.035 inch guidewire, a 5 [place] JB 1 catheter was advanced to the aortic arch region and selectively positioned in the right common carotid artery, the right vertebral artery, the left common carotid artery and the left vertebral artery. FINDINGS: The innominate arteriogram demonstrates the right subclavian artery proximally in the right common carotid artery to be patent proximally. The non dominant right vertebral artery has a 30% stenosis at its origin. More distally, the vessel is seen to ascend to the cranial skull base. Patency is seen of the right vertebrobasilar junction with a mild-to-moderate tapered stenosis of the distal right vertebrobasilar junction. Severe decrease in caliber is seen of the proximal basilar  artery, and in the mid basilar artery. Contrast is noted in the distal basilar artery, the superior cerebellar artery and transiently the anterior cerebral arteries proximally. The right common carotid arteriogram demonstrates the right external carotid artery and its major branches to be widely patent. The right internal carotid artery stenosis proximally with a small ulcerated plaque along the medial wall of the bulb. More distally, the right internal carotid artery opacifies normally to the cranial skull base. The petrous and the cavernous segments are widely patent. There is a 50-60% stenosis of the right internal carotid artery supraclinoid segment. The right middle cerebral artery demonstrates a 50-60% stenosis in the mid M1 segment with flow noted into the distal distribution. The right anterior cerebral artery opacifies into the capillary and venous phases with prompt cross-filling via the anterior communicating artery of the left anterior cerebral artery A2 segment and distally. The left common carotid arteriogram demonstrates the left external carotid artery and its major branches to be widely patent. The left internal carotid artery at the bulb has a smooth shallow plaque along the posterior wall of the bulb with no significant stenosis by the NASCET criteria. No acute ulcerations are identifiable. More distally, the left internal carotid artery opacifies to the cranial skull base. The petrous and the proximal cavernous segments are widely patent. There is a mild stenosis of the distal cavernous segment with the supraclinoid segment demonstrating near normal patency. The left middle cerebral artery has a mild stenosis in its proximal M1 segment. More distally, the trifurcation branches opacify normally. The left anterior cerebral artery proximal A1 segment demonstrates moderate to severe stenosis with flow noted more distally into the A2 segment. The dominant left vertebral artery has approximately 50%  stenosis at its origin. More distally, the vessel is seen to opacify to the cranial skull base. Wide patency is seen of  the left vertebrobasilar junction at the level of the left posterior-inferior cerebellar artery. Severe stenosis is seen of the distal left vertebrobasilar junction extending into the proximal basilar artery. Patency is seen of the superior cerebellar arteries and the anterior-inferior cerebellar arteries. Retrograde opacification is seen of the right vertebrobasilar junction with focal significant stenosis just proximal to the basilar artery. 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 Approximately 50% stenosis of the dominant left vertebral artery at its origin. Approximately 30% stenosis of the non dominant right vertebral artery at its origin, with a moderate to severe stenosis of the distal right vertebrobasilar junction. Approximately 50%-60% stenosis of the right internal carotid artery supraclinoid segment. Approximately 50-60% stenosis of the right middle cerebral artery mid M1 segment. 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 Electronically Signed   By: Luanne Bras M.D.   On: 07/17/2022 08:05   MR BRAIN WO CONTRAST  Result Date: 07/16/2022 CLINICAL DATA:  Stroke follow-up EXAM: MRI HEAD WITHOUT CONTRAST TECHNIQUE: Multiplanar, multiecho pulse sequences of the brain and surrounding structures were obtained without intravenous contrast. Four sequences were performed COMPARISON:  Head CT 07/15/2022 FINDINGS: There is a small acute infarct of the right paramedian ventral pons. No other area of acute ischemia. There are old bilateral occipital infarcts and an old left  lentiform nucleus infarct. There is multifocal periventricular white matter hyperintensity, most often a result of chronic microvascular ischemia. No acute hemorrhage. IMPRESSION: Small acute infarct of the right paramedian ventral pons. Electronically Signed   By: Ulyses Jarred M.D.   On: 07/16/2022 22:06   ECHOCARDIOGRAM COMPLETE  Result Date: 07/16/2022    ECHOCARDIOGRAM REPORT   Patient Name:   JAYDEEN CORCORAN Date of Exam: 07/16/2022 Medical Rec #:  [id number]        Height:       69.0 in Accession #:    [id number]       Weight:       179.2 lb Date of Birth:  07/29/1951         BSA:          1.972 m Patient Age:    71 years         BP:           151/71 mmHg Patient Gender: M                HR:           50 bpm. Exam Location:  Inpatient Procedure: 2D Echo, Cardiac Doppler and Color Doppler Indications:    TIA G45.9  History:        Patient has no prior history of Echocardiogram examinations.                 Stroke; Risk Factors:Hypertension, Dyslipidemia and Diabetes.  Sonographer:    Bernadene Person RDCS Referring Phys: [id number] [place]  1. Left ventricular ejection fraction, by estimation, is 60 to 65%. The left ventricle has normal function. The left ventricle has no regional wall motion abnormalities. Left ventricular diastolic parameters are indeterminate.  2. Right ventricular systolic function is normal. The right ventricular size is normal. Tricuspid regurgitation signal is inadequate for assessing PA pressure.  3. Left atrial size was mildly  dilated.  4. The mitral valve is normal in structure. Trivial mitral valve regurgitation. No evidence of mitral stenosis.  5. The aortic valve is tricuspid. There is mild thickening of the aortic valve. Aortic valve regurgitation is not visualized. Aortic valve sclerosis is present, with no evidence of aortic valve stenosis.  6. The inferior vena cava is normal in size with greater than 50% respiratory variability, suggesting right atrial  pressure of 3 mmHg. FINDINGS  Left Ventricle: Left ventricular ejection fraction, by estimation, is 60 to 65%. The left ventricle has normal function. The left ventricle has no regional wall motion abnormalities. The left ventricular internal cavity size was normal in size. There is  no left ventricular hypertrophy. Left ventricular diastolic parameters are indeterminate. Right Ventricle: The right ventricular size is normal. No increase in right ventricular wall thickness. Right ventricular systolic function is normal. Tricuspid regurgitation signal is inadequate for assessing PA pressure. Left Atrium: Left atrial size was mildly dilated. Right Atrium: Right atrial size was normal in size. Pericardium: There is no evidence of pericardial effusion. Mitral Valve: The mitral valve is normal in structure. Mild mitral annular calcification. Trivial mitral valve regurgitation. No evidence of mitral valve stenosis. Tricuspid Valve: The tricuspid valve is normal in structure. Tricuspid valve regurgitation is not demonstrated. Aortic Valve: The aortic valve is tricuspid. There is mild thickening of the aortic valve. Aortic valve regurgitation is not visualized. Aortic valve sclerosis is present, with no evidence of aortic valve stenosis. Pulmonic Valve: The pulmonic valve was grossly normal. Pulmonic valve regurgitation is not visualized. Aorta: The aortic root and ascending aorta are structurally normal, with no evidence of dilitation. Venous: The inferior vena cava is normal in size with greater than 50% respiratory variability, suggesting right atrial pressure of 3 mmHg. IAS/Shunts: No atrial level shunt detected by color flow Doppler.  LEFT VENTRICLE PLAX 2D LVIDd:         4.50 cm     Diastology LVIDs:         2.60 cm     LV e' medial:    5.76 cm/s LV PW:         0.90 cm     LV E/e' medial:  14.7 LV IVS:        0.90 cm     LV e' lateral:   8.43 cm/s LVOT diam:     2.00 cm     LV E/e' lateral: 10.1 LV SV:         69 LV  SV Index:   35 LVOT Area:     3.14 cm  LV Volumes (MOD) LV vol d, MOD A2C: 76.2 ml LV vol d, MOD A4C: 93.5 ml LV vol s, MOD A2C: 28.6 ml LV vol s, MOD A4C: 36.4 ml LV SV MOD A2C:     47.6 ml LV SV MOD A4C:     93.5 ml LV SV MOD BP:      52.2 ml RIGHT VENTRICLE RV S prime:     11.60 cm/s TAPSE (M-mode): 1.9 cm LEFT ATRIUM             Index        RIGHT ATRIUM           Index LA diam:        3.90 cm 1.98 cm/m   RA Area:     12.70 cm LA Vol (A2C):   26.7 ml 13.54 ml/m  RA Volume:   24.60 ml  12.47 ml/m LA  Vol (A4C):   29.2 ml 14.81 ml/m LA Biplane Vol: 27.9 ml 14.15 ml/m  AORTIC VALVE LVOT Vmax:   88.80 cm/s LVOT Vmean:  55.300 cm/s LVOT VTI:    0.220 m  AORTA Ao Root diam: 2.90 cm Ao Asc diam:  3.30 cm MITRAL VALVE MV Area (PHT): 2.87 cm    SHUNTS MV Decel Time: 264 msec    Systemic VTI:  0.22 m MV E velocity: 84.90 cm/s  Systemic Diam: 2.00 cm MV A velocity: 82.30 cm/s MV E/A ratio:  1.03 Mihai Croitoru MD Electronically signed by Sanda Klein MD Signature Date/Time: 07/16/2022/12:32:57 PM    Final    MR BRAIN WO CONTRAST  Result Date: 07/15/2022 CLINICAL DATA:  Neuro deficit, acute, stroke suspected. Gait disturbance. Headache. Dementia. EXAM: MRI HEAD WITHOUT CONTRAST TECHNIQUE: Multiplanar, multiecho pulse sequences of the brain and surrounding structures were obtained without intravenous contrast. COMPARISON:  CT studies earlier same day FINDINGS: Brain: Diffusion imaging does not show any acute or subacute infarction. No focal abnormality affects the brainstem. There are numerous old small vessel cerebellar infarctions. Cerebral hemispheres show old infarctions in the PCA territories more extensive on the left than the right with encephalomalacia and gliosis. There is old infarction in the left basal ganglia and external capsule region. Mild chronic small-vessel ischemic changes seen elsewhere within the hemispheric white matter. Hemosiderin deposition is present in association with the old left  basal ganglia/external capsule infarction. No mass lesion, recent hemorrhage, hydrocephalus or extra-axial collection. Vascular: Major vessels at the base of the brain show flow. Skull and upper cervical spine: Negative Sinuses/Orbits: Small amount of mucoid fluid in the left division of the sphenoid sinus. Mild mucosal thickening elsewhere consistent with seasonal rhinitis. Other: None IMPRESSION: 1. No acute or reversible finding. 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 Mild chronic small-vessel ischemic changes elsewhere affecting the brain as outlined above. 2. Mild mucosal thickening and mucoid fluid in the left division of the sphenoid sinus consistent with seasonal rhinitis. Electronically Signed   By: Nelson Chimes M.D.   On: 07/15/2022 12:59   CT ANGIO HEAD NECK W WO CM W PERF (CODE STROKE)  Result Date: 07/15/2022 CLINICAL DATA:  Neuro deficit, acute, stroke suspected. Ataxia, slurred speech, left-sided weakness, and facial droop. EXAM: CT ANGIOGRAPHY HEAD AND NECK CT PERFUSION BRAIN TECHNIQUE: Multidetector CT imaging of the head and neck was performed using the standard protocol during bolus administration of intravenous contrast. Multiplanar CT image reconstructions and MIPs were obtained to evaluate the vascular anatomy. Carotid stenosis measurements (when applicable) are obtained utilizing NASCET criteria, using the distal internal carotid diameter as the denominator. Multiphase CT imaging of the brain was performed following IV bolus contrast injection. Subsequent parametric perfusion maps were calculated using RAPID software. RADIATION DOSE REDUCTION: This exam was performed according to the departmental dose-optimization program which includes automated exposure control, adjustment of the mA and/or kV according to patient size and/or use of iterative reconstruction technique. CONTRAST:  131mL OMNIPAQUE IOHEXOL  350 MG/ML SOLN COMPARISON:  None Available. FINDINGS: CTA NECK FINDINGS Aortic arch: Normal variant aortic arch branching pattern with common origin of the brachiocephalic and left common carotid arteries. Mild atherosclerosis without a significant stenosis of the arch vessel origins. Right carotid system: Patent with a small amount of calcified and soft plaque at the carotid bifurcation. No evidence of a significant stenosis or dissection. Left carotid system: Patent with a small amount of calcified and  soft plaque in the distal common carotid artery. No evidence of a significant stenosis or dissection. Vertebral arteries: Patent with the left being dominant. Plaque at the vertebral origins results in mild stenosis on the right and moderate stenosis on the left. Mild multifocal irregular narrowing of the right V2 segment. Skeleton: Mild cervical spondylosis. Other neck: No evidence of cervical lymphadenopathy or mass. Upper chest: Clear lung apices. Review of the MIP images confirms the above findings CTA HEAD FINDINGS Anterior circulation: The internal carotid arteries are patent from skull base to carotid termini with moderate to severe paraclinoid stenoses bilaterally. ACAs and MCAs are patent with moderate branch vessel atherosclerotic irregularity as well as severe irregular narrowing of the left A1 segment. No aneurysm is identified. Posterior circulation: The intracranial vertebral arteries are patent to the basilar with severe stenoses bilaterally. The basilar artery is patent with a severe stenosis of its proximal to midportion (immediately distal to the right AICA origin) and a moderate stenosis distally. Both PCAs are occluded at the P1 levels. There may be a small right posterior communicating artery, and there is some reconstitution of distal PCA branches primarily on the right. No aneurysm is identified. Venous sinuses: Patent. Anatomic variants: None. Review of the MIP images confirms the above  findings CT Brain Perfusion Findings: ASPECTS: 10 CBF (<30%) Volume: 0 mL Perfusion (Tmax>6.0s) volume: 24 mL, located in both occipital lobes (PCA territories) Mismatch Volume: 24 mL Infarction Location: No acute core infarct identified by CTP. The study was reviewed by telephone with Dr. Wilford Corner on 07/15/2022 at 11:10 a.m. IMPRESSION: 1. Bilateral proximal PCA occlusion. 2. 24 mL of reported penumbra in the occipital lobes which largely corresponds to chronic infarcts. 3. 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 4. Mild cervical carotid artery atherosclerosis without significant stenosis. 5. Moderate left and mild right vertebral artery origin stenoses. 6.  Aortic Atherosclerosis (ICD10-I70.0). Electronically Signed   By: Sebastian Ache M.D.   On: 07/15/2022 11:48   CT HEAD CODE STROKE WO CONTRAST  Result Date: 07/15/2022 CLINICAL DATA:  Code stroke. Neuro deficit, acute, stroke suspected. Ataxia, slurred speech, left-sided weakness, and facial droop. EXAM: CT HEAD WITHOUT CONTRAST TECHNIQUE: Contiguous axial images were obtained from the base of the skull through the vertex without intravenous contrast. RADIATION DOSE REDUCTION: This exam was performed according to the departmental dose-optimization program which includes automated exposure control, adjustment of the mA and/or kV according to patient size and/or use of iterative reconstruction technique. COMPARISON:  Head CT 01/21/2021 FINDINGS: Brain: There is no evidence of an acute infarct, intracranial hemorrhage, mass, midline shift, or extra-axial fluid collection. A chronic left basal ganglia infarct, small right and large left chronic PCA infarcts, and small chronic bilateral cerebellar infarcts were present in 2022. There is ex vacuo dilatation of the left lateral ventricle. Hypodensities elsewhere in the cerebral white matter are unchanged and nonspecific  but compatible with mild chronic small vessel ischemic disease. Vascular: Calcified atherosclerosis at the skull base. No hyperdense vessel. Skull: No acute fracture or suspicious osseous lesion. Sinuses/Orbits: Mild mucosal thickening in the paranasal sinuses. Clear mastoid air cells. Bilateral cataract extraction. Other: Mild left parietal scalp scarring. ASPECTS ([place] Stroke Program Early CT Score) (right MCA territory) - Ganglionic level infarction (caudate, lentiform nuclei, internal capsule, insula, M1-M3 cortex): 7 - Supraganglionic infarction (M4-M6 cortex): 3 Total score (0-10 with 10 being normal): 10 The study was reviewed by telephone with Dr. Wilford Corner on 07/15/2022  at 11:10 a.m. IMPRESSION: 1. No evidence of acute intracranial abnormality. ASPECTS of 10. 2. 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 Electronically Signed   By: Logan Bores M.D.   On: 07/15/2022 11:22   DG Chest Portable 1 View  Result Date: 07/15/2022 CLINICAL DATA:  Weakness EXAM: PORTABLE CHEST 1 VIEW COMPARISON:  Chest radiograph 02/20/2021 FINDINGS: The cardiomediastinal silhouette is normal. There is no focal consolidation or pulmonary edema. There is no pleural effusion or pneumothorax There is no acute osseous abnormality. IMPRESSION: No radiographic evidence of acute cardiopulmonary process. Electronically Signed   By: Valetta Mole M.D.   On: 07/15/2022 11:19    Labs:  CBC: Recent Labs    07/16/22 0510 07/17/22 0656 07/18/22 0746 07/19/22 0421  WBC 6.1 7.1 9.1 10.5  HGB 13.2 13.2 12.5* 11.8*  HCT 40.6 40.4 38.1* 34.8*  PLT 313 308 297 276    COAGS: Recent Labs    07/15/22 1040  INR 1.0  APTT 28    BMP: Recent Labs    07/15/22 1040 07/16/22 0510 07/17/22 0656 07/19/22 0421  NA 141 140 139 140  K 4.0 3.8 3.7 4.0  CL 106 105 105 111  CO2 25 25 24 25   GLUCOSE 127* 76 94 154*  BUN 22 18 12 14   CALCIUM 9.3 9.1 8.6* 8.5*  CREATININE 1.64* 1.60* 1.53*  1.49*  GFRNONAA 45* 46* 49* 50*    LIVER FUNCTION TESTS: Recent Labs    07/15/22 1040 07/16/22 0510  BILITOT 0.6 0.4  AST 20 13*  ALT 12 12  ALKPHOS 65 85  PROT 8.2* 7.2  ALBUMIN 4.2 3.6    Assessment and Plan:  71 y.o. male inpatient History of  Gout, DM, HLD, HTN, CVA ( no residual deficits (Presented to the ED at [facility] on 11.27.23  with generalized weakness and disequilibrium. CT showed significant posterior circulation infarcts with bilateral PCA occlusions. Patient was started on Plavix and ASA and transferred to [facility] for further evaluation and intervention. Cerebral arteriogram performed on 11.28.23 by Dr. 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 S/p  balloon angioplasty x 2 at both levels with  persistent improved caliber to 60%. MR brain from 12.1.23 read multiple new, small acute infarcts within both cerebellar hemispheres, right greater than left. 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 Unchanged bilateral P1 occlusions. No new intracranial occlusion.   Patient now extubated. Wife at bedside. Pulses able to be detected with doppler. Persistent but improving slurred speech. Left facial droop also improving. Patient is able to bend left knee. 3/5 Left arm with drift. 3/5. Right  groin access site is soft with no active bleeding and no appreciable pseudoaneurysm. Dressing is C/D/ISerous fluid noted to be in the foley catheter. No clots noted to be in the foley catheter  Patient to be seen in [facility]. Follow up order placed. IR scheduler will call with appointment date and time. Please call [phone] with any questions or concerns   Electronically Signed: Jacqualine Mau, NP 07/19/2022, 8:49 AM   I spent a total of 15 Minutes at the patient's bedside AND on the patient's hospital floor or unit, greater than 50% of which was counseling/coordinating care for re-occlusive stenosis of mid  basilar artery and left vertebral basilar junction.. S/p  balloon angioplasty x 2 at both levels with  persistent improved caliber to 60%.

## 2022-07-19 NOTE — Progress Notes (Addendum)
STROKE TEAM PROGRESS NOTE   INTERVAL HISTORY Patient is awake and alert, eating breakfast. Wife is at the bedside.  He is on asa and brilinta.   He is currently on 2mg  cleviprex. Will increase amlodipine to 10mg . After 24 hr will change BP goal < 180 with Long-term BP goal of 130-160  Awake, alert oriented to person, place, age, unable to name correct month. Dysarthric, follows simple commands, able to name objects. PERRL, left facial droop. LUE 3/5,  RUE 4/5, LLE 3/5  RLE 3/5 Sensation decreased  LUE, LLE, no ataxia    PT/OT/ST pending   Vitals:   07/19/22 0930 07/19/22 0945 07/19/22 1000 07/19/22 1015  BP: (!) 147/60 (!) 150/63 (!) 151/60 (!) 157/67  Pulse: 62 63 62 68  Resp: (!) 25 (!) 25 (!) 27 19  Temp:      TempSrc:      SpO2: 96% 96% 95% 96%  Weight:      Height:       CBC:  Recent Labs  Lab 07/15/22 1040 07/16/22 0510 07/18/22 0746 07/19/22 0421  WBC 6.0   < > 9.1 10.5  NEUTROABS 3.4  --   --   --   HGB 13.6   < > 12.5* 11.8*  HCT 42.4   < > 38.1* 34.8*  MCV 82.7   < > 81.9 80.6  PLT 313   < > 297 276   < > = values in this interval not displayed.    Basic Metabolic Panel:  Recent Labs  Lab 07/17/22 0656 07/19/22 0421  NA 139 140  K 3.7 4.0  CL 105 111  CO2 24 25  GLUCOSE 94 154*  BUN 12 14  CREATININE 1.53* 1.49*  CALCIUM 8.6* 8.5*    Lipid Panel:  Recent Labs  Lab 07/16/22 0510  CHOL 105  TRIG 80  HDL 31*  CHOLHDL 3.4  VLDL 16  LDLCALC 58    HgbA1c:  Recent Labs  Lab 07/16/22 0510  HGBA1C 6.5*    Urine Drug Screen:  Recent Labs  Lab 07/16/22 0709  LABOPIA NONE DETECTED  COCAINSCRNUR NONE DETECTED  LABBENZ NONE DETECTED  AMPHETMU NONE DETECTED  THCU NONE DETECTED  LABBARB NONE DETECTED     Alcohol Level  Recent Labs  Lab 07/15/22 1040  ETH <10    IMAGING past 24 hours MR ANGIO HEAD WO CONTRAST  Result Date: 07/19/2022 CLINICAL DATA:  Stroke follow-up EXAM: MRI HEAD WITHOUT CONTRAST MRA HEAD WITHOUT CONTRAST  TECHNIQUE: Multiplanar, multi-echo pulse sequences of the brain and surrounding structures were acquired without intravenous contrast. Angiographic images of the Circle of Willis were acquired using MRA technique without intravenous contrast. 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 These are all small. 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 There is multifocal hyperintense T2-weighted signal within the white matter. Generalized volume loss. 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 The midline structures are normal. Vascular: Major flow voids are preserved. Skull and upper cervical spine: Normal calvarium and skull base. Visualized upper cervical spine and soft tissues are normal. Sinuses/Orbits:No paranasal sinus fluid levels or advanced mucosal thickening. No mastoid or middle ear effusion. Normal orbits. MRA HEAD FINDINGS POSTERIOR CIRCULATION: --Vertebral arteries: Normal --Inferior cerebellar arteries: Normal. --Basilar artery: Normal. --Superior cerebellar arteries: Normal. --Posterior cerebral arteries: Unchanged bilateral P1 occlusion. ANTERIOR CIRCULATION: --Intracranial  internal carotid arteries: Normal. --Anterior cerebral arteries (ACA): Normal. Hypoplastic left A1 segment, normal variant --Middle cerebral arteries (MCA): Mild atherosclerotic irregularity of both M1 segments. ANATOMIC VARIANTS: None IMPRESSION: 1. Multiple new, small acute infarcts within both cerebellar hemispheres, right greater than left. 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 2. Unchanged bilateral P1 occlusions. No new intracranial occlusion. Electronically Signed   By: Deatra Robinson M.D.   On: 07/19/2022 01:43   MR BRAIN WO CONTRAST  Result Date: 07/19/2022 CLINICAL DATA:  Stroke follow-up EXAM: MRI HEAD  WITHOUT CONTRAST MRA HEAD WITHOUT CONTRAST TECHNIQUE: Multiplanar, multi-echo pulse sequences of the brain and surrounding structures were acquired without intravenous contrast. Angiographic images of the Circle of Willis were acquired using MRA technique without intravenous contrast. 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 These are all small. 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 There is multifocal hyperintense T2-weighted signal within the white matter. Generalized volume loss. 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 The midline structures are normal. Vascular: Major flow voids are preserved. Skull and upper cervical spine: Normal calvarium and skull base. Visualized upper cervical spine and soft tissues are normal. Sinuses/Orbits:No paranasal sinus fluid levels or advanced mucosal thickening. No mastoid or middle ear effusion. Normal orbits. MRA HEAD FINDINGS POSTERIOR CIRCULATION: --Vertebral arteries: Normal --Inferior cerebellar arteries: Normal. --Basilar artery: Normal. --Superior cerebellar arteries: Normal. --Posterior cerebral arteries: Unchanged bilateral P1 occlusion. ANTERIOR CIRCULATION: --Intracranial internal carotid arteries: Normal. --Anterior cerebral arteries (ACA): Normal. Hypoplastic left A1 segment, normal variant --Middle cerebral arteries (MCA): Mild atherosclerotic irregularity of both M1 segments. ANATOMIC VARIANTS: None IMPRESSION: 1. Multiple new, small acute infarcts within both cerebellar hemispheres, right greater than left. 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 2. Unchanged bilateral P1 occlusions. No new intracranial occlusion. Electronically Signed   By: Deatra Robinson M.D.   On: 07/19/2022 01:43    PHYSICAL EXAM General: Alert, well-nourished,  well-developed elderly patient in no acute distress Respiratory: Regular, unlabored respirations on room air  Neuro - awake, alert, eyes open, severe dysarthria with anarthria at times.  Did not answer orientation questions, however following all simple commands. No gaze palsy, tracking bilaterally, visual field full, PERRL.  Continued left facial droop. Tongue midline.  Right upper extremity at least 4/5 and right lower extremity at least 3/5.  Left upper extremity 3 -/5, left lower extremity 2/5.  Sensation symmetrical bilaterally subjectively the, right FTN intact, gait not tested.    ASSESSMENT/PLAN Mitchell Washington is a 71 y.o. male with history of stroke, hypertension, hyperlipidemia, memory loss, REM sleep behavior disorder, tremor and dementia presenting with  difficulty with ambulation and maintaining equilibrium which started on 07/14/2022.  He did not immediately seek treatment for this, but noticed yesterday that symptoms were recurring when he attempted to walk.  Upon arrival to the ED, he was found to have left facial droop as well as dysmetria.  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  He will be evaluated by Dr. Corliss Skains for for angiogram today.  Stroke: posterior stroke due to severe posterior circulation stenosis status post basilar artery angioplasty Code Stroke CT head No acute abnormality.  Multiple chronic infarcts involving left basal ganglia and posterior circulation.  ASPECTS 10.    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 Multiple new, small acute infarcts within both cerebellar hemispheres, right greater than left. 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 2. Unchanged bilateral P1 occlusions. No new intracranial occlusion MRA 11/30 post IR 1. Multiple new, small acute infarcts within both cerebellar hemispheres, right greater than left. 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 2. Unchanged bilateral P1 occlusions. No new intracranial occlusion. 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 noted on CTA Multiple chronic infarcts noted in posterior circulation Patient to be evaluated today with angiogram  Status post basilar artery angioplasty 11/30  Hypertension Home meds: Amlodipine 5 mg daily, lisinopril 40 mg daily Stable On amlodipine to 10mg   Avoid low BP Gradually decrease BP to goal in 3 to 5 days Long-term BP goal 130-150 given BA stenosis  Hyperlipidemia Home meds: Atorvastatin 10 mg daily LDL 58, goal < 70 Increased to 80 mg daily Continue statin at discharge  Other Stroke Risk Factors Advanced Age >/= 53  Former cigarette smoker Hx stroke  Other Active Problems None  Hospital day # 3   76 DNP, ACNPC-AG  [facility] Pager# [phone]   ATTENDING NOTE: I reviewed above note and agree with the assessment and plan. Pt was seen and examined.   Patient status post basilar artery angioplasty yesterday afternoon, overnight stable, BP around 1 50-1 60 range.  Left upper and lower extremity strength improved.  This morning patient more awake alert, reclining in bed,  eating breakfast, stated he felt good.  Still has moderate dysarthria.  MRI showed scattered bilateral small cerebellar infarcts as well as unchanged right pontine infarct.  MRA showed improved basilar artery to 50% stenosis and unchanged bilateral P1 occlusion.  On exam, wife at the bedside. Pt awake, alert, eyes open, orientated to age, place and people, answered month wroing. No aphasia, but paucity of speech and moderate dysarthria, following all simple commands. Able to name and repeat short sentences. No gaze palsy, tracking bilaterally, visual field full but decreased right visual field visual acuity, PERRL. Left facial droop, improved some. Tongue midline. LUE 3-/5 proximal and 3+/5 distally. LLE proximal 2+/5 and 3+/5 distal ankle PF/DF. Sensation decreased on the left, right FTN intact, gait not tested.   Continue aspirin and Brilinta.  Relax BP goal to < 180/105 after 24 hours of IR.  Gradually decrease BP to goal in 3 to 5 days, BP goal 130-150 given basilar artery stenosis.  Continue statin.  PT therapy recommend CIR.  For detailed assessment and plan, please refer to above/below as I have made changes wherever appropriate.   [phone], MD PhD Stroke Neurology 07/19/2022 7:10 PM   To contact Stroke Continuity provider, please refer to 14/08/2021. After hours, contact General Neurology

## 2022-07-19 NOTE — Evaluation (Signed)
Physical Therapy Evaluation Patient Details Name: Mitchell Washington MRN: [id number] DOB: 10/11/1950 Today's Date: 07/19/2022  History of Present Illness  [name] is a 71 y.o. male with history of stroke, hypertension, hyperlipidemia, memory loss, REM sleep behavior disorder, tremor, and dementia presenting with dysarthria and left sided weakness.  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  Basilar artery angioplasty completed on 11/30.  PMH of stroke, hypertension, hyperlipidemia, memory loss, REM sleep behavior disorder, tremor and dementia.  Clinical Impression  Pt admitted with/for stroke, MRI revealing an acute pontine infarct.  Pt presently needing moderate assist for basic mobility/pregait, gait not observed..  Pt currently limited functionally due to the problems listed. ( See problems list.)   Pt will benefit from PT to maximize function and safety in order to get ready for next venue listed below.        Recommendations for follow up therapy are one component of a multi-disciplinary discharge planning process, led by the attending physician.  Recommendations may be updated based on patient status, additional functional criteria and insurance authorization.  Follow Up Recommendations Acute inpatient rehab (3hours/day)      Assistance Recommended at Discharge Frequent or constant Supervision/Assistance  Patient can return home with the following  A little help with walking and/or transfers;A little help with bathing/dressing/bathroom;Assist for transportation;Help with stairs or ramp for entrance;Assistance with cooking/housework    Equipment Recommendations Other (comment) (TBD)  Recommendations for Other Services  Rehab consult    Functional Status Assessment       Precautions / Restrictions Precautions Precautions: Fall Precaution Comments: moderate left hemiparesis Restrictions Weight Bearing Restrictions: No       Mobility  Bed Mobility Overal bed mobility: Needs Assistance Bed Mobility: Supine to Sit     Supine to sit: Mod assist, HOB elevated     General bed mobility comments: Assist with lifting trunk up to sitting and for scooting hips to the EOB.    Transfers Overall transfer level: Needs assistance Equipment used: None Transfers: Sit to/from Stand, Bed to chair/wheelchair/BSC Sit to Stand: Mod assist   Step pivot transfers: Max assist, Mod assist       General transfer comment: Buckling noted in the LLE with attempting weightbearing, but improvement noted after so time in standing or during pre-gait in front of the recliner.    Ambulation/Gait               General Gait Details: stepped in place only as part of pre-gait activity.  Stairs            Wheelchair Mobility    Modified Rankin (Stroke Patients Only) Modified Rankin (Stroke Patients Only) Pre-Morbid Rankin Score: No symptoms Modified Rankin: Moderately severe disability     Balance Overall balance assessment: Needs assistance Sitting-balance support: Single extremity supported Sitting balance-Leahy Scale: Poor Sitting balance - Comments: LOB posteriorly and to the right with initially, improved with continued trials, but still needed UE assist for any balance challenge.     Standing balance-Leahy Scale: Poor Standing balance comment: pt reliant on external support with multiple standing trials at edge of the chair while the RN was completing hygienc/cleansing.                             Pertinent Vitals/Pain Pain Assessment Pain Assessment: Faces Faces Pain Scale: No hurt Pain Intervention(s): Monitored during session    Home Living Family/patient expects to  be discharged to:: Private residence Living Arrangements: Spouse/significant other   Type of Home: House Home Access: Stairs to enter Entrance Stairs-Rails: Can reach Chartered loss adjuster of Steps:  3   Home Layout: One level Home Equipment: Shower seat;Cane - single point      Prior Function Prior Level of Function : Independent/Modified Independent                     Hand Dominance   Dominant Hand: Right    Extremity/Trunk Assessment   Upper Extremity Assessment Upper Extremity Assessment: LUE deficits/detail LUE Deficits / Details: Brunnstrum stage IV in the arm and stage V in the hand.  AAROM WFLs for all joints, actively demonstrates shoulder flexion in synergy pattern to approximately 70 degrees.  Gross grasp present with digit extension at approximately 60% for digit 2 and 80% for digits 3-5. LUE Sensation: decreased light touch LUE Coordination: decreased fine motor;decreased gross motor    Lower Extremity Assessment Lower Extremity Assessment: RLE deficits/detail;LLE deficits/detail RLE Deficits / Details: generally WFL LLE Deficits / Details: generally weak at ~ 3/5, movement mostly synergistic, but some ability to isolate. LLE Coordination: decreased fine motor    Cervical / Trunk Assessment Cervical / Trunk Assessment: Other exceptions Cervical / Trunk Exceptions: keeps head tilted to the right in slight lateral flexion  Communication   Communication: Expressive difficulties  Cognition Arousal/Alertness: Awake/alert Behavior During Therapy: WFL for tasks assessed/performed Overall Cognitive Status: History of cognitive impairments - at baseline                                          General Comments General comments (skin integrity, edema, etc.): vss    Exercises     Assessment/Plan    PT Assessment Patient needs continued PT services  PT Problem List Decreased strength;Decreased activity tolerance;Decreased balance;Decreased mobility;Decreased coordination;Decreased knowledge of use of DME       PT Treatment Interventions DME instruction;Gait training;Functional mobility training;Therapeutic activities;Balance  training;Neuromuscular re-education;Patient/family education    PT Goals (Current goals can be found in the Care Plan section)  Acute Rehab PT Goals Patient Stated Goal: back independent PT Goal Formulation: With patient Time For Goal Achievement: 08/02/22 Potential to Achieve Goals: Fair    Frequency Min 4X/week     Co-evaluation               AM-PAC PT "6 Clicks" Mobility  Outcome Measure Help needed turning from your back to your side while in a flat bed without using bedrails?: A Little Help needed moving from lying on your back to sitting on the side of a flat bed without using bedrails?: A Lot Help needed moving to and from a bed to a chair (including a wheelchair)?: A Lot Help needed standing up from a chair using your arms (e.g., wheelchair or bedside chair)?: A Lot Help needed to walk in hospital room?: Total Help needed climbing 3-5 steps with a railing? : Total 6 Click Score: 11    End of Session   Activity Tolerance: Patient tolerated treatment well;Patient limited by fatigue Patient left: in chair;with call bell/phone within reach;with chair alarm set;with nursing/sitter in room Nurse Communication: Mobility status PT Visit Diagnosis: Other symptoms and signs involving the nervous system (R29.898);Muscle weakness (generalized) (M62.81);Other abnormalities of gait and mobility (R26.89)    Time: [id number] PT Time Calculation (min) (ACUTE ONLY): 22 min  Charges:   PT Evaluation $PT Eval Moderate Complexity: 1 Mod          07/19/2022  Ginger Carne., PT Acute Rehabilitation Services [phone]  (office)  Tessie Fass Stephanie Littman 07/19/2022, 5:29 PM

## 2022-07-19 NOTE — Progress Notes (Signed)
PROGRESS NOTE    Mitchell Washington  AOZ:[id number] DOB: 01-Sep-1950 DOA: 07/16/2022 PCP: Kandyce Rud, MD   Brief Narrative:  Mitchell Washington is a 71 y.o. male with medical history significant of  CVA, DmII, Gout, HLD , HTN ,CKD2, dementia, who presents to [facility] on 07/15/22 with complaint of intermittent unsteady gait and word finding difficulties.  Initial imaging concerning for occlusion, subsequently transferred to [facility] proper for interventional radiology/vascular surgery evaluation.    Assessment & Plan:   Principal Problem:   TIA (transient ischemic attack) Active Problems:   B12 deficiency   Benign essential hypertension   Cerebrovascular disease   Pure hypercholesterolemia   Type 2 diabetes mellitus with peripheral neuropathy (HCC)   Basilar artery stenosis with infarction (HCC)   TIA w/ small posterior CVA Recurrent CVA overnight -History of previous CVAs w/o residual defictis -CTA with findings of PCA occlusion /vertebral a occulusion mild to mod  -Stent 07/18/22 - CT head negative ,-MRI brain 11/28 remarkable for small CVA - Repeat MRI 11/30 shows multiple new acute infarcts in bilateral cerebellar hemispheres. - PT/OT/SLP to follow - will likely benefit from ongoing therapy/placement prior to discharge home -asa/plavix ongoing per neurology    Non insulin-dependent diabetes type 2, well-controlled -iss/fs  Lab Results  Component Value Date   HGBA1C 6.5 (H) 07/16/2022   Gout -no acute flare    HLD  -continue statin    HTN -strict control per neuro/IR as above   CKD2 -at baseline     Dementia, unspecified -resume home regimen    DVT prophylaxis: Heparin drip Code Status: Full Family Communication: At bedside  Status is: Inpatient  Dispo: The patient is from: Home              Anticipated d/c is to: TBD              Anticipated d/c date is: 48 hours              Patient currently not medically stable for discharge given need for  intervention as above  Consultants:  Interventional radiology, neurology  Procedures:  As above  Antimicrobials:  None  Subjective: Transient worsening in speech left-sided weakness and facial droop overnight, repeat MRI shows recurrent acute CVAs, multiple.  This morning patient's symptoms appear to be improving if not back to previous baseline per wife at bedside.  Patient is in good spirits denies nausea vomiting diarrhea constipation headache fevers chills or chest pain.  Objective: Vitals:   07/19/22 0645 07/19/22 0700 07/19/22 0715 07/19/22 0730  BP: (!) 147/64 (!) 149/63 (!) 148/69 (!) 146/62  Pulse: 68 66 67 63  Resp: (!) 22 (!) 21 (!) 21 (!) 24  Temp:      TempSrc:      SpO2: 95% 95% 98% 96%  Weight:      Height:        Intake/Output Summary (Last 24 hours) at 07/19/2022 0802 Last data filed at 07/19/2022 0700 Gross per 24 hour  Intake 2099.31 ml  Output 2050 ml  Net 49.31 ml    Filed Weights   07/16/22 0143  Weight: 81.3 kg    Examination:  General:  Pleasantly resting in bed, No acute distress. HEENT: Left-sided facial droop with somewhat slurred speech but intelligible. Neck:  Without mass or deformity.  Trachea is midline. Lungs:  Clear to auscultate bilaterally without rhonchi, wheeze, or rales. Heart:  Regular rate and rhythm.  Without murmurs, rubs, or gallops. Abdomen:  Soft,  nontender, nondistended.  Without guarding or rebound. Extremities: Left-sided weakness, 2 out of 5 strength distally; sensation and strength intact proximally at the hip and shoulder.  Data Reviewed: I have personally reviewed following labs and imaging studies  CBC: Recent Labs  Lab 07/15/22 1040 07/16/22 0510 07/17/22 0656 07/18/22 0746 07/19/22 0421  WBC 6.0 6.1 7.1 9.1 10.5  NEUTROABS 3.4  --   --   --   --   HGB 13.6 13.2 13.2 12.5* 11.8*  HCT 42.4 40.6 40.4 38.1* 34.8*  MCV 82.7 83.4 83.1 81.9 80.6  PLT 313 313 308 297 276    Basic Metabolic Panel: Recent  Labs  Lab 07/15/22 1040 07/16/22 0510 07/17/22 0656 07/19/22 0421  NA 141 140 139 140  K 4.0 3.8 3.7 4.0  CL 106 105 105 111  CO2 25 25 24 25   GLUCOSE 127* 76 94 154*  BUN 22 18 12 14   CREATININE 1.64* 1.60* 1.53* 1.49*  CALCIUM 9.3 9.1 8.6* 8.5*    GFR: Estimated Creatinine Clearance: 46.1 mL/min (A) (by C-G formula based on SCr of 1.49 mg/dL (H)). Liver Function Tests: Recent Labs  Lab 07/15/22 1040 07/16/22 0510  AST 20 13*  ALT 12 12  ALKPHOS 65 57  BILITOT 0.6 0.4  PROT 8.2* 7.2  ALBUMIN 4.2 3.6    No results for input(s): "LIPASE", "AMYLASE" in the last 168 hours. No results for input(s): "AMMONIA" in the last 168 hours. Coagulation Profile: Recent Labs  Lab 07/15/22 1040  INR 1.0    Cardiac Enzymes: No results for input(s): "CKTOTAL", "CKMB", "CKMBINDEX", "TROPONINI" in the last 168 hours. BNP (last 3 results) No results for input(s): "PROBNP" in the last 8760 hours. HbA1C: No results for input(s): "HGBA1C" in the last 72 hours.  CBG: Recent Labs  Lab 07/18/22 0631 07/18/22 1003 07/18/22 1625 07/18/22 2143 07/19/22 0633  GLUCAP 118* 107* 170* 240* 136*    Lipid Profile: No results for input(s): "CHOL", "HDL", "LDLCALC", "TRIG", "CHOLHDL", "LDLDIRECT" in the last 72 hours.  Thyroid Function Tests: No results for input(s): "TSH", "T4TOTAL", "FREET4", "T3FREE", "THYROIDAB" in the last 72 hours. Anemia Panel: No results for input(s): "VITAMINB12", "FOLATE", "FERRITIN", "TIBC", "IRON", "RETICCTPCT" in the last 72 hours. Sepsis Labs: No results for input(s): "PROCALCITON", "LATICACIDVEN" in the last 168 hours.  Recent Results (from the past 240 hour(s))  MRSA Next Gen by PCR, Nasal     Status: None   Collection Time: 07/18/22  6:32 PM   Specimen: Nasal Mucosa; Nasal Swab  Result Value Ref Range Status   MRSA by PCR Next Gen NOT DETECTED NOT DETECTED Final    Comment: (NOTE) The GeneXpert MRSA Assay (FDA approved for NASAL specimens  only), is one component of a comprehensive MRSA colonization surveillance program. It is not intended to diagnose MRSA infection nor to guide or monitor treatment for MRSA infections. Test performance is not FDA approved in patients less than 59 years old. Performed at [facility], [address] [place], [address] [place]      Radiology Studies: MR ANGIO HEAD WO CONTRAST  Result Date: 07/19/2022 CLINICAL DATA:  Stroke follow-up EXAM: MRI HEAD WITHOUT CONTRAST MRA HEAD WITHOUT CONTRAST TECHNIQUE: Multiplanar, multi-echo pulse sequences of the brain and surrounding structures were acquired without intravenous contrast. Angiographic images of the Circle of Willis were acquired using MRA technique without intravenous contrast. 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 There are multiple new acute infarcts  within both cerebellar hemispheres, right greater than left. These are all small. 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 There is multifocal hyperintense T2-weighted signal within the white matter. Generalized volume loss. 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 The midline structures are normal. Vascular: Major flow voids are preserved. Skull and upper cervical spine: Normal calvarium and skull base. Visualized upper cervical spine and soft tissues are normal. Sinuses/Orbits:No paranasal sinus fluid levels or advanced mucosal thickening. No mastoid or middle ear effusion. Normal orbits. MRA HEAD FINDINGS POSTERIOR CIRCULATION: --Vertebral arteries: Normal --Inferior cerebellar arteries: Normal. --Basilar artery: Normal. --Superior cerebellar arteries: Normal. --Posterior cerebral arteries: Unchanged bilateral P1 occlusion. ANTERIOR CIRCULATION: --Intracranial internal carotid arteries: Normal. --Anterior cerebral arteries (ACA): Normal. Hypoplastic left A1 segment, normal variant --Middle  cerebral arteries (MCA): Mild atherosclerotic irregularity of both M1 segments. ANATOMIC VARIANTS: None IMPRESSION: 1. Multiple new, small acute infarcts within both cerebellar hemispheres, right greater than left. 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 2. Unchanged bilateral P1 occlusions. No new intracranial occlusion. Electronically Signed   By: Deatra Robinson M.D.   On: 07/19/2022 01:43   MR BRAIN WO CONTRAST  Result Date: 07/19/2022 CLINICAL DATA:  Stroke follow-up EXAM: MRI HEAD WITHOUT CONTRAST MRA HEAD WITHOUT CONTRAST TECHNIQUE: Multiplanar, multi-echo pulse sequences of the brain and surrounding structures were acquired without intravenous contrast. Angiographic images of the Circle of Willis were acquired using MRA technique without intravenous contrast. 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 These are all small. 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 There is multifocal hyperintense T2-weighted signal within the white matter. Generalized volume loss. 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 The midline structures are normal. Vascular: Major flow voids are preserved. Skull and upper cervical spine: Normal calvarium and skull base. Visualized upper cervical spine and soft tissues are normal. Sinuses/Orbits:No paranasal sinus fluid levels or advanced mucosal thickening. No mastoid or middle ear effusion. Normal orbits. MRA HEAD FINDINGS POSTERIOR CIRCULATION: --Vertebral arteries: Normal --Inferior cerebellar arteries: Normal. --Basilar artery: Normal. --Superior cerebellar arteries: Normal. --Posterior cerebral arteries: Unchanged bilateral P1 occlusion. ANTERIOR CIRCULATION: --Intracranial internal carotid arteries: Normal. --Anterior cerebral arteries (ACA): Normal. Hypoplastic  left A1 segment, normal variant --Middle cerebral arteries (MCA): Mild atherosclerotic irregularity of both M1 segments. ANATOMIC VARIANTS: None IMPRESSION: 1. Multiple new, small acute infarcts within both cerebellar hemispheres, right greater than left. 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 2. Unchanged bilateral P1 occlusions. No new intracranial occlusion. Electronically Signed   By: Deatra Robinson M.D.   On: 07/19/2022 01:43    Scheduled Meds:  aspirin  81 mg Oral Daily   Or   aspirin  81 mg Per Tube Daily   atorvastatin  80 mg Oral Daily   Chlorhexidine Gluconate Cloth  6 each Topical Daily   insulin aspart  0-9 Units Subcutaneous TID AC & HS   ticagrelor  90 mg Oral BID   Or   ticagrelor  90 mg Per Tube BID   Continuous Infusions:  sodium chloride 75 mL/hr at 07/19/22 0710   clevidipine 5 mg/hr (07/19/22 0700)     LOS: 3 days   Time spent:  Azucena Fallen, DO [facility]  If 7PM-7AM, please contact night-coverage [email or web address]  07/19/2022, 8:02 AM

## 2022-07-19 NOTE — Evaluation (Signed)
Occupational Therapy Evaluation Patient Details Name: Mitchell Washington MRN: [id number] DOB: January 18, 1951 Today's Date: 07/19/2022   History of Present Illness Mr. ROWYN SPILDE is a 71 y.o. male with history of stroke, hypertension, hyperlipidemia, memory loss, REM sleep behavior disorder, tremor, and dementia presenting with dysarthria and left sided weakness.  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  Basilar artery angioplasty completed on 11/30.  PMH of stroke, hypertension, hyperlipidemia, memory loss, REM sleep behavior disorder, tremor and dementia.   Clinical Impression   Pt currently needs max assist for LB selfcare sit to stand and stand pivot transfers without an assistive device.  LUE and LLE hemiparesis is present as well impacting ADL independence.  Feel he will benefit from acute care OT to address these issues in order to progress back to a level that his spouse can provide safe assist.  Recommend more intense rehab at AIR level once medically stable in order to maximize progress and efficiency.        Recommendations for follow up therapy are one component of a multi-disciplinary discharge planning process, led by the attending physician.  Recommendations may be updated based on patient status, additional functional criteria and insurance authorization.   Follow Up Recommendations  Acute inpatient rehab (3hours/day)     Assistance Recommended at Discharge Frequent or constant Supervision/Assistance  Patient can return home with the following A little help with walking and/or transfers;A little help with bathing/dressing/bathroom;Assistance with cooking/housework;Direct supervision/assist for financial management;Assist for transportation;Help with stairs or ramp for entrance;Direct supervision/assist for medications management    Functional Status Assessment  Patient has had a recent decline in their functional status and demonstrates the  ability to make significant improvements in function in a reasonable and predictable amount of time.  Equipment Recommendations  Other (comment) (TBD next venue of care)    Recommendations for Other Services Rehab consult     Precautions / Restrictions Precautions Precautions: Fall Precaution Comments: moderate left hemiparesis Restrictions Weight Bearing Restrictions: No      Mobility Bed Mobility Overal bed mobility: Needs Assistance Bed Mobility: Supine to Sit     Supine to sit: Mod assist, HOB elevated     General bed mobility comments: Assist with lifting trunk up to sitting and for scooting hips to the EOB.    Transfers Overall transfer level: Needs assistance Equipment used: None Transfers: Sit to/from Stand, Bed to chair/wheelchair/BSC Sit to Stand: Max assist     Step pivot transfers: Max assist     General transfer comment: Buckling noted in the LLE with attempting weightbearing.      Balance Overall balance assessment: Needs assistance Sitting-balance support: Single extremity supported Sitting balance-Leahy Scale: Poor Sitting balance - Comments: LOB posteriorly and to the right with initial sitting.  Improved with increased time     Standing balance-Leahy Scale: Poor Standing balance comment: Pt needs mod assist to maintain static standing and max assist for stepping.                           ADL either performed or assessed with clinical judgement   ADL Overall ADL's : Needs assistance/impaired Eating/Feeding: Supervision/ safety;Sitting Eating/Feeding Details (indicate cue type and reason): RUE only     Upper Body Bathing: Minimal assistance;Sitting Upper Body Bathing Details (indicate cue type and reason): simulated Lower Body Bathing: Maximal assistance;Sit to/from stand Lower Body Bathing Details (indicate cue type and reason): simulated Upper Body Dressing :  Sitting;Moderate assistance Upper Body Dressing Details (indicate  cue type and reason): simulated Lower Body Dressing: Total assistance;Sit to/from stand   Toilet Transfer: Maximal assistance;Stand-pivot Toilet Transfer Details (indicate cue type and reason): simulated to bedside recliner Toileting- Clothing Manipulation and Hygiene: Maximal assistance;Sit to/from stand       Functional mobility during ADLs: Maximal assistance (for two small steps around to the recliner from the EOB.) General ADL Comments: Pt completed supine to sit EOB with mod assist.  Static sitting balance at overall min assist initially but improved to min guard.  Mod to max for dynamic sitting balance secondary to LOB posteriorly and to the right.  Dysarthric speech with pt needing cueing to slow down and over pronounce his words.  Educated pt and spouse onLUE positioning and  AAROM exercises for the shoulder, elbow, and hand but will need review with handout next visit.     Vision Baseline Vision/History: 1 Wears glasses Ability to See in Adequate Light: 0 Adequate Patient Visual Report: Diplopia Vision Assessment?: Yes Eye Alignment: Within Functional Limits Ocular Range of Motion: Within Functional Limits Alignment/Gaze Preference: Head tilt (right head tilt) Tracking/Visual Pursuits: Able to track stimulus in all quads without difficulty Additional Comments: Pt reporting some diplopia when looking at TV but not present this session near or far.            Pertinent Vitals/Pain Pain Assessment Pain Assessment: No/denies pain     Hand Dominance Right   Extremity/Trunk Assessment Upper Extremity Assessment Upper Extremity Assessment: LUE deficits/detail LUE Deficits / Details: Brunnstrum stage IV in the arm and stage V in the hand.  AAROM WFLs for all joints, actively demonstrates shoulder flexion in synergy pattern to approximately 70 degrees.  Gross grasp present with digit extension at approximately 60% for digit 2 and 80% for digits 3-5. LUE Sensation: decreased  light touch LUE Coordination: decreased fine motor;decreased gross motor   Lower Extremity Assessment Lower Extremity Assessment: Defer to PT evaluation   Cervical / Trunk Assessment Cervical / Trunk Assessment: Other exceptions Cervical / Trunk Exceptions: keeps head tilted to the right in slight lateral flexion   Communication Communication Communication: Expressive difficulties   Cognition Arousal/Alertness: Awake/alert Behavior During Therapy: WFL for tasks assessed/performed Overall Cognitive Status: History of cognitive impairments - at baseline                                 General Comments: Pt with history of dementia.  Will continue to look at more in treatment.  Oriented to city but not place, unsure of why he is in the hospital but stated he can't walk and use his left hand.                Home Living Family/patient expects to be discharged to:: Private residence Living Arrangements: Spouse/significant other   Type of Home: House Home Access: Stairs to enter Secretary/administrator of Steps: 3 Entrance Stairs-Rails: Can reach both;Left;Right Home Layout: One level     Bathroom Shower/Tub: Producer, television/film/video: Standard Bathroom Accessibility: Yes How Accessible: Accessible via walker;Accessible via wheelchair Home Equipment: Shower seat;Cane - single point          Prior Functioning/Environment Prior Level of Function : Independent/Modified Independent                        OT Problem List: Decreased strength;Decreased range of motion;Impaired  balance (sitting and/or standing);Decreased safety awareness;Impaired vision/perception;Decreased knowledge of use of DME or AE;Decreased coordination;Pain;Impaired sensation;Impaired UE functional use;Decreased cognition      OT Treatment/Interventions: Self-care/ADL training    OT Goals(Current goals can be found in the care plan section) Acute Rehab OT Goals Patient  Stated Goal: 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 OT Goal Formulation: With patient/family Time For Goal Achievement: 08/02/22 Potential to Achieve Goals: Good  OT Frequency: Min 2X/week       AM-PAC OT "6 Clicks" Daily Activity     Outcome Measure Help from another person eating meals?: A Little Help from another person taking care of personal grooming?: A Lot Help from another person toileting, which includes using toliet, bedpan, or urinal?: A Lot Help from another person bathing (including washing, rinsing, drying)?: A Lot Help from another person to put on and taking off regular upper body clothing?: A Lot Help from another person to put on and taking off regular lower body clothing?: A Lot 6 Click Score: 13   End of Session Nurse Communication: Mobility status  Activity Tolerance: Patient tolerated treatment well Patient left: with call bell/phone within reach;in chair  OT Visit Diagnosis: Unsteadiness on feet (R26.81);Other abnormalities of gait and mobility (R26.89);Muscle weakness (generalized) (M62.81);Hemiplegia and hemiparesis Hemiplegia - Right/Left: Left Hemiplegia - dominant/non-dominant: Non-Dominant Hemiplegia - caused by: Cerebral infarction                Time: 1132-1218 OT Time Calculation (min): 46 min Charges:  OT General Charges $OT Visit: 1 Visit OT Evaluation $OT Eval Moderate Complexity: 1 Mod OT Treatments $Self Care/Home Management : 23-37 mins  Christopher Hink OTR/L 07/19/2022, 1:44 PM

## 2022-07-19 NOTE — Progress Notes (Signed)
Per day shift nurse, verbal order from Neuro with blood pressure parameters of 130-160.

## 2022-07-20 DIAGNOSIS — I651 Occlusion and stenosis of basilar artery: Secondary | ICD-10-CM | POA: Diagnosis not present

## 2022-07-20 DIAGNOSIS — G459 Transient cerebral ischemic attack, unspecified: Secondary | ICD-10-CM | POA: Diagnosis not present

## 2022-07-20 DIAGNOSIS — I639 Cerebral infarction, unspecified: Secondary | ICD-10-CM | POA: Diagnosis not present

## 2022-07-20 LAB — GLUCOSE, CAPILLARY
Glucose-Capillary: 132 mg/dL — ABNORMAL HIGH (ref 70–99)
Glucose-Capillary: 173 mg/dL — ABNORMAL HIGH (ref 70–99)
Glucose-Capillary: 127 mg/dL — ABNORMAL HIGH (ref 70–99)
Glucose-Capillary: 219 mg/dL — ABNORMAL HIGH (ref 70–99)

## 2022-07-20 LAB — BASIC METABOLIC PANEL
Anion gap: 10 (ref 5–15)
BUN: 15 mg/dL (ref 8–23)
CO2: 22 mmol/L (ref 22–32)
Calcium: 8.9 mg/dL (ref 8.9–10.3)
Chloride: 109 mmol/L (ref 98–111)
Creatinine, Ser: 1.42 mg/dL — ABNORMAL HIGH (ref 0.61–1.24)
GFR, Estimated: 53 mL/min — ABNORMAL LOW (ref >60)
Glucose, Bld: 130 mg/dL — ABNORMAL HIGH (ref 70–99)
Potassium: 3.6 mmol/L (ref 3.5–5.1)
Sodium: 141 mmol/L (ref 135–145)

## 2022-07-20 LAB — CBC
HCT: 34.8 % — ABNORMAL LOW (ref 39.0–52.0)
Hemoglobin: 11.4 g/dL — ABNORMAL LOW (ref 13.0–17.0)
MCH: 26.8 pg (ref 26.0–34.0)
MCHC: 32.8 g/dL (ref 30.0–36.0)
MCV: 81.9 fL (ref 80.0–100.0)
Platelets: 297 10*3/uL (ref 150–400)
RBC: 4.25 MIL/uL (ref 4.22–5.81)
RDW: 13.4 % (ref 11.5–15.5)
WBC: 8.1 10*3/uL (ref 4.0–10.5)
nRBC: 0 % (ref 0.0–0.2)

## 2022-07-20 MED ORDER — LISINOPRIL 20 MG PO TABS
40.0000 mg | ORAL_TABLET | Freq: Every day | ORAL | Status: DC
  Administered 2022-07-20 – 2022-07-24 (×5): 40 mg via ORAL
  Filled 2022-07-20 (×5): qty 2

## 2022-07-20 MED ORDER — LABETALOL HCL 5 MG/ML IV SOLN: 10.0000 mg | INTRAVENOUS | Status: DC | PRN

## 2022-07-20 MED ORDER — LABETALOL HCL 5 MG/ML IV SOLN
10.0000 mg | INTRAVENOUS | Status: DC | PRN
  Administered 2022-07-20: 10 mg via INTRAVENOUS
  Filled 2022-07-20: qty 4

## 2022-07-20 NOTE — Progress Notes (Addendum)
PROGRESS NOTE    Mitchell Washington  [id number] DOB: [birth year] DOA: 07/16/2022 PCP: Derinda Late, MD   Brief Narrative:  Mitchell Washington is a 71 y.o. male with medical history significant of  CVA, DmII, Gout, HLD , HTN ,CKD2, dementia, who presents to [facility] on 07/15/22 with complaint of intermittent unsteady gait and word finding difficulties.  Initial imaging concerning for occlusion, subsequently transferred to [facility] proper for interventional radiology/vascular surgery evaluation.    Assessment & Plan:   Principal Problem:   TIA (transient ischemic attack) Active Problems:   B12 deficiency   Benign essential hypertension   Cerebrovascular disease   Pure hypercholesterolemia   Type 2 diabetes mellitus with peripheral neuropathy (HCC)   Basilar artery stenosis with infarction (HCC)   TIA w/ small posterior CVA Recurrent CVA overnight -History of previous CVAs w/o residual defictis -CTA with findings of PCA occlusion /vertebral a occulusion mild to mod  -Stent 07/18/22 - CT head negative ,-MRI brain 11/28 remarkable for small CVA - Repeat MRI 11/30 shows multiple new acute infarcts in bilateral cerebellar hemispheres. - PT/OT/SLP to follow - will likely benefit from ongoing therapy/placement prior to discharge home -asa/plavix ongoing per neurology - OK to transfer out of ICU if ok with neuro/IRSx    Non insulin-dependent diabetes type 2, well-controlled -iss/fs  Lab Results  Component Value Date   HGBA1C 6.5 (H) 07/16/2022   Gout -no acute flare    HLD  -continue statin    HTN -strict control per neuro/IR as above   CKD2 -at baseline     Dementia, unspecified -resume home regimen    DVT prophylaxis: Heparin drip Code Status: Full Family Communication: At bedside  Status is: Inpatient  Dispo: The patient is from: Home              Anticipated d/c is to: CIR currently recommended              Anticipated d/c date is: 48 hours              Patient  currently not medically stable for discharge given need for intervention as above  Consultants:  Interventional radiology, neurology  Procedures:  As above  Antimicrobials:  None  Subjective: No acute issues or events overnight, patient appears to be improving somewhat, remains weak requiring max assist with PT recommending CIR, wife and patient agreeable at this time.  Objective: Vitals:   07/20/22 0400 07/20/22 0500 07/20/22 0600 07/20/22 0700  BP: (!) 176/70 (!) 192/80 (!) 185/84 (!) 176/88  Pulse: 62 61 63 72  Resp: 13 16 16 13   Temp: 99.4 F (37.4 C)     TempSrc: Axillary     SpO2: 93% (!) 89% 94% 96%  Weight:      Height:        Intake/Output Summary (Last 24 hours) at 07/20/2022 0745 Last data filed at 07/20/2022 0600 Gross per 24 hour  Intake 1148.25 ml  Output 1700 ml  Net -551.75 ml    Filed Weights   07/16/22 0143  Weight: 81.3 kg    Examination:  General:  Pleasantly resting in bed, No acute distress. HEENT: Left-sided facial droop with somewhat slurred speech but intelligible. Neck:  Without mass or deformity.  Trachea is midline. Lungs:  Clear to auscultate bilaterally without rhonchi, wheeze, or rales. Heart:  Regular rate and rhythm.  Without murmurs, rubs, or gallops. Abdomen:  Soft, nontender, nondistended.  Without guarding or rebound. Extremities: Left-sided weakness, 2 out  of 5 strength distally; sensation and strength intact proximally at the hip and shoulder.  Data Reviewed: I have personally reviewed following labs and imaging studies  CBC: Recent Labs  Lab 07/15/22 1040 07/16/22 0510 07/17/22 0656 07/18/22 0746 07/19/22 0421 07/20/22 0414  WBC 6.0 6.1 7.1 9.1 10.5 8.1  NEUTROABS 3.4  --   --   --   --   --   HGB 13.6 13.2 13.2 12.5* 11.8* 11.4*  HCT 42.4 40.6 40.4 38.1* 34.8* 34.8*  MCV 82.7 83.4 83.1 81.9 80.6 81.9  PLT 313 313 308 297 276 [id number]    Basic Metabolic Panel: Recent Labs  Lab 07/15/22 1040 07/16/22 0510  07/17/22 0656 07/19/22 0421 07/20/22 0414  NA 141 140 139 140 141  K 4.0 3.8 3.7 4.0 3.6  CL 106 105 105 111 109  CO2 25 25 24 25 22   GLUCOSE 127* 76 94 154* 130*  BUN 22 18 12 14 15   CREATININE 1.64* 1.60* 1.53* 1.49* 1.42*  CALCIUM 9.3 9.1 8.6* 8.5* 8.9    GFR: Estimated Creatinine Clearance: 48.4 mL/min (A) (by C-G formula based on SCr of 1.42 mg/dL (H)). Liver Function Tests: Recent Labs  Lab 07/15/22 1040 07/16/22 0510  AST 20 13*  ALT 12 12  ALKPHOS 65 57  BILITOT 0.6 0.4  PROT 8.2* 7.2  ALBUMIN 4.2 3.6    No results for input(s): "LIPASE", "AMYLASE" in the last 168 hours. No results for input(s): "AMMONIA" in the last 168 hours. Coagulation Profile: Recent Labs  Lab 07/15/22 1040  INR 1.0    Cardiac Enzymes: No results for input(s): "CKTOTAL", "CKMB", "CKMBINDEX", "TROPONINI" in the last 168 hours. BNP (last 3 results) No results for input(s): "PROBNP" in the last 8760 hours. HbA1C: No results for input(s): "HGBA1C" in the last 72 hours.  CBG: Recent Labs  Lab 07/19/22 0633 07/19/22 0826 07/19/22 1119 07/19/22 1748 07/19/22 2146  GLUCAP 136* 124* 164* 149* 158*    Lipid Profile: No results for input(s): "CHOL", "HDL", "LDLCALC", "TRIG", "CHOLHDL", "LDLDIRECT" in the last 72 hours.  Thyroid Function Tests: No results for input(s): "TSH", "T4TOTAL", "FREET4", "T3FREE", "THYROIDAB" in the last 72 hours. Anemia Panel: No results for input(s): "VITAMINB12", "FOLATE", "FERRITIN", "TIBC", "IRON", "RETICCTPCT" in the last 72 hours. Sepsis Labs: No results for input(s): "PROCALCITON", "LATICACIDVEN" in the last 168 hours.  Recent Results (from the past 240 hour(s))  MRSA Next Gen by PCR, Nasal     Status: None   Collection Time: 07/18/22  6:32 PM   Specimen: Nasal Mucosa; Nasal Swab  Result Value Ref Range Status   MRSA by PCR Next Gen NOT DETECTED NOT DETECTED Final    Comment: (NOTE) The GeneXpert MRSA Assay (FDA approved for NASAL specimens  only), is one component of a comprehensive MRSA colonization surveillance program. It is not intended to diagnose MRSA infection nor to guide or monitor treatment for MRSA infections. Test performance is not FDA approved in patients less than 46 years old. Performed at [facility], [place] [address] [place], [place] [address]      Radiology Studies: MR ANGIO HEAD WO CONTRAST  Result Date: 07/19/2022 CLINICAL DATA:  Stroke follow-up EXAM: MRI HEAD WITHOUT CONTRAST MRA HEAD WITHOUT CONTRAST TECHNIQUE: Multiplanar, multi-echo pulse sequences of the brain and surrounding structures were acquired without intravenous contrast. Angiographic images of the Circle of Willis were acquired using MRA technique without intravenous contrast. COMPARISON:  07/16/2022 FINDINGS: MRI HEAD FINDINGS Brain: Unchanged appearance of right paramedian  pontine infarct. 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 These are all small. 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 There is multifocal hyperintense T2-weighted signal within the white matter. Generalized volume loss. 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 The midline structures are normal. Vascular: Major flow voids are preserved. Skull and upper cervical spine: Normal calvarium and skull base. Visualized upper cervical spine and soft tissues are normal. Sinuses/Orbits:No paranasal sinus fluid levels or advanced mucosal thickening. No mastoid or middle ear effusion. Normal orbits. MRA HEAD FINDINGS POSTERIOR CIRCULATION: --Vertebral arteries: Normal --Inferior cerebellar arteries: Normal. --Basilar artery: Normal. --Superior cerebellar arteries: Normal. --Posterior cerebral arteries: Unchanged bilateral P1 occlusion. ANTERIOR CIRCULATION: --Intracranial internal carotid arteries: Normal. --Anterior cerebral arteries (ACA): Normal. Hypoplastic left A1 segment, normal variant --Middle  cerebral arteries (MCA): Mild atherosclerotic irregularity of both M1 segments. ANATOMIC VARIANTS: None IMPRESSION: 1. Multiple new, small acute infarcts within both cerebellar hemispheres, right greater than left. 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 2. Unchanged bilateral P1 occlusions. No new intracranial occlusion. Electronically Signed   By: Deatra Robinson M.D.   On: 07/19/2022 01:43   MR BRAIN WO CONTRAST  Result Date: 07/19/2022 CLINICAL DATA:  Stroke follow-up EXAM: MRI HEAD WITHOUT CONTRAST MRA HEAD WITHOUT CONTRAST TECHNIQUE: Multiplanar, multi-echo pulse sequences of the brain and surrounding structures were acquired without intravenous contrast. Angiographic images of the Circle of Willis were acquired using MRA technique without intravenous contrast. 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 These are all small. 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 There is multifocal hyperintense T2-weighted signal within the white matter. Generalized volume loss. 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 The midline structures are normal. Vascular: Major flow voids are preserved. Skull and upper cervical spine: Normal calvarium and skull base. Visualized upper cervical spine and soft tissues are normal. Sinuses/Orbits:No paranasal sinus fluid levels or advanced mucosal thickening. No mastoid or middle ear effusion. Normal orbits. MRA HEAD FINDINGS POSTERIOR CIRCULATION: --Vertebral arteries: Normal --Inferior cerebellar arteries: Normal. --Basilar artery: Normal. --Superior cerebellar arteries: Normal. --Posterior cerebral arteries: Unchanged bilateral P1 occlusion. ANTERIOR CIRCULATION: --Intracranial internal carotid arteries: Normal. --Anterior cerebral arteries (ACA): Normal. Hypoplastic  left A1 segment, normal variant --Middle cerebral arteries (MCA): Mild atherosclerotic irregularity of both M1 segments. ANATOMIC VARIANTS: None IMPRESSION: 1. Multiple new, small acute infarcts within both cerebellar hemispheres, right greater than left. 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 2. Unchanged bilateral P1 occlusions. No new intracranial occlusion. Electronically Signed   By: Deatra Robinson M.D.   On: 07/19/2022 01:43    Scheduled Meds:  amLODipine  10 mg Oral Daily   aspirin  81 mg Oral Daily   atorvastatin  80 mg Oral Daily   Chlorhexidine Gluconate Cloth  6 each Topical Daily   cyanocobalamin  1,000 mcg Oral Daily   donepezil  10 mg Oral QHS   heparin injection (subcutaneous)  5,000 Units Subcutaneous Q8H   insulin aspart  0-9 Units Subcutaneous TID WC   memantine  10 mg Oral BID   ticagrelor  90 mg Oral BID   Continuous Infusions:  clevidipine Stopped (07/19/22 1828)     LOS: 4 days   Time spent:  Azucena Fallen, DO [facility]  If 7PM-7AM, please contact night-coverage [email or web address]  07/20/2022, 7:45  AM

## 2022-07-20 NOTE — Progress Notes (Signed)
Inpatient Rehab Admissions Coordinator Note:   Per therapy patient was screened for CIR candidacy by Catelynn Sparger Luvenia Starch, CCC-SLP. At this time, pt appears to be a potential candidate for CIR. I will place an order for rehab consult for full assessment, per our protocol.  Please contact me any with questions.Wolfgang Phoenix, MS, CCC-SLP Admissions Coordinator [phone] 07/20/22 5:27 PM

## 2022-07-20 NOTE — Progress Notes (Addendum)
STROKE TEAM PROGRESS NOTE   INTERVAL HISTORY Wife is at the bedside. Patient is alert and awake. Oriented to self, hospital, can identify his wife. Unable to state correct year. No blink to threat on left. Slight right gaze, can cross midline. Left facial. Mild dysarthria. Able to name objects Let arm 3/5. LLE 2-3/5  Added home lisinopril 40mg . Long term BP goal 130-160. Ok to transfer out of the unit to the floor.   Vitals:   07/20/22 0900 07/20/22 1000 07/20/22 1030 07/20/22 1100  BP: (!) 186/73 (!) 178/75 (!) 166/73 (!) 166/72  Pulse: 67 75  72  Resp: 20 20  16   Temp:      TempSrc:      SpO2: 93% 95%  93%  Weight:      Height:       CBC:  Recent Labs  Lab 07/15/22 1040 07/16/22 0510 07/19/22 0421 07/20/22 0414  WBC 6.0   < > 10.5 8.1  NEUTROABS 3.4  --   --   --   HGB 13.6   < > 11.8* 11.4*  HCT 42.4   < > 34.8* 34.8*  MCV 82.7   < > 80.6 81.9  PLT 313   < > 276 297   < > = values in this interval not displayed.    Basic Metabolic Panel:  Recent Labs  Lab 07/19/22 0421 07/20/22 0414  NA 140 141  K 4.0 3.6  CL 111 109  CO2 25 22  GLUCOSE 154* 130*  BUN 14 15  CREATININE 1.49* 1.42*  CALCIUM 8.5* 8.9    Lipid Panel:  Recent Labs  Lab 07/16/22 0510  CHOL 105  TRIG 80  HDL 31*  CHOLHDL 3.4  VLDL 16  LDLCALC 58    HgbA1c:  Recent Labs  Lab 07/16/22 0510  HGBA1C 6.5*    Urine Drug Screen:  Recent Labs  Lab 07/16/22 0709  LABOPIA NONE DETECTED  COCAINSCRNUR NONE DETECTED  LABBENZ NONE DETECTED  AMPHETMU NONE DETECTED  THCU NONE DETECTED  LABBARB NONE DETECTED     Alcohol Level  Recent Labs  Lab 07/15/22 1040  ETH <10    IMAGING past 24 hours No results found.  PHYSICAL EXAM General: Alert, well-nourished, well-developed elderly patient in no acute distress Respiratory: Regular, unlabored respirations on room air  Neuro - awake, alert, eyes open, severe dysarthria with anarthria at times.  Knew his name, and that he was in the  hospital. Got they as 2012. however following all simple commands. No gaze palsy, tracking bilaterally, visual field full, PERRL.  Continued left facial droop. Tongue midline.  Right upper extremity at least 4/5 and right lower extremity at least 3/5.  Left upper extremity 3 -/5, left lower extremity 2/5.  Sensation symmetrical bilaterally subjectively the, right FTN intact, gait not tested.    ASSESSMENT/PLAN [name] is a 71 y.o. male with history of stroke, hypertension, hyperlipidemia, memory loss, REM sleep behavior disorder, tremor and dementia presenting with  difficulty with ambulation and maintaining equilibrium which started on 07/14/2022.  He did not immediately seek treatment for this, but noticed yesterday that symptoms were recurring when he attempted to walk.  Upon arrival to the ED, he was found to have left facial droop as well as dysmetria.  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  evaluated by Dr. [id number] for for angiogram.  Stroke: posterior stroke due  to severe posterior circulation stenosis status post basilar artery angioplasty Code Stroke CT head No acute abnormality.  Multiple chronic infarcts involving left basal ganglia and posterior circulation.  ASPECTS 10.    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, moderate stenosis of bilateral carotids CT perfusion 24 mL penumbra in occipital lobes, corresponding with chronic infarcts MRI no acute infarct, multiple chronic infarcts in cerebellum, bilateral PCA territories and left basal ganglia, mild chronic small vessel ischemic changes MRI brain 11/28: small acute infarct of the right paramedial ventral pons MRI repeat 11/30 post IR 1. Multiple new, small acute infarcts within both cerebellar hemispheres, right greater than left. 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 2.  Unchanged bilateral P1 occlusions. No new intracranial occlusion MRA 11/30 post IR 1. Multiple new, small acute infarcts within both cerebellar hemispheres, right greater than left. 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 2. Unchanged bilateral P1 occlusions. No new intracranial occlusion. 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 noted on CTA Multiple chronic infarcts noted in posterior circulation Patient to be evaluated today with angiogram  Status post basilar artery angioplasty 11/30  Hypertension Home meds: Amlodipine 5 mg daily, lisinopril 40 mg daily Stable On amlodipine to 10mg   Added home lisinopril  Avoid low BP Gradually decrease BP to goal in 3 to 5 days Long-term BP goal 130-150 given BA stenosis  Hyperlipidemia Home meds: Atorvastatin 10 mg daily LDL 58, goal < 70 Increased to 80 mg daily Continue statin at discharge  Other Stroke Risk Factors Advanced Age >/= 81  Former cigarette smoker Hx stroke  Other Active Problems None  Neurology will sign off. Please call with questions or concerns   Hospital day # 4   [address] [facility], [facility]  [facility] Pager# [phone]   ATTENDING ATTESTATION:  Stroke workup completed. Please call Neurology if needed. Ok to transfer to floor. F/u with stroke and IR clinic outpt.  Dr. [phone] evaluated pt independently, reviewed imaging, chart, labs. Discussed and formulated plan with the Resident/APP. Changes were made to the note where appropriate. Please see APP/resident note above for details.     MDM: Moderate:  Pertinent labs, imaging results reviewed by me and considered in my decision making. Independently reviewed imaging. Medical records reviewed.    Lourene Hoston,MD    To contact Stroke Continuity provider, please refer to Viviann Spare. After hours, contact General Neurology

## 2022-07-21 DIAGNOSIS — I639 Cerebral infarction, unspecified: Secondary | ICD-10-CM | POA: Diagnosis not present

## 2022-07-21 DIAGNOSIS — I651 Occlusion and stenosis of basilar artery: Secondary | ICD-10-CM | POA: Diagnosis not present

## 2022-07-21 LAB — CBC
HCT: 37.7 % — ABNORMAL LOW (ref 39.0–52.0)
Hemoglobin: 12.5 g/dL — ABNORMAL LOW (ref 13.0–17.0)
MCH: 26.9 pg (ref 26.0–34.0)
MCHC: 33.2 g/dL (ref 30.0–36.0)
MCV: 81.1 fL (ref 80.0–100.0)
Platelets: 318 10*3/uL (ref 150–400)
RBC: 4.65 MIL/uL (ref 4.22–5.81)
RDW: 13.2 % (ref 11.5–15.5)
WBC: 9.1 10*3/uL (ref 4.0–10.5)
nRBC: 0 % (ref 0.0–0.2)

## 2022-07-21 LAB — BASIC METABOLIC PANEL
Anion gap: 8 (ref 5–15)
BUN: 15 mg/dL (ref 8–23)
CO2: 23 mmol/L (ref 22–32)
Calcium: 8.9 mg/dL (ref 8.9–10.3)
Chloride: 108 mmol/L (ref 98–111)
Creatinine, Ser: 1.38 mg/dL — ABNORMAL HIGH (ref 0.61–1.24)
GFR, Estimated: 55 mL/min — ABNORMAL LOW (ref >60)
Glucose, Bld: 168 mg/dL — ABNORMAL HIGH (ref 70–99)
Potassium: 3.7 mmol/L (ref 3.5–5.1)
Sodium: 139 mmol/L (ref 135–145)

## 2022-07-21 LAB — GLUCOSE, CAPILLARY
Glucose-Capillary: 176 mg/dL — ABNORMAL HIGH (ref 70–99)
Glucose-Capillary: 334 mg/dL — ABNORMAL HIGH (ref 70–99)
Glucose-Capillary: 237 mg/dL — ABNORMAL HIGH (ref 70–99)
Glucose-Capillary: 212 mg/dL — ABNORMAL HIGH (ref 70–99)

## 2022-07-21 MED ORDER — CARVEDILOL 3.125 MG PO TABS
3.1250 mg | ORAL_TABLET | Freq: Two times a day (BID) | ORAL | Status: DC
  Administered 2022-07-21 – 2022-07-24 (×7): 3.125 mg via ORAL
  Filled 2022-07-21 (×7): qty 1

## 2022-07-21 NOTE — Plan of Care (Signed)
  Problem: Clinical Measurements: Goal: Will remain free from infection Outcome: Progressing   Problem: Coping: Goal: Level of anxiety will decrease Outcome: Progressing   Problem: Pain Managment: Goal: General experience of comfort will improve Outcome: Not Progressing

## 2022-07-21 NOTE — Progress Notes (Signed)
Physical Therapy Treatment Patient Details Name: Mitchell Washington MRN: [id number] DOB: [birth year] Today's Date: 07/21/2022   History of Present Illness [name] is a 71 y.o. male with history of stroke, hypertension, hyperlipidemia, memory loss, REM sleep behavior disorder, tremor, and dementia presenting with dysarthria and left sided weakness.  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  Basilar artery angioplasty completed on 11/30.  PMH of stroke, hypertension, hyperlipidemia, memory loss, REM sleep behavior disorder, tremor and dementia.    PT Comments    Pt received in bed. Mod assist supine to sit, mod assist sit to stand, and mod assist lateral scoot transfer bed to recliner. Pt with new c/o pain L elbow, L knee, and L foot.  All areas are painful to touch and with ROM. Edema noted L knee. Wife present in room and reports pt with remote history of gout. Pain was limiting pt's ability to tolerate standing. Pt in recliner with feet elevated at end of session.   Recommendations for follow up therapy are one component of a multi-disciplinary discharge planning process, led by the attending physician.  Recommendations may be updated based on patient status, additional functional criteria and insurance authorization.  Follow Up Recommendations  Acute inpatient rehab (3hours/day)     Assistance Recommended at Discharge Frequent or constant Supervision/Assistance  Patient can return home with the following A little help with walking and/or transfers;A little help with bathing/dressing/bathroom;Assist for transportation;Help with stairs or ramp for entrance;Assistance with cooking/housework   Equipment Recommendations  Other (comment) (TBD)    Recommendations for Other Services       Precautions / Restrictions Precautions Precautions: Fall Precaution Comments: moderate left hemiparesis     Mobility  Bed Mobility Overal bed mobility: Needs  Assistance Bed Mobility: Supine to Sit     Supine to sit: Mod assist, HOB elevated     General bed mobility comments: cues for sequencing, increased time    Transfers Overall transfer level: Needs assistance Equipment used: None Transfers: Sit to/from Stand, Bed to chair/wheelchair/BSC Sit to Stand: Mod assist          Lateral/Scoot Transfers: Mod assist General transfer comment: Due to pain in L knee, pt unable to tolerate therapist blocking knee for SPT. Therefore, lateral scoot transfer performed bed to recliner.    Ambulation/Gait                   Stairs             Wheelchair Mobility    Modified Rankin (Stroke Patients Only) Modified Rankin (Stroke Patients Only) Pre-Morbid Rankin Score: No symptoms Modified Rankin: Moderately severe disability     Balance Overall balance assessment: Needs assistance Sitting-balance support: Feet supported, Single extremity supported Sitting balance-Leahy Scale: Poor   Postural control: Posterior lean, Right lateral lean                                  Cognition Arousal/Alertness: Awake/alert Behavior During Therapy: WFL for tasks assessed/performed Overall Cognitive Status: History of cognitive impairments - at baseline                                 General Comments: Mumbled speech. Difficult to understand.        Exercises      General Comments General comments (skin integrity, edema,  etc.): VSS on RA      Pertinent Vitals/Pain Pain Assessment Pain Assessment: Faces Faces Pain Scale: Hurts whole lot Pain Location: L elbow, L knee, L foot (all with palpation and ROM) Pain Descriptors / Indicators: Tender, Sharp, Grimacing Pain Intervention(s): Monitored during session, Repositioned, Limited activity within patient's tolerance    Home Living                          Prior Function            PT Goals (current goals can now be found in the care  plan section) Acute Rehab PT Goals Patient Stated Goal: home Progress towards PT goals: Not progressing toward goals - comment (new onset pain)    Frequency    Min 4X/week      PT Plan Current plan remains appropriate    Co-evaluation              AM-PAC PT "6 Clicks" Mobility   Outcome Measure  Help needed turning from your back to your side while in a flat bed without using bedrails?: A Little Help needed moving from lying on your back to sitting on the side of a flat bed without using bedrails?: A Lot Help needed moving to and from a bed to a chair (including a wheelchair)?: A Lot Help needed standing up from a chair using your arms (e.g., wheelchair or bedside chair)?: A Lot Help needed to walk in hospital room?: Total Help needed climbing 3-5 steps with a railing? : Total 6 Click Score: 11    End of Session Equipment Utilized During Treatment: Gait belt Activity Tolerance: Patient limited by pain Patient left: in chair;with call bell/phone within reach;with family/visitor present;with chair alarm set Nurse Communication: Mobility status PT Visit Diagnosis: Other symptoms and signs involving the nervous system (R29.898);Muscle weakness (generalized) (M62.81);Other abnormalities of gait and mobility (R26.89)     Time: 0272-5366 PT Time Calculation (min) (ACUTE ONLY): 26 min  Charges:  $Therapeutic Activity: 23-37 mins                     Aida Raider, PT  Office # [phone] Pager [phone]    Ilda Foil 07/21/2022, 10:14 AM

## 2022-07-21 NOTE — Evaluation (Signed)
Speech Language Pathology Evaluation Patient Details Name: Mitchell Washington MRN: [id number] DOB: 1951-03-02 Today's Date: 07/21/2022 Time: 1660-6301 SLP Time Calculation (min) (ACUTE ONLY): 25 min  Problem List:  Patient Active Problem List   Diagnosis Date Noted   Basilar artery stenosis with infarction (HCC) 07/18/2022   TIA (transient ischemic attack) 07/16/2022   Acute focal neurological deficit 07/15/2022   B12 deficiency 09/06/2019   Loss of memory 03/24/2018   Benign essential hypertension 01/15/2018   Cerebrovascular disease 01/15/2018   CKD (chronic kidney disease) stage 3, GFR 30-59 ml/min (HCC) 01/15/2018   Pure hypercholesterolemia 01/15/2018   Type 2 diabetes mellitus with peripheral neuropathy (HCC) 01/15/2018   Past Medical History:  Past Medical History:  Diagnosis Date   CVA (cerebral infarction)    Diabetes mellitus without complication (HCC)    Gout    Hyperlipidemia    Hypertension    Memory loss    Stroke [facility])    Wears dentures    partial upper   Past Surgical History:  Past Surgical History:  Procedure Laterality Date   APPENDECTOMY     CATARACT EXTRACTION W/PHACO Left 10/25/2019   Procedure: CATARACT EXTRACTION PHACO AND INTRAOCULAR LENS PLACEMENT (IOC) LEFT DIABETIC INTRAVEITREAL KENALOG INJECTION 1.93  00:22.6;  Surgeon: Nevada Crane, MD;  Location: [facility];  Service: Ophthalmology;  Laterality: Left;  Diabetic - oral meds   CATARACT EXTRACTION W/PHACO Right 11/15/2019   Procedure: CATARACT EXTRACTION PHACO AND INTRAOCULAR LENS PLACEMENT (IOC) RIGHT DIABETIC;  Surgeon: Nevada Crane, MD;  Location: [facility];  Service: Ophthalmology;  Laterality: Right;  0.92 0 ;19.7   IR ANGIO INTRA EXTRACRAN SEL COM CAROTID INNOMINATE BILAT MOD SED  07/16/2022   IR ANGIO VERTEBRAL SEL SUBCLAVIAN INNOMINATE UNI R MOD SED  07/16/2022   IR ANGIO VERTEBRAL SEL VERTEBRAL UNI L MOD SED  07/16/2022   RADIOLOGY WITH ANESTHESIA N/A  07/18/2022   Procedure: Cerebral angioplasty with possible stenting;  Surgeon: Julieanne Cotton, MD;  Location: [facility] OR;  Service: Radiology;  Laterality: N/A;   HPI:   Mitchell Washington is a 71 y.o. male presenting with dysarthria and left sided weakness.  MRI revealed acute pontine infarct as well as basilar artery insufficiency; multiple new, small acute infarcts within both cerebellar hemispheres, right greater than left. Basilar artery angioplasty completed on 11/30.  PMH of stroke, hypertension, hyperlipidemia, memory loss, REM sleep behavior disorder, tremor and dementia. His wife reports baseline stuttering.   Assessment / Plan / Recommendation Clinical Impression  Pt's primary barrier to communication is his significant speech dysfluency.  Speech  intelligibilty fluctuates due to atypical rate, rhythm, spund repetitions, and phonatory breaks.  His wife reports baseline "stuttering" but endorses clarity has worsened.  Cognitive and language function were briefly screened given the primacy of his motor speech deficit.  He is able to follow commands, engage in basic naming. He asked questions related to inpatient rehab and his financial concerns. Content was appropriate though intelligibility was often poor.  He was cued with basic pacing board in an effort to improve motor control, but carryover was negligible during this first session. Recommend intensive speech therapy - recommendations from OT/PT are AIR; agree this venue would be most beneficial. SLP will follow while on acute care.    SLP Assessment  SLP Recommendation/Assessment: Patient needs continued Speech Lanaguage Pathology Services SLP Visit Diagnosis: Cognitive communication deficit (R41.841)    Recommendations for follow up therapy are one component of a multi-disciplinary discharge planning  process, led by the attending physician.  Recommendations may be updated based on patient status, additional functional criteria and  insurance authorization.    Follow Up Recommendations  Acute inpatient rehab (3hours/day)    Assistance Recommended at Discharge     Functional Status Assessment Patient has had a recent decline in their functional status and demonstrates the ability to make significant improvements in function in a reasonable and predictable amount of time.  Frequency and Duration min 2x/week  2 weeks      SLP Evaluation Cognition  Overall Cognitive Status: History of cognitive impairments - at baseline Arousal/Alertness: Awake/alert Orientation Level: Oriented to person;Oriented to place;Oriented to situation;Disoriented to time Attention: Sustained Sustained Attention: Appears intact Memory:  (intact for biographical information)       Comprehension  Auditory Comprehension Overall Auditory Comprehension: Appears within functional limits for tasks assessed Visual Recognition/Discrimination Discrimination: Not tested Reading Comprehension Reading Status: Not tested    Expression Expression Primary Mode of Expression: Verbal Verbal Expression Overall Verbal Expression: Appears within functional limits for tasks assessed Written Expression Dominant Hand: Right Written Expression: Not tested   Oral / Motor  Oral Motor/Sensory Function Overall Oral Motor/Sensory Function: Other (comment) (mild left facial asymmetry) Motor Speech Overall Motor Speech: Impaired Respiration: Within functional limits Articulation: Impaired Level of Impairment: Phrase Intelligibility: Intelligibility reduced Phrase: 50-74% accurate Motor Planning: Impaired Level of Impairment: Phrase            Carolan Shiver 07/21/2022, 3:21 PM  Karmela Bram L. Samson Frederic, MA CCC/SLP Clinical Specialist - Acute Care SLP Acute Rehabilitation Services Office number [phone]

## 2022-07-21 NOTE — Progress Notes (Signed)
PROGRESS NOTE    Mitchell Washington  EXN:[id number] DOB: 1951-06-09 DOA: 07/16/2022 PCP: Kandyce Rud, MD   Brief Narrative:  Mitchell Washington is a 71 y.o. male with medical history significant of  CVA, DmII, Gout, HLD , HTN ,CKD2, dementia, who presents to Ed on 07/15/22 with complaint of intermittent unsteady gait and word finding difficulties.  Initial imaging concerning for occlusion, subsequently transferred to [facility] proper for interventional radiology/vascular surgery evaluation.    Assessment & Plan:   Principal Problem:   TIA (transient ischemic attack) Active Problems:   B12 deficiency   Benign essential hypertension   Cerebrovascular disease   Pure hypercholesterolemia   Type 2 diabetes mellitus with peripheral neuropathy (HCC)   Basilar artery stenosis with infarction (HCC)   TIA w/ small posterior CVA Recurrent CVA overnight -History of previous CVAs w/o residual defictis -CTA with findings of PCA occlusion /vertebral a occulusion mild to mod  -Stent 07/18/22 - CT head negative ,-MRI brain 11/28 remarkable for small CVA - Repeat MRI 11/30 shows multiple new acute infarcts in bilateral cerebellar hemispheres. - PT/OT/SLP to follow - will likely benefit from ongoing therapy/placement prior to discharge home -asa/plavix ongoing per neurology - OK to transfer out of ICU if ok with neuro/IRSx    Non insulin-dependent diabetes type 2, well-controlled -iss/fs  Lab Results  Component Value Date   HGBA1C 6.5 (H) 07/16/2022   Gout -no acute flare    HLD  -continue statin    HTN -strict control per neuro/IR as above -Increasing home amlodipine, lisinopril - add low dose carvedilol today   CKD2 -at baseline     Dementia, unspecified -resume home regimen    DVT prophylaxis: Heparin drip Code Status: Full Family Communication: At bedside  Status is: Inpatient  Dispo: The patient is from: Home              Anticipated d/c is to: CIR currently  recommended              Anticipated d/c date is: 48 hours              Patient currently not medically stable for discharge given need for intervention as above  Consultants:  Interventional radiology, neurology  Procedures:  As above  Antimicrobials:  None  Subjective: No acute issues or events overnight, patient appears to be improving somewhat, remains weak requiring max assist with PT recommending CIR, wife and patient agreeable at this time.  Objective: Vitals:   07/20/22 1800 07/20/22 1837 07/20/22 2329 07/21/22 0311  BP: (!) 177/79 (!) 166/65 (!) 167/92 (!) 178/74  Pulse: 67 72 88 70  Resp: (!) 28 16 17 16   Temp:  98.6 F (37 C) 99.8 F (37.7 C) 99.1 F (37.3 C)  TempSrc:  Oral Oral Oral  SpO2: 95% 100% 97% 97%  Weight:      Height:        Intake/Output Summary (Last 24 hours) at 07/21/2022 0732 Last data filed at 07/21/2022 0452 Gross per 24 hour  Intake 100 ml  Output 1770 ml  Net -1670 ml    Filed Weights   07/16/22 0143  Weight: 81.3 kg    Examination:  General:  Pleasantly resting in bed, No acute distress. HEENT: Left-sided facial droop with somewhat slurred speech but intelligible. Neck:  Without mass or deformity.  Trachea is midline. Lungs:  Clear to auscultate bilaterally without rhonchi, wheeze, or rales. Heart:  Regular rate and rhythm.  Without murmurs, rubs, or  gallops. Abdomen:  Soft, nontender, nondistended.  Without guarding or rebound. Extremities: Left-sided weakness, 2 out of 5 strength distally; sensation and strength intact proximally at the hip and shoulder.  Data Reviewed: I have personally reviewed following labs and imaging studies  CBC: Recent Labs  Lab 07/15/22 1040 07/16/22 0510 07/17/22 0656 07/18/22 0746 07/19/22 0421 07/20/22 0414 07/21/22 0235  WBC 6.0   < > 7.1 9.1 10.5 8.1 9.1  NEUTROABS 3.4  --   --   --   --   --   --   HGB 13.6   < > 13.2 12.5* 11.8* 11.4* 12.5*  HCT 42.4   < > 40.4 38.1* 34.8* 34.8*  37.7*  MCV 82.7   < > 83.1 81.9 80.6 81.9 81.1  PLT 313   < > 308 297 276 297 318   < > = values in this interval not displayed.    Basic Metabolic Panel: Recent Labs  Lab 07/16/22 0510 07/17/22 0656 07/19/22 0421 07/20/22 0414 07/21/22 0235  NA 140 139 140 141 139  K 3.8 3.7 4.0 3.6 3.7  CL 105 105 111 109 108  CO2 25 24 25 22 23   GLUCOSE 76 94 154* 130* 168*  BUN 18 12 14 15 15   CREATININE 1.60* 1.53* 1.49* 1.42* 1.38*  CALCIUM 9.1 8.6* 8.5* 8.9 8.9    GFR: Estimated Creatinine Clearance: 49.8 mL/min (A) (by C-G formula based on SCr of 1.38 mg/dL (H)). Liver Function Tests: Recent Labs  Lab 07/15/22 1040 07/16/22 0510  AST 20 13*  ALT 12 12  ALKPHOS 65 57  BILITOT 0.6 0.4  PROT 8.2* 7.2  ALBUMIN 4.2 3.6    No results for input(s): "LIPASE", "AMYLASE" in the last 168 hours. No results for input(s): "AMMONIA" in the last 168 hours. Coagulation Profile: Recent Labs  Lab 07/15/22 1040  INR 1.0    Cardiac Enzymes: No results for input(s): "CKTOTAL", "CKMB", "CKMBINDEX", "TROPONINI" in the last 168 hours. BNP (last 3 results) No results for input(s): "PROBNP" in the last 8760 hours. HbA1C: No results for input(s): "HGBA1C" in the last 72 hours.  CBG: Recent Labs  Lab 07/20/22 0748 07/20/22 1129 07/20/22 1659 07/20/22 2118 07/21/22 0608  GLUCAP 132* 173* 127* 219* 176*    Lipid Profile: No results for input(s): "CHOL", "HDL", "LDLCALC", "TRIG", "CHOLHDL", "LDLDIRECT" in the last 72 hours.  Thyroid Function Tests: No results for input(s): "TSH", "T4TOTAL", "FREET4", "T3FREE", "THYROIDAB" in the last 72 hours. Anemia Panel: No results for input(s): "VITAMINB12", "FOLATE", "FERRITIN", "TIBC", "IRON", "RETICCTPCT" in the last 72 hours. Sepsis Labs: No results for input(s): "PROCALCITON", "LATICACIDVEN" in the last 168 hours.  Recent Results (from the past 240 hour(s))  MRSA Next Gen by PCR, Nasal     Status: None   Collection Time: 07/18/22  6:32  PM   Specimen: Nasal Mucosa; Nasal Swab  Result Value Ref Range Status   MRSA by PCR Next Gen NOT DETECTED NOT DETECTED Final    Comment: (NOTE) The GeneXpert MRSA Assay (FDA approved for NASAL specimens only), is one component of a comprehensive MRSA colonization surveillance program. It is not intended to diagnose MRSA infection nor to guide or monitor treatment for MRSA infections. Test performance is not FDA approved in patients less than 27 years old. Performed at [facility], [address], [place], [address] [place]      Radiology Studies: No results found.  Scheduled Meds:  amLODipine  10 mg Oral Daily  aspirin  81 mg Oral Daily   atorvastatin  80 mg Oral Daily   Chlorhexidine Gluconate Cloth  6 each Topical Daily   cyanocobalamin  1,000 mcg Oral Daily   donepezil  10 mg Oral QHS   heparin injection (subcutaneous)  5,000 Units Subcutaneous Q8H   insulin aspart  0-9 Units Subcutaneous TID WC   lisinopril  40 mg Oral Daily   memantine  10 mg Oral BID   ticagrelor  90 mg Oral BID   Continuous Infusions:     LOS: 5 days   Time spent:  Azucena Fallen, DO [facility]  If 7PM-7AM, please contact night-coverage [email or web address]  07/21/2022, 7:32 AM

## 2022-07-22 DIAGNOSIS — I651 Occlusion and stenosis of basilar artery: Secondary | ICD-10-CM | POA: Diagnosis not present

## 2022-07-22 DIAGNOSIS — I639 Cerebral infarction, unspecified: Secondary | ICD-10-CM | POA: Diagnosis not present

## 2022-07-22 HISTORY — PX: IR ANGIO VERTEBRAL SEL VERTEBRAL UNI L MOD SED: IMG5367

## 2022-07-22 LAB — GLUCOSE, CAPILLARY
Glucose-Capillary: 178 mg/dL — ABNORMAL HIGH (ref 70–99)
Glucose-Capillary: 284 mg/dL — ABNORMAL HIGH (ref 70–99)
Glucose-Capillary: 154 mg/dL — ABNORMAL HIGH (ref 70–99)
Glucose-Capillary: 185 mg/dL — ABNORMAL HIGH (ref 70–99)

## 2022-07-22 LAB — BASIC METABOLIC PANEL
Anion gap: 7 (ref 5–15)
BUN: 29 mg/dL — ABNORMAL HIGH (ref 8–23)
CO2: 22 mmol/L (ref 22–32)
Calcium: 8.7 mg/dL — ABNORMAL LOW (ref 8.9–10.3)
Chloride: 108 mmol/L (ref 98–111)
Creatinine, Ser: 1.58 mg/dL — ABNORMAL HIGH (ref 0.61–1.24)
GFR, Estimated: 47 mL/min — ABNORMAL LOW (ref >60)
Glucose, Bld: 172 mg/dL — ABNORMAL HIGH (ref 70–99)
Potassium: 3.9 mmol/L (ref 3.5–5.1)
Sodium: 137 mmol/L (ref 135–145)

## 2022-07-22 LAB — CBC
HCT: 35 % — ABNORMAL LOW (ref 39.0–52.0)
Hemoglobin: 11.9 g/dL — ABNORMAL LOW (ref 13.0–17.0)
MCH: 27.3 pg (ref 26.0–34.0)
MCHC: 34 g/dL (ref 30.0–36.0)
MCV: 80.3 fL (ref 80.0–100.0)
Platelets: 327 10*3/uL (ref 150–400)
RBC: 4.36 MIL/uL (ref 4.22–5.81)
RDW: 13.3 % (ref 11.5–15.5)
WBC: 8 10*3/uL (ref 4.0–10.5)
nRBC: 0 % (ref 0.0–0.2)

## 2022-07-22 MED ORDER — GLIPIZIDE ER 5 MG PO TB24
5.0000 mg | ORAL_TABLET | Freq: Every day | ORAL | Status: DC
  Administered 2022-07-23 – 2022-07-24 (×2): 5 mg via ORAL
  Filled 2022-07-22 (×2): qty 1

## 2022-07-22 MED ORDER — METFORMIN HCL ER 500 MG PO TB24
500.0000 mg | ORAL_TABLET | Freq: Two times a day (BID) | ORAL | Status: DC
  Administered 2022-07-22 – 2022-07-24 (×4): 500 mg via ORAL
  Filled 2022-07-22 (×5): qty 1

## 2022-07-22 NOTE — Progress Notes (Addendum)
PROGRESS NOTE    Mitchell Washington  YQM:[id number] DOB: 1950-10-17 DOA: 07/16/2022 PCP: Kandyce Rud, MD   Brief Narrative:  Mitchell Washington is a 71 y.o. male with medical history significant of  CVA, DmII, Gout, HLD , HTN ,CKD2, dementia, who presents to Ed on 07/15/22 with complaint of intermittent unsteady gait and word finding difficulties.  Initial imaging concerning for occlusion, subsequently transferred to [facility] proper for interventional radiology/vascular surgery evaluation.    Patient medically stable for discharge, awaiting CIR availability and insurance approval.  Assessment & Plan:   Principal Problem:   TIA (transient ischemic attack) Active Problems:   B12 deficiency   Benign essential hypertension   Cerebrovascular disease   Pure hypercholesterolemia   Type 2 diabetes mellitus with peripheral neuropathy (HCC)   Basilar artery stenosis with infarction (HCC)   TIA w/ small posterior CVA Recurrent CVA overnight -History of previous CVAs w/o residual defictis -CTA with findings of PCA occlusion /vertebral a occulusion mild to mod  -Stent 07/18/22 - CT head negative ,-MRI brain 11/28 remarkable for small CVA - Repeat MRI 11/30 shows multiple new acute infarcts in bilateral cerebellar hemispheres. - PT/OT/SLP to follow - will likely benefit from ongoing therapy/placement prior to discharge home -asa/plavix ongoing per neurology - OK to transfer out of ICU if ok with neuro/IRSx    Non insulin-dependent diabetes type 2, well-controlled -iss/fs  -Resume metformin/glipizide Lab Results  Component Value Date   HGBA1C 6.5 (H) 07/16/2022   Gout -no acute flare    HLD  -continue statin    HTN -strict control per neuro/IR as above -Increasing home amlodipine, lisinopril - add low dose carvedilol today   CKD2 -at baseline     Dementia, unspecified -resume home regimen    DVT prophylaxis: Heparin drip Code Status: Full Family Communication: At  bedside  Status is: Inpatient  Dispo: The patient is from: Home              Anticipated d/c is to: CIR currently recommended              Anticipated d/c date is: 48 hours              Patient currently not medically stable for discharge given need for intervention as above  Consultants:  Interventional radiology, neurology  Procedures:  As above  Antimicrobials:  None  Subjective: No acute issues or events overnight, patient appears to be improving somewhat, remains weak requiring max assist with PT recommending CIR, wife and patient agreeable at this time.  Objective: Vitals:   07/21/22 1617 07/21/22 2248 07/22/22 0100 07/22/22 0428  BP: (!) 140/64 (!) 135/58 (!) 132/55   Pulse: 70 68    Resp: (!) 22 18 19    Temp: 98 F (36.7 C) 98 F (36.7 C) 98.2 F (36.8 C) 98.1 F (36.7 C)  TempSrc: Oral Oral Oral Oral  SpO2: 97% 98% 100% 100%  Weight:      Height:        Intake/Output Summary (Last 24 hours) at 07/22/2022 0729 Last data filed at 07/22/2022 0700 Gross per 24 hour  Intake 840 ml  Output 1225 ml  Net -385 ml    Filed Weights   07/16/22 0143  Weight: 81.3 kg    Examination:  General:  Pleasantly resting in bed, No acute distress. HEENT: Left-sided facial droop with somewhat slurred speech but intelligible. Neck:  Without mass or deformity.  Trachea is midline. Lungs:  Clear to auscultate bilaterally  without rhonchi, wheeze, or rales. Heart:  Regular rate and rhythm.  Without murmurs, rubs, or gallops. Abdomen:  Soft, nontender, nondistended.  Without guarding or rebound. Extremities: Left-sided weakness, 2 out of 5 strength distally; sensation and strength intact proximally at the hip and shoulder.  Data Reviewed: I have personally reviewed following labs and imaging studies  CBC: Recent Labs  Lab 07/15/22 1040 07/16/22 0510 07/17/22 0656 07/18/22 0746 07/19/22 0421 07/20/22 0414 07/21/22 0235  WBC 6.0   < > 7.1 9.1 10.5 8.1 9.1  NEUTROABS  3.4  --   --   --   --   --   --   HGB 13.6   < > 13.2 12.5* 11.8* 11.4* 12.5*  HCT 42.4   < > 40.4 38.1* 34.8* 34.8* 37.7*  MCV 82.7   < > 83.1 81.9 80.6 81.9 81.1  PLT 313   < > 308 297 276 297 318   < > = values in this interval not displayed.    Basic Metabolic Panel: Recent Labs  Lab 07/16/22 0510 07/17/22 0656 07/19/22 0421 07/20/22 0414 07/21/22 0235  NA 140 139 140 141 139  K 3.8 3.7 4.0 3.6 3.7  CL 105 105 111 109 108  CO2 25 24 25 22 23   GLUCOSE 76 94 154* 130* 168*  BUN 18 12 14 15 15   CREATININE 1.60* 1.53* 1.49* 1.42* 1.38*  CALCIUM 9.1 8.6* 8.5* 8.9 8.9    GFR: Estimated Creatinine Clearance: 49.8 mL/min (A) (by C-G formula based on SCr of 1.38 mg/dL (H)). Liver Function Tests: Recent Labs  Lab 07/15/22 1040 07/16/22 0510  AST 20 13*  ALT 12 12  ALKPHOS 65 57  BILITOT 0.6 0.4  PROT 8.2* 7.2  ALBUMIN 4.2 3.6    No results for input(s): "LIPASE", "AMYLASE" in the last 168 hours. No results for input(s): "AMMONIA" in the last 168 hours. Coagulation Profile: Recent Labs  Lab 07/15/22 1040  INR 1.0    Cardiac Enzymes: No results for input(s): "CKTOTAL", "CKMB", "CKMBINDEX", "TROPONINI" in the last 168 hours. BNP (last 3 results) No results for input(s): "PROBNP" in the last 8760 hours. HbA1C: No results for input(s): "HGBA1C" in the last 72 hours.  CBG: Recent Labs  Lab 07/21/22 0608 07/21/22 1147 07/21/22 1625 07/21/22 2157 07/22/22 0635  GLUCAP 176* 334* 237* 212* 178*    Lipid Profile: No results for input(s): "CHOL", "HDL", "LDLCALC", "TRIG", "CHOLHDL", "LDLDIRECT" in the last 72 hours.  Thyroid Function Tests: No results for input(s): "TSH", "T4TOTAL", "FREET4", "T3FREE", "THYROIDAB" in the last 72 hours. Anemia Panel: No results for input(s): "VITAMINB12", "FOLATE", "FERRITIN", "TIBC", "IRON", "RETICCTPCT" in the last 72 hours. Sepsis Labs: No results for input(s): "PROCALCITON", "LATICACIDVEN" in the last 168 hours.  Recent  Results (from the past 240 hour(s))  MRSA Next Gen by PCR, Nasal     Status: None   Collection Time: 07/18/22  6:32 PM   Specimen: Nasal Mucosa; Nasal Swab  Result Value Ref Range Status   MRSA by PCR Next Gen NOT DETECTED NOT DETECTED Final    Comment: (NOTE) The GeneXpert MRSA Assay (FDA approved for NASAL specimens only), is one component of a comprehensive MRSA colonization surveillance program. It is not intended to diagnose MRSA infection nor to guide or monitor treatment for MRSA infections. Test performance is not FDA approved in patients less than 51 years old. Performed at [facility], [address], [place], [address] [place]      Radiology  Studies: No results found.  Scheduled Meds:  amLODipine  10 mg Oral Daily   aspirin  81 mg Oral Daily   atorvastatin  80 mg Oral Daily   carvedilol  3.125 mg Oral BID WC   Chlorhexidine Gluconate Cloth  6 each Topical Daily   cyanocobalamin  1,000 mcg Oral Daily   donepezil  10 mg Oral QHS   heparin injection (subcutaneous)  5,000 Units Subcutaneous Q8H   insulin aspart  0-9 Units Subcutaneous TID WC   lisinopril  40 mg Oral Daily   memantine  10 mg Oral BID   ticagrelor  90 mg Oral BID   Continuous Infusions:     LOS: 6 days   Time spent:  Azucena Fallen, DO [facility]  If 7PM-7AM, please contact night-coverage [email or web address]  07/22/2022, 7:29 AM

## 2022-07-22 NOTE — Inpatient Diabetes Management (Signed)
Inpatient Diabetes Program Recommendations  AACE/ADA: New Consensus Statement on Inpatient Glycemic Control (2015)  Target Ranges:  Prepandial:   less than 140 mg/dL      Peak postprandial:   less than 180 mg/dL (1-2 hours)      Critically ill patients:  140 - 180 mg/dL    Latest Reference Range & Units 07/21/22 06:08 07/21/22 11:47 07/21/22 16:25 07/21/22 21:57  Glucose-Capillary 70 - 99 mg/dL 809 (H)  2 units Novolog  334 (H)  7 units Novolog  237 (H)  3 units Novolog  212 (H)  (H): Data is abnormally high   Latest Reference Range & Units 07/22/22 06:35 07/22/22 11:30  Glucose-Capillary 70 - 99 mg/dL 983 (H)  2 units Novolog  284 (H)  5 units Novolog   (H): Data is abnormally high   Home DM Meds: Glipizide 5 mg daily       Metformin 500 mg BID    Current Orders: Novolog 0-9 units TID    MD- Please consider adding Novolog Meal Coverage to current in-hospital insulin regimen:  Novolog 4 units TID with meals HOLD if pt NPO HOLD if pt eats <50% meals    --Will follow patient during hospitalization--  Ambrose Finland RN, MSN, CDCES Diabetes Coordinator Inpatient Glycemic Control Team Team Pager: [phone] (8a-5p)

## 2022-07-22 NOTE — Progress Notes (Signed)
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 S/p  balloon angioplasty x 2 at both levels with  persistent improved caliber to 60%.   Subjective:  Patient awake, alert, and lying on his side in chair at time of exam. States he does not want to sit up for today's exam.   Allergies: Patient has no known allergies.  Medications: Prior to Admission medications   Medication Sig Start Date End Date Taking? Authorizing Provider  amLODipine (NORVASC) 5 MG tablet Take 5 mg by mouth daily. 01/10/20  Yes [provider]  aspirin EC 81 MG tablet Take 81 mg by mouth daily.   Yes [provider]  atorvastatin (LIPITOR) 10 MG tablet Take 10 mg by mouth daily. 07/06/18  Yes [provider]  clonazePAM (KLONOPIN) 0.5 MG tablet Take 0.5 mg by mouth at bedtime as needed for anxiety. 03/17/22  Yes [provider]  [employer] VITAMIN B12 1000 MCG tablet Take 1,000 mcg by mouth daily. 09/21/18  Yes [provider]  cyanocobalamin 1000 MCG tablet Take 1 tablet by mouth daily. 08/22/20  Yes [provider]  donepezil (ARICEPT) 10 MG tablet Take 10 mg by mouth at bedtime. 08/27/18  Yes [provider]  glipiZIDE (GLUCOTROL XL) 5 MG 24 hr tablet Take 5 mg by mouth daily with breakfast. 01/03/20 07/16/22 Yes [provider]  lisinopril (ZESTRIL) 40 MG tablet Take 40 mg by mouth daily. 09/23/21  Yes [provider]  memantine (NAMENDA) 10 MG tablet Take 10 mg by mouth 2 (two) times daily. 12/20/19  Yes [provider]  metFORMIN (GLUCOPHAGE-XR) 500 MG 24 hr tablet Take 500 mg by mouth 2 (two) times daily with a meal. 01/15/18 07/16/22 Yes [provider]  famotidine (PEPCID) 20 MG tablet Take 1 tablet (20 mg total) by mouth 2 (two) times daily. Patient not taking:  Reported on 07/15/2022 01/21/21   Carrie Mew, MD  ondansetron (ZOFRAN ODT) 4 MG disintegrating tablet Take 1 tablet (4 mg total) by mouth every 8 (eight) hours as needed for nausea or vomiting. Patient not taking: Reported on 07/16/2022 01/21/21   Carrie Mew, MD     Vital Signs: BP (!) 140/63 (BP Location: Right Arm)   Pulse 60   Temp 98.3 F (36.8 C) (Oral)   Resp 19   Ht 5\' 9"  (1.753 m) Comment: per pt  Wt 179 lb 3.7 oz (81.3 kg)   SpO2 98%   BMI 26.47 kg/m   Physical Exam Vitals and nursing note reviewed.  Constitutional:      Appearance: He is well-developed.  HENT:     Head: Normocephalic.  Cardiovascular:     Rate and Rhythm: Normal rate and regular rhythm.     Comments: Right  groin access site is soft with no active bleeding and no appreciable pseudoaneurysm. Dressing has been removed before today's visit.   Pulmonary:     Effort: Pulmonary effort is normal.  Musculoskeletal:        General: Normal range of motion.     Cervical back: Normal range of motion.  Skin:    General: Skin is dry.  Neurological:     Mental Status: He is alert and oriented to person, place, and time.     Comments: Alert, aware and oriented X 3 Speech slurred. Comprehension is intact.  PERRL bilaterally Left facial droop  noted Tongue midline Can spontaneously move all 4 extremities. Left upper arm with drift. Able to move left leg Fine motor and coordination intact.  Distal pulses (DP's) palpable bilaterally with Doppler Speech, cognition and language are generally intact.  Judgment and insight normal  Gait not assessed Romberg not assessed Heel to toe not assessed Distal pulses not assessed      Imaging: MR ANGIO HEAD WO CONTRAST  Result Date: 07/19/2022 CLINICAL DATA:  Stroke follow-up EXAM: MRI HEAD WITHOUT CONTRAST MRA HEAD WITHOUT CONTRAST TECHNIQUE: Multiplanar, multi-echo pulse sequences of the brain and surrounding structures were acquired without intravenous  contrast. Angiographic images of the Circle of Willis were acquired using MRA technique without intravenous contrast. 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 These are all small. 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 There is multifocal hyperintense T2-weighted signal within the white matter. Generalized volume loss. 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 The midline structures are normal. Vascular: Major flow voids are preserved. Skull and upper cervical spine: Normal calvarium and skull base. Visualized upper cervical spine and soft tissues are normal. Sinuses/Orbits:No paranasal sinus fluid levels or advanced mucosal thickening. No mastoid or middle ear effusion. Normal orbits. MRA HEAD FINDINGS POSTERIOR CIRCULATION: --Vertebral arteries: Normal --Inferior cerebellar arteries: Normal. --Basilar artery: Normal. --Superior cerebellar arteries: Normal. --Posterior cerebral arteries: Unchanged bilateral P1 occlusion. ANTERIOR CIRCULATION: --Intracranial internal carotid arteries: Normal. --Anterior cerebral arteries (ACA): Normal. Hypoplastic left A1 segment, normal variant --Middle cerebral arteries (MCA): Mild atherosclerotic irregularity of both M1 segments. ANATOMIC VARIANTS: None IMPRESSION: 1. Multiple new, small acute infarcts within both cerebellar hemispheres, right greater than left. 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 2. Unchanged bilateral P1 occlusions. No new intracranial occlusion. Electronically Signed   By: Ulyses Jarred M.D.   On: 07/19/2022 01:43   MR BRAIN WO CONTRAST  Result Date: 07/19/2022 CLINICAL DATA:  Stroke follow-up EXAM: MRI HEAD WITHOUT CONTRAST MRA HEAD WITHOUT CONTRAST TECHNIQUE: Multiplanar, multi-echo pulse sequences of the brain and surrounding  structures were acquired without intravenous contrast. Angiographic images of the Circle of Willis were acquired using MRA technique without intravenous contrast. 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 These are all small. 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 There is multifocal hyperintense T2-weighted signal within the white matter. Generalized volume loss. 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 The midline structures are normal. Vascular: Major flow voids are preserved. Skull and upper cervical spine: Normal calvarium and skull base. Visualized upper cervical spine and soft tissues are normal. Sinuses/Orbits:No paranasal sinus fluid levels or advanced mucosal thickening. No mastoid or middle ear effusion. Normal orbits. MRA HEAD FINDINGS POSTERIOR CIRCULATION: --Vertebral arteries: Normal --Inferior cerebellar arteries: Normal. --Basilar artery: Normal. --Superior cerebellar arteries: Normal. --Posterior cerebral arteries: Unchanged bilateral P1 occlusion. ANTERIOR CIRCULATION: --Intracranial internal carotid arteries: Normal. --Anterior cerebral arteries (ACA): Normal. Hypoplastic left A1 segment, normal variant --Middle cerebral arteries (MCA): Mild atherosclerotic irregularity of both M1 segments. ANATOMIC VARIANTS: None IMPRESSION: 1. Multiple new, small acute infarcts within both cerebellar hemispheres, right greater than left. 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 2. Unchanged bilateral P1 occlusions. No new intracranial occlusion. Electronically Signed   By: Cletus Gash.D.  On: 07/19/2022 01:43    Labs:  CBC: Recent Labs    07/19/22 0421 07/20/22 0414 07/21/22 0235 07/22/22 0648  WBC 10.5 8.1 9.1 8.0  HGB 11.8* 11.4* 12.5* 11.9*  HCT 34.8* 34.8* 37.7* 35.0*   PLT 276 297 318 327     COAGS: Recent Labs    07/15/22 1040  INR 1.0  APTT 28     BMP: Recent Labs    07/19/22 0421 07/20/22 0414 07/21/22 0235 07/22/22 0648  NA 140 141 139 137  K 4.0 3.6 3.7 3.9  CL 111 109 108 108  CO2 25 22 23 22   GLUCOSE 154* 130* 168* 172*  BUN 14 15 15  29*  CALCIUM 8.5* 8.9 8.9 8.7*  CREATININE 1.49* 1.42* 1.38* 1.58*  GFRNONAA 50* 53* 55* 47*     LIVER FUNCTION TESTS: Recent Labs    07/15/22 1040 07/16/22 0510  BILITOT 0.6 0.4  AST 20 13*  ALT 12 12  ALKPHOS 65 57  PROT 8.2* 7.2  ALBUMIN 4.2 3.6     Assessment and Plan:  71 y.o. male inpatient History of  Gout, DM, HLD, HTN, CVA ( no residual deficits (Presented to the ED at [facility] on 11.27.23  with generalized weakness and disequilibrium. CT showed significant posterior circulation infarcts with bilateral PCA occlusions. Patient was started on Plavix and ASA and transferred to [facility] for further evaluation and intervention. Cerebral arteriogram performed on 11.28.23 by Dr. 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 S/p  balloon angioplasty x 2 at both levels with  persistent improved caliber to 60%. MR brain from 12.1.23 read multiple new, small acute infarcts within both cerebellar hemispheres, right greater than left. 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 Unchanged bilateral P1 occlusions. No new intracranial occlusion.   Patient awake and alert at time of exam. Pulses able to be detected with doppler. Persistent but improving slurred speech. Left facial droop also improving. Patient is able to bend left knee. 3/5 Left arm with drift. 3/5 left lower extremity strength. Right  groin access site is soft with no active bleeding and no appreciable pseudoaneurysm.   Patient to be seen in [facility]. Follow up order placed. IR scheduler will call with appointment date and time. 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 S/p  balloon angioplasty x 2 at both levels with  persistent improved caliber to 60%.

## 2022-07-22 NOTE — Progress Notes (Signed)
Mobility Specialist: Progress Note   07/22/22 1605  Mobility  Activity Transferred from chair to bed  Level of Assistance +2 (takes two people)  Assistive Device Other (Comment) (HHA)  Distance Ambulated (ft) 2 ft  Activity Response Tolerated well  Mobility Referral Yes  $Mobility charge 1 Mobility   Pt received slid down in the chair and assisted back to bed with NT. ModA to stand and weight shift to take steps to the bed. C/o L knee pain, no rating given. Pt back in bed with call bell and phone at his side.   Jhanae Jaskowiak Mobility Specialist Please contact via SecureChat or Rehab office at [phone]

## 2022-07-22 NOTE — Progress Notes (Signed)
  Inpatient Rehabilitation Admissions Coordinator   Met with patient at bedside and then contacted his wife by phone for rehab assessment. We discussed goals and expectations of a possible CIR admit. Patient with history of dementia, but able to care for self with mobility and adls. Wife prefers Cir admit even though patient feels he can just return home. She and family can provide expected caregiver support that is recommended . I will begin insurance Auth with [employer] for possible CIR admit pending approval. Please call me with any questions.   Danne Baxter, RN, MSN Rehab Admissions Coordinator [phone]

## 2022-07-22 NOTE — Progress Notes (Signed)
Physical Therapy Treatment Patient Details Name: Mitchell Washington MRN: [id number] DOB: [birth year] Today's Date: 07/22/2022   History of Present Illness [name] is a 71 y.o. male with history of stroke, hypertension, hyperlipidemia, memory loss, REM sleep behavior disorder, tremor, and dementia presenting with dysarthria and left sided weakness.  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  Basilar artery angioplasty completed on 11/30.  PMH of stroke, hypertension, hyperlipidemia, memory loss, REM sleep behavior disorder, tremor and dementia.    PT Comments    Patient seen in co-treatment with OT to attempt to advance mobility with 2 person assist. Patient initially wanting to finish his breakfast, and agreed to do this sitting at the EOB. Able to maintain midline balance x ~8 minutes while attending to breakfast. (See OT note). Progressed to working on standing and transfers as assisted from bed to [facility] to recliner (see below for details). Continues to be primarily limited by Lt knee pain, however left hemiparesis also an issue. AIR continues to be the recommendation for discharge plan.     Recommendations for follow up therapy are one component of a multi-disciplinary discharge planning process, led by the attending physician.  Recommendations may be updated based on patient status, additional functional criteria and insurance authorization.  Follow Up Recommendations  Acute inpatient rehab (3hours/day)     Assistance Recommended at Discharge Frequent or constant Supervision/Assistance  Patient can return home with the following A little help with walking and/or transfers;A little help with bathing/dressing/bathroom;Assist for transportation;Help with stairs or ramp for entrance;Assistance with cooking/housework   Equipment Recommendations  Other (comment) (TBD)    Recommendations for Other Services       Precautions / Restrictions  Precautions Precautions: Fall Precaution Comments: moderate left hemiparesis Restrictions Weight Bearing Restrictions: No     Mobility  Bed Mobility Overal bed mobility: Needs Assistance Bed Mobility: Supine to Sit     Supine to sit: Mod assist, HOB elevated     General bed mobility comments: cues for sequencing, increased time    Transfers Overall transfer level: Needs assistance Equipment used: None, Rolling walker (2 wheels) Transfers: Sit to/from Stand, Bed to chair/wheelchair/BSC Sit to Stand: Mod assist, +2 physical assistance, Max assist   Step pivot transfers: Mod assist, +2 physical assistance       General transfer comment: initial transfer to [facility] on his right without device with +2 max to stand and +2 mod to pivot-step around to [facility]; from [facility] to chair on his left, used RW and pt more upright and better able to advance LLE as he step-pivoted    Ambulation/Gait               General Gait Details: unable due to LLE pain and weakness; pivotal steps only   Stairs             Wheelchair Mobility    Modified Rankin (Stroke Patients Only) Modified Rankin (Stroke Patients Only) Pre-Morbid Rankin Score: No symptoms Modified Rankin: Moderately severe disability     Balance Overall balance assessment: Needs assistance Sitting-balance support: Feet supported, Single extremity supported Sitting balance-Leahy Scale: Poor Sitting balance - Comments: closeguarding assist, but no physical assist; slight left lean   Standing balance support: Bilateral upper extremity supported Standing balance-Leahy Scale: Poor Standing balance comment: pt reliant on external support  Cognition Arousal/Alertness: Awake/alert Behavior During Therapy: WFL for tasks assessed/performed Overall Cognitive Status: No family/caregiver present to determine baseline cognitive functioning                                  General Comments: Difficult to understand speech, but pt can repeat slower and louder and usually able to understand; pt initially stating he does not want to try standing and there was confusion as to why (stating he wans to give up, then stating he wants to regain independence); ultimately wanted to work with therapies        Exercises      General Comments        Pertinent Vitals/Pain Pain Assessment Pain Assessment: Faces Faces Pain Scale: Hurts little more Pain Location: L elbow, L knee, Pain Descriptors / Indicators: Sharp, Grimacing Pain Intervention(s): Limited activity within patient's tolerance, Monitored during session    Home Living                          Prior Function            PT Goals (current goals can now be found in the care plan section) Acute Rehab PT Goals Patient Stated Goal: home Time For Goal Achievement: 08/02/22 Potential to Achieve Goals: Fair Progress towards PT goals: Progressing toward goals    Frequency    Min 4X/week      PT Plan Current plan remains appropriate    Co-evaluation PT/OT/SLP Co-Evaluation/Treatment: Yes Reason for Co-Treatment: Complexity of the patient's impairments (multi-system involvement);For patient/therapist safety;To address functional/ADL transfers PT goals addressed during session: Mobility/safety with mobility;Balance;Proper use of DME        AM-PAC PT "6 Clicks" Mobility   Outcome Measure  Help needed turning from your back to your side while in a flat bed without using bedrails?: A Little Help needed moving from lying on your back to sitting on the side of a flat bed without using bedrails?: A Lot Help needed moving to and from a bed to a chair (including a wheelchair)?: A Lot Help needed standing up from a chair using your arms (e.g., wheelchair or bedside chair)?: Total Help needed to walk in hospital room?: Total Help needed climbing 3-5 steps with a railing? : Total 6 Click  Score: 10    End of Session Equipment Utilized During Treatment: Gait belt Activity Tolerance: Patient limited by pain Patient left: in chair;with call bell/phone within reach;with chair alarm set Nurse Communication: Mobility status PT Visit Diagnosis: Other symptoms and signs involving the nervous system (R29.898);Muscle weakness (generalized) (M62.81);Other abnormalities of gait and mobility (R26.89)     Time: 7371-0626 PT Time Calculation (min) (ACUTE ONLY): 44 min  Charges:  $Therapeutic Activity: 8-22 mins                      Jerolyn Center, PT Acute Rehabilitation Services  Office [phone]    Zena Amos 07/22/2022, 11:22 AM

## 2022-07-22 NOTE — TOC Progression Note (Signed)
Transition of Care [facility]) - Progression Note    Patient Details  Name: Mitchell Washington MRN: [id number] Date of Birth: 06-14-1951  Transition of Care [facility]) CM/SW Contact  Kermit Balo, RN Phone Number: 07/22/2022, 11:39 AM  Clinical Narrative:    CIR to eval for potential rehab admission.  TOC following.        Expected Discharge Plan and Services                                                 Social Determinants of Health (SDOH) Interventions    Readmission Risk Interventions     No data to display

## 2022-07-22 NOTE — Progress Notes (Signed)
Occupational Therapy Treatment Patient Details Name: Mitchell Washington MRN: [id number] DOB: [birth year] Today's Date: 07/22/2022   History of present illness Mr. AMRON GUERRETTE is a 71 y.o. male with history of stroke, hypertension, hyperlipidemia, memory loss, REM sleep behavior disorder, tremor, and dementia presenting with dysarthria and left sided weakness.  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  Basilar artery angioplasty completed on 11/30.  PMH of stroke, hypertension, hyperlipidemia, memory loss, REM sleep behavior disorder, tremor and dementia.   OT comments  Patient continues to make steady progress towards goals in skilled OT session. Patient's session encompassed co-treat with PT to advance towards functional mobility with two skilled sets of hands. Patients cognition waxing and waning in session (noted dementia at baseline) sometimes implying he didn't need any help, then stating since he wasn't at his baseline he "might as well be done and get it over with." Patient with improved sitting balance EOB to eat breakfast, however mod to max A of 2 to come into standing, and then mod A of 2 to complete stand pivot to [facility] Patient improving in mobility with use of RW to pivot to recliner at end of session. OT continues to recommend AIR placement; will continue to follow.    Recommendations for follow up therapy are one component of a multi-disciplinary discharge planning process, led by the attending physician.  Recommendations may be updated based on patient status, additional functional criteria and insurance authorization.    Follow Up Recommendations  Acute inpatient rehab (3hours/day)     Assistance Recommended at Discharge Frequent or constant Supervision/Assistance  Patient can return home with the following  A little help with walking and/or transfers;A little help with bathing/dressing/bathroom;Assistance with cooking/housework;Direct  supervision/assist for financial management;Assist for transportation;Help with stairs or ramp for entrance;Direct supervision/assist for medications management   Equipment Recommendations  Other (comment) (Defer to next venue)    Recommendations for Other Services      Precautions / Restrictions Precautions Precautions: Fall Precaution Comments: moderate left hemiparesis Restrictions Weight Bearing Restrictions: No       Mobility Bed Mobility Overal bed mobility: Needs Assistance Bed Mobility: Supine to Sit     Supine to sit: Mod assist, HOB elevated     General bed mobility comments: cues for sequencing, increased time    Transfers Overall transfer level: Needs assistance Equipment used: None, Rolling walker (2 wheels) Transfers: Sit to/from Stand, Bed to chair/wheelchair/BSC Sit to Stand: Mod assist, +2 physical assistance, Max assist     Step pivot transfers: Mod assist, +2 physical assistance     General transfer comment: initial transfer to [facility] on his right without device with +2 max to stand and +2 mod to pivot-step around to [facility]; from [facility] to chair on his left, used RW and pt more upright and better able to advance LLE as he step-pivoted     Balance Overall balance assessment: Needs assistance Sitting-balance support: Feet supported, Single extremity supported Sitting balance-Leahy Scale: Poor Sitting balance - Comments: closeguarding assist, but no physical assist; slight left lean   Standing balance support: Bilateral upper extremity supported Standing balance-Leahy Scale: Poor Standing balance comment: pt reliant on external support                           ADL either performed or assessed with clinical judgement   ADL Overall ADL's : Needs assistance/impaired Eating/Feeding: Supervision/ safety;Sitting Eating/Feeding Details (indicate cue type and reason):  RUE only                     Toilet Transfer: Moderate assistance;+2 for  physical assistance;+2 for safety/equipment;Stand-pivot   Toileting- Clothing Manipulation and Hygiene: Total assistance;Sitting/lateral lean;Sit to/from stand Toileting - Clothing Manipulation Details (indicate cue type and reason): unable to complete peri-care in standing     Functional mobility during ADLs: Moderate assistance;+2 for physical assistance;+2 for safety/equipment;Cueing for safety;Cueing for sequencing;Rolling walker (2 wheels) General ADL Comments: Session focus on ADLs, transfers, and inceasing overall independence    Extremity/Trunk Assessment              Vision       Perception     Praxis      Cognition Arousal/Alertness: Awake/alert Behavior During Therapy: WFL for tasks assessed/performed Overall Cognitive Status: No family/caregiver present to determine baseline cognitive functioning                                 General Comments: Difficult to understand speech, but pt can repeat slower and louder and usually able to understand; pt initially stating he does not want to try standing and there was confusion as to why (stating he wans to give up, then stating he wants to regain independence); ultimately wanted to work with therapies        Exercises      Shoulder Instructions       General Comments      Pertinent Vitals/ Pain       Pain Assessment Faces Pain Scale: Hurts little more Pain Location: L elbow, L knee, Pain Descriptors / Indicators: Sharp, Grimacing  Home Living                                          Prior Functioning/Environment              Frequency  Min 2X/week        Progress Toward Goals  OT Goals(current goals can now be found in the care plan section)  Progress towards OT goals: Progressing toward goals  Acute Rehab OT Goals Patient Stated Goal: to get better OT Goal Formulation: With patient Time For Goal Achievement: 08/02/22 Potential to Achieve Goals: Good  Plan  Discharge plan remains appropriate    Co-evaluation                 AM-PAC OT "6 Clicks" Daily Activity     Outcome Measure   Help from another person eating meals?: A Little Help from another person taking care of personal grooming?: A Lot Help from another person toileting, which includes using toliet, bedpan, or urinal?: A Lot Help from another person bathing (including washing, rinsing, drying)?: A Lot Help from another person to put on and taking off regular upper body clothing?: A Lot Help from another person to put on and taking off regular lower body clothing?: A Lot 6 Click Score: 13    End of Session Equipment Utilized During Treatment: Gait belt;Rolling walker (2 wheels)  OT Visit Diagnosis: Unsteadiness on feet (R26.81);Other abnormalities of gait and mobility (R26.89);Muscle weakness (generalized) (M62.81);Hemiplegia and hemiparesis Hemiplegia - Right/Left: Left Hemiplegia - dominant/non-dominant: Non-Dominant Hemiplegia - caused by: Cerebral infarction   Activity Tolerance Patient tolerated treatment well   Patient Left in chair;with call bell/phone within  reach;with chair alarm set   Nurse Communication Mobility status        Time: 1950-9326 OT Time Calculation (min): 44 min  Charges: OT General Charges $OT Visit: 1 Visit OT Treatments $Self Care/Home Management : 23-37 mins  Pollyann Glen E. Sharlie Shreffler, OTR/L Acute Rehabilitation Services [phone]   Cherlyn Cushing 07/22/2022, 4:15 PM

## 2022-07-23 LAB — BASIC METABOLIC PANEL
Anion gap: 8 (ref 5–15)
BUN: 29 mg/dL — ABNORMAL HIGH (ref 8–23)
CO2: 23 mmol/L (ref 22–32)
Calcium: 9 mg/dL (ref 8.9–10.3)
Chloride: 108 mmol/L (ref 98–111)
Creatinine, Ser: 1.56 mg/dL — ABNORMAL HIGH (ref 0.61–1.24)
GFR, Estimated: 47 mL/min — ABNORMAL LOW (ref >60)
Glucose, Bld: 175 mg/dL — ABNORMAL HIGH (ref 70–99)
Potassium: 4.2 mmol/L (ref 3.5–5.1)
Sodium: 139 mmol/L (ref 135–145)

## 2022-07-23 LAB — GLUCOSE, CAPILLARY
Glucose-Capillary: 153 mg/dL — ABNORMAL HIGH (ref 70–99)
Glucose-Capillary: 256 mg/dL — ABNORMAL HIGH (ref 70–99)
Glucose-Capillary: 167 mg/dL — ABNORMAL HIGH (ref 70–99)
Glucose-Capillary: 146 mg/dL — ABNORMAL HIGH (ref 70–99)

## 2022-07-23 LAB — CBC
HCT: 35.6 % — ABNORMAL LOW (ref 39.0–52.0)
Hemoglobin: 11.9 g/dL — ABNORMAL LOW (ref 13.0–17.0)
MCH: 27.2 pg (ref 26.0–34.0)
MCHC: 33.4 g/dL (ref 30.0–36.0)
MCV: 81.3 fL (ref 80.0–100.0)
Platelets: 370 10*3/uL (ref 150–400)
RBC: 4.38 MIL/uL (ref 4.22–5.81)
RDW: 13.2 % (ref 11.5–15.5)
WBC: 7.3 10*3/uL (ref 4.0–10.5)
nRBC: 0 % (ref 0.0–0.2)

## 2022-07-23 MED ORDER — AMLODIPINE BESYLATE 10 MG PO TABS: 10.0000 mg | ORAL_TABLET | Freq: Every day | ORAL | 1 refills | Status: DC

## 2022-07-23 MED ORDER — TICAGRELOR 90 MG PO TABS: 90.0000 mg | ORAL_TABLET | Freq: Two times a day (BID) | ORAL | 0 refills | Status: DC

## 2022-07-23 MED ORDER — CARVEDILOL 3.125 MG PO TABS: 3.1250 mg | ORAL_TABLET | Freq: Two times a day (BID) | ORAL | 1 refills | Status: DC

## 2022-07-23 NOTE — Plan of Care (Signed)
Problem: Education: Goal: Knowledge of General Education information will improve Description: Including pain rating scale, medication(s)/side effects and non-pharmacologic comfort measures Outcome: Progressing   Problem: Health Behavior/Discharge Planning: Goal: Ability to manage health-related needs will improve Outcome: Progressing   Problem: Clinical Measurements: Goal: Ability to maintain clinical measurements within normal limits will improve Outcome: Progressing Goal: Will remain free from infection Outcome: Progressing Goal: Diagnostic test results will improve Outcome: Progressing Goal: Respiratory complications will improve Outcome: Progressing Goal: Cardiovascular complication will be avoided Outcome: Progressing   Problem: Activity: Goal: Risk for activity intolerance will decrease Outcome: Progressing   Problem: Nutrition: Goal: Adequate nutrition will be maintained Outcome: Progressing   Problem: Coping: Goal: Level of anxiety will decrease Outcome: Progressing   Problem: Elimination: Goal: Will not experience complications related to bowel motility Outcome: Progressing Goal: Will not experience complications related to urinary retention Outcome: Progressing   Problem: Pain Managment: Goal: General experience of comfort will improve Outcome: Progressing   Problem: Safety: Goal: Ability to remain free from injury will improve Outcome: Progressing   Problem: Skin Integrity: Goal: Risk for impaired skin integrity will decrease Outcome: Progressing   Problem: Education: Goal: Knowledge of disease or condition will improve Outcome: Progressing Goal: Knowledge of secondary prevention will improve (MUST DOCUMENT ALL) Outcome: Progressing Goal: Knowledge of patient specific risk factors will improve Loraine Leriche N/A or DELETE if not current risk factor) Outcome: Progressing   Problem: Ischemic Stroke/TIA Tissue Perfusion: Goal: Complications of ischemic  stroke/TIA will be minimized Outcome: Progressing   Problem: Coping: Goal: Will verbalize positive feelings about self Outcome: Progressing Goal: Will identify appropriate support needs Outcome: Progressing   Problem: Health Behavior/Discharge Planning: Goal: Ability to manage health-related needs will improve Outcome: Progressing Goal: Goals will be collaboratively established with patient/family Outcome: Progressing   Problem: Self-Care: Goal: Ability to participate in self-care as condition permits will improve Outcome: Progressing Goal: Verbalization of feelings and concerns over difficulty with self-care will improve Outcome: Progressing Goal: Ability to communicate needs accurately will improve Outcome: Progressing   Problem: Nutrition: Goal: Risk of aspiration will decrease Outcome: Progressing Goal: Dietary intake will improve Outcome: Progressing   Problem: Education: Goal: Ability to describe self-care measures that may prevent or decrease complications (Diabetes Survival Skills Education) will improve Outcome: Progressing Goal: Individualized Educational Video(s) Outcome: Progressing   Problem: Coping: Goal: Ability to adjust to condition or change in health will improve Outcome: Progressing   Problem: Fluid Volume: Goal: Ability to maintain a balanced intake and output will improve Outcome: Progressing   Problem: Health Behavior/Discharge Planning: Goal: Ability to identify and utilize available resources and services will improve Outcome: Progressing Goal: Ability to manage health-related needs will improve Outcome: Progressing   Problem: Metabolic: Goal: Ability to maintain appropriate glucose levels will improve Outcome: Progressing   Problem: Nutritional: Goal: Maintenance of adequate nutrition will improve Outcome: Progressing Goal: Progress toward achieving an optimal weight will improve Outcome: Progressing   Problem: Skin  Integrity: Goal: Risk for impaired skin integrity will decrease Outcome: Progressing   Problem: Tissue Perfusion: Goal: Adequacy of tissue perfusion will improve Outcome: Progressing   Problem: Education: Goal: Understanding of CV disease, CV risk reduction, and recovery process will improve Outcome: Progressing Goal: Individualized Educational Video(s) Outcome: Progressing   Problem: Activity: Goal: Ability to return to baseline activity level will improve Outcome: Progressing   Problem: Cardiovascular: Goal: Ability to achieve and maintain adequate cardiovascular perfusion will improve Outcome: Progressing Goal: Vascular access site(s) Level 0-1 will be maintained Outcome: Progressing  Problem: Health Behavior/Discharge Planning: Goal: Ability to safely manage health-related needs after discharge will improve Outcome: Progressing   

## 2022-07-23 NOTE — Care Management Important Message (Signed)
Important Message  Patient Details  Name: Mitchell Washington MRN: [id number] Date of Birth: March 21, 1951   Medicare Important Message Given:  Yes     Dajane Valli 07/23/2022, 3:26 PM

## 2022-07-23 NOTE — Progress Notes (Signed)
Physician Discharge Summary  Martell Mcfadyen Tellefsen PYP:[id number] DOB: [birth year] DOA: 07/16/2022  PCP: Kandyce Rud, MD  Admit date: 07/16/2022 Discharge date: 07/23/2022  Admitted From: Home Disposition:  CIR  Recommendations for Outpatient Follow-up:  Follow up with PCP in 1-2 weeks Follow up with Neuro as scheduled  Discharge Condition:Stable  CODE STATUS:Full  Diet recommendation:  Low salt low fat diabetic diet  Brief/Interim Summary: AXYL SITZMAN is a 71 y.o. male with medical history significant of  CVA, DmII, Gout, HLD , HTN ,CKD2, dementia, who presents to [facility] on 07/15/22 with complaint of intermittent unsteady gait and word finding difficulties.  Initial imaging concerning for occlusion, subsequently transferred to [facility] proper for interventional radiology/vascular surgery evaluation.     Patient medically stable for discharge, awaiting CIR availability and insurance approval.  Discharge Diagnoses:  Principal Problem:   TIA (transient ischemic attack) Active Problems:   B12 deficiency   Benign essential hypertension   Cerebrovascular disease   Pure hypercholesterolemia   Type 2 diabetes mellitus with peripheral neuropathy (HCC)   Basilar artery stenosis with infarction (HCC)  TIA w/ small posterior CVA Recurrent CVA overnight -History of previous CVAs w/o residual defictis -CTA with findings of PCA occlusion /vertebral a occulusion mild to mod  -Stent 07/18/22 - CT head negative ,-MRI brain 11/28 remarkable for small CVA - Repeat MRI 11/30 shows multiple new acute infarcts in bilateral cerebellar hemispheres. - PT/OT/SLP to follow - will likely benefit from ongoing therapy/placement prior to discharge home -asa/plavix ongoing per neurology - OK to transfer out of ICU if ok with neuro/IRSx     Non insulin-dependent diabetes type 2, well-controlled -A1c 6.5 -Continue diabetic diet and previous dose of metformin/glipizide  Gout -no acute flare    HLD   -continue statin    HTN -strict control per neuro/IR as above -Increasing home amlodipine, lisinopril - add low dose carvedilol today   CKD2 -at baseline     Dementia, unspecified -resume home regimen    Discharge Instructions   Allergies as of 07/23/2022   No Known Allergies      Medication List     STOP taking these medications    clonazePAM 0.5 MG tablet Commonly known as: KLONOPIN       TAKE these medications    amLODipine 10 MG tablet Commonly known as: NORVASC Take 1 tablet (10 mg total) by mouth daily. What changed:  medication strength how much to take   aspirin EC 81 MG tablet Take 81 mg by mouth daily.   atorvastatin 10 MG tablet Commonly known as: LIPITOR Take 10 mg by mouth daily.   carvedilol 3.125 MG tablet Commonly known as: COREG Take 1 tablet (3.125 mg total) by mouth 2 (two) times daily with a meal.   [employer] VITAMIN B12 1000 MCG tablet Generic drug: cyanocobalamin Take 1,000 mcg by mouth daily.   cyanocobalamin 1000 MCG tablet Take 1 tablet by mouth daily.   donepezil 10 MG tablet Commonly known as: ARICEPT Take 10 mg by mouth at bedtime.   glipiZIDE 5 MG 24 hr tablet Commonly known as: GLUCOTROL XL Take 5 mg by mouth daily with breakfast.   lisinopril 40 MG tablet Commonly known as: ZESTRIL Take 40 mg by mouth daily.   memantine 10 MG tablet Commonly known as: NAMENDA Take 10 mg by mouth 2 (two) times daily.   metFORMIN 500 MG 24 hr tablet Commonly known as: GLUCOPHAGE-XR Take 500 mg by mouth 2 (two) times daily with a meal.  ticagrelor 90 MG Tabs tablet Commonly known as: BRILINTA Take 1 tablet (90 mg total) by mouth 2 (two) times daily.        No Known Allergies  Consultations: Neurology, interventional radiology  Procedures/Studies: IR PTA Intracranial  Result Date: 07/22/2022 CLINICAL DATA:  Severe vertebrobasilar ischemia secondary to tandem severe stenosis of the mid basilar artery, and the dominant  left vertebrobasilar junction distally. Previous history of bilateral occipital infarcts. EXAM: PTA INTRACRANIAL COMPARISON:  Recent CT angiogram of the head and neck, and MRI of the brain. MEDICATIONS: Heparin 4,000 units IV. Ancef 2 g IV antibiotic was administered within 1 hour of the procedure. ANESTHESIA/SEDATION: General anesthesia. CONTRAST:  Omnipaque 300 approximately 80 cc. FLUOROSCOPY TIME:  FLUOROSCOPY TIME Fluoroscopy Time: 29 minutes 24 seconds (2813 mGy). COMPLICATIONS: None immediate. TECHNIQUE: Informed written consent was obtained from the patient after a thorough discussion of the procedural risks, benefits and alternatives. All questions were addressed. Maximal Sterile Barrier Technique was utilized including caps, mask, sterile gowns, sterile gloves, sterile drape, hand hygiene and skin antiseptic. A timeout was performed prior to the initiation of the procedure. The right groin was prepped and draped in the usual sterile fashion. Thereafter using modified Seldinger technique, transfemoral access into the right common femoral artery was obtained without difficulty. Over a 0.035 inch guidewire, an 8 French 15 cm Pinnacle sheath was inserted. Through this, and also over 0.035 inch guidewire, a 5 [place] JB 1 catheter was advanced to the aortic arch region and selectively positioned in the distal left subclavian artery. FINDINGS: The left vertebral artery origin demonstrates approximately 30% stenosis at its origin. More distally, the vessel is seen to opacify to the cranial skull base. The left vertebrobasilar junction proximal just distal to the left posterior-inferior cerebellar artery demonstrates minimal stenosis. Severe focal stenosis again demonstrated of the distal left vertebrobasilar junction at its confluence with the proximal basilar artery. Distal to this the proximal basilar artery appears patent to the level of the anterior-inferior cerebellar arteries. Distal to this there is a  severe pre occlusive stenosis of the mid basilar artery. Distal to this the basilar artery caliber improves to near normal. Distal focal narrowing is seen of the distal basilar artery at the level of the superior cerebellar arteries. There is incomplete irregular opacification of the proximal P1 segments secondary to severe intracranial arteriosclerosis. Stenosis is noted focally at the origin of the left superior cerebellar artery, and the left anterior-inferior cerebellar artery. Partial retrograde opacification of the right vertebrobasilar junction is noted to the level of the right posterior-inferior cerebellar artery. High-grade stenosis is again demonstrated of the distal right vertebrobasilar junction proximal to the basilar artery. ENDOVASCULAR REVASCULARIZATION OF THE MID BASILAR ARTERY STENOSIS AND OF THE LEFT VERTEBROBASILAR JUNCTION AT THE LEVEL OF THE PROXIMAL BASILAR ARTERY WITH BALLOON ANGIOPLASTY. The JB 1 diagnostic catheter in the distal left subclavian artery was then exchanged over a 0.035 inch 300 cm Rosen exchange guidewire for a 95 cm 071 Benchmark guide catheter with a 125 cm Bernstein catheter. The guidewire was removed. Good aspiration was obtained at the hub of the [place] support catheter. An 035 inch Roadrunner guidewire was then advanced through the support catheter and advanced distally into the vertebral artery followed by the support catheter followed by the Benchmark guide catheter. The guidewire, and the support catheter were removed. Good aspiration was obtained from the hub of the Benchmark guide catheter. A control arteriogram performed through this demonstrated no evidence of spasms, dissections or of intraluminal  filling defects. Again demonstrated was the high-grade stenosis of the basilar artery in the left vertebrobasilar junction. Over an 014 inch soft tip micro guidewire, an 044 Phenom 120 cm guide catheter was then advanced distal to the Benchmark guide catheter and  positioned in the V4 segment of the left vertebral artery. The guidewire was removed. Gentle control arteriogram performed through the Phenom catheter demonstrated no changes in the intracranial circulation. At this time, a 1.5 mm x 15 mm Gateway angioplasty balloon catheter which had been prepped with 75% contrast and 25% heparinized saline infusion was then advanced over an 014 inch Aristotle soft tip micro guidewire to the distal end of the Phenom catheter. The guidewire was then gently manipulated without any difficulty through the left vertebrobasilar stenosis and the tight mid basilar artery stenosis of the distal basilar artery. Balloon was then advanced to the position initially across the mid basilar artery stenosis and then the proximal left vertebrobasilar artery junction stenosis. At each site, balloon was inflated using micro inflation syringe device via micro tubing in a progressively slow fashion to approximately 5 atmospheres on both sides. This was maintained for approximately 45 seconds. The balloon was then deflated and retrieved proximally with the wire being maintained distally. A control arteriogram performed through the Phenom catheter demonstrated significantly improved caliber and flow through the both sides of severe stenosis. There was now improved flow in the superior cerebellar arteries with no significant change in the proximal P1 segments. Given the appreciable significant waist at the sites of the 2 angioplasties, a Gateway 2 mm x 15 mm angioplasty microcatheter again which had been prepped as mentioned above was advanced over an 014 inch micro guidewire with a moderate J configuration to the distal end of the Phenom 44 catheter which was now positioned in the mid left vertebrobasilar junction. The 2 angioplastied segments were then crossed without any difficulty into the distal basilar artery. Control inflation was then performed using micro inflation syringe device via micro tubing  with the balloon being inflated to 5 atmospheres just at 2 mm. Both sides of the balloon was then obtained for approximately 45 seconds. Following deflation and removal of the balloon, control arteriogram was performed significantly improved caliber and flow through the angioplastied segment. There was now patency of approximately 55% to 60% on both the AP and lateral projections at both sites. Follow-up arteriogram performed at 15 and 30 minutes post angioplasty at both sites continued to demonstrate excellent flow through both the sites without change in the caliber without any rebound phenomenon evident. The angioplasty Gateway balloon catheter was then gently retrieved and removed with the wire. A control arteriogram performed through the Benchmark guide catheter in the left vertebral artery continued to demonstrate the patency of the extracranial and intracranial left vertebral artery including the previously noted branches. The angioplastied segments at the mid basilar artery, and the distal left vertebrobasilar to be maintained. The Benchmark guide catheter was retrieved and removed. An 8 French Angio-Seal closure device was then deployed for hemostasis at the right groin puncture site. Distal pulses continued to be Dopplerable in both feet unchanged from prior to procedure. A flat panel CT of the brain demonstrated no evidence of intracranial hemorrhage, or hydrocephalus. Patient's general anesthesia was then reversed, and patient extubated. Upon recovery, the patient responded to name turning, and also frequent movement of his right arm and leg freely. Pupils were 2-3 mm regular round and unchanged. Over the next few minutes, the patient's sodium continued to  improve where the patient was more responsive and appropriate. Continued to have difficulty with dysarthria. Now significantly more movement was noted of the left arm and leg. Patient was then transferred to the PACU and then subsequently the neuro  ICU. The next morning, the patient had improved significantly where he was more promptly and readily responsive. His speech was returning to where he was able to make phrases appropriately. His left facial droop appeared less prominent. No evidence of nystagmus diplopia was evident on examination. The patient was able to hold his left upper extremity against gravity to a count of 10. He was able to bend his left leg at the knee and able to keep his left leg off the bed. The right groin appeared soft without any tenderness, or of palpable hematoma. 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 PLAN: Patient to be seen in the clinic 1 month post discharge with MRI and MRA of the brain. Electronically Signed   By: Julieanne Cotton M.D.   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 Previous history of bilateral occipital infarcts. EXAM: PTA INTRACRANIAL COMPARISON:  Recent CT angiogram of the head and neck, and MRI of the brain. MEDICATIONS: Heparin 4,000 units IV. Ancef 2 g IV antibiotic was administered within 1 hour of the procedure. ANESTHESIA/SEDATION: General anesthesia. CONTRAST:  Omnipaque 300 approximately 80 cc. FLUOROSCOPY TIME:  FLUOROSCOPY TIME Fluoroscopy Time: 29 minutes 24 seconds (2813 mGy). COMPLICATIONS: None immediate. TECHNIQUE: Informed written consent was obtained from the patient after a thorough discussion of the procedural risks, benefits and alternatives. All questions were addressed. Maximal Sterile Barrier Technique was utilized including caps, mask, sterile gowns, sterile gloves, sterile drape, hand hygiene and  skin antiseptic. A timeout was performed prior to the initiation of the procedure. The right groin was prepped and draped in the usual sterile fashion. Thereafter using modified Seldinger technique, transfemoral access into the right common femoral artery was obtained without difficulty. Over a 0.035 inch guidewire, an 8 French 15 cm Pinnacle sheath was inserted. Through this, and also over 0.035 inch guidewire, a 5 [place] JB 1 catheter was advanced to the aortic arch region and selectively positioned in the distal left subclavian artery. FINDINGS: The left vertebral artery origin demonstrates approximately 30% stenosis at its origin. More distally, the vessel is seen to opacify to the cranial skull base. The left vertebrobasilar junction proximal just distal to the left posterior-inferior cerebellar artery demonstrates minimal stenosis. Severe focal stenosis again demonstrated of the distal left vertebrobasilar junction at its confluence with the proximal basilar artery. Distal to this the proximal basilar artery appears patent to the level of the anterior-inferior cerebellar arteries. Distal to this there is a severe pre occlusive stenosis of the mid basilar artery. Distal to this the basilar artery caliber improves to near normal. Distal focal narrowing is seen of the distal basilar artery at the level of the superior cerebellar arteries. There is incomplete irregular opacification of the proximal P1 segments secondary to severe intracranial arteriosclerosis. Stenosis is noted focally at the origin of the left superior cerebellar artery, and the left anterior-inferior cerebellar artery. Partial retrograde opacification of the right vertebrobasilar junction is noted to the level  of the right posterior-inferior cerebellar artery. High-grade stenosis is again demonstrated of the distal right vertebrobasilar junction proximal to the basilar artery. ENDOVASCULAR REVASCULARIZATION OF THE MID BASILAR ARTERY STENOSIS AND  OF THE LEFT VERTEBROBASILAR JUNCTION AT THE LEVEL OF THE PROXIMAL BASILAR ARTERY WITH BALLOON ANGIOPLASTY. The JB 1 diagnostic catheter in the distal left subclavian artery was then exchanged over a 0.035 inch 300 cm Rosen exchange guidewire for a 95 cm 071 Benchmark guide catheter with a 125 cm Bernstein catheter. The guidewire was removed. Good aspiration was obtained at the hub of the [place] support catheter. An 035 inch Roadrunner guidewire was then advanced through the support catheter and advanced distally into the vertebral artery followed by the support catheter followed by the Benchmark guide catheter. The guidewire, and the support catheter were removed. Good aspiration was obtained from the hub of the Benchmark guide catheter. A control arteriogram performed through this demonstrated no evidence of spasms, dissections or of intraluminal filling defects. Again demonstrated was the high-grade stenosis of the basilar artery in the left vertebrobasilar junction. Over an 014 inch soft tip micro guidewire, an 044 Phenom 120 cm guide catheter was then advanced distal to the Benchmark guide catheter and positioned in the V4 segment of the left vertebral artery. The guidewire was removed. Gentle control arteriogram performed through the Phenom catheter demonstrated no changes in the intracranial circulation. At this time, a 1.5 mm x 15 mm Gateway angioplasty balloon catheter which had been prepped with 75% contrast and 25% heparinized saline infusion was then advanced over an 014 inch Aristotle soft tip micro guidewire to the distal end of the Phenom catheter. The guidewire was then gently manipulated without any difficulty through the left vertebrobasilar stenosis and the tight mid basilar artery stenosis of the distal basilar artery. Balloon was then advanced to the position initially across the mid basilar artery stenosis and then the proximal left vertebrobasilar artery junction stenosis. At each site,  balloon was inflated using micro inflation syringe device via micro tubing in a progressively slow fashion to approximately 5 atmospheres on both sides. This was maintained for approximately 45 seconds. The balloon was then deflated and retrieved proximally with the wire being maintained distally. A control arteriogram performed through the Phenom catheter demonstrated significantly improved caliber and flow through the both sides of severe stenosis. There was now improved flow in the superior cerebellar arteries with no significant change in the proximal P1 segments. Given the appreciable significant waist at the sites of the 2 angioplasties, a Gateway 2 mm x 15 mm angioplasty microcatheter again which had been prepped as mentioned above was advanced over an 014 inch micro guidewire with a moderate J configuration to the distal end of the Phenom 44 catheter which was now positioned in the mid left vertebrobasilar junction. The 2 angioplastied segments were then crossed without any difficulty into the distal basilar artery. Control inflation was then performed using micro inflation syringe device via micro tubing with the balloon being inflated to 5 atmospheres just at 2 mm. Both sides of the balloon was then obtained for approximately 45 seconds. Following deflation and removal of the balloon, control arteriogram was performed significantly improved caliber and flow through the angioplastied segment. There was now patency of approximately 55% to 60% on both the AP and lateral projections at both sites. Follow-up arteriogram performed at 15 and 30 minutes post angioplasty at both sites continued to demonstrate excellent flow through both the sites without change in the caliber without any  rebound phenomenon evident. The angioplasty Gateway balloon catheter was then gently retrieved and removed with the wire. A control arteriogram performed through the Benchmark guide catheter in the left vertebral artery continued  to demonstrate the patency of the extracranial and intracranial left vertebral artery including the previously noted branches. The angioplastied segments at the mid basilar artery, and the distal left vertebrobasilar to be maintained. The Benchmark guide catheter was retrieved and removed. An 8 French Angio-Seal closure device was then deployed for hemostasis at the right groin puncture site. Distal pulses continued to be Dopplerable in both feet unchanged from prior to procedure. A flat panel CT of the brain demonstrated no evidence of intracranial hemorrhage, or hydrocephalus. Patient's general anesthesia was then reversed, and patient extubated. Upon recovery, the patient responded to name turning, and also frequent movement of his right arm and leg freely. Pupils were 2-3 mm regular round and unchanged. Over the next few minutes, the patient's sodium continued to improve where the patient was more responsive and appropriate. Continued to have difficulty with dysarthria. Now significantly more movement was noted of the left arm and leg. Patient was then transferred to the PACU and then subsequently the neuro ICU. The next morning, the patient had improved significantly where he was more promptly and readily responsive. His speech was returning to where he was able to make phrases appropriately. His left facial droop appeared less prominent. No evidence of nystagmus diplopia was evident on examination. The patient was able to hold his left upper extremity against gravity to a count of 10. He was able to bend his left leg at the knee and able to keep his left leg off the bed. The right groin appeared soft without any tenderness, or of palpable hematoma. 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 PLAN: Patient to be seen in  the clinic 1 month post discharge with MRI and MRA of the brain. Electronically Signed   By: Julieanne CottonSanjeev  Deveshwar M.D.   On: 07/22/2022 11:36   MR ANGIO HEAD WO CONTRAST  Result Date: 07/19/2022 CLINICAL DATA:  Stroke follow-up EXAM: MRI HEAD WITHOUT CONTRAST MRA HEAD WITHOUT CONTRAST TECHNIQUE: Multiplanar, multi-echo pulse sequences of the brain and surrounding structures were acquired without intravenous contrast. Angiographic images of the Circle of Willis were acquired using MRA technique without intravenous contrast. 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 These are all small. 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 There is multifocal hyperintense T2-weighted signal within the white matter. Generalized volume loss. 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 The midline structures are normal. Vascular: Major flow voids are preserved. Skull and upper cervical spine: Normal calvarium and skull base. Visualized upper cervical spine and soft tissues are normal. Sinuses/Orbits:No paranasal sinus fluid levels or advanced mucosal thickening. No mastoid or middle ear effusion. Normal orbits. MRA HEAD FINDINGS POSTERIOR CIRCULATION: --Vertebral arteries: Normal --Inferior cerebellar arteries: Normal. --Basilar artery: Normal. --Superior cerebellar arteries: Normal. --Posterior cerebral arteries: Unchanged bilateral P1 occlusion. ANTERIOR CIRCULATION: --Intracranial internal carotid arteries: Normal. --Anterior cerebral arteries (ACA): Normal. Hypoplastic left A1 segment, normal variant --Middle cerebral arteries (MCA): Mild atherosclerotic irregularity of both M1 segments. ANATOMIC VARIANTS: None IMPRESSION: 1. Multiple new, small acute infarcts within  both cerebellar hemispheres, right greater than left. Unchanged  appearance of right paramedian pontine infarct. 2. Unchanged bilateral P1 occlusions. No new intracranial occlusion. Electronically Signed   By: Deatra Robinson M.D.   On: 07/19/2022 01:43   MR BRAIN WO CONTRAST  Result Date: 07/19/2022 CLINICAL DATA:  Stroke follow-up EXAM: MRI HEAD WITHOUT CONTRAST MRA HEAD WITHOUT CONTRAST TECHNIQUE: Multiplanar, multi-echo pulse sequences of the brain and surrounding structures were acquired without intravenous contrast. Angiographic images of the Circle of Willis were acquired using MRA technique without intravenous contrast. 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 These are all small. 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 There is multifocal hyperintense T2-weighted signal within the white matter. Generalized volume loss. 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 The midline structures are normal. Vascular: Major flow voids are preserved. Skull and upper cervical spine: Normal calvarium and skull base. Visualized upper cervical spine and soft tissues are normal. Sinuses/Orbits:No paranasal sinus fluid levels or advanced mucosal thickening. No mastoid or middle ear effusion. Normal orbits. MRA HEAD FINDINGS POSTERIOR CIRCULATION: --Vertebral arteries: Normal --Inferior cerebellar arteries: Normal. --Basilar artery: Normal. --Superior cerebellar arteries: Normal. --Posterior cerebral arteries: Unchanged bilateral P1 occlusion. ANTERIOR CIRCULATION: --Intracranial internal carotid arteries: Normal. --Anterior cerebral arteries (ACA): Normal. Hypoplastic left A1 segment, normal variant --Middle cerebral arteries (MCA): Mild atherosclerotic irregularity of both M1 segments. ANATOMIC VARIANTS: None IMPRESSION: 1. 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 2. Unchanged bilateral P1 occlusions. No new intracranial occlusion. Electronically Signed   By: Deatra Robinson M.D.   On: 07/19/2022 01:43   IR ANGIO INTRA EXTRACRAN SEL COM CAROTID INNOMINATE BILAT MOD SED  Result Date: 07/17/2022 CLINICAL DATA:  Severe vertebrobasilar ischemic symptoms of dizziness, gait imbalance, and lethargy. 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 EXAM: BILATERAL COMMON CAROTID AND INNOMINATE ANGIOGRAPHY COMPARISON:  CT angiogram of the head and neck July 15, 2022. MEDICATIONS: Heparin 1000 units IV. No antibiotic was administered within 1 hour of the procedure. ANESTHESIA/SEDATION: Versed 1 mg IV; Fentanyl 25 mcg IV Moderate Sedation Time:  27 minutes The patient was continuously monitored during the procedure by the interventional radiology nurse under my direct supervision. CONTRAST:  Omnipaque 300 approximately 65 mL. FLUOROSCOPY TIME:  Fluoroscopy Time: 8 minutes 0 seconds (1080 mGy). COMPLICATIONS: None immediate. TECHNIQUE: Informed written consent was obtained from the patient after a thorough discussion of the procedural risks, benefits and alternatives. All questions were addressed. Maximal Sterile Barrier Technique was utilized including caps, mask, sterile gowns, sterile gloves, sterile drape, hand hygiene and skin antiseptic. A timeout was performed prior to the initiation of the procedure. The right groin was prepped and draped in the usual sterile fashion. Thereafter using modified Seldinger technique, transfemoral access into the right common femoral artery was obtained without difficulty. Over an 0.035 inch guidewire, a 5 French Pinnacle sheath was inserted. Through this, and also over an 0.035 inch guidewire, a 5 [place] JB 1 catheter was advanced to the aortic arch region and selectively positioned in the right common  carotid artery, the right vertebral artery, the left common carotid artery and the left vertebral artery. FINDINGS: The innominate arteriogram demonstrates the right subclavian artery proximally in the right common carotid artery to  be patent proximally. The non dominant right vertebral artery has a 30% stenosis at its origin. More distally, the vessel is seen to ascend to the cranial skull base. Patency is seen of the right vertebrobasilar junction with a mild-to-moderate tapered stenosis of the distal right vertebrobasilar junction. Severe decrease in caliber is seen of the proximal basilar artery, and in the mid basilar artery. Contrast is noted in the distal basilar artery, the superior cerebellar artery and transiently the anterior cerebral arteries proximally. The right common carotid arteriogram demonstrates the right external carotid artery and its major branches to be widely patent. The right internal carotid artery stenosis proximally with a small ulcerated plaque along the medial wall of the bulb. More distally, the right internal carotid artery opacifies normally to the cranial skull base. The petrous and the cavernous segments are widely patent. There is a 50-60% stenosis of the right internal carotid artery supraclinoid segment. The right middle cerebral artery demonstrates a 50-60% stenosis in the mid M1 segment with flow noted into the distal distribution. The right anterior cerebral artery opacifies into the capillary and venous phases with prompt cross-filling via the anterior communicating artery of the left anterior cerebral artery A2 segment and distally. The left common carotid arteriogram demonstrates the left external carotid artery and its major branches to be widely patent. The left internal carotid artery at the bulb has a smooth shallow plaque along the posterior wall of the bulb with no significant stenosis by the NASCET criteria. No acute ulcerations are identifiable. More distally, the  left internal carotid artery opacifies to the cranial skull base. The petrous and the proximal cavernous segments are widely patent. There is a mild stenosis of the distal cavernous segment with the supraclinoid segment demonstrating near normal patency. The left middle cerebral artery has a mild stenosis in its proximal M1 segment. More distally, the trifurcation branches opacify normally. The left anterior cerebral artery proximal A1 segment demonstrates moderate to severe stenosis with flow noted more distally into the A2 segment. The dominant left vertebral artery has approximately 50% stenosis at its origin. More distally, the vessel is seen to opacify to the cranial skull base. Wide patency is seen of the left vertebrobasilar junction at the level of the left posterior-inferior cerebellar artery. Severe stenosis is seen of the distal left vertebrobasilar junction extending into the proximal basilar artery. Patency is seen of the superior cerebellar arteries and the anterior-inferior cerebellar arteries. Retrograde opacification is seen of the right vertebrobasilar junction with focal significant stenosis just proximal to the basilar artery. 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 Approximately 50% stenosis of the dominant left vertebral artery at its origin. Approximately 30% stenosis of the non dominant right vertebral artery at its origin, with a moderate to severe stenosis of the distal right vertebrobasilar junction. Approximately 50%-60% stenosis of the right internal carotid artery supraclinoid segment. Approximately 50-60% stenosis of the right middle cerebral artery mid M1 segment. PLAN: Findings discussed with the patient and the spouse regarding management of the symptomatic high-grade stenosis of the mid basilar artery, and of the proximal basilar artery extending into the distal  left vertebrobasilar junction. Electronically Signed   By: Julieanne Cotton M.D.   On: 07/17/2022 08:05   IR ANGIO VERTEBRAL SEL VERTEBRAL UNI L MOD SED  Result Date: 07/17/2022 CLINICAL DATA:  Severe vertebrobasilar ischemic symptoms of dizziness, gait imbalance, and lethargy. Severely stenotic  mid basilar and proximal basilar artery on CT angiogram of the head and neck. EXAM: BILATERAL COMMON CAROTID AND INNOMINATE ANGIOGRAPHY COMPARISON:  CT angiogram of the head and neck July 15, 2022. MEDICATIONS: Heparin 1000 units IV. No antibiotic was administered within 1 hour of the procedure. ANESTHESIA/SEDATION: Versed 1 mg IV; Fentanyl 25 mcg IV Moderate Sedation Time:  27 minutes The patient was continuously monitored during the procedure by the interventional radiology nurse under my direct supervision. CONTRAST:  Omnipaque 300 approximately 65 mL. FLUOROSCOPY TIME:  Fluoroscopy Time: 8 minutes 0 seconds (1080 mGy). COMPLICATIONS: None immediate. TECHNIQUE: Informed written consent was obtained from the patient after a thorough discussion of the procedural risks, benefits and alternatives. All questions were addressed. Maximal Sterile Barrier Technique was utilized including caps, mask, sterile gowns, sterile gloves, sterile drape, hand hygiene and skin antiseptic. A timeout was performed prior to the initiation of the procedure. The right groin was prepped and draped in the usual sterile fashion. Thereafter using modified Seldinger technique, transfemoral access into the right common femoral artery was obtained without difficulty. Over an 0.035 inch guidewire, a 5 French Pinnacle sheath was inserted. Through this, and also over an 0.035 inch guidewire, a 5 [place] JB 1 catheter was advanced to the aortic arch region and selectively positioned in the right common carotid artery, the right vertebral artery, the left common carotid artery and the left vertebral artery. FINDINGS: The innominate arteriogram  demonstrates the right subclavian artery proximally in the right common carotid artery to be patent proximally. The non dominant right vertebral artery has a 30% stenosis at its origin. More distally, the vessel is seen to ascend to the cranial skull base. Patency is seen of the right vertebrobasilar junction with a mild-to-moderate tapered stenosis of the distal right vertebrobasilar junction. Severe decrease in caliber is seen of the proximal basilar artery, and in the mid basilar artery. Contrast is noted in the distal basilar artery, the superior cerebellar artery and transiently the anterior cerebral arteries proximally. The right common carotid arteriogram demonstrates the right external carotid artery and its major branches to be widely patent. The right internal carotid artery stenosis proximally with a small ulcerated plaque along the medial wall of the bulb. More distally, the right internal carotid artery opacifies normally to the cranial skull base. The petrous and the cavernous segments are widely patent. There is a 50-60% stenosis of the right internal carotid artery supraclinoid segment. The right middle cerebral artery demonstrates a 50-60% stenosis in the mid M1 segment with flow noted into the distal distribution. The right anterior cerebral artery opacifies into the capillary and venous phases with prompt cross-filling via the anterior communicating artery of the left anterior cerebral artery A2 segment and distally. The left common carotid arteriogram demonstrates the left external carotid artery and its major branches to be widely patent. The left internal carotid artery at the bulb has a smooth shallow plaque along the posterior wall of the bulb with no significant stenosis by the NASCET criteria. No acute ulcerations are identifiable. More distally, the left internal carotid artery opacifies to the cranial skull base. The petrous and the proximal cavernous segments are widely patent. There is  a mild stenosis of the distal cavernous segment with the supraclinoid segment demonstrating near normal patency. The left middle cerebral artery has a mild stenosis in its proximal M1 segment. More distally, the trifurcation branches opacify normally. The left anterior cerebral artery proximal A1 segment demonstrates moderate to severe stenosis with flow noted  more distally into the A2 segment. The dominant left vertebral artery has approximately 50% stenosis at its origin. More distally, the vessel is seen to opacify to the cranial skull base. Wide patency is seen of the left vertebrobasilar junction at the level of the left posterior-inferior cerebellar artery. Severe stenosis is seen of the distal left vertebrobasilar junction extending into the proximal basilar artery. Patency is seen of the superior cerebellar arteries and the anterior-inferior cerebellar arteries. Retrograde opacification is seen of the right vertebrobasilar junction with focal significant stenosis just proximal to the basilar artery. 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 Approximately 50% stenosis of the dominant left vertebral artery at its origin. Approximately 30% stenosis of the non dominant right vertebral artery at its origin, with a moderate to severe stenosis of the distal right vertebrobasilar junction. Approximately 50%-60% stenosis of the right internal carotid artery supraclinoid segment. Approximately 50-60% stenosis of the right middle cerebral artery mid M1 segment. 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 Electronically Signed   By: Julieanne Cotton M.D.   On: 07/17/2022 08:05   IR ANGIO VERTEBRAL SEL SUBCLAVIAN  INNOMINATE UNI R MOD SED  Result Date: 07/17/2022 CLINICAL DATA:  Severe vertebrobasilar ischemic symptoms of dizziness, gait imbalance, and lethargy. 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 EXAM: BILATERAL COMMON CAROTID AND INNOMINATE ANGIOGRAPHY COMPARISON:  CT angiogram of the head and neck July 15, 2022. MEDICATIONS: Heparin 1000 units IV. No antibiotic was administered within 1 hour of the procedure. ANESTHESIA/SEDATION: Versed 1 mg IV; Fentanyl 25 mcg IV Moderate Sedation Time:  27 minutes The patient was continuously monitored during the procedure by the interventional radiology nurse under my direct supervision. CONTRAST:  Omnipaque 300 approximately 65 mL. FLUOROSCOPY TIME:  Fluoroscopy Time: 8 minutes 0 seconds (1080 mGy). COMPLICATIONS: None immediate. TECHNIQUE: Informed written consent was obtained from the patient after a thorough discussion of the procedural risks, benefits and alternatives. All questions were addressed. Maximal Sterile Barrier Technique was utilized including caps, mask, sterile gowns, sterile gloves, sterile drape, hand hygiene and skin antiseptic. A timeout was performed prior to the initiation of the procedure. The right groin was prepped and draped in the usual sterile fashion. Thereafter using modified Seldinger technique, transfemoral access into the right common femoral artery was obtained without difficulty. Over an 0.035 inch guidewire, a 5 French Pinnacle sheath was inserted. Through this, and also over an 0.035 inch guidewire, a 5 [place] JB 1 catheter was advanced to the aortic arch region and selectively positioned in the right common carotid artery, the right vertebral artery, the left common carotid artery and the left vertebral artery. FINDINGS: The innominate arteriogram demonstrates the right subclavian artery proximally in the right common carotid artery to be patent proximally. The non dominant right  vertebral artery has a 30% stenosis at its origin. More distally, the vessel is seen to ascend to the cranial skull base. Patency is seen of the right vertebrobasilar junction with a mild-to-moderate tapered stenosis of the distal right vertebrobasilar junction. Severe decrease in caliber is seen of the proximal basilar artery, and in the mid basilar artery. Contrast is noted in the distal basilar artery, the superior cerebellar artery and transiently  the anterior cerebral arteries proximally. The right common carotid arteriogram demonstrates the right external carotid artery and its major branches to be widely patent. The right internal carotid artery stenosis proximally with a small ulcerated plaque along the medial wall of the bulb. More distally, the right internal carotid artery opacifies normally to the cranial skull base. The petrous and the cavernous segments are widely patent. There is a 50-60% stenosis of the right internal carotid artery supraclinoid segment. The right middle cerebral artery demonstrates a 50-60% stenosis in the mid M1 segment with flow noted into the distal distribution. The right anterior cerebral artery opacifies into the capillary and venous phases with prompt cross-filling via the anterior communicating artery of the left anterior cerebral artery A2 segment and distally. The left common carotid arteriogram demonstrates the left external carotid artery and its major branches to be widely patent. The left internal carotid artery at the bulb has a smooth shallow plaque along the posterior wall of the bulb with no significant stenosis by the NASCET criteria. No acute ulcerations are identifiable. More distally, the left internal carotid artery opacifies to the cranial skull base. The petrous and the proximal cavernous segments are widely patent. There is a mild stenosis of the distal cavernous segment with the supraclinoid segment demonstrating near normal patency. The left middle  cerebral artery has a mild stenosis in its proximal M1 segment. More distally, the trifurcation branches opacify normally. The left anterior cerebral artery proximal A1 segment demonstrates moderate to severe stenosis with flow noted more distally into the A2 segment. The dominant left vertebral artery has approximately 50% stenosis at its origin. More distally, the vessel is seen to opacify to the cranial skull base. Wide patency is seen of the left vertebrobasilar junction at the level of the left posterior-inferior cerebellar artery. Severe stenosis is seen of the distal left vertebrobasilar junction extending into the proximal basilar artery. Patency is seen of the superior cerebellar arteries and the anterior-inferior cerebellar arteries. Retrograde opacification is seen of the right vertebrobasilar junction with focal significant stenosis just proximal to the basilar artery. 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 Approximately 50% stenosis of the dominant left vertebral artery at its origin. Approximately 30% stenosis of the non dominant right vertebral artery at its origin, with a moderate to severe stenosis of the distal right vertebrobasilar junction. Approximately 50%-60% stenosis of the right internal carotid artery supraclinoid segment. Approximately 50-60% stenosis of the right middle cerebral artery mid M1 segment. 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 Electronically Signed   By: Julieanne Cotton M.D.   On: 07/17/2022 08:05   MR BRAIN WO CONTRAST  Result Date: 07/16/2022 CLINICAL DATA:  Stroke follow-up EXAM: MRI HEAD WITHOUT CONTRAST TECHNIQUE: Multiplanar, multiecho pulse  sequences of the brain and surrounding structures were obtained without intravenous contrast. Four sequences were performed COMPARISON:  Head CT 07/15/2022 FINDINGS: There is a small acute infarct of the right paramedian ventral pons. No other area of acute ischemia. There are old bilateral occipital infarcts and an old left lentiform nucleus infarct. There is multifocal periventricular white matter hyperintensity, most often a result of chronic microvascular ischemia. No acute hemorrhage. IMPRESSION: Small acute infarct of the right paramedian ventral pons. Electronically Signed  By: Deatra Robinson M.D.   On: 07/16/2022 22:06   ECHOCARDIOGRAM COMPLETE  Result Date: 07/16/2022    ECHOCARDIOGRAM REPORT   Patient Name:   SQUARE JOWETT Date of Exam: 07/16/2022 Medical Rec #:  [id number]        Height:       69.0 in Accession #:    [id number]       Weight:       179.2 lb Date of Birth:  1951-01-10         BSA:          1.972 m Patient Age:    71 years         BP:           151/71 mmHg Patient Gender: M                HR:           50 bpm. Exam Location:  Inpatient Procedure: 2D Echo, Cardiac Doppler and Color Doppler Indications:    TIA G45.9  History:        Patient has no prior history of Echocardiogram examinations.                 Stroke; Risk Factors:Hypertension, Dyslipidemia and Diabetes.  Sonographer:    Eulah Pont RDCS Referring Phys: [id number] SARA-MAIZ A THOMAS IMPRESSIONS  1. Left ventricular ejection fraction, by estimation, is 60 to 65%. The left ventricle has normal function. The left ventricle has no regional wall motion abnormalities. Left ventricular diastolic parameters are indeterminate.  2. Right ventricular systolic function is normal. The right ventricular size is normal. Tricuspid regurgitation signal is inadequate for assessing PA pressure.  3. Left atrial size was mildly dilated.  4. The mitral valve is normal in structure. Trivial mitral valve regurgitation. No evidence of mitral  stenosis.  5. The aortic valve is tricuspid. There is mild thickening of the aortic valve. Aortic valve regurgitation is not visualized. Aortic valve sclerosis is present, with no evidence of aortic valve stenosis.  6. The inferior vena cava is normal in size with greater than 50% respiratory variability, suggesting right atrial pressure of 3 mmHg. FINDINGS  Left Ventricle: Left ventricular ejection fraction, by estimation, is 60 to 65%. The left ventricle has normal function. The left ventricle has no regional wall motion abnormalities. The left ventricular internal cavity size was normal in size. There is  no left ventricular hypertrophy. Left ventricular diastolic parameters are indeterminate. Right Ventricle: The right ventricular size is normal. No increase in right ventricular wall thickness. Right ventricular systolic function is normal. Tricuspid regurgitation signal is inadequate for assessing PA pressure. Left Atrium: Left atrial size was mildly dilated. Right Atrium: Right atrial size was normal in size. Pericardium: There is no evidence of pericardial effusion. Mitral Valve: The mitral valve is normal in structure. Mild mitral annular calcification. Trivial mitral valve regurgitation. No evidence of mitral valve stenosis. Tricuspid Valve: The tricuspid valve is normal in structure. Tricuspid valve regurgitation is not demonstrated. Aortic Valve: The aortic valve is tricuspid. There is mild thickening of the aortic valve. Aortic valve regurgitation is not visualized. Aortic valve sclerosis is present, with no evidence of aortic valve stenosis. Pulmonic Valve: The pulmonic valve was grossly normal. Pulmonic valve regurgitation is not visualized. Aorta: The aortic root and ascending aorta are structurally normal, with no evidence of dilitation. Venous: The inferior vena cava is normal in size with greater than 50% respiratory variability, suggesting right atrial pressure of  3 mmHg. IAS/Shunts: No atrial  level shunt detected by color flow Doppler.  LEFT VENTRICLE PLAX 2D LVIDd:         4.50 cm     Diastology LVIDs:         2.60 cm     LV e' medial:    5.76 cm/s LV PW:         0.90 cm     LV E/e' medial:  14.7 LV IVS:        0.90 cm     LV e' lateral:   8.43 cm/s LVOT diam:     2.00 cm     LV E/e' lateral: 10.1 LV SV:         69 LV SV Index:   35 LVOT Area:     3.14 cm  LV Volumes (MOD) LV vol d, MOD A2C: 76.2 ml LV vol d, MOD A4C: 93.5 ml LV vol s, MOD A2C: 28.6 ml LV vol s, MOD A4C: 36.4 ml LV SV MOD A2C:     47.6 ml LV SV MOD A4C:     93.5 ml LV SV MOD BP:      52.2 ml RIGHT VENTRICLE RV S prime:     11.60 cm/s TAPSE (M-mode): 1.9 cm LEFT ATRIUM             Index        RIGHT ATRIUM           Index LA diam:        3.90 cm 1.98 cm/m   RA Area:     12.70 cm LA Vol (A2C):   26.7 ml 13.54 ml/m  RA Volume:   24.60 ml  12.47 ml/m LA Vol (A4C):   29.2 ml 14.81 ml/m LA Biplane Vol: 27.9 ml 14.15 ml/m  AORTIC VALVE LVOT Vmax:   88.80 cm/s LVOT Vmean:  55.300 cm/s LVOT VTI:    0.220 m  AORTA Ao Root diam: 2.90 cm Ao Asc diam:  3.30 cm MITRAL VALVE MV Area (PHT): 2.87 cm    SHUNTS MV Decel Time: 264 msec    Systemic VTI:  0.22 m MV E velocity: 84.90 cm/s  Systemic Diam: 2.00 cm MV A velocity: 82.30 cm/s MV E/A ratio:  1.03 Mihai Croitoru MD Electronically signed by Thurmon Fair MD Signature Date/Time: 07/16/2022/12:32:57 PM    Final    MR BRAIN WO CONTRAST  Result Date: 07/15/2022 CLINICAL DATA:  Neuro deficit, acute, stroke suspected. Gait disturbance. Headache. Dementia. EXAM: MRI HEAD WITHOUT CONTRAST TECHNIQUE: Multiplanar, multiecho pulse sequences of the brain and surrounding structures were obtained without intravenous contrast. COMPARISON:  CT studies earlier same day FINDINGS: Brain: Diffusion imaging does not show any acute or subacute infarction. No focal abnormality affects the brainstem. There are numerous old small vessel cerebellar infarctions. Cerebral hemispheres show old infarctions in the  PCA territories more extensive on the left than the right with encephalomalacia and gliosis. There is old infarction in the left basal ganglia and external capsule region. Mild chronic small-vessel ischemic changes seen elsewhere within the hemispheric white matter. Hemosiderin deposition is present in association with the old left basal ganglia/external capsule infarction. No mass lesion, recent hemorrhage, hydrocephalus or extra-axial collection. Vascular: Major vessels at the base of the brain show flow. Skull and upper cervical spine: Negative Sinuses/Orbits: Small amount of mucoid fluid in the left division of the sphenoid sinus. Mild mucosal thickening elsewhere consistent with seasonal rhinitis. Other: None IMPRESSION: 1. No acute or  reversible finding. 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 Mild chronic small-vessel ischemic changes elsewhere affecting the brain as outlined above. 2. Mild mucosal thickening and mucoid fluid in the left division of the sphenoid sinus consistent with seasonal rhinitis. Electronically Signed   By: Paulina Fusi M.D.   On: 07/15/2022 12:59   CT ANGIO HEAD NECK W WO CM W PERF (CODE STROKE)  Result Date: 07/15/2022 CLINICAL DATA:  Neuro deficit, acute, stroke suspected. Ataxia, slurred speech, left-sided weakness, and facial droop. EXAM: CT ANGIOGRAPHY HEAD AND NECK CT PERFUSION BRAIN TECHNIQUE: Multidetector CT imaging of the head and neck was performed using the standard protocol during bolus administration of intravenous contrast. Multiplanar CT image reconstructions and MIPs were obtained to evaluate the vascular anatomy. Carotid stenosis measurements (when applicable) are obtained utilizing NASCET criteria, using the distal internal carotid diameter as the denominator. Multiphase CT imaging of the brain was performed following IV bolus contrast injection. Subsequent parametric perfusion  maps were calculated using RAPID software. RADIATION DOSE REDUCTION: This exam was performed according to the departmental dose-optimization program which includes automated exposure control, adjustment of the mA and/or kV according to patient size and/or use of iterative reconstruction technique. CONTRAST:  OMNIPAQUE IOHEXOL 350 MG/ML SOLN COMPARISON:  None Available. FINDINGS: CTA NECK FINDINGS Aortic arch: Normal variant aortic arch branching pattern with common origin of the brachiocephalic and left common carotid arteries. Mild atherosclerosis without a significant stenosis of the arch vessel origins. Right carotid system: Patent with a small amount of calcified and soft plaque at the carotid bifurcation. No evidence of a significant stenosis or dissection. Left carotid system: Patent with a small amount of calcified and soft plaque in the distal common carotid artery. No evidence of a significant stenosis or dissection. Vertebral arteries: Patent with the left being dominant. Plaque at the vertebral origins results in mild stenosis on the right and moderate stenosis on the left. Mild multifocal irregular narrowing of the right V2 segment. Skeleton: Mild cervical spondylosis. Other neck: No evidence of cervical lymphadenopathy or mass. Upper chest: Clear lung apices. Review of the MIP images confirms the above findings CTA HEAD FINDINGS Anterior circulation: The internal carotid arteries are patent from skull base to carotid termini with moderate to severe paraclinoid stenoses bilaterally. ACAs and MCAs are patent with moderate branch vessel atherosclerotic irregularity as well as severe irregular narrowing of the left A1 segment. No aneurysm is identified. Posterior circulation: The intracranial vertebral arteries are patent to the basilar with severe stenoses bilaterally. The basilar artery is patent with a severe stenosis of its proximal to midportion (immediately distal to the right AICA origin) and a  moderate stenosis distally. Both PCAs are occluded at the P1 levels. There may be a small right posterior communicating artery, and there is some reconstitution of distal PCA branches primarily on the right. No aneurysm is identified. Venous sinuses: Patent. Anatomic variants: None. Review of the MIP images confirms the above findings CT Brain Perfusion Findings: ASPECTS: 10 CBF (<30%) Volume: 0 mL Perfusion (Tmax>6.0s) volume: 24 mL, located in both occipital lobes (PCA territories) Mismatch Volume: 24 mL Infarction Location: No acute core infarct identified by CTP. The study was reviewed by telephone with Dr. Wilford Corner on 07/15/2022 at 11:10 a.m. IMPRESSION: 1. Bilateral proximal PCA occlusion. 2. 24 mL of reported penumbra in the occipital lobes which largely corresponds to chronic infarcts. 3. Advanced intracranial atherosclerosis including severe bilateral V4, severe basilar artery, and  moderate to severe bilateral ICA stenoses. 4. Mild cervical carotid artery atherosclerosis without significant stenosis. 5. Moderate left and mild right vertebral artery origin stenoses. 6.  Aortic Atherosclerosis (ICD10-I70.0). Electronically Signed   By: Sebastian Ache M.D.   On: 07/15/2022 11:48   CT HEAD CODE STROKE WO CONTRAST  Result Date: 07/15/2022 CLINICAL DATA:  Code stroke. Neuro deficit, acute, stroke suspected. Ataxia, slurred speech, left-sided weakness, and facial droop. EXAM: CT HEAD WITHOUT CONTRAST TECHNIQUE: Contiguous axial images were obtained from the base of the skull through the vertex without intravenous contrast. RADIATION DOSE REDUCTION: This exam was performed according to the departmental dose-optimization program which includes automated exposure control, adjustment of the mA and/or kV according to patient size and/or use of iterative reconstruction technique. COMPARISON:  Head CT 01/21/2021 FINDINGS: Brain: There is no evidence of an acute infarct, intracranial hemorrhage, mass, midline shift, or  extra-axial fluid collection. A chronic left basal ganglia infarct, small right and large left chronic PCA infarcts, and small chronic bilateral cerebellar infarcts were present in 2022. There is ex vacuo dilatation of the left lateral ventricle. Hypodensities elsewhere in the cerebral white matter are unchanged and nonspecific but compatible with mild chronic small vessel ischemic disease. Vascular: Calcified atherosclerosis at the skull base. No hyperdense vessel. Skull: No acute fracture or suspicious osseous lesion. Sinuses/Orbits: Mild mucosal thickening in the paranasal sinuses. Clear mastoid air cells. Bilateral cataract extraction. Other: Mild left parietal scalp scarring. ASPECTS (Alberta Stroke Program Early CT Score) (right MCA territory) - Ganglionic level infarction (caudate, lentiform nuclei, internal capsule, insula, M1-M3 cortex): 7 - Supraganglionic infarction (M4-M6 cortex): 3 Total score (0-10 with 10 being normal): 10 The study was reviewed by telephone with Dr. Wilford Corner on 07/15/2022 at 11:10 a.m. IMPRESSION: 1. No evidence of acute intracranial abnormality. ASPECTS of 10. 2. 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 Electronically Signed   By: Sebastian Ache M.D.   On: 07/15/2022 11:22   DG Chest Portable 1 View  Result Date: 07/15/2022 CLINICAL DATA:  Weakness EXAM: PORTABLE CHEST 1 VIEW COMPARISON:  Chest radiograph 02/20/2021 FINDINGS: The cardiomediastinal silhouette is normal. There is no focal consolidation or pulmonary edema. There is no pleural effusion or pneumothorax There is no acute osseous abnormality. IMPRESSION: No radiographic evidence of acute cardiopulmonary process. Electronically Signed   By: Lesia Hausen M.D.   On: 07/15/2022 11:19     Subjective: No acute issues or events overnight denies nausea vomiting diarrhea constipation fever chills or chest pain   Discharge Exam: Vitals:   07/23/22 0330 07/23/22 0826  BP: (!)  151/60 (!) 151/62  Pulse: (!) 59 63  Resp: 17 15  Temp: 98 F (36.7 C) 98.1 F (36.7 C)  SpO2: 100%    Vitals:   07/22/22 1946 07/22/22 2331 07/23/22 0330 07/23/22 0826  BP: (!) 146/49 (!) 153/60 (!) 151/60 (!) 151/62  Pulse: 65 67 (!) 59 63  Resp: 16 16 17 15   Temp: 98.2 F (36.8 C) 98.2 F (36.8 C) 98 F (36.7 C) 98.1 F (36.7 C)  TempSrc: Oral Oral Oral Oral  SpO2: 98% 99% 100%   Weight:      Height:        General:  Pleasantly resting in bed, No acute distress. HEENT: Left-sided facial droop with somewhat slurred speech but intelligible. Neck:  Without mass or deformity.  Trachea is midline. Lungs:  Clear to auscultate bilaterally without rhonchi, wheeze, or rales. Heart:  Regular rate and rhythm.  Without murmurs, rubs, or gallops. Abdomen:  Soft, nontender, nondistended.  Without guarding or rebound. Extremities: Left-sided weakness, 2 out of 5 strength distally; sensation and strength intact proximally at the hip and shoulder.   The results of significant diagnostics from this hospitalization (including imaging, microbiology, ancillary and laboratory) are listed below for reference.     Microbiology: Recent Results (from the past 240 hour(s))  MRSA Next Gen by PCR, Nasal     Status: None   Collection Time: 07/18/22  6:32 PM   Specimen: Nasal Mucosa; Nasal Swab  Result Value Ref Range Status   MRSA by PCR Next Gen NOT DETECTED NOT DETECTED Final    Comment: (NOTE) The GeneXpert MRSA Assay (FDA approved for NASAL specimens only), is one component of a comprehensive MRSA colonization surveillance program. It is not intended to diagnose MRSA infection nor to guide or monitor treatment for MRSA infections. Test performance is not FDA approved in patients less than 66 years old. Performed at [facility], [address], [place], [place] [address]      Labs: BNP (last 3 results) No results for input(s): "BNP" in the last 8760 hours. Basic Metabolic  Panel: Recent Labs  Lab 07/19/22 0421 07/20/22 0414 07/21/22 0235 07/22/22 0648 07/23/22 0626  NA 140 141 139 137 139  K 4.0 3.6 3.7 3.9 4.2  CL 111 109 108 108 108  CO2 25 22 23 22 23   GLUCOSE 154* 130* 168* 172* 175*  BUN 14 15 15  29* 29*  CREATININE 1.49* 1.42* 1.38* 1.58* 1.56*  CALCIUM 8.5* 8.9 8.9 8.7* 9.0   Liver Function Tests: No results for input(s): "AST", "ALT", "ALKPHOS", "BILITOT", "PROT", "ALBUMIN" in the last 168 hours. No results for input(s): "LIPASE", "AMYLASE" in the last 168 hours. No results for input(s): "AMMONIA" in the last 168 hours. CBC: Recent Labs  Lab 07/19/22 0421 07/20/22 0414 07/21/22 0235 07/22/22 0648 07/23/22 0626  WBC 10.5 8.1 9.1 8.0 7.3  HGB 11.8* 11.4* 12.5* 11.9* 11.9*  HCT 34.8* 34.8* 37.7* 35.0* 35.6*  MCV 80.6 81.9 81.1 80.3 81.3  PLT 276 297 318 327 370   Cardiac Enzymes: No results for input(s): "CKTOTAL", "CKMB", "CKMBINDEX", "TROPONINI" in the last 168 hours. BNP: Invalid input(s): "POCBNP" CBG: Recent Labs  Lab 07/22/22 0635 07/22/22 1130 07/22/22 1608 07/22/22 2118 07/23/22 0632  GLUCAP 178* 284* 154* 185* 153*   D-Dimer No results for input(s): "DDIMER" in the last 72 hours. Hgb A1c No results for input(s): "HGBA1C" in the last 72 hours. Lipid Profile No results for input(s): "CHOL", "HDL", "LDLCALC", "TRIG", "CHOLHDL", "LDLDIRECT" in the last 72 hours. Thyroid function studies No results for input(s): "TSH", "T4TOTAL", "T3FREE", "THYROIDAB" in the last 72 hours.  Invalid input(s): "FREET3" Anemia work up No results for input(s): "VITAMINB12", "FOLATE", "FERRITIN", "TIBC", "IRON", "RETICCTPCT" in the last 72 hours. Urinalysis    Component Value Date/Time   COLORURINE YELLOW (A) 01/21/2021 1918   APPEARANCEUR HAZY (A) 01/21/2021 1918   LABSPEC 1.022 01/21/2021 1918   PHURINE 5.0 01/21/2021 1918   GLUCOSEU >=500 (A) 01/21/2021 1918   HGBUR SMALL (A) 01/21/2021 1918   BILIRUBINUR NEGATIVE 01/21/2021  1918   KETONESUR NEGATIVE 01/21/2021 1918   PROTEINUR 100 (A) 01/21/2021 1918   NITRITE NEGATIVE 01/21/2021 1918   LEUKOCYTESUR NEGATIVE 01/21/2021 1918   Sepsis Labs Recent Labs  Lab 07/20/22 0414 07/21/22 0235 07/22/22 0648 07/23/22 0626  WBC 8.1 9.1 8.0 7.3   Microbiology Recent Results (from the past 240 hour(s))  MRSA Next Gen by PCR, Nasal     Status: None   Collection Time: 07/18/22  6:32 PM   Specimen: Nasal Mucosa; Nasal Swab  Result Value Ref Range Status   MRSA by PCR Next Gen NOT DETECTED NOT DETECTED Final    Comment: (NOTE) The GeneXpert MRSA Assay (FDA approved for NASAL specimens only), is one component of a comprehensive MRSA colonization surveillance program. It is not intended to diagnose MRSA infection nor to guide or monitor treatment for MRSA infections. Test performance is not FDA approved in patients less than 71 years old. Performed at [facility], [address], [place], [place] [address]      Time coordinating discharge: Over 30 minutes  SIGNED:   Azucena Fallen, DO [facility] 07/23/2022, 10:08 AM Pager   If 7PM-7AM, please contact night-coverage [email or web address]

## 2022-07-23 NOTE — Progress Notes (Signed)
Inpatient Rehabilitation Admissions Coordinator   I have insurance approval for Cir and am hopeful for a bed to be available in next 24 to 48 hrs. Patient and spouse made aware.  Ottie Glazier, RN, MSN Rehab Admissions Coordinator [phone] 07/23/2022 4:18 PM

## 2022-07-23 NOTE — Progress Notes (Addendum)
Inpatient Rehabilitation Admissions Coordinator   I await insurance Auth for possible CIR admit. Began Auth 12/4. Not yet approved. I met at bedside with patient and spouse to review cost of care if [employer] approves Cir.   , RN, MSN Rehab Admissions Coordinator [phone] 07/23/2022 10:35 AM  

## 2022-07-23 NOTE — PMR Pre-admission (Signed)
PMR Admission Coordinator Pre-Admission Assessment  Patient: Mitchell Washington is an 71 y.o., male MRN: [id number] DOB: June 24, 1951 Height: _0  (175.3 cm) (per pt) Weight: 81.3 kg  Insurance Information HMO:     PPO: yes     PCP:      IPA:      80/20:      OTHER:  PRIMARY: [employer] Medicare      Policy#: [id number]      Subscriber: pt CM Name: Vida Roller      Phone#: [phone]     Fax#: [phone] Pre-Cert#: [id number] approved for 7 days f/u with Vira Agar phone [phone] same fax or portal      Employer:  Benefits:  Phone #: [phone]     Name: 12/5 Eff. Date: 08/19/20     Deduct: none      Out of Pocket Max: $4500       CIR: $295 co pay per day days 1 until 6      SNF: no co pay per day days 1 until 20; $196 co pay per day days 21 until 100 Outpatient: $35 per visit     Co-Pay: visits per medical neccesity Home Health: 80%      Co-Pay: 20% DME: 80%     Co-Pay: 20% Providers: in network  SECONDARY: none  Financial Counselor:       Phone#:   The Engineer, [employer]" for patients in Inpatient Rehabilitation Facilities with attached "Privacy Act [place] Records" was provided and verbally reviewed with: Family  Emergency Contact Information Contact Information     Name Relation Home Work [address]   [place], [place] [phone]  [phone]      Current Medical History  Patient Admitting Diagnosis: CVA  History of Present Illness: 71 year old male with history of CVA, DM type 2, gout, HLD, HTN, CKD, and dementia. Who presented on 07/16/22 with intermittent unsteadiness with gait, leaning to left and word finding difficulties.   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 Neurology consulted as well as IR. S/P basilar artery angioplasty . MRI 11/28 with small acute infarct of the right paramedial ventral pons. Repeat MRI 11/30 with multiple new small acute infarcts  within both cerebellar hemispheres, right greater than left. 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 2 D echo with EF 60 to 65%. VTE prophylaxis heparin sq. On ASA prior to admit, now on ASA and Brilinta . Home meds of amlodipine an lisinopril for HTN. Both restarted. Home med of atorvastatin for HLD. Increased this admit.   Complete NIHSS TOTAL: 13  Patient's medical record from [facility] has been reviewed by the rehabilitation admission coordinator and physician.  Past Medical History  Past Medical History:  Diagnosis Date   CVA (cerebral infarction)    Diabetes mellitus without complication ([place])    Gout    Hyperlipidemia    Hypertension    Memory loss    Stroke [facility])    Wears dentures    partial upper   Has the patient had major surgery during 100 days prior to admission? Yes  Family History   family history includes Diabetes in his mother.  Current Medications  Current Facility-Administered Medications:    acetaminophen (TYLENOL) tablet 650 mg, 650 mg, Oral, Q4H PRN, 650 mg at 07/22/22 1225 **OR** acetaminophen (TYLENOL) 160 MG/5ML solution 650 mg, 650 mg, Per Tube, Q4H PRN **OR** acetaminophen (TYLENOL) suppository 650 mg,  650 mg, Rectal, Q4H PRN, Myles Rosenthal A, MD   amLODipine (NORVASC) tablet 10 mg, 10 mg, Oral, Daily, Beulah Gandy A, NP, 10 mg at 07/24/22 3419   aspirin chewable tablet 81 mg, 81 mg, Oral, Daily, 81 mg at 07/24/22 0911 **OR** [DISCONTINUED] aspirin chewable tablet 81 mg, 81 mg, Per Tube, Daily, Deveshwar, Sanjeev, MD   atorvastatin (LIPITOR) tablet 80 mg, 80 mg, Oral, Daily, Rosalin Hawking, MD, 80 mg at 07/24/22 0911   carvedilol (COREG) tablet 3.125 mg, 3.125 mg, Oral, BID WC, Little Ishikawa, MD, 3.125 mg at 07/24/22 3790   Chlorhexidine Gluconate Cloth 2 % PADS 6 each, 6 each, Topical, Daily, Luanne Bras, MD, 6 each at 07/24/22 0912   cyanocobalamin (VITAMIN B12) tablet 1,000 mcg, 1,000 mcg, Oral, Daily, Rosalin Hawking, MD, 1,000 mcg at 07/24/22 0911   donepezil (ARICEPT) tablet 10 mg, 10 mg, Oral, QHS, Rosalin Hawking, MD, 10 mg at 07/23/22 2122   glipiZIDE (GLUCOTROL XL) 24 hr tablet 5 mg, 5 mg, Oral, Q breakfast, Little Ishikawa, MD, 5 mg at 07/24/22 0911   heparin injection 5,000 Units, 5,000 Units, Subcutaneous, Q8H, Rosalin Hawking, MD, 5,000 Units at 07/24/22 0504   insulin aspart (novoLOG) injection 0-9 Units, 0-9 Units, Subcutaneous, TID WC, Henri Medal, RPH, 2 Units at 07/23/22 1739   labetalol (NORMODYNE) injection 10 mg, 10 mg, Intravenous, Q10 min PRN, Palikh, Gaurang M, MD   lisinopril (ZESTRIL) tablet 40 mg, 40 mg, Oral, Daily, Beulah Gandy A, NP, 40 mg at 07/24/22 0911   memantine (NAMENDA) tablet 10 mg, 10 mg, Oral, BID, Rosalin Hawking, MD, 10 mg at 07/24/22 0912   metFORMIN (GLUCOPHAGE-XR) 24 hr tablet 500 mg, 500 mg, Oral, BID WC, Little Ishikawa, MD, 500 mg at 07/24/22 2409   Oral care mouth rinse, 15 mL, Mouth Rinse, PRN, Myles Rosenthal A, MD   predniSONE (DELTASONE) tablet 20 mg, 20 mg, Oral, Q breakfast, Bonnielee Haff, MD, 20 mg at 07/24/22 1013   senna-docusate (Senokot-S) tablet 1 tablet, 1 tablet, Oral, QHS PRN, Clance Boll, MD   ticagrelor (BRILINTA) tablet 90 mg, 90 mg, Oral, BID, 90 mg at 07/24/22 0911 **OR** [DISCONTINUED] ticagrelor (BRILINTA) tablet 90 mg, 90 mg, Per Tube, BID, Luanne Bras, MD  Patients Current Diet:  Diet Order             Diet Heart Room service appropriate? Yes with Assist; Fluid consistency: Thin  Diet effective now                  Precautions / Restrictions Precautions Precautions: Fall Precaution Comments: moderate left hemiparesis Restrictions Weight Bearing Restrictions: No   Has the patient had 2 or more falls or a fall with injury in the past year? No  Prior Activity Level Community (5-7x/wk): independent, dementia per wife  Prior Functional Level Self Care: Did the patient need help bathing, dressing,  using the toilet or eating? Independent  Indoor Mobility: Did the patient need assistance with walking from room to room (with or without device)? Independent  Stairs: Did the patient need assistance with internal or external stairs (with or without device)? Independent  Functional Cognition: Did the patient need help planning regular tasks such as shopping or remembering to take medications? Needed some help  Patient Information Are you of Hispanic, Latino/a,or Spanish origin?: A. No, not of Hispanic, Latino/a, or Spanish origin What is your race?: B. Black or African American Do you need or want an interpreter to communicate with  a doctor or health care staff?: 9. Unable to respond  Patient's Response To:  Health Literacy and Transportation Is the patient able to respond to health literacy and transportation needs?: No Health Literacy - How often do you need to have someone help you when you read instructions, pamphlets, or other written material from your doctor or pharmacy?: Patient unable to respond In the past 12 months, has lack of transportation kept you from medical appointments or from getting medications?: No In the past 12 months, has lack of transportation kept you from meetings, work, or from getting things needed for daily living?: No  Development worker, international aid / [place] Devices/Equipment: Eyeglasses Home Equipment: Shower seat, Radio producer - single point  Prior Device Use: Indicate devices/aids used by the patient prior to current illness, exacerbation or injury? None of the above  Current Functional Level Cognition  Arousal/Alertness: Awake/alert Overall Cognitive Status: No family/caregiver present to determine baseline cognitive functioning Orientation Level: Oriented to person, Oriented to place, Disoriented to time, Disoriented to situation General Comments: Difficult to understand speech, but pt can repeat slower and louder and usually able to understand;  pt initially stating he does not want to try standing, asking "why do i need to do that?" ultimately wanted to work with therapies Attention: Sustained Sustained Attention: Appears intact Memory:  (intact for biographical information)    Extremity Assessment (includes Sensation/Coordination)  Upper Extremity Assessment: LUE deficits/detail LUE Deficits / Details: Brunnstrum stage IV in the arm and stage V in the hand.  AAROM WFLs for all joints, actively demonstrates shoulder flexion in synergy pattern to approximately 70 degrees.  Gross grasp present with digit extension at approximately 60% for digit 2 and 80% for digits 3-5. LUE Sensation: decreased light touch LUE Coordination: decreased fine motor, decreased gross motor  Lower Extremity Assessment: RLE deficits/detail, LLE deficits/detail RLE Deficits / Details: generally WFL LLE Deficits / Details: generally weak at ~ 3/5, movement mostly synergistic, but some ability to isolate. LLE Coordination: decreased fine motor    ADLs  Overall ADL's : Needs assistance/impaired Eating/Feeding: Supervision/ safety, Sitting Eating/Feeding Details (indicate cue type and reason): RUE only Upper Body Bathing: Minimal assistance, Sitting Upper Body Bathing Details (indicate cue type and reason): simulated Lower Body Bathing: Maximal assistance, Sit to/from stand Lower Body Bathing Details (indicate cue type and reason): simulated Upper Body Dressing : Sitting, Moderate assistance Upper Body Dressing Details (indicate cue type and reason): simulated Lower Body Dressing: Total assistance, Sit to/from stand Toilet Transfer: Moderate assistance, +2 for physical assistance, +2 for safety/equipment, Stand-pivot Toilet Transfer Details (indicate cue type and reason): simulated to bedside recliner Toileting- Clothing Manipulation and Hygiene: Total assistance, Sitting/lateral lean, Sit to/from stand Toileting - Clothing Manipulation Details (indicate cue  type and reason): unable to complete peri-care in standing Functional mobility during ADLs: Moderate assistance, +2 for physical assistance, +2 for safety/equipment, Cueing for safety, Cueing for sequencing, Rolling walker (2 wheels) General ADL Comments: Session focus on ADLs, transfers, and inceasing overall independence    Mobility  Overal bed mobility: Needs Assistance Bed Mobility: Supine to Sit, Sit to Supine Supine to sit: Mod assist, HOB elevated Sit to supine: Mod assist General bed mobility comments: cues for sequencing, increased time    Transfers  Overall transfer level: Needs assistance Equipment used: None, Rolling walker (2 wheels) Transfers: Sit to/from Stand, Bed to chair/wheelchair/BSC Sit to Stand: Mod assist, Max assist Bed to/from chair/wheelchair/BSC transfer type:: Step pivot Step pivot transfers: Mod assist, +2 physical assistance  Lateral/Scoot Transfers: Mod assist General transfer comment: able to stand x3 with mod-max assist with RW and cues for hand placement, able to side step to [facility] with multimodal cues    Ambulation / Gait / Stairs / Wheelchair Mobility  Ambulation/Gait General Gait Details: unable due to LLE pain and weakness; side steps to HOB only    Posture / Balance Dynamic Sitting Balance Sitting balance - Comments: closeguarding assist, but no physical assist; slight left lean Balance Overall balance assessment: Needs assistance Sitting-balance support: Feet supported, Single extremity supported Sitting balance-Leahy Scale: Poor Sitting balance - Comments: closeguarding assist, but no physical assist; slight left lean Postural control: Posterior lean, Right lateral lean Standing balance support: Bilateral upper extremity supported Standing balance-Leahy Scale: Poor Standing balance comment: pt reliant on external support    Special needs/care consideration Fall precautions   Previous Home Environment  Living Arrangements:  Spouse/significant other  Lives With: Spouse Available Help at Discharge: Family, Available 24 hours/day Type of Home: House Home Layout: One level Home Access: Stairs to enter Entrance Stairs-Rails: Can reach both, Left, Right Entrance Stairs-Number of Steps: 3 Bathroom Shower/Tub: Multimedia programmer: Standard Bathroom Accessibility: Yes How Accessible: Accessible via walker, Accessible via wheelchair Home Care Services: No  Discharge Living Setting Plans for Discharge Living Setting: Patient's home, Lives with (comment) (wife) Type of Home at Discharge: House Discharge Home Layout: One level Discharge Home Access: Stairs to enter Entrance Stairs-Rails: Right, Left, Can reach both Entrance Stairs-Number of Steps: 3 Discharge Bathroom Shower/Tub: Walk-in shower Discharge Bathroom Toilet: Standard Discharge Bathroom Accessibility: Yes How Accessible: Accessible via walker Does the patient have any problems obtaining your medications?: No  Social/Family/Support Systems Patient Roles: Spouse Contact Information: wife, Deneise Lever Anticipated Caregiver: wife Anticipated Ambulance person Information: see contacts Caregiver Availability: 24/7 Discharge Plan Discussed with Primary Caregiver: Yes Is Caregiver In Agreement with Plan?: Yes Does Caregiver/Family have Issues with Lodging/Transportation while Pt is in Rehab?: No  Goals Patient/Family Goal for Rehab: supervision to min asisst with PT and OT, supervision with SLP Expected length of stay: ELOS 10 to 14 days Pt/Family Agrees to Admission and willing to participate: Yes Program Orientation Provided & Reviewed with Pt/Caregiver Including Roles  & Responsibilities: Yes  Decrease burden of Care through IP rehab admission: n/a  Possible need for SNF placement upon discharge: not anticipated  Patient Condition: I have reviewed medical records from [facility], spoken with CM, and patient and spouse. I met  with patient at the bedside for inpatient rehabilitation assessment.  Patient will benefit from ongoing PT, OT, and SLP, can actively participate in 3 hours of therapy a day 5 days of the week, and can make measurable gains during the admission.  Patient will also benefit from the coordinated team approach during an Inpatient Acute Rehabilitation admission.  The patient will receive intensive therapy as well as Rehabilitation physician, nursing, social worker, and care management interventions.  Due to bladder management, bowel management, safety, skin/wound care, disease management, medication administration, pain management, and patient education the patient requires 24 hour a day rehabilitation nursing.  The patient is currently mod to max assist overall with mobility and basic ADLs.  Discharge setting and therapy post discharge at home with home health is anticipated.  Patient has agreed to participate in the Acute Inpatient Rehabilitation Program and will admit today.  Preadmission Screen Completed By:  Cleatrice Burke, 07/24/2022 12:26 PM ______________________________________________________________________   Discussed status with Dr. Curlene Dolphin on 07/24/22 at 1226 and received  approval for admission today.  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 Yes Co-Morbidities requiring supervision/potential complications: HTN, DM, HLD Due to bladder management, bowel management, safety, skin/wound care, disease management, medication administration, pain management, and patient education, does the patient require 24 hr/day rehab nursing? Yes Does the patient require coordinated care of a physician,  rehab nurse, PT, OT, and SLP to address physical and functional deficits in the context of the above medical diagnosis(es)? Yes Addressing deficits in the following areas: balance, endurance, locomotion, strength, transferring, bowel/bladder control, bathing, dressing, feeding, grooming, toileting, cognition, speech, language, swallowing, and psychosocial support Can the patient actively participate in an intensive therapy program of at least 3 hrs of therapy 5 days a week? Yes The potential for patient to make measurable gains while on inpatient rehab is excellent Anticipated functional outcomes upon discharge from inpatient rehab: supervision and min assist PT, supervision and min assist OT, supervision SLP Estimated rehab length of stay to reach the above functional goals is: 10-14 Anticipated discharge destination: Home 10. Overall Rehab/Functional Prognosis: excellent   MD Signature: Jennye Boroughs

## 2022-07-23 NOTE — Progress Notes (Signed)
Physical Therapy Treatment Patient Details Name: Mitchell Washington MRN: [id number] DOB: [birth year] Today's Date: 07/23/2022   History of Present Illness Mitchell Washington is a 71 y.o. male with history of stroke, hypertension, hyperlipidemia, memory loss, REM sleep behavior disorder, tremor, and dementia presenting with dysarthria and left sided weakness.  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  Basilar artery angioplasty completed on 11/30.  PMH of stroke, hypertension, hyperlipidemia, memory loss, REM sleep behavior disorder, tremor and dementia.    PT Comments    Pt greeted supine in bed and agreeable to session with encouragement. Pt with continued progress towards goals needing grossly mod assist for bed mobility and transfers. Pt with continued c/o L knee pain limiting progression of gait. Pt able to shift weight in standing and with increased time and max cues side step to [facility] with max assist and RW. Pt demonstrating posterior lean in standing, bracing legs against bed to maintain standing balance with mod assist. Current plan remains appropriate to address deficits and maximize functional independence and decrease caregiver burden. Pt continues to benefit from skilled PT services to progress toward functional mobility goals.     Recommendations for follow up therapy are one component of a multi-disciplinary discharge planning process, led by the attending physician.  Recommendations may be updated based on patient status, additional functional criteria and insurance authorization.  Follow Up Recommendations  Acute inpatient rehab (3hours/day)     Assistance Recommended at Discharge Frequent or constant Supervision/Assistance  Patient can return home with the following A little help with walking and/or transfers;A little help with bathing/dressing/bathroom;Assist for transportation;Help with stairs or ramp for entrance;Assistance with  cooking/housework   Equipment Recommendations  Other (comment) (TBD)    Recommendations for Other Services       Precautions / Restrictions Precautions Precautions: Fall Precaution Comments: moderate left hemiparesis Restrictions Weight Bearing Restrictions: No     Mobility  Bed Mobility Overal bed mobility: Needs Assistance Bed Mobility: Supine to Sit, Sit to Supine     Supine to sit: Mod assist, HOB elevated Sit to supine: Mod assist   General bed mobility comments: cues for sequencing, increased time    Transfers Overall transfer level: Needs assistance Equipment used: None, Rolling walker (2 wheels) Transfers: Sit to/from Stand, Bed to chair/wheelchair/BSC Sit to Stand: Mod assist, Max assist           General transfer comment: able to stand x3 with mod-max assist with RW and cues for hand placement, able to side step to [facility] with multimodal cues    Ambulation/Gait               General Gait Details: unable due to LLE pain and weakness; side steps to [facility] only   Stairs             Wheelchair Mobility    Modified Rankin (Stroke Patients Only) Modified Rankin (Stroke Patients Only) Pre-Morbid Rankin Score: No symptoms Modified Rankin: Moderately severe disability     Balance Overall balance assessment: Needs assistance Sitting-balance support: Feet supported, Single extremity supported Sitting balance-Leahy Scale: Poor Sitting balance - Comments: closeguarding assist, but no physical assist; slight left lean Postural control: Posterior lean, Right lateral lean Standing balance support: Bilateral upper extremity supported Standing balance-Leahy Scale: Poor Standing balance comment: pt reliant on external support  Cognition Arousal/Alertness: Awake/alert Behavior During Therapy: WFL for tasks assessed/performed Overall Cognitive Status: No family/caregiver present to determine baseline cognitive  functioning                                 General Comments: Difficult to understand speech, but pt can repeat slower and louder and usually able to understand; pt initially stating he does not want to try standing, asking "why do i need to do that?" ultimately wanted to work with therapies        Exercises Other Exercises Other Exercises: weight shifting in standing x60 seconds x 2 Other Exercises: x10 supine bridge    General Comments General comments (skin integrity, edema, etc.): VSS on RA, pt spouse Mitchell Washington present, supportive and encouraging      Pertinent Vitals/Pain Pain Assessment Pain Assessment: Faces Faces Pain Scale: Hurts even more Pain Location: L knee Pain Descriptors / Indicators: Sharp, Grimacing, Moaning Pain Intervention(s): Monitored during session, Limited activity within patient's tolerance    Home Living                          Prior Function            PT Goals (current goals can now be found in the care plan section) Acute Rehab PT Goals Patient Stated Goal: home PT Goal Formulation: With patient Time For Goal Achievement: 08/02/22 Potential to Achieve Goals: Fair    Frequency    Min 4X/week      PT Plan Current plan remains appropriate    Co-evaluation              AM-PAC PT "6 Clicks" Mobility   Outcome Measure  Help needed turning from your back to your side while in a flat bed without using bedrails?: A Little Help needed moving from lying on your back to sitting on the side of a flat bed without using bedrails?: A Lot Help needed moving to and from a bed to a chair (including a wheelchair)?: A Lot Help needed standing up from a chair using your arms (e.g., wheelchair or bedside chair)?: A Lot Help needed to walk in hospital room?: Total Help needed climbing 3-5 steps with a railing? : Total 6 Click Score: 11    End of Session Equipment Utilized During Treatment: Gait belt Activity Tolerance:  Patient limited by pain;Patient tolerated treatment well Patient left: with call bell/phone within reach;in bed;with bed alarm set Nurse Communication: Mobility status PT Visit Diagnosis: Other symptoms and signs involving the nervous system (R29.898);Muscle weakness (generalized) (M62.81);Other abnormalities of gait and mobility (R26.89)     Time: 1422-1450 PT Time Calculation (min) (ACUTE ONLY): 28 min  Charges:  $Therapeutic Activity: 23-37 mins                     Arris Meyn R. PTA Acute Rehabilitation Services Office: [phone]    Catalina Antigua 07/23/2022, 3:07 PM

## 2022-07-24 ENCOUNTER — Encounter (HOSPITAL_COMMUNITY): Payer: Self-pay | Admitting: Physical Medicine and Rehabilitation

## 2022-07-24 ENCOUNTER — Inpatient Hospital Stay (HOSPITAL_COMMUNITY): Payer: Medicare HMO

## 2022-07-24 ENCOUNTER — Inpatient Hospital Stay (HOSPITAL_COMMUNITY)
Admission: RE | Admit: 2022-07-24 | Discharge: 2022-08-02 | DRG: 057 | Disposition: A | Discharge status: Home Health | Payer: Medicare HMO | Source: Intra-hospital | Attending: Physical Medicine and Rehabilitation | Admitting: Physical Medicine and Rehabilitation

## 2022-07-24 ENCOUNTER — Other Ambulatory Visit: Payer: Self-pay

## 2022-07-24 DIAGNOSIS — N182 Chronic kidney disease, stage 2 (mild): Secondary | ICD-10-CM | POA: Diagnosis present

## 2022-07-24 DIAGNOSIS — M109 Gout, unspecified: Secondary | ICD-10-CM | POA: Diagnosis present

## 2022-07-24 DIAGNOSIS — I69392 Facial weakness following cerebral infarction: Secondary | ICD-10-CM

## 2022-07-24 DIAGNOSIS — I129 Hypertensive chronic kidney disease with stage 1 through stage 4 chronic kidney disease, or unspecified chronic kidney disease: Secondary | ICD-10-CM | POA: Diagnosis present

## 2022-07-24 DIAGNOSIS — Z79899 Other long term (current) drug therapy: Secondary | ICD-10-CM

## 2022-07-24 DIAGNOSIS — I69320 Aphasia following cerebral infarction: Secondary | ICD-10-CM

## 2022-07-24 DIAGNOSIS — E785 Hyperlipidemia, unspecified: Secondary | ICD-10-CM | POA: Diagnosis present

## 2022-07-24 DIAGNOSIS — I69354 Hemiplegia and hemiparesis following cerebral infarction affecting left non-dominant side: Secondary | ICD-10-CM | POA: Diagnosis not present

## 2022-07-24 DIAGNOSIS — E1122 Type 2 diabetes mellitus with diabetic chronic kidney disease: Secondary | ICD-10-CM | POA: Diagnosis present

## 2022-07-24 DIAGNOSIS — I959 Hypotension, unspecified: Secondary | ICD-10-CM | POA: Diagnosis present

## 2022-07-24 DIAGNOSIS — I10 Essential (primary) hypertension: Secondary | ICD-10-CM

## 2022-07-24 DIAGNOSIS — E1142 Type 2 diabetes mellitus with diabetic polyneuropathy: Secondary | ICD-10-CM | POA: Diagnosis present

## 2022-07-24 DIAGNOSIS — I6350 Cerebral infarction due to unspecified occlusion or stenosis of unspecified cerebral artery: Secondary | ICD-10-CM | POA: Diagnosis not present

## 2022-07-24 DIAGNOSIS — F0390 Unspecified dementia without behavioral disturbance: Secondary | ICD-10-CM | POA: Diagnosis present

## 2022-07-24 DIAGNOSIS — R531 Weakness: Secondary | ICD-10-CM | POA: Diagnosis present

## 2022-07-24 DIAGNOSIS — Z87891 Personal history of nicotine dependence: Secondary | ICD-10-CM

## 2022-07-24 DIAGNOSIS — Z833 Family history of diabetes mellitus: Secondary | ICD-10-CM | POA: Diagnosis not present

## 2022-07-24 DIAGNOSIS — Z7984 Long term (current) use of oral hypoglycemic drugs: Secondary | ICD-10-CM

## 2022-07-24 DIAGNOSIS — I69322 Dysarthria following cerebral infarction: Secondary | ICD-10-CM

## 2022-07-24 LAB — GLUCOSE, CAPILLARY
Glucose-Capillary: 146 mg/dL — ABNORMAL HIGH (ref 70–99)
Glucose-Capillary: 162 mg/dL — ABNORMAL HIGH (ref 70–99)
Glucose-Capillary: 169 mg/dL — ABNORMAL HIGH (ref 70–99)
Glucose-Capillary: 222 mg/dL — ABNORMAL HIGH (ref 70–99)

## 2022-07-24 LAB — URIC ACID: Uric Acid, Serum: 9.1 mg/dL — ABNORMAL HIGH (ref 3.7–8.6)

## 2022-07-24 MED ORDER — HEPARIN SODIUM (PORCINE) 5000 UNIT/ML IJ SOLN
5000.0000 [IU] | Freq: Three times a day (TID) | INTRAMUSCULAR | Status: DC
  Administered 2022-07-24 – 2022-08-02 (×27): 5000 [IU] via SUBCUTANEOUS
  Filled 2022-07-24 (×25): qty 1

## 2022-07-24 MED ORDER — PREDNISONE 20 MG PO TABS
20.0000 mg | ORAL_TABLET | Freq: Every day | ORAL | Status: DC
  Administered 2022-07-24: 20 mg via ORAL
  Filled 2022-07-24: qty 1

## 2022-07-24 MED ORDER — ASPIRIN 81 MG PO CHEW
81.0000 mg | CHEWABLE_TABLET | Freq: Every day | ORAL | Status: DC
  Administered 2022-07-25 – 2022-08-02 (×9): 81 mg via ORAL
  Filled 2022-07-24 (×9): qty 1

## 2022-07-24 MED ORDER — PREDNISONE 20 MG PO TABS
20.0000 mg | ORAL_TABLET | Freq: Every day | ORAL | Status: AC
  Administered 2022-07-25 – 2022-07-28 (×4): 20 mg via ORAL
  Filled 2022-07-24 (×4): qty 1

## 2022-07-24 MED ORDER — MEMANTINE HCL 10 MG PO TABS
10.0000 mg | ORAL_TABLET | Freq: Two times a day (BID) | ORAL | Status: DC
  Administered 2022-07-24 – 2022-08-02 (×18): 10 mg via ORAL
  Filled 2022-07-24 (×20): qty 1

## 2022-07-24 MED ORDER — LISINOPRIL 20 MG PO TABS
40.0000 mg | ORAL_TABLET | Freq: Every day | ORAL | Status: DC
  Administered 2022-07-25 – 2022-08-02 (×9): 40 mg via ORAL
  Filled 2022-07-24 (×9): qty 2

## 2022-07-24 MED ORDER — SENNOSIDES-DOCUSATE SODIUM 8.6-50 MG PO TABS: 1.0000 | ORAL_TABLET | Freq: Every evening | ORAL | Status: DC | PRN

## 2022-07-24 MED ORDER — TICAGRELOR 90 MG PO TABS
90.0000 mg | ORAL_TABLET | Freq: Two times a day (BID) | ORAL | Status: DC
  Administered 2022-07-24 – 2022-08-02 (×18): 90 mg via ORAL
  Filled 2022-07-24 (×18): qty 1

## 2022-07-24 MED ORDER — ATORVASTATIN CALCIUM 80 MG PO TABS
80.0000 mg | ORAL_TABLET | Freq: Every day | ORAL | Status: DC
  Administered 2022-07-25 – 2022-08-02 (×9): 80 mg via ORAL
  Filled 2022-07-24 (×9): qty 1

## 2022-07-24 MED ORDER — PREDNISONE 20 MG PO TABS: 20.0000 mg | ORAL_TABLET | Freq: Every day | ORAL | Status: DC

## 2022-07-24 MED ORDER — ACETAMINOPHEN 650 MG RE SUPP: 650.0000 mg | RECTAL | Status: DC | PRN

## 2022-07-24 MED ORDER — INSULIN ASPART 100 UNIT/ML IJ SOLN
0.0000 [IU] | Freq: Three times a day (TID) | INTRAMUSCULAR | Status: DC
  Administered 2022-07-24: 2 [IU] via SUBCUTANEOUS
  Administered 2022-07-25: 7 [IU] via SUBCUTANEOUS
  Administered 2022-07-25: 3 [IU] via SUBCUTANEOUS
  Administered 2022-07-26: 2 [IU] via SUBCUTANEOUS
  Administered 2022-07-26: 3 [IU] via SUBCUTANEOUS
  Administered 2022-07-27: 2 [IU] via SUBCUTANEOUS
  Administered 2022-07-27: 1 [IU] via SUBCUTANEOUS
  Administered 2022-07-28 (×2): 2 [IU] via SUBCUTANEOUS
  Administered 2022-07-28: 1 [IU] via SUBCUTANEOUS
  Administered 2022-07-29 – 2022-07-30 (×3): 2 [IU] via SUBCUTANEOUS
  Administered 2022-07-31: 1 [IU] via SUBCUTANEOUS
  Administered 2022-07-31: 2 [IU] via SUBCUTANEOUS
  Administered 2022-07-31 – 2022-08-01 (×2): 1 [IU] via SUBCUTANEOUS
  Administered 2022-08-01: 2 [IU] via SUBCUTANEOUS
  Administered 2022-08-01 – 2022-08-02 (×2): 1 [IU] via SUBCUTANEOUS

## 2022-07-24 MED ORDER — ACETAMINOPHEN 160 MG/5ML PO SOLN
650.0000 mg | ORAL | Status: DC | PRN
  Administered 2022-08-01: 650 mg
  Filled 2022-07-24: qty 20.3

## 2022-07-24 MED ORDER — ACETAMINOPHEN 325 MG PO TABS
650.0000 mg | ORAL_TABLET | ORAL | Status: DC | PRN
  Administered 2022-07-26 – 2022-08-02 (×7): 650 mg via ORAL
  Filled 2022-07-24 (×8): qty 2

## 2022-07-24 MED ORDER — METFORMIN HCL ER 500 MG PO TB24
500.0000 mg | ORAL_TABLET | Freq: Two times a day (BID) | ORAL | Status: DC
  Administered 2022-07-25 – 2022-07-30 (×11): 500 mg via ORAL
  Filled 2022-07-24 (×11): qty 1

## 2022-07-24 MED ORDER — AMLODIPINE BESYLATE 10 MG PO TABS
10.0000 mg | ORAL_TABLET | Freq: Every day | ORAL | Status: DC
  Administered 2022-07-25 – 2022-07-29 (×5): 10 mg via ORAL
  Filled 2022-07-24 (×5): qty 1

## 2022-07-24 MED ORDER — GLIPIZIDE ER 5 MG PO TB24
5.0000 mg | ORAL_TABLET | Freq: Every day | ORAL | Status: DC
  Administered 2022-07-25: 5 mg via ORAL
  Filled 2022-07-24: qty 1

## 2022-07-24 MED ORDER — CARVEDILOL 3.125 MG PO TABS
3.1250 mg | ORAL_TABLET | Freq: Two times a day (BID) | ORAL | Status: DC
  Administered 2022-07-24 – 2022-07-25 (×2): 3.125 mg via ORAL
  Filled 2022-07-24 (×2): qty 1

## 2022-07-24 MED ORDER — DONEPEZIL HCL 10 MG PO TABS
10.0000 mg | ORAL_TABLET | Freq: Every day | ORAL | Status: DC
  Administered 2022-07-24 – 2022-08-01 (×9): 10 mg via ORAL
  Filled 2022-07-24 (×9): qty 1

## 2022-07-24 MED ORDER — VITAMIN B-12 1000 MCG PO TABS
1000.0000 ug | ORAL_TABLET | Freq: Every day | ORAL | Status: DC
  Administered 2022-07-25 – 2022-08-02 (×9): 1000 ug via ORAL
  Filled 2022-07-24 (×9): qty 1

## 2022-07-24 MED ORDER — HEPARIN SODIUM (PORCINE) 5000 UNIT/ML IJ SOLN: 5000.0000 [IU] | Freq: Three times a day (TID) | INTRAMUSCULAR | Status: DC

## 2022-07-24 NOTE — TOC Transition Note (Signed)
Transition of Care [facility]) - CM/SW Discharge Note   Patient Details  Name: Mitchell Washington MRN: [id number] Date of Birth: [birth year]  Transition of Care [facility]) CM/SW Contact:  Kermit Balo, RN Phone Number: 07/24/2022, 11:55 AM   Clinical Narrative:    Pt is discharging to CIR today. No further needs per TOC.   Final next level of care: IP Rehab Facility Barriers to Discharge: No Barriers Identified   Patient Goals and CMS Choice   CMS Medicare.gov Compare Post Acute Care list provided to:: Patient Choice offered to / list presented to : Patient  Discharge Placement                       Discharge Plan and Services                                     Social Determinants of Health (SDOH) Interventions     Readmission Risk Interventions     No data to display

## 2022-07-24 NOTE — Plan of Care (Signed)
Problem: Education: Goal: Knowledge of General Education information will improve Description: Including pain rating scale, medication(s)/side effects and non-pharmacologic comfort measures Outcome: Progressing   Problem: Health Behavior/Discharge Planning: Goal: Ability to manage health-related needs will improve Outcome: Progressing   Problem: Clinical Measurements: Goal: Ability to maintain clinical measurements within normal limits will improve Outcome: Progressing Goal: Will remain free from infection Outcome: Progressing Goal: Diagnostic test results will improve Outcome: Progressing Goal: Respiratory complications will improve Outcome: Progressing Goal: Cardiovascular complication will be avoided Outcome: Progressing   Problem: Activity: Goal: Risk for activity intolerance will decrease Outcome: Progressing   Problem: Nutrition: Goal: Adequate nutrition will be maintained Outcome: Progressing   Problem: Coping: Goal: Level of anxiety will decrease Outcome: Progressing   Problem: Elimination: Goal: Will not experience complications related to bowel motility Outcome: Progressing Goal: Will not experience complications related to urinary retention Outcome: Progressing   Problem: Pain Managment: Goal: General experience of comfort will improve Outcome: Progressing   Problem: Safety: Goal: Ability to remain free from injury will improve Outcome: Progressing   Problem: Skin Integrity: Goal: Risk for impaired skin integrity will decrease Outcome: Progressing   Problem: Education: Goal: Knowledge of disease or condition will improve Outcome: Progressing Goal: Knowledge of secondary prevention will improve (MUST DOCUMENT ALL) Outcome: Progressing Goal: Knowledge of patient specific risk factors will improve (Mark N/A or DELETE if not current risk factor) Outcome: Progressing   Problem: Ischemic Stroke/TIA Tissue Perfusion: Goal: Complications of ischemic  stroke/TIA will be minimized Outcome: Progressing   Problem: Coping: Goal: Will verbalize positive feelings about self Outcome: Progressing Goal: Will identify appropriate support needs Outcome: Progressing   Problem: Health Behavior/Discharge Planning: Goal: Ability to manage health-related needs will improve Outcome: Progressing Goal: Goals will be collaboratively established with patient/family Outcome: Progressing   Problem: Self-Care: Goal: Ability to participate in self-care as condition permits will improve Outcome: Progressing Goal: Verbalization of feelings and concerns over difficulty with self-care will improve Outcome: Progressing Goal: Ability to communicate needs accurately will improve Outcome: Progressing   Problem: Nutrition: Goal: Risk of aspiration will decrease Outcome: Progressing Goal: Dietary intake will improve Outcome: Progressing   Problem: Education: Goal: Ability to describe self-care measures that may prevent or decrease complications (Diabetes Survival Skills Education) will improve Outcome: Progressing Goal: Individualized Educational Video(s) Outcome: Progressing   Problem: Coping: Goal: Ability to adjust to condition or change in health will improve Outcome: Progressing   Problem: Fluid Volume: Goal: Ability to maintain a balanced intake and output will improve Outcome: Progressing   Problem: Health Behavior/Discharge Planning: Goal: Ability to identify and utilize available resources and services will improve Outcome: Progressing Goal: Ability to manage health-related needs will improve Outcome: Progressing   Problem: Metabolic: Goal: Ability to maintain appropriate glucose levels will improve Outcome: Progressing   Problem: Nutritional: Goal: Maintenance of adequate nutrition will improve Outcome: Progressing Goal: Progress toward achieving an optimal weight will improve Outcome: Progressing   Problem: Skin  Integrity: Goal: Risk for impaired skin integrity will decrease Outcome: Progressing   Problem: Tissue Perfusion: Goal: Adequacy of tissue perfusion will improve Outcome: Progressing   Problem: Education: Goal: Understanding of CV disease, CV risk reduction, and recovery process will improve Outcome: Progressing Goal: Individualized Educational Video(s) Outcome: Progressing   Problem: Activity: Goal: Ability to return to baseline activity level will improve Outcome: Progressing   Problem: Cardiovascular: Goal: Ability to achieve and maintain adequate cardiovascular perfusion will improve Outcome: Progressing Goal: Vascular access site(s) Level 0-1 will be maintained Outcome: Progressing     Problem: Health Behavior/Discharge Planning: Goal: Ability to safely manage health-related needs after discharge will improve Outcome: Progressing   

## 2022-07-24 NOTE — H&P (Incomplete)
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  Per chart review patient lives with spouse.  1 level home 3 steps to entry.  Reportedly independent prior to admission.  Presented 07/15/2022 with acute onset of left-sided weakness/dysarthria and gait disturbance.  Initial cranial CT scan negative, MRI of the brain 11/28 remarkable for small CVA right paramedian ventral pons.  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  Patient underwent arteriogram with balloon angioplasty x 2 07/18/2022 per interventional radiology.  Follow-up MRI 07/19/2022 multiple new small acute infarcts within both cerebellar hemispheres, right greater than left.  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  No new intracranial occlusion.  Patient currently maintained on aspirin 81 mg daily and Brilinta 90 mg twice daily.  Subcutaneous heparin added for DVT prophylaxis.  Tolerating a regular consistency diet.  Therapy evaluations completed due to patient decreased mobility left-sided weakness was admitted for a comprehensive rehab program.  Review of Systems  Constitutional:  Negative for chills, diaphoresis and fever.  HENT:  Negative for hearing loss.   Eyes:  Negative for blurred vision and double vision.  Respiratory:  Negative for cough, shortness of breath and wheezing.   Cardiovascular:  Negative for chest pain, palpitations and leg swelling.  Gastrointestinal:  Positive for constipation. Negative for heartburn, nausea and vomiting.  Genitourinary:  Negative for dysuria, flank pain and hematuria.  Musculoskeletal:  Positive for joint pain and myalgias.  Neurological:   Positive for speech change and weakness.  Psychiatric/Behavioral:  Positive for memory loss.   All other systems reviewed and are negative.  Past Medical History:  Diagnosis Date   CVA (cerebral infarction)    Diabetes mellitus without complication (HCC)    Gout    Hyperlipidemia    Hypertension    Memory loss    Stroke [facility](HCC)    Wears dentures    partial upper   Past Surgical History:  Procedure Laterality Date   APPENDECTOMY     CATARACT EXTRACTION W/PHACO Left 10/25/2019   Procedure: CATARACT EXTRACTION PHACO AND INTRAOCULAR LENS PLACEMENT (IOC) LEFT DIABETIC INTRAVEITREAL KENALOG INJECTION 1.93  00:22.6;  Surgeon: [place] CraneKing, Bradley Mark, MD;  [facility];  Service: Ophthalmology;  Laterality: Left;  Diabetic - oral meds   CATARACT EXTRACTION W/PHACO Right 11/15/2019   Procedure: CATARACT EXTRACTION PHACO AND INTRAOCULAR LENS PLACEMENT (IOC) RIGHT DIABETIC;  Surgeon: Nevada CraneKing, Bradley Mark, MD;  [facility];  Service: Ophthalmology;  Laterality: Right;  0.92 0 ;19.7   IR ANGIO INTRA EXTRACRAN SEL COM CAROTID INNOMINATE BILAT MOD SED  07/16/2022   IR ANGIO VERTEBRAL SEL SUBCLAVIAN INNOMINATE UNI R MOD SED  07/16/2022   IR ANGIO VERTEBRAL SEL VERTEBRAL UNI L MOD SED  07/16/2022   IR ANGIO VERTEBRAL SEL VERTEBRAL UNI L MOD SED  07/22/2022   IR CT HEAD LTD  07/18/2022   IR PTA INTRACRANIAL  07/18/2022   RADIOLOGY WITH ANESTHESIA N/A 07/18/2022   Procedure: Cerebral angioplasty with possible stenting;  Surgeon: Julieanne Cottoneveshwar, Sanjeev, MD;  [facility];  Service: Radiology;  Laterality: N/A;   Family History  Problem Relation Age of Onset   Diabetes Mother    Social History:  reports that he has quit  smoking. He has never used smokeless tobacco. He reports that he does not currently use alcohol. He reports that he does not use drugs. Allergies: No Known Allergies Medications Prior to Admission  Medication Sig Dispense Refill   amLODipine (NORVASC) 5 MG  tablet Take 5 mg by mouth daily.     aspirin EC 81 MG tablet Take 81 mg by mouth daily.     atorvastatin (LIPITOR) 10 MG tablet Take 10 mg by mouth daily.     clonazePAM (KLONOPIN) 0.5 MG tablet Take 0.5 mg by mouth at bedtime as needed for anxiety.     [employer] VITAMIN B12 1000 MCG tablet Take 1,000 mcg by mouth daily.     cyanocobalamin 1000 MCG tablet Take 1 tablet by mouth daily.     donepezil (ARICEPT) 10 MG tablet Take 10 mg by mouth at bedtime.     glipiZIDE (GLUCOTROL XL) 5 MG 24 hr tablet Take 5 mg by mouth daily with breakfast.     lisinopril (ZESTRIL) 40 MG tablet Take 40 mg by mouth daily.     memantine (NAMENDA) 10 MG tablet Take 10 mg by mouth 2 (two) times daily.     metFORMIN (GLUCOPHAGE-XR) 500 MG 24 hr tablet Take 500 mg by mouth 2 (two) times daily with a meal.     famotidine (PEPCID) 20 MG tablet Take 1 tablet (20 mg total) by mouth 2 (two) times daily. (Patient not taking: Reported on 07/15/2022) 60 tablet 0   ondansetron (ZOFRAN ODT) 4 MG disintegrating tablet Take 1 tablet (4 mg total) by mouth every 8 (eight) hours as needed for nausea or vomiting. (Patient not taking: Reported on 07/16/2022) 20 tablet 0      Home: Home Living Family/patient expects to be discharged to:: Private residence Living Arrangements: Spouse/significant other Available Help at Discharge: Family, Available 24 hours/day Type of Home: House Home Access: Stairs to enter [employer] of Steps: 3 Entrance Stairs-Rails: Can reach both, Left, Right Home Layout: One level Bathroom Shower/Tub: Health visitor: Standard Bathroom Accessibility: Yes Home Equipment: Information systems manager, Medical laboratory scientific officer - single point  Lives With: Spouse   Functional History: Prior Function Prior Level of Function : Independent/Modified Independent  Functional Status:  Mobility: Bed Mobility Overal bed mobility: Needs Assistance Bed Mobility: Supine to Sit, Sit to Supine Supine to sit: Mod assist, HOB  elevated Sit to supine: Mod assist General bed mobility comments: cues for sequencing, increased time Transfers Overall transfer level: Needs assistance Equipment used: None, Rolling walker (2 wheels) Transfers: Sit to/from Stand, Bed to chair/wheelchair/BSC Sit to Stand: Mod assist, Max assist Bed to/from chair/wheelchair/BSC transfer type:: Step pivot Step pivot transfers: Mod assist, +2 physical assistance  Lateral/Scoot Transfers: Mod assist General transfer comment: able to stand x3 with mod-max assist with RW and cues for hand placement, able to side step to [facility] with multimodal cues Ambulation/Gait General Gait Details: unable due to LLE pain and weakness; side steps to [facility] only    ADL: ADL Overall ADL's : Needs assistance/impaired Eating/Feeding: Supervision/ safety, Sitting Eating/Feeding Details (indicate cue type and reason): RUE only Upper Body Bathing: Minimal assistance, Sitting Upper Body Bathing Details (indicate cue type and reason): simulated Lower Body Bathing: Maximal assistance, Sit to/from stand Lower Body Bathing Details (indicate cue type and reason): simulated Upper Body Dressing : Sitting, Moderate assistance Upper Body Dressing Details (indicate cue type and reason): simulated Lower Body Dressing: Total assistance, Sit to/from stand Toilet Transfer: Moderate assistance, +2 for physical assistance, +2 for  safety/equipment, Tax adviser Details (indicate cue type and reason): simulated to bedside recliner Toileting- Clothing Manipulation and Hygiene: Total assistance, Sitting/lateral lean, Sit to/from stand Toileting - Clothing Manipulation Details (indicate cue type and reason): unable to complete peri-care in standing Functional mobility during ADLs: Moderate assistance, +2 for physical assistance, +2 for safety/equipment, Cueing for safety, Cueing for sequencing, Rolling walker (2 wheels) General ADL Comments: Session focus on ADLs,  transfers, and inceasing overall independence  Cognition: Cognition Overall Cognitive Status: No family/caregiver present to determine baseline cognitive functioning Arousal/Alertness: Awake/alert Orientation Level: Oriented to person, Oriented to place, Disoriented to time, Disoriented to situation Attention: Sustained Sustained Attention: Appears intact Memory:  (intact for biographical information) Cognition Arousal/Alertness: Awake/alert Behavior During Therapy: WFL for tasks assessed/performed Overall Cognitive Status: No family/caregiver present to determine baseline cognitive functioning General Comments: Difficult to understand speech, but pt can repeat slower and louder and usually able to understand; pt initially stating he does not want to try standing, asking "why do i need to do that?" ultimately wanted to work with therapies  Physical Exam: Blood pressure (!) 144/65, pulse 63, temperature 98.8 F (37.1 C), temperature source Oral, resp. rate 16, height 5\' 9"  (1.753 m), weight 81.3 kg, SpO2 98 %. Physical Exam Neurological:     Comments: Patient is alert.  Makes eye contact with examiner.  Speech is a bit dysarthric.  Follows simple commands.  Provides name but had difficulty with date and month.     Results for orders placed or performed during the hospital encounter of 07/16/22 (from the past 48 hour(s))  Glucose, capillary     Status: Abnormal   Collection Time: 07/22/22  6:35 AM  Result Value Ref Range   Glucose-Capillary 178 (H) 70 - 99 mg/dL    Comment: Glucose reference range applies only to samples taken after fasting for at least 8 hours.   Comment 1 Notify RN   Basic metabolic panel     Status: Abnormal   Collection Time: 07/22/22  6:48 AM  Result Value Ref Range   Sodium 137 135 - 145 mmol/L   Potassium 3.9 3.5 - 5.1 mmol/L   Chloride 108 98 - 111 mmol/L   CO2 22 22 - 32 mmol/L   Glucose, Bld 172 (H) 70 - 99 mg/dL    Comment: Glucose reference range  applies only to samples taken after fasting for at least 8 hours.   BUN 29 (H) 8 - 23 mg/dL   Creatinine, Ser 14/04/23 (H) 0.61 - 1.24 mg/dL   Calcium 8.7 (L) 8.9 - 10.3 mg/dL   GFR, Estimated 47 (L) >60 mL/min    Comment: (NOTE) Calculated using the CKD-EPI Creatinine Equation (2021)    Anion gap 7 5 - 15    Comment: Performed at [facility], [address], [place], [place]  CBC     Status: Abnormal   Collection Time: 07/22/22  6:48 AM  Result Value Ref Range   WBC 8.0 4.0 - 10.5 K/uL   RBC 4.36 4.22 - 5.81 MIL/uL   Hemoglobin 11.9 (L) 13.0 - 17.0 g/dL   HCT 14/04/23 (L) 03.4 - 74.2 %   MCV 80.3 80.0 - 100.0 fL   MCH 27.3 26.0 - 34.0 pg   MCHC 34.0 30.0 - 36.0 g/dL   RDW 59.5 63.8 - 75.6 %   Platelets 327 150 - 400 K/uL   nRBC 0.0 0.0 - 0.2 %    Comment: Performed at [facility]  Lab, [address] [place], [place] [address]  Glucose, capillary     Status: Abnormal   [address] Time: 07/22/22 11:30 AM  Result Value Ref Range   Glucose-Capillary 284 (H) 70 - 99 mg/dL    Comment: Glucose reference range applies only to samples taken after fasting for at least 8 hours.   Comment 1 Notify RN    Comment 2 Document in Chart   Glucose, capillary     Status: Abnormal   Collection Time: 07/22/22  4:08 PM  Result Value Ref Range   Glucose-Capillary 154 (H) 70 - 99 mg/dL    Comment: Glucose reference range applies only to samples taken after fasting for at least 8 hours.   Comment 1 Notify RN    Comment 2 Document in Chart   Glucose, capillary     Status: Abnormal   Collection Time: 07/22/22  9:18 PM  Result Value Ref Range   Glucose-Capillary 185 (H) 70 - 99 mg/dL    Comment: Glucose reference range applies only to samples taken after fasting for at least 8 hours.   Comment 1 Notify RN    Comment 2 Document in Chart   Basic metabolic panel     Status: Abnormal   Collection Time: 07/23/22  6:26 AM  Result Value Ref Range   Sodium 139 135 - 145 mmol/L   Potassium  4.2 3.5 - 5.1 mmol/L   Chloride 108 98 - 111 mmol/L   CO2 23 22 - 32 mmol/L   Glucose, Bld 175 (H) 70 - 99 mg/dL    Comment: Glucose reference range applies only to samples taken after fasting for at least 8 hours.   BUN 29 (H) 8 - 23 mg/dL   Creatinine, Ser 5.62 (H) 0.61 - 1.24 mg/dL   Calcium 9.0 8.9 - 13.0 mg/dL   GFR, Estimated 47 (L) >60 mL/min    Comment: (NOTE) Calculated using the CKD-EPI Creatinine Equation (2021)    Anion gap 8 5 - 15    Comment: Performed at [facility], [address], [place], [place] [address]  CBC     Status: Abnormal   Collection Time: 07/23/22  6:26 AM  Result Value Ref Range   WBC 7.3 4.0 - 10.5 K/uL   RBC 4.38 4.22 - 5.81 MIL/uL   Hemoglobin 11.9 (L) 13.0 - 17.0 g/dL   HCT 46.9 (L) 62.9 - 52.8 %   MCV 81.3 80.0 - 100.0 fL   MCH 27.2 26.0 - 34.0 pg   MCHC 33.4 30.0 - 36.0 g/dL   RDW 41.3 24.4 - 01.0 %   Platelets 370 150 - 400 K/uL   nRBC 0.0 0.0 - 0.2 %    Comment: Performed at [facility], [address], [place], [place] [address]  Glucose, capillary     Status: Abnormal   Collection Time: 07/23/22  6:32 AM  Result Value Ref Range   Glucose-Capillary 153 (H) 70 - 99 mg/dL    Comment: Glucose reference range applies only to samples taken after fasting for at least 8 hours.   Comment 1 Notify RN    Comment 2 Document in Chart   Glucose, capillary     Status: Abnormal   Collection Time: 07/23/22 11:08 AM  Result Value Ref Range   Glucose-Capillary 256 (H) 70 - 99 mg/dL    Comment: Glucose reference range applies only to samples taken after fasting for at least 8 hours.  Glucose, capillary  Status: Abnormal   Collection Time: 07/23/22  4:33 PM  Result Value Ref Range   Glucose-Capillary 167 (H) 70 - 99 mg/dL    Comment: Glucose reference range applies only to samples taken after fasting for at least 8 hours.  Glucose, capillary     Status: Abnormal   Collection Time: 07/23/22  9:11 PM  Result Value Ref Range    Glucose-Capillary 146 (H) 70 - 99 mg/dL    Comment: Glucose reference range applies only to samples taken after fasting for at least 8 hours.   Comment 1 Notify RN   Glucose, capillary     Status: Abnormal   Collection Time: 07/24/22  1:29 AM  Result Value Ref Range   Glucose-Capillary 146 (H) 70 - 99 mg/dL    Comment: Glucose reference range applies only to samples taken after fasting for at least 8 hours.   No results found.    Blood pressure (!) 144/65, pulse 63, temperature 98.8 F (37.1 C), temperature source Oral, resp. rate 16, height 5\' 9"  (1.753 m), weight 81.3 kg, SpO2 98 %.  Medical Problem List and Plan: 1. 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  Antithrombotics: -DVT/anticoagulation:  Pharmaceutical: Heparin  -antiplatelet therapy: Aspirin 81 mg daily and Brilinta 90 mg twice daily. 3. Pain Management: Tylenol as needed 4. Mood/Behavior/Sleep: Aricept 10 mg nightly, Namenda 10 mg twice daily.  -antipsychotic agents: N/A 5. Neuropsych/cognition: This patient is not capable of making decisions on his own behalf. 6. Skin/Wound Care: Routine skin checks 7. Fluids/Electrolytes/Nutrition: Routine in and outs with follow-up chemistries 8.  Hypertension.  Norvasc 10 mg daily, Coreg 3.125 mg twice daily, lisinopril 40 mg daily.  Monitor with increased mobility 9.  Diabetes mellitus.  Hemoglobin A1c 6.5.  Glucophage 500 mg twice daily, Glucotrol 5 mg daily. 10.  Hyperlipidemia.  Lipitor    07/20/2022 Mitchell Dickerson, PA-C 07/24/2022

## 2022-07-24 NOTE — Discharge Summary (Signed)
[facility]  Physician Discharge Summary   Patient ID: Mitchell Washington MRN: [id number] DOB/AGE: Oct 12, 1950 71 y.o.  Admit date: 07/16/2022 Discharge date:   07/24/2022   PCP: Derinda Late, MD  DISCHARGE DIAGNOSES:  Acute CVA   B12 deficiency   Benign essential hypertension   Cerebrovascular disease   Pure hypercholesterolemia   Type 2 diabetes mellitus with peripheral neuropathy (HCC)   Basilar artery stenosis with infarction ([place])   PATIENT BEING DISCHARGED TO [facility]   CODE STATUS: Full code  DISCHARGE CONDITION: fair  Diet recommendation: Heart healthy  INITIAL HISTORY: Mitchell Washington is a 71 y.o. male with medical history significant of  CVA, DmII, Gout, HLD , HTN ,CKD2, dementia, who presents to [facility] on 07/15/22 with complaint of intermittent unsteady gait and word finding difficulties.  Initial imaging concerning for occlusion, subsequently transferred to [facility] proper for interventional radiology/vascular surgery evaluation.     HOSPITAL COURSE:   Acute CVA -History of previous CVAs w/o residual defictis -CTA with findings of PCA occlusion /vertebral a occulusion mild to mod  -Stent 07/18/22 - CT head negative ,-MRI brain 11/28 remarkable for small CVA - Repeat MRI 11/30 shows multiple new acute infarcts in bilateral cerebellar hemispheres. Patient on aspirin and Brilinta.   Non insulin-dependent diabetes type 2, well-controlled -A1c 6.5 -Continue diabetic diet and previous dose of metformin/glipizide   Gout Patient did experience an acute flare in the left knee.  X-ray did not show any effusion.  5-day course of prednisone.  Uric acid level is 9.1.  Patient not noted to be on allopurinol.  Can be considered once his acute flare has subsided.   Hyperlipidemia -continue statin    Essential hypertension CKD2   Dementia, unspecified  Patient is stable.  Okay for discharge to CIR today.   PERTINENT  LABS:  The results of significant diagnostics from this hospitalization (including imaging, microbiology, ancillary and laboratory) are listed below for reference.    Microbiology: Recent Results (from the past 240 hour(s))  MRSA Next Gen by PCR, Nasal     Status: None   Collection Time: 07/18/22  6:32 PM   Specimen: Nasal Mucosa; Nasal Swab  Result Value Ref Range Status   MRSA by PCR Next Gen NOT DETECTED NOT DETECTED Final    Comment: (NOTE) The GeneXpert MRSA Assay (FDA approved for NASAL specimens only), is one component of a comprehensive MRSA colonization surveillance program. It is not intended to diagnose MRSA infection nor to guide or monitor treatment for MRSA infections. Test performance is not FDA approved in patients less than 27 years old. Performed at [facility], [place] [address], [place], [place] [address]      Labs:   Basic Metabolic Panel: Recent Labs  Lab 07/19/22 0421 07/20/22 0414 07/21/22 0235 07/22/22 0648 07/23/22 0626  NA 140 141 139 137 139  K 4.0 3.6 3.7 3.9 4.2  CL 111 109 108 108 108  CO2 25 22 23 22 23   GLUCOSE 154* 130* 168* 172* 175*  BUN 14 15 15  29* 29*  CREATININE 1.49* 1.42* 1.38* 1.58* 1.56*  CALCIUM 8.5* 8.9 8.9 8.7* 9.0    CBC: Recent Labs  Lab 07/19/22 0421 07/20/22 0414 07/21/22 0235 07/22/22 0648 07/23/22 0626  WBC 10.5 8.1 9.1 8.0 7.3  HGB 11.8* 11.4* 12.5* 11.9* 11.9*  HCT 34.8* 34.8* 37.7* 35.0* 35.6*  MCV 80.6 81.9 81.1 80.3 81.3  PLT 276 297 318 327 370    CBG:  Recent Labs  Lab 07/23/22 0632 07/23/22 1108 07/23/22 1633 07/23/22 2111 07/24/22 0129  GLUCAP 153* 256* 167* 146* 146*     IMAGING STUDIES DG Knee Left Port  Result Date: 07/24/2022 CLINICAL DATA:  Pain.  No injury. EXAM: PORTABLE LEFT KNEE - 1-2 VIEW COMPARISON:  None Available. FINDINGS: No fracture. No dislocation. There are bidirectional enthesopathic changes of the patella. Vascular calcifications are present. No radiopaque  foreign body. No effusion. IMPRESSION: Patellofemoral compartment predominant degenerative change. No fracture or dislocation. Electronically Signed   By: Marin Roberts M.D.   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 Previous history of bilateral occipital infarcts. EXAM: PTA INTRACRANIAL COMPARISON:  Recent CT angiogram of the head and neck, and MRI of the brain. MEDICATIONS: Heparin 4,000 units IV. Ancef 2 g IV antibiotic was administered within 1 hour of the procedure. ANESTHESIA/SEDATION: General anesthesia. CONTRAST:  Omnipaque 300 approximately 80 cc. FLUOROSCOPY TIME:  FLUOROSCOPY TIME Fluoroscopy Time: 29 minutes 24 seconds (2813 mGy). COMPLICATIONS: None immediate. TECHNIQUE: Informed written consent was obtained from the patient after a thorough discussion of the procedural risks, benefits and alternatives. All questions were addressed. Maximal Sterile Barrier Technique was utilized including caps, mask, sterile gowns, sterile gloves, sterile drape, hand hygiene and skin antiseptic. A timeout was performed prior to the initiation of the procedure. The right groin was prepped and draped in the usual sterile fashion. Thereafter using modified Seldinger technique, transfemoral access into the right common femoral artery was obtained without difficulty. Over a 0.035 inch guidewire, an 8 French 15 cm Pinnacle sheath was inserted. Through this, and also over 0.035 inch guidewire, a 5 [place] JB 1 catheter was advanced to the aortic arch region and selectively positioned in the distal left subclavian artery. FINDINGS: The left vertebral artery origin demonstrates approximately 30% stenosis at its origin. More distally, the vessel is seen to opacify to the cranial skull base. The left vertebrobasilar junction proximal  just distal to the left posterior-inferior cerebellar artery demonstrates minimal stenosis. Severe focal stenosis again demonstrated of the distal left vertebrobasilar junction at its confluence with the proximal basilar artery. Distal to this the proximal basilar artery appears patent to the level of the anterior-inferior cerebellar arteries. Distal to this there is a severe pre occlusive stenosis of the mid basilar artery. Distal to this the basilar artery caliber improves to near normal. Distal focal narrowing is seen of the distal basilar artery at the level of the superior cerebellar arteries. There is incomplete irregular opacification of the proximal P1 segments secondary to severe intracranial arteriosclerosis. Stenosis is noted focally at the origin of the left superior cerebellar artery, and the left anterior-inferior cerebellar artery. Partial retrograde opacification of the right vertebrobasilar junction is noted to the level of the right posterior-inferior cerebellar artery. High-grade stenosis is again demonstrated of the distal right vertebrobasilar junction proximal to the basilar artery. ENDOVASCULAR REVASCULARIZATION OF THE MID BASILAR ARTERY STENOSIS AND OF THE LEFT VERTEBROBASILAR JUNCTION AT THE LEVEL OF THE PROXIMAL BASILAR ARTERY WITH BALLOON ANGIOPLASTY. The JB 1 diagnostic catheter in the distal left subclavian artery was then exchanged over a 0.035 inch 300 cm Rosen exchange guidewire for a 95 cm 071 Benchmark guide catheter with a 125 cm Bernstein catheter. The guidewire was removed. Good aspiration was obtained at the hub of the [place] support catheter. An  035 inch Roadrunner guidewire was then advanced through the support catheter and advanced distally into the vertebral artery followed by the support catheter followed by the Benchmark guide catheter. The guidewire, and the support catheter were removed. Good aspiration was obtained from the hub of the Benchmark guide catheter. A  control arteriogram performed through this demonstrated no evidence of spasms, dissections or of intraluminal filling defects. Again demonstrated was the high-grade stenosis of the basilar artery in the left vertebrobasilar junction. Over an 014 inch soft tip micro guidewire, an 044 Phenom 120 cm guide catheter was then advanced distal to the Benchmark guide catheter and positioned in the V4 segment of the left vertebral artery. The guidewire was removed. Gentle control arteriogram performed through the Phenom catheter demonstrated no changes in the intracranial circulation. At this time, a 1.5 mm x 15 mm Gateway angioplasty balloon catheter which had been prepped with 75% contrast and 25% heparinized saline infusion was then advanced over an 014 inch Aristotle soft tip micro guidewire to the distal end of the Phenom catheter. The guidewire was then gently manipulated without any difficulty through the left vertebrobasilar stenosis and the tight mid basilar artery stenosis of the distal basilar artery. Balloon was then advanced to the position initially across the mid basilar artery stenosis and then the proximal left vertebrobasilar artery junction stenosis. At each site, balloon was inflated using micro inflation syringe device via micro tubing in a progressively slow fashion to approximately 5 atmospheres on both sides. This was maintained for approximately 45 seconds. The balloon was then deflated and retrieved proximally with the wire being maintained distally. A control arteriogram performed through the Phenom catheter demonstrated significantly improved caliber and flow through the both sides of severe stenosis. There was now improved flow in the superior cerebellar arteries with no significant change in the proximal P1 segments. Given the appreciable significant waist at the sites of the 2 angioplasties, a Gateway 2 mm x 15 mm angioplasty microcatheter again which had been prepped as mentioned above was  advanced over an 014 inch micro guidewire with a moderate J configuration to the distal end of the Phenom 44 catheter which was now positioned in the mid left vertebrobasilar junction. The 2 angioplastied segments were then crossed without any difficulty into the distal basilar artery. Control inflation was then performed using micro inflation syringe device via micro tubing with the balloon being inflated to 5 atmospheres just at 2 mm. Both sides of the balloon was then obtained for approximately 45 seconds. Following deflation and removal of the balloon, control arteriogram was performed significantly improved caliber and flow through the angioplastied segment. There was now patency of approximately 55% to 60% on both the AP and lateral projections at both sites. Follow-up arteriogram performed at 15 and 30 minutes post angioplasty at both sites continued to demonstrate excellent flow through both the sites without change in the caliber without any rebound phenomenon evident. The angioplasty Gateway balloon catheter was then gently retrieved and removed with the wire. A control arteriogram performed through the Benchmark guide catheter in the left vertebral artery continued to demonstrate the patency of the extracranial and intracranial left vertebral artery including the previously noted branches. The angioplastied segments at the mid basilar artery, and the distal left vertebrobasilar to be maintained. The Benchmark guide catheter was retrieved and removed. An 8 French Angio-Seal closure device was then deployed for hemostasis at the right groin puncture site. Distal pulses continued to be Dopplerable in both feet unchanged from  prior to procedure. A flat panel CT of the brain demonstrated no evidence of intracranial hemorrhage, or hydrocephalus. Patient's general anesthesia was then reversed, and patient extubated. Upon recovery, the patient responded to name turning, and also frequent movement of his right arm  and leg freely. Pupils were 2-3 mm regular round and unchanged. Over the next few minutes, the patient's sodium continued to improve where the patient was more responsive and appropriate. Continued to have difficulty with dysarthria. Now significantly more movement was noted of the left arm and leg. Patient was then transferred to the PACU and then subsequently the neuro ICU. The next morning, the patient had improved significantly where he was more promptly and readily responsive. His speech was returning to where he was able to make phrases appropriately. His left facial droop appeared less prominent. No evidence of nystagmus diplopia was evident on examination. The patient was able to hold his left upper extremity against gravity to a count of 10. He was able to bend his left leg at the knee and able to keep his left leg off the bed. The right groin appeared soft without any tenderness, or of palpable hematoma. 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 PLAN: Patient to be seen in the clinic 1 month post discharge with MRI and MRA of the brain. Electronically Signed   By: Luanne Bras M.D.   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 Previous history of bilateral occipital infarcts. EXAM: PTA INTRACRANIAL COMPARISON:  Recent CT angiogram of the head and neck, and MRI of the brain. MEDICATIONS: Heparin 4,000 units IV. Ancef 2 g IV antibiotic was administered within 1 hour of the procedure. ANESTHESIA/SEDATION: General anesthesia. CONTRAST:  Omnipaque 300 approximately 80 cc. FLUOROSCOPY TIME:  FLUOROSCOPY TIME Fluoroscopy Time:  29 minutes 24 seconds (2813 mGy). COMPLICATIONS: None immediate. TECHNIQUE: Informed written consent was obtained from the patient after a thorough discussion of the procedural risks, benefits and alternatives. All questions were addressed. Maximal Sterile Barrier Technique was utilized including caps, mask, sterile gowns, sterile gloves, sterile drape, hand hygiene and skin antiseptic. A timeout was performed prior to the initiation of the procedure. The right groin was prepped and draped in the usual sterile fashion. Thereafter using modified Seldinger technique, transfemoral access into the right common femoral artery was obtained without difficulty. Over a 0.035 inch guidewire, an 8 French 15 cm Pinnacle sheath was inserted. Through this, and also over 0.035 inch guidewire, a 5 [place] JB 1 catheter was advanced to the aortic arch region and selectively positioned in the distal left subclavian artery. FINDINGS: The left vertebral artery origin demonstrates approximately 30% stenosis at its origin. More distally, the vessel is seen to opacify to the cranial skull base. The left vertebrobasilar junction proximal just distal to the left posterior-inferior cerebellar artery demonstrates minimal stenosis. Severe focal stenosis again demonstrated of the distal left vertebrobasilar junction at its confluence with the proximal basilar artery. Distal to this the proximal basilar artery appears patent to the level of the anterior-inferior cerebellar arteries. Distal to this there is a severe pre occlusive stenosis of the mid basilar artery. Distal to this the basilar artery caliber improves to near normal. Distal  focal narrowing is seen of the distal basilar artery at the level of the superior cerebellar arteries. There is incomplete irregular opacification of the proximal P1 segments secondary to severe intracranial arteriosclerosis. Stenosis is noted focally at the origin of the left superior cerebellar artery, and the  left anterior-inferior cerebellar artery. Partial retrograde opacification of the right vertebrobasilar junction is noted to the level of the right posterior-inferior cerebellar artery. High-grade stenosis is again demonstrated of the distal right vertebrobasilar junction proximal to the basilar artery. ENDOVASCULAR REVASCULARIZATION OF THE MID BASILAR ARTERY STENOSIS AND OF THE LEFT VERTEBROBASILAR JUNCTION AT THE LEVEL OF THE PROXIMAL BASILAR ARTERY WITH BALLOON ANGIOPLASTY. The JB 1 diagnostic catheter in the distal left subclavian artery was then exchanged over a 0.035 inch 300 cm Rosen exchange guidewire for a 95 cm 071 Benchmark guide catheter with a 125 cm Bernstein catheter. The guidewire was removed. Good aspiration was obtained at the hub of the [place] support catheter. An 035 inch Roadrunner guidewire was then advanced through the support catheter and advanced distally into the vertebral artery followed by the support catheter followed by the Benchmark guide catheter. The guidewire, and the support catheter were removed. Good aspiration was obtained from the hub of the Benchmark guide catheter. A control arteriogram performed through this demonstrated no evidence of spasms, dissections or of intraluminal filling defects. Again demonstrated was the high-grade stenosis of the basilar artery in the left vertebrobasilar junction. Over an 014 inch soft tip micro guidewire, an 044 Phenom 120 cm guide catheter was then advanced distal to the Benchmark guide catheter and positioned in the V4 segment of the left vertebral artery. The guidewire was removed. Gentle control arteriogram performed through the Phenom catheter demonstrated no changes in the intracranial circulation. At this time, a 1.5 mm x 15 mm Gateway angioplasty balloon catheter which had been prepped with 75% contrast and 25% heparinized saline infusion was then advanced over an 014 inch Aristotle soft tip micro guidewire to the distal end of  the Phenom catheter. The guidewire was then gently manipulated without any difficulty through the left vertebrobasilar stenosis and the tight mid basilar artery stenosis of the distal basilar artery. Balloon was then advanced to the position initially across the mid basilar artery stenosis and then the proximal left vertebrobasilar artery junction stenosis. At each site, balloon was inflated using micro inflation syringe device via micro tubing in a progressively slow fashion to approximately 5 atmospheres on both sides. This was maintained for approximately 45 seconds. The balloon was then deflated and retrieved proximally with the wire being maintained distally. A control arteriogram performed through the Phenom catheter demonstrated significantly improved caliber and flow through the both sides of severe stenosis. There was now improved flow in the superior cerebellar arteries with no significant change in the proximal P1 segments. Given the appreciable significant waist at the sites of the 2 angioplasties, a Gateway 2 mm x 15 mm angioplasty microcatheter again which had been prepped as mentioned above was advanced over an 014 inch micro guidewire with a moderate J configuration to the distal end of the Phenom 44 catheter which was now positioned in the mid left vertebrobasilar junction. The 2 angioplastied segments were then crossed without any difficulty into the distal basilar artery. Control inflation was then performed using micro inflation syringe device via micro tubing with the balloon being inflated to 5 atmospheres just at 2 mm. Both sides of the balloon was then obtained for approximately 45 seconds. Following deflation and removal  of the balloon, control arteriogram was performed significantly improved caliber and flow through the angioplastied segment. There was now patency of approximately 55% to 60% on both the AP and lateral projections at both sites. Follow-up arteriogram performed at 15 and 30  minutes post angioplasty at both sites continued to demonstrate excellent flow through both the sites without change in the caliber without any rebound phenomenon evident. The angioplasty Gateway balloon catheter was then gently retrieved and removed with the wire. A control arteriogram performed through the Benchmark guide catheter in the left vertebral artery continued to demonstrate the patency of the extracranial and intracranial left vertebral artery including the previously noted branches. The angioplastied segments at the mid basilar artery, and the distal left vertebrobasilar to be maintained. The Benchmark guide catheter was retrieved and removed. An 8 French Angio-Seal closure device was then deployed for hemostasis at the right groin puncture site. Distal pulses continued to be Dopplerable in both feet unchanged from prior to procedure. A flat panel CT of the brain demonstrated no evidence of intracranial hemorrhage, or hydrocephalus. Patient's general anesthesia was then reversed, and patient extubated. Upon recovery, the patient responded to name turning, and also frequent movement of his right arm and leg freely. Pupils were 2-3 mm regular round and unchanged. Over the next few minutes, the patient's sodium continued to improve where the patient was more responsive and appropriate. Continued to have difficulty with dysarthria. Now significantly more movement was noted of the left arm and leg. Patient was then transferred to the PACU and then subsequently the neuro ICU. The next morning, the patient had improved significantly where he was more promptly and readily responsive. His speech was returning to where he was able to make phrases appropriately. His left facial droop appeared less prominent. No evidence of nystagmus diplopia was evident on examination. The patient was able to hold his left upper extremity against gravity to a count of 10. He was able to bend his left leg at the knee and able to  keep his left leg off the bed. The right groin appeared soft without any tenderness, or of palpable hematoma. 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 PLAN: Patient to be seen in the clinic 1 month post discharge with MRI and MRA of the brain. Electronically Signed   By: Luanne Bras M.D.   On: 07/22/2022 11:36   MR ANGIO HEAD WO CONTRAST  Result Date: 07/19/2022 CLINICAL DATA:  Stroke follow-up EXAM: MRI HEAD WITHOUT CONTRAST MRA HEAD WITHOUT CONTRAST TECHNIQUE: Multiplanar, multi-echo pulse sequences of the brain and surrounding structures were acquired without intravenous contrast. Angiographic images of the Circle of Willis were acquired using MRA technique without intravenous contrast. 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 These are all small. 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 There is multifocal hyperintense T2-weighted signal within the white matter. Generalized volume loss. 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 The midline structures are normal. Vascular: Major flow voids are preserved. Skull and upper cervical spine: Normal calvarium and skull base. Visualized upper cervical spine and soft tissues are normal. Sinuses/Orbits:No paranasal sinus fluid levels or advanced mucosal thickening. No mastoid or middle ear effusion. Normal orbits. MRA HEAD  FINDINGS POSTERIOR CIRCULATION: --Vertebral arteries: Normal --Inferior cerebellar arteries: Normal. --Basilar artery: Normal. --Superior cerebellar arteries: Normal. --Posterior cerebral arteries: Unchanged bilateral P1 occlusion. ANTERIOR  CIRCULATION: --Intracranial internal carotid arteries: Normal. --Anterior cerebral arteries (ACA): Normal. Hypoplastic left A1 segment, normal variant --Middle cerebral arteries (MCA): Mild atherosclerotic irregularity of both M1 segments. ANATOMIC VARIANTS: None IMPRESSION: 1. Multiple new, small acute infarcts within both cerebellar hemispheres, right greater than left. 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 2. Unchanged bilateral P1 occlusions. No new intracranial occlusion. Electronically Signed   By: Ulyses Jarred M.D.   On: 07/19/2022 01:43   MR BRAIN WO CONTRAST  Result Date: 07/19/2022 CLINICAL DATA:  Stroke follow-up EXAM: MRI HEAD WITHOUT CONTRAST MRA HEAD WITHOUT CONTRAST TECHNIQUE: Multiplanar, multi-echo pulse sequences of the brain and surrounding structures were acquired without intravenous contrast. Angiographic images of the Circle of Willis were acquired using MRA technique without intravenous contrast. 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 These are all small. 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 There is multifocal hyperintense T2-weighted signal within the white matter. Generalized volume loss. 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 The midline structures are normal. Vascular: Major flow voids are preserved. Skull and upper cervical spine: Normal calvarium and skull base. Visualized upper cervical spine and soft tissues are normal. Sinuses/Orbits:No paranasal sinus fluid levels or advanced mucosal thickening. No mastoid or middle ear effusion. Normal orbits. MRA HEAD FINDINGS POSTERIOR CIRCULATION: --Vertebral arteries: Normal --Inferior cerebellar arteries: Normal. --Basilar artery: Normal. --Superior cerebellar arteries: Normal. --Posterior cerebral arteries:  Unchanged bilateral P1 occlusion. ANTERIOR CIRCULATION: --Intracranial internal carotid arteries: Normal. --Anterior cerebral arteries (ACA): Normal. Hypoplastic left A1 segment, normal variant --Middle cerebral arteries (MCA): Mild atherosclerotic irregularity of both M1 segments. ANATOMIC VARIANTS: None IMPRESSION: 1. Multiple new, small acute infarcts within both cerebellar hemispheres, right greater than left. 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 2. Unchanged bilateral P1 occlusions. No new intracranial occlusion. Electronically Signed   By: Ulyses Jarred M.D.   On: 07/19/2022 01:43   IR ANGIO INTRA EXTRACRAN SEL COM CAROTID INNOMINATE BILAT MOD SED  Result Date: 07/17/2022 CLINICAL DATA:  Severe vertebrobasilar ischemic symptoms of dizziness, gait imbalance, and lethargy. 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 EXAM: BILATERAL COMMON CAROTID AND INNOMINATE ANGIOGRAPHY COMPARISON:  CT angiogram of the head and neck July 15, 2022. MEDICATIONS: Heparin 1000 units IV. No antibiotic was administered within 1 hour of the procedure. ANESTHESIA/SEDATION: Versed 1 mg IV; Fentanyl 25 mcg IV Moderate Sedation Time:  27 minutes The patient was continuously monitored during the procedure by the interventional radiology nurse under my direct supervision. CONTRAST:  Omnipaque 300 approximately 65 mL. FLUOROSCOPY TIME:  Fluoroscopy Time: 8 minutes 0 seconds (1080 mGy). COMPLICATIONS: None immediate. TECHNIQUE: Informed written consent was obtained from the patient after a thorough discussion of the procedural risks, benefits and alternatives. All questions were addressed. Maximal Sterile Barrier Technique was utilized including caps, mask, sterile gowns, sterile gloves, sterile drape, hand hygiene and skin antiseptic. A timeout was performed prior to the initiation of the procedure. The right groin was prepped and draped in the usual sterile  fashion. Thereafter using modified Seldinger technique, transfemoral access into the right common femoral artery was obtained without difficulty. Over an 0.035 inch guidewire, a 5 [place] Pinnacle sheath  was inserted. Through this, and also over an 0.035 inch guidewire, a 5 [place] JB 1 catheter was advanced to the aortic arch region and selectively positioned in the right common carotid artery, the right vertebral artery, the left common carotid artery and the left vertebral artery. FINDINGS: The innominate arteriogram demonstrates the right subclavian artery proximally in the right common carotid artery to be patent proximally. The non dominant right vertebral artery has a 30% stenosis at its origin. More distally, the vessel is seen to ascend to the cranial skull base. Patency is seen of the right vertebrobasilar junction with a mild-to-moderate tapered stenosis of the distal right vertebrobasilar junction. Severe decrease in caliber is seen of the proximal basilar artery, and in the mid basilar artery. Contrast is noted in the distal basilar artery, the superior cerebellar artery and transiently the anterior cerebral arteries proximally. The right common carotid arteriogram demonstrates the right external carotid artery and its major branches to be widely patent. The right internal carotid artery stenosis proximally with a small ulcerated plaque along the medial wall of the bulb. More distally, the right internal carotid artery opacifies normally to the cranial skull base. The petrous and the cavernous segments are widely patent. There is a 50-60% stenosis of the right internal carotid artery supraclinoid segment. The right middle cerebral artery demonstrates a 50-60% stenosis in the mid M1 segment with flow noted into the distal distribution. The right anterior cerebral artery opacifies into the capillary and venous phases with prompt cross-filling via the anterior communicating artery of the left anterior  cerebral artery A2 segment and distally. The left common carotid arteriogram demonstrates the left external carotid artery and its major branches to be widely patent. The left internal carotid artery at the bulb has a smooth shallow plaque along the posterior wall of the bulb with no significant stenosis by the NASCET criteria. No acute ulcerations are identifiable. More distally, the left internal carotid artery opacifies to the cranial skull base. The petrous and the proximal cavernous segments are widely patent. There is a mild stenosis of the distal cavernous segment with the supraclinoid segment demonstrating near normal patency. The left middle cerebral artery has a mild stenosis in its proximal M1 segment. More distally, the trifurcation branches opacify normally. The left anterior cerebral artery proximal A1 segment demonstrates moderate to severe stenosis with flow noted more distally into the A2 segment. The dominant left vertebral artery has approximately 50% stenosis at its origin. More distally, the vessel is seen to opacify to the cranial skull base. Wide patency is seen of the left vertebrobasilar junction at the level of the left posterior-inferior cerebellar artery. Severe stenosis is seen of the distal left vertebrobasilar junction extending into the proximal basilar artery. Patency is seen of the superior cerebellar arteries and the anterior-inferior cerebellar arteries. Retrograde opacification is seen of the right vertebrobasilar junction with focal significant stenosis just proximal to the basilar artery. 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 Approximately 50% stenosis of the dominant left vertebral artery at its origin. Approximately 30% stenosis of the non dominant right vertebral artery at its origin, with a moderate to severe stenosis of the distal right vertebrobasilar  junction. Approximately 50%-60% stenosis of the right internal carotid artery supraclinoid segment. Approximately 50-60% stenosis of the right middle cerebral artery mid M1 segment. PLAN: Findings discussed with the patient and the spouse regarding management of the symptomatic  high-grade stenosis of the mid basilar artery, and of the proximal basilar artery extending into the distal left vertebrobasilar junction. Electronically Signed   By: Julieanne Cotton M.D.   On: 07/17/2022 08:05   IR ANGIO VERTEBRAL SEL VERTEBRAL UNI L MOD SED  Result Date: 07/17/2022 CLINICAL DATA:  Severe vertebrobasilar ischemic symptoms of dizziness, gait imbalance, and lethargy. 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 EXAM: BILATERAL COMMON CAROTID AND INNOMINATE ANGIOGRAPHY COMPARISON:  CT angiogram of the head and neck July 15, 2022. MEDICATIONS: Heparin 1000 units IV. No antibiotic was administered within 1 hour of the procedure. ANESTHESIA/SEDATION: Versed 1 mg IV; Fentanyl 25 mcg IV Moderate Sedation Time:  27 minutes The patient was continuously monitored during the procedure by the interventional radiology nurse under my direct supervision. CONTRAST:  Omnipaque 300 approximately 65 mL. FLUOROSCOPY TIME:  Fluoroscopy Time: 8 minutes 0 seconds (1080 mGy). COMPLICATIONS: None immediate. TECHNIQUE: Informed written consent was obtained from the patient after a thorough discussion of the procedural risks, benefits and alternatives. All questions were addressed. Maximal Sterile Barrier Technique was utilized including caps, mask, sterile gowns, sterile gloves, sterile drape, hand hygiene and skin antiseptic. A timeout was performed prior to the initiation of the procedure. The right groin was prepped and draped in the usual sterile fashion. Thereafter using modified Seldinger technique, transfemoral access into the right common femoral artery was obtained without  difficulty. Over an 0.035 inch guidewire, a 5 [place] Pinnacle sheath was inserted. Through this, and also over an 0.035 inch guidewire, a 5 [place] JB 1 catheter was advanced to the aortic arch region and selectively positioned in the right common carotid artery, the right vertebral artery, the left common carotid artery and the left vertebral artery. FINDINGS: The innominate arteriogram demonstrates the right subclavian artery proximally in the right common carotid artery to be patent proximally. The non dominant right vertebral artery has a 30% stenosis at its origin. More distally, the vessel is seen to ascend to the cranial skull base. Patency is seen of the right vertebrobasilar junction with a mild-to-moderate tapered stenosis of the distal right vertebrobasilar junction. Severe decrease in caliber is seen of the proximal basilar artery, and in the mid basilar artery. Contrast is noted in the distal basilar artery, the superior cerebellar artery and transiently the anterior cerebral arteries proximally. The right common carotid arteriogram demonstrates the right external carotid artery and its major branches to be widely patent. The right internal carotid artery stenosis proximally with a small ulcerated plaque along the medial wall of the bulb. More distally, the right internal carotid artery opacifies normally to the cranial skull base. The petrous and the cavernous segments are widely patent. There is a 50-60% stenosis of the right internal carotid artery supraclinoid segment. The right middle cerebral artery demonstrates a 50-60% stenosis in the mid M1 segment with flow noted into the distal distribution. The right anterior cerebral artery opacifies into the capillary and venous phases with prompt cross-filling via the anterior communicating artery of the left anterior cerebral artery A2 segment and distally. The left common carotid arteriogram demonstrates the left external carotid artery and its major  branches to be widely patent. The left internal carotid artery at the bulb has a smooth shallow plaque along the posterior wall of the bulb with no significant stenosis by the NASCET criteria. No acute ulcerations are identifiable. More distally, the left internal carotid artery opacifies to the cranial skull base. The  petrous and the proximal cavernous segments are widely patent. There is a mild stenosis of the distal cavernous segment with the supraclinoid segment demonstrating near normal patency. The left middle cerebral artery has a mild stenosis in its proximal M1 segment. More distally, the trifurcation branches opacify normally. The left anterior cerebral artery proximal A1 segment demonstrates moderate to severe stenosis with flow noted more distally into the A2 segment. The dominant left vertebral artery has approximately 50% stenosis at its origin. More distally, the vessel is seen to opacify to the cranial skull base. Wide patency is seen of the left vertebrobasilar junction at the level of the left posterior-inferior cerebellar artery. Severe stenosis is seen of the distal left vertebrobasilar junction extending into the proximal basilar artery. Patency is seen of the superior cerebellar arteries and the anterior-inferior cerebellar arteries. Retrograde opacification is seen of the right vertebrobasilar junction with focal significant stenosis just proximal to the basilar artery. 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 Approximately 50% stenosis of the dominant left vertebral artery at its origin. Approximately 30% stenosis of the non dominant right vertebral artery at its origin, with a moderate to severe stenosis of the distal right vertebrobasilar junction. Approximately 50%-60% stenosis of the right internal carotid artery supraclinoid segment. Approximately 50-60% stenosis of the  right middle cerebral artery mid M1 segment. 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 Electronically Signed   By: Luanne Bras M.D.   On: 07/17/2022 08:05   IR ANGIO VERTEBRAL SEL SUBCLAVIAN INNOMINATE UNI R MOD SED  Result Date: 07/17/2022 CLINICAL DATA:  Severe vertebrobasilar ischemic symptoms of dizziness, gait imbalance, and lethargy. 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 EXAM: BILATERAL COMMON CAROTID AND INNOMINATE ANGIOGRAPHY COMPARISON:  CT angiogram of the head and neck July 15, 2022. MEDICATIONS: Heparin 1000 units IV. No antibiotic was administered within 1 hour of the procedure. ANESTHESIA/SEDATION: Versed 1 mg IV; Fentanyl 25 mcg IV Moderate Sedation Time:  27 minutes The patient was continuously monitored during the procedure by the interventional radiology nurse under my direct supervision. CONTRAST:  Omnipaque 300 approximately 65 mL. FLUOROSCOPY TIME:  Fluoroscopy Time: 8 minutes 0 seconds (1080 mGy). COMPLICATIONS: None immediate. TECHNIQUE: Informed written consent was obtained from the patient after a thorough discussion of the procedural risks, benefits and alternatives. All questions were addressed. Maximal Sterile Barrier Technique was utilized including caps, mask, sterile gowns, sterile gloves, sterile drape, hand hygiene and skin antiseptic. A timeout was performed prior to the initiation of the procedure. The right groin was prepped and draped in the usual sterile fashion. Thereafter using modified Seldinger technique, transfemoral access into the right common femoral artery was obtained without difficulty. Over an 0.035 inch guidewire, a 5 [place] Pinnacle sheath was inserted. Through this, and also over an 0.035 inch  guidewire, a 5 [place] JB 1 catheter was advanced to the aortic arch region and selectively positioned in the right common carotid artery, the right vertebral artery, the left common carotid artery and the left vertebral artery. FINDINGS: The innominate arteriogram demonstrates the right subclavian artery proximally in the right common carotid artery to be patent proximally. The non dominant right vertebral artery has a 30% stenosis at its  origin. More distally, the vessel is seen to ascend to the cranial skull base. Patency is seen of the right vertebrobasilar junction with a mild-to-moderate tapered stenosis of the distal right vertebrobasilar junction. Severe decrease in caliber is seen of the proximal basilar artery, and in the mid basilar artery. Contrast is noted in the distal basilar artery, the superior cerebellar artery and transiently the anterior cerebral arteries proximally. The right common carotid arteriogram demonstrates the right external carotid artery and its major branches to be widely patent. The right internal carotid artery stenosis proximally with a small ulcerated plaque along the medial wall of the bulb. More distally, the right internal carotid artery opacifies normally to the cranial skull base. The petrous and the cavernous segments are widely patent. There is a 50-60% stenosis of the right internal carotid artery supraclinoid segment. The right middle cerebral artery demonstrates a 50-60% stenosis in the mid M1 segment with flow noted into the distal distribution. The right anterior cerebral artery opacifies into the capillary and venous phases with prompt cross-filling via the anterior communicating artery of the left anterior cerebral artery A2 segment and distally. The left common carotid arteriogram demonstrates the left external carotid artery and its major branches to be widely patent. The left internal carotid artery at the bulb has a smooth shallow plaque along the posterior wall  of the bulb with no significant stenosis by the NASCET criteria. No acute ulcerations are identifiable. More distally, the left internal carotid artery opacifies to the cranial skull base. The petrous and the proximal cavernous segments are widely patent. There is a mild stenosis of the distal cavernous segment with the supraclinoid segment demonstrating near normal patency. The left middle cerebral artery has a mild stenosis in its proximal M1 segment. More distally, the trifurcation branches opacify normally. The left anterior cerebral artery proximal A1 segment demonstrates moderate to severe stenosis with flow noted more distally into the A2 segment. The dominant left vertebral artery has approximately 50% stenosis at its origin. More distally, the vessel is seen to opacify to the cranial skull base. Wide patency is seen of the left vertebrobasilar junction at the level of the left posterior-inferior cerebellar artery. Severe stenosis is seen of the distal left vertebrobasilar junction extending into the proximal basilar artery. Patency is seen of the superior cerebellar arteries and the anterior-inferior cerebellar arteries. Retrograde opacification is seen of the right vertebrobasilar junction with focal significant stenosis just proximal to the basilar artery. 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 Approximately 50% stenosis of the dominant left vertebral artery at its origin. Approximately 30% stenosis of the non dominant right vertebral artery at its origin, with a moderate to severe stenosis of the distal right vertebrobasilar junction. Approximately 50%-60% stenosis of the right internal carotid artery supraclinoid segment. Approximately 50-60% stenosis of the right middle cerebral artery mid M1 segment. PLAN: Findings discussed with the patient and the spouse regarding management of  the symptomatic high-grade stenosis of the mid basilar artery, and of the proximal basilar artery extending into the distal left vertebrobasilar junction. Electronically Signed   By: Julieanne Cotton M.D.   On: 07/17/2022 08:05   MR BRAIN WO CONTRAST  Result Date: 07/16/2022 CLINICAL DATA:  Stroke follow-up EXAM: MRI HEAD WITHOUT CONTRAST TECHNIQUE: Multiplanar, multiecho pulse sequences of the brain and surrounding structures were obtained without intravenous contrast. Four sequences were performed COMPARISON:  Head CT 07/15/2022  FINDINGS: There is a small acute infarct of the right paramedian ventral pons. No other area of acute ischemia. There are old bilateral occipital infarcts and an old left lentiform nucleus infarct. There is multifocal periventricular white matter hyperintensity, most often a result of chronic microvascular ischemia. No acute hemorrhage. IMPRESSION: Small acute infarct of the right paramedian ventral pons. Electronically Signed   By: Ulyses Jarred M.D.   On: 07/16/2022 22:06   ECHOCARDIOGRAM COMPLETE  Result Date: 07/16/2022    ECHOCARDIOGRAM REPORT   Patient Name:   OCTAVIO MILTON Date of Exam: 07/16/2022 Medical Rec #:  [id number]        Height:       69.0 in Accession #:    [id number]       Weight:       179.2 lb Date of Birth:  04/22/1951         BSA:          1.972 m Patient Age:    61 years         BP:           151/71 mmHg Patient Gender: M                HR:           50 bpm. Exam Location:  Inpatient Procedure: 2D Echo, Cardiac Doppler and Color Doppler Indications:    TIA G45.9  History:        Patient has no prior history of Echocardiogram examinations.                 Stroke; Risk Factors:Hypertension, Dyslipidemia and Diabetes.  Sonographer:    Bernadene Person RDCS Referring Phys: [id number] [place]  1. Left ventricular ejection fraction, by estimation, is 60 to 65%. The left ventricle has normal function. The left ventricle has no regional wall  motion abnormalities. Left ventricular diastolic parameters are indeterminate.  2. Right ventricular systolic function is normal. The right ventricular size is normal. Tricuspid regurgitation signal is inadequate for assessing PA pressure.  3. Left atrial size was mildly dilated.  4. The mitral valve is normal in structure. Trivial mitral valve regurgitation. No evidence of mitral stenosis.  5. The aortic valve is tricuspid. There is mild thickening of the aortic valve. Aortic valve regurgitation is not visualized. Aortic valve sclerosis is present, with no evidence of aortic valve stenosis.  6. The inferior vena cava is normal in size with greater than 50% respiratory variability, suggesting right atrial pressure of 3 mmHg. FINDINGS  Left Ventricle: Left ventricular ejection fraction, by estimation, is 60 to 65%. The left ventricle has normal function. The left ventricle has no regional wall motion abnormalities. The left ventricular internal cavity size was normal in size. There is  no left ventricular hypertrophy. Left ventricular diastolic parameters are indeterminate. Right Ventricle: The right ventricular size is normal. No increase in right ventricular wall thickness. Right ventricular systolic function is normal. Tricuspid regurgitation signal is inadequate for assessing PA pressure. Left Atrium: Left atrial size was mildly dilated. Right Atrium: Right atrial size was normal in size. Pericardium: There is no evidence of pericardial effusion. Mitral Valve: The mitral valve is normal in structure. Mild mitral annular calcification. Trivial mitral valve regurgitation. No evidence of mitral valve stenosis. Tricuspid Valve: The tricuspid valve is normal in structure. Tricuspid valve regurgitation is not demonstrated. Aortic Valve: The aortic valve is tricuspid. There is mild thickening of the aortic valve. Aortic valve regurgitation  is not visualized. Aortic valve sclerosis is present, with no evidence of aortic  valve stenosis. Pulmonic Valve: The pulmonic valve was grossly normal. Pulmonic valve regurgitation is not visualized. Aorta: The aortic root and ascending aorta are structurally normal, with no evidence of dilitation. Venous: The inferior vena cava is normal in size with greater than 50% respiratory variability, suggesting right atrial pressure of 3 mmHg. IAS/Shunts: No atrial level shunt detected by color flow Doppler.  LEFT VENTRICLE PLAX 2D LVIDd:         4.50 cm     Diastology LVIDs:         2.60 cm     LV e' medial:    5.76 cm/s LV PW:         0.90 cm     LV E/e' medial:  14.7 LV IVS:        0.90 cm     LV e' lateral:   8.43 cm/s LVOT diam:     2.00 cm     LV E/e' lateral: 10.1 LV SV:         69 LV SV Index:   35 LVOT Area:     3.14 cm  LV Volumes (MOD) LV vol d, MOD A2C: 76.2 ml LV vol d, MOD A4C: 93.5 ml LV vol s, MOD A2C: 28.6 ml LV vol s, MOD A4C: 36.4 ml LV SV MOD A2C:     47.6 ml LV SV MOD A4C:     93.5 ml LV SV MOD BP:      52.2 ml RIGHT VENTRICLE RV S prime:     11.60 cm/s TAPSE (M-mode): 1.9 cm LEFT ATRIUM             Index        RIGHT ATRIUM           Index LA diam:        3.90 cm 1.98 cm/m   RA Area:     12.70 cm LA Vol (A2C):   26.7 ml 13.54 ml/m  RA Volume:   24.60 ml  12.47 ml/m LA Vol (A4C):   29.2 ml 14.81 ml/m LA Biplane Vol: 27.9 ml 14.15 ml/m  AORTIC VALVE LVOT Vmax:   88.80 cm/s LVOT Vmean:  55.300 cm/s LVOT VTI:    0.220 m  AORTA Ao Root diam: 2.90 cm Ao Asc diam:  3.30 cm MITRAL VALVE MV Area (PHT): 2.87 cm    SHUNTS MV Decel Time: 264 msec    Systemic VTI:  0.22 m MV E velocity: 84.90 cm/s  Systemic Diam: 2.00 cm MV A velocity: 82.30 cm/s MV E/A ratio:  1.03 Mihai Croitoru MD Electronically signed by Sanda Klein MD Signature Date/Time: 07/16/2022/12:32:57 PM    Final    MR BRAIN WO CONTRAST  Result Date: 07/15/2022 CLINICAL DATA:  Neuro deficit, acute, stroke suspected. Gait disturbance. Headache. Dementia. EXAM: MRI HEAD WITHOUT CONTRAST TECHNIQUE: Multiplanar,  multiecho pulse sequences of the brain and surrounding structures were obtained without intravenous contrast. COMPARISON:  CT studies earlier same day FINDINGS: Brain: Diffusion imaging does not show any acute or subacute infarction. No focal abnormality affects the brainstem. There are numerous old small vessel cerebellar infarctions. Cerebral hemispheres show old infarctions in the PCA territories more extensive on the left than the right with encephalomalacia and gliosis. There is old infarction in the left basal ganglia and external capsule region. Mild chronic small-vessel ischemic changes seen elsewhere within the hemispheric white matter. Hemosiderin deposition is present in  association with the old left basal ganglia/external capsule infarction. No mass lesion, recent hemorrhage, hydrocephalus or extra-axial collection. Vascular: Major vessels at the base of the brain show flow. Skull and upper cervical spine: Negative Sinuses/Orbits: Small amount of mucoid fluid in the left division of the sphenoid sinus. Mild mucosal thickening elsewhere consistent with seasonal rhinitis. Other: None IMPRESSION: 1. No acute or reversible finding. 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 Mild chronic small-vessel ischemic changes elsewhere affecting the brain as outlined above. 2. Mild mucosal thickening and mucoid fluid in the left division of the sphenoid sinus consistent with seasonal rhinitis. Electronically Signed   By: Paulina Fusi M.D.   On: 07/15/2022 12:59   CT ANGIO HEAD NECK W WO CM W PERF (CODE STROKE)  Result Date: 07/15/2022 CLINICAL DATA:  Neuro deficit, acute, stroke suspected. Ataxia, slurred speech, left-sided weakness, and facial droop. EXAM: CT ANGIOGRAPHY HEAD AND NECK CT PERFUSION BRAIN TECHNIQUE: Multidetector CT imaging of the head and neck was performed using the standard protocol during bolus administration of  intravenous contrast. Multiplanar CT image reconstructions and MIPs were obtained to evaluate the vascular anatomy. Carotid stenosis measurements (when applicable) are obtained utilizing NASCET criteria, using the distal internal carotid diameter as the denominator. Multiphase CT imaging of the brain was performed following IV bolus contrast injection. Subsequent parametric perfusion maps were calculated using RAPID software. RADIATION DOSE REDUCTION: This exam was performed according to the departmental dose-optimization program which includes automated exposure control, adjustment of the mA and/or kV according to patient size and/or use of iterative reconstruction technique. CONTRAST:  OMNIPAQUE IOHEXOL 350 MG/ML SOLN COMPARISON:  None Available. FINDINGS: CTA NECK FINDINGS Aortic arch: Normal variant aortic arch branching pattern with common origin of the brachiocephalic and left common carotid arteries. Mild atherosclerosis without a significant stenosis of the arch vessel origins. Right carotid system: Patent with a small amount of calcified and soft plaque at the carotid bifurcation. No evidence of a significant stenosis or dissection. Left carotid system: Patent with a small amount of calcified and soft plaque in the distal common carotid artery. No evidence of a significant stenosis or dissection. Vertebral arteries: Patent with the left being dominant. Plaque at the vertebral origins results in mild stenosis on the right and moderate stenosis on the left. Mild multifocal irregular narrowing of the right V2 segment. Skeleton: Mild cervical spondylosis. Other neck: No evidence of cervical lymphadenopathy or mass. Upper chest: Clear lung apices. Review of the MIP images confirms the above findings CTA HEAD FINDINGS Anterior circulation: The internal carotid arteries are patent from skull base to carotid termini with moderate to severe paraclinoid stenoses bilaterally. ACAs and MCAs are patent with  moderate branch vessel atherosclerotic irregularity as well as severe irregular narrowing of the left A1 segment. No aneurysm is identified. Posterior circulation: The intracranial vertebral arteries are patent to the basilar with severe stenoses bilaterally. The basilar artery is patent with a severe stenosis of its proximal to midportion (immediately distal to the right AICA origin) and a moderate stenosis distally. Both PCAs are occluded at the P1 levels. There may be a small right posterior communicating artery, and there is some reconstitution of distal PCA branches primarily on the right. No aneurysm is identified. Venous sinuses: Patent. Anatomic variants: None. Review of the MIP images confirms the above findings CT Brain Perfusion Findings: ASPECTS: 10 CBF (<30%) Volume: 0 mL Perfusion (Tmax>6.0s) volume: 24 mL, located in both occipital  lobes (PCA territories) Mismatch Volume: 24 mL Infarction Location: No acute core infarct identified by CTP. The study was reviewed by telephone with Dr. Wilford Corner on 07/15/2022 at 11:10 a.m. IMPRESSION: 1. Bilateral proximal PCA occlusion. 2. 24 mL of reported penumbra in the occipital lobes which largely corresponds to chronic infarcts. 3. 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 4. Mild cervical carotid artery atherosclerosis without significant stenosis. 5. Moderate left and mild right vertebral artery origin stenoses. 6.  Aortic Atherosclerosis (ICD10-I70.0). Electronically Signed   By: Sebastian Ache M.D.   On: 07/15/2022 11:48   CT HEAD CODE STROKE WO CONTRAST  Result Date: 07/15/2022 CLINICAL DATA:  Code stroke. Neuro deficit, acute, stroke suspected. Ataxia, slurred speech, left-sided weakness, and facial droop. EXAM: CT HEAD WITHOUT CONTRAST TECHNIQUE: Contiguous axial images were obtained from the base of the skull through the vertex without intravenous contrast.  RADIATION DOSE REDUCTION: This exam was performed according to the departmental dose-optimization program which includes automated exposure control, adjustment of the mA and/or kV according to patient size and/or use of iterative reconstruction technique. COMPARISON:  Head CT 01/21/2021 FINDINGS: Brain: There is no evidence of an acute infarct, intracranial hemorrhage, mass, midline shift, or extra-axial fluid collection. A chronic left basal ganglia infarct, small right and large left chronic PCA infarcts, and small chronic bilateral cerebellar infarcts were present in 2022. There is ex vacuo dilatation of the left lateral ventricle. Hypodensities elsewhere in the cerebral white matter are unchanged and nonspecific but compatible with mild chronic small vessel ischemic disease. Vascular: Calcified atherosclerosis at the skull base. No hyperdense vessel. Skull: No acute fracture or suspicious osseous lesion. Sinuses/Orbits: Mild mucosal thickening in the paranasal sinuses. Clear mastoid air cells. Bilateral cataract extraction. Other: Mild left parietal scalp scarring. ASPECTS ([place] Stroke Program Early CT Score) (right MCA territory) - Ganglionic level infarction (caudate, lentiform nuclei, internal capsule, insula, M1-M3 cortex): 7 - Supraganglionic infarction (M4-M6 cortex): 3 Total score (0-10 with 10 being normal): 10 The study was reviewed by telephone with Dr. Wilford Corner on 07/15/2022 at 11:10 a.m. IMPRESSION: 1. No evidence of acute intracranial abnormality. ASPECTS of 10. 2. 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 Electronically Signed   By: Sebastian Ache M.D.   On: 07/15/2022 11:22   DG Chest Portable 1 View  Result Date: 07/15/2022 CLINICAL DATA:  Weakness EXAM: PORTABLE CHEST 1 VIEW COMPARISON:  Chest radiograph 02/20/2021 FINDINGS: The cardiomediastinal silhouette is normal. There is no focal consolidation or pulmonary edema. There is no pleural effusion  or pneumothorax There is no acute osseous abnormality. IMPRESSION: No radiographic evidence of acute cardiopulmonary process. Electronically Signed   By: Lesia Hausen M.D.   On: 07/15/2022 11:19    DISCHARGE EXAMINATION: Vitals:   07/23/22 1107 07/23/22 1532 07/23/22 1930 07/24/22 0911  BP: (!) 151/60 (!) 149/50 (!) 144/65 (!) 154/62  Pulse: (!) 57 61 63 71  Resp: (!) 26  Temp: 98.3 F (36.8 C) 98.3 F (36.8 C) 98.8 F (37.1 C) 98.3 F (36.8 C)  TempSrc: Oral Oral Oral Oral  SpO2: 99% 96% 98% 98%  Weight:      Height:       General appearance: Awake alert.  In no distress Resp: Clear to auscultation bilaterally.  Normal effort Cardio: S1-S2 is normal regular.  No S3-S4.  No rubs murmurs or bruit GI: Abdomen is soft.  Nontender nondistended.  Bowel sounds  are present normal.  No masses organomegaly   DISPOSITION: CIR   Allergies as of 07/24/2022   No Known Allergies      Medication List     STOP taking these medications    clonazePAM 0.5 MG tablet Commonly known as: KLONOPIN       TAKE these medications    amLODipine 10 MG tablet Commonly known as: NORVASC Take 1 tablet (10 mg total) by mouth daily. What changed:  medication strength how much to take   aspirin EC 81 MG tablet Take 81 mg by mouth daily.   atorvastatin 10 MG tablet Commonly known as: LIPITOR Take 10 mg by mouth daily.   carvedilol 3.125 MG tablet Commonly known as: COREG Take 1 tablet (3.125 mg total) by mouth 2 (two) times daily with a meal.   [employer] VITAMIN B12 1000 MCG tablet Generic drug: cyanocobalamin Take 1,000 mcg by mouth daily.   cyanocobalamin 1000 MCG tablet Take 1 tablet by mouth daily.   donepezil 10 MG tablet Commonly known as: ARICEPT Take 10 mg by mouth at bedtime.   glipiZIDE 5 MG 24 hr tablet Commonly known as: GLUCOTROL XL Take 5 mg by mouth daily with breakfast.   lisinopril 40 MG tablet Commonly known as: ZESTRIL Take 40 mg by mouth daily.    memantine 10 MG tablet Commonly known as: NAMENDA Take 10 mg by mouth 2 (two) times daily.   metFORMIN 500 MG 24 hr tablet Commonly known as: GLUCOPHAGE-XR Take 500 mg by mouth 2 (two) times daily with a meal.   predniSONE 20 MG tablet Commonly known as: DELTASONE Take 1 tablet (20 mg total) by mouth daily with breakfast. Start taking on: July 25, 2022   ticagrelor 90 MG Tabs tablet Commonly known as: BRILINTA Take 1 tablet (90 mg total) by mouth 2 (two) times daily.           TOTAL DISCHARGE TIME: 35 mins  Shakenya Stoneberg [employer] on [email or web address]  07/24/2022, 11:47 AM

## 2022-07-24 NOTE — H&P (Signed)
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  Per chart review patient lives with spouse.  1 level home 3 steps to entry.  Reportedly independent prior to admission.  Presented 07/15/2022 with acute onset of left-sided weakness/dysarthria and gait disturbance.  Initial cranial CT scan negative, MRI of the brain 11/28 remarkable for small CVA right paramedian ventral pons.  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  Patient underwent arteriogram with balloon angioplasty x 2 07/18/2022 per interventional radiology.  Follow-up MRI 07/19/2022 multiple new small acute infarcts within both cerebellar hemispheres, right greater than left. unchanged appearance of right paramedian pontine infarct.  No new intracranial occlusion. Echo with EF 60-65%.  Patient currently maintained on aspirin 81 mg daily and Brilinta 90 mg twice daily.  Subcutaneous heparin added for DVT prophylaxis.  Tolerating a regular consistency diet.  He was started on 5 day course of prednisone for left knee gout flare. Therapy evaluations completed due to patient decreased mobility left-sided weakness was admitted for a comprehensive rehab program.  Review of Systems  Constitutional:  Negative for chills, diaphoresis and fever.  HENT:  Negative for hearing loss.   Eyes:  Negative for blurred vision and double vision.  Respiratory:  Negative for cough, shortness of breath and wheezing.   Cardiovascular:  Negative for chest pain, palpitations and leg swelling.  Gastrointestinal:  Positive for constipation. Negative for heartburn, nausea and vomiting.  Genitourinary:   Negative for dysuria, flank pain and hematuria.  Musculoskeletal:  Positive for joint pain, myalgias and neck pain.  Neurological:  Positive for sensory change, speech change and weakness.  Psychiatric/Behavioral:  Positive for memory loss. Negative for depression.   All other systems reviewed and are negative.  Past Medical History:  Diagnosis Date   CVA (cerebral infarction)    Diabetes mellitus without complication ([place])    Gout    Hyperlipidemia    Hypertension    Memory loss    Stroke [facility])    Wears dentures    partial upper   Past Surgical History:  Procedure Laterality Date   APPENDECTOMY     CATARACT EXTRACTION W/PHACO Left 10/25/2019   Procedure: CATARACT EXTRACTION PHACO AND INTRAOCULAR LENS PLACEMENT (IOC) LEFT DIABETIC INTRAVEITREAL KENALOG INJECTION 1.93  00:22.6;  Surgeon: Eulogio Bear, MD;  Location: [facility];  Service: Ophthalmology;  Laterality: Left;  Diabetic - oral meds   CATARACT EXTRACTION W/PHACO Right 11/15/2019   Procedure: CATARACT EXTRACTION PHACO AND INTRAOCULAR LENS PLACEMENT ([place]) RIGHT DIABETIC;  Surgeon: Eulogio Bear, MD;  Location: [facility];  Service: Ophthalmology;  Laterality: Right;  0.92 0 ;19.7   IR ANGIO INTRA EXTRACRAN SEL COM CAROTID INNOMINATE BILAT MOD SED  07/16/2022   IR ANGIO VERTEBRAL SEL SUBCLAVIAN INNOMINATE UNI R MOD SED  07/16/2022   IR ANGIO VERTEBRAL SEL VERTEBRAL UNI L MOD SED  07/16/2022   IR ANGIO VERTEBRAL SEL VERTEBRAL UNI L MOD SED  07/22/2022   IR CT HEAD LTD  07/18/2022   IR PTA INTRACRANIAL  07/18/2022   RADIOLOGY WITH ANESTHESIA N/A 07/18/2022   Procedure: Cerebral angioplasty with possible stenting;  Surgeon: Luanne Bras, MD;  Location: [facility];  Service: Radiology;  Laterality: N/A;   Family History  Problem Relation Age of Onset   Diabetes Mother    Social History:  reports that he has quit smoking. He has never used smokeless tobacco. He reports that he does not currently use  alcohol. He reports that he does not use drugs. Allergies: No Known Allergies Medications Prior to Admission  Medication Sig Dispense Refill   amLODipine (NORVASC) 10 MG tablet Take 1 tablet (10 mg total) by mouth daily. 30 tablet 1   aspirin EC 81 MG tablet Take 81 mg by mouth daily.     atorvastatin (LIPITOR) 10 MG tablet Take 10 mg by mouth daily.     carvedilol (COREG) 3.125 MG tablet Take 1 tablet (3.125 mg total) by mouth 2 (two) times daily with a meal. 60 tablet 1   [employer] VITAMIN B12 1000 MCG tablet Take 1,000 mcg by mouth daily.     cyanocobalamin 1000 MCG tablet Take 1 tablet by mouth daily.     donepezil (ARICEPT) 10 MG tablet Take 10 mg by mouth at bedtime.     glipiZIDE (GLUCOTROL XL) 5 MG 24 hr tablet Take 5 mg by mouth daily with breakfast.     lisinopril (ZESTRIL) 40 MG tablet Take 40 mg by mouth daily.     memantine (NAMENDA) 10 MG tablet Take 10 mg by mouth 2 (two) times daily.     metFORMIN (GLUCOPHAGE-XR) 500 MG 24 hr tablet Take 500 mg by mouth 2 (two) times daily with a meal.     [START ON 07/25/2022] predniSONE (DELTASONE) 20 MG tablet Take 1 tablet (20 mg total) by mouth daily with breakfast.     ticagrelor (BRILINTA) 90 MG TABS tablet Take 1 tablet (90 mg total) by mouth 2 (two) times daily. 60 tablet 0         Home: Home Living Family/patient expects to be discharged to:: Private residence Living Arrangements: Spouse/significant other Available Help at Discharge: Family, Available 24 hours/day Type of Home: House Home Access: Stairs to enter [employer] of Steps: 3 Entrance Stairs-Rails: Can reach both, Left, Right Home Layout: One level Bathroom Shower/Tub: Health visitor: Standard Bathroom Accessibility: Yes Home Equipment: Information systems manager, Medical laboratory scientific officer - single point  Lives With: Spouse   Functional History: Prior Function Prior Level of Function : Independent/Modified Independent   Functional Status:  Mobility: Bed Mobility Overal  bed mobility: Needs Assistance Bed Mobility: Supine to Sit, Sit to Supine Supine to sit: Mod assist, HOB elevated Sit to supine: Mod assist General bed mobility comments: cues for sequencing, increased time Transfers Overall transfer level: Needs assistance Equipment used: None, Rolling walker (2 wheels) Transfers: Sit to/from Stand, Bed to chair/wheelchair/BSC Sit to Stand: Mod assist, Max assist Bed to/from chair/wheelchair/BSC transfer type:: Step pivot Step pivot transfers: Mod assist, +2 physical assistance  Lateral/Scoot Transfers: Mod assist General transfer comment: able to stand x3 with mod-max assist with RW and cues for hand placement, able to side step to [facility] with multimodal cues Ambulation/Gait General Gait Details: unable due to LLE pain and weakness; side steps to [facility] only   ADL: ADL Overall ADL's : Needs assistance/impaired Eating/Feeding: Supervision/ safety, Sitting Eating/Feeding Details (indicate cue type and reason): RUE only Upper Body Bathing: Minimal assistance, Sitting Upper Body Bathing Details (indicate cue type and reason): simulated Lower Body Bathing: Maximal assistance, Sit to/from stand Lower Body Bathing Details (indicate cue type and reason): simulated Upper Body Dressing : Sitting, Moderate assistance Upper Body Dressing Details (  indicate cue type and reason): simulated Lower Body Dressing: Total assistance, Sit to/from stand Toilet Transfer: Moderate assistance, +2 for physical assistance, +2 for safety/equipment, Stand-pivot Toilet Transfer Details (indicate cue type and reason): simulated to bedside recliner Toileting- Clothing Manipulation and Hygiene: Total assistance, Sitting/lateral lean, Sit to/from stand Toileting - Clothing Manipulation Details (indicate cue type and reason): unable to complete peri-care in standing Functional mobility during ADLs: Moderate assistance, +2 for physical assistance, +2 for safety/equipment, Cueing for safety,  Cueing for sequencing, Rolling walker (2 wheels) General ADL Comments: Session focus on ADLs, transfers, and inceasing overall independence   Cognition: Cognition Overall Cognitive Status: No family/caregiver present to determine baseline cognitive functioning Arousal/Alertness: Awake/alert Orientation Level: Oriented to person, Oriented to place, Disoriented to time, Disoriented to situation Attention: Sustained Sustained Attention: Appears intact Memory:  (intact for biographical information) Cognition Arousal/Alertness: Awake/alert Behavior During Therapy: WFL for tasks assessed/performed Overall Cognitive Status: No family/caregiver present to determine baseline cognitive functioning General Comments: Difficult to understand speech, but pt can repeat slower and louder and usually able to understand; pt initially stating he does not want to try standing, asking "why do i need to do that?" ultimately wanted to work with therapies  Physical Exam: Blood pressure (!) 144/65, pulse 63, temperature 98.8 F (37.1 C), temperature source Oral, resp. rate 16, height 5\' 9"  (1.753 m), weight 81.3 kg, SpO2 98 %.  General: No apparent distress HEENT: Head is normocephalic, atraumatic, PERRLA, EOMI, sclera anicteric, oral mucosa pink and moist Neck: Supple without JVD or lymphadenopathy Heart: Reg rate and rhythm. No murmurs rubs or gallops Chest: CTA bilaterally without wheezes, rales, or rhonchi; no distress Abdomen: Soft, non-tender, non-distended, bowel sounds positive. Extremities: No clubbing, cyanosis, or edema. Pulses are 2+ Psych: Pt's affect is appropriate. Pt is cooperative Skin: Clean and intact without signs of breakdown Neuro:  Patient is alert.  Oriented to name , [facility], not hospital, able to give  month, not year or date, Makes eye contact with examiner.  Speech is a dysarthric.  Follows simple commands. Able to name 2 items. Decreased shoulder shrug on Left,appears to have  some L inattention,  EOMI, tongue midline,Left facial weakness, PERRL, visual field full Strength RUE and RLE 4+/5 LUE 3/5 shoulder abduction, 4-/5 elbow flexion, 3/5 elbow extension, finger flexion 3/5 LLE 1-2/5 hip flexion and knee extension, 3/5 ankle PF and DF Right FTN intact Sensation to LT altered in LUE and LLE Musculoskeletal: Left knee tenderness, no effusion,  no abnormal tone noted  IV RUE x2  Results for orders placed or performed during the hospital encounter of 07/16/22 (from the past 48 hour(s))  Glucose, capillary     Status: Abnormal   Collection Time: 07/22/22  4:08 PM  Result Value Ref Range   Glucose-Capillary 154 (H) 70 - 99 mg/dL    Comment: Glucose reference range applies only to samples taken after fasting for at least 8 hours.   Comment 1 Notify RN    Comment 2 Document in Chart   Glucose, capillary     Status: Abnormal   Collection Time: 07/22/22  9:18 PM  Result Value Ref Range   Glucose-Capillary 185 (H) 70 - 99 mg/dL    Comment: Glucose reference range applies only to samples taken after fasting for at least 8 hours.   Comment 1 Notify RN    Comment 2 Document in Chart   Basic metabolic panel     Status: Abnormal   Collection Time: 07/23/22  6:26 AM  Result Value Ref Range   Sodium 139 135 - 145 mmol/L   Potassium 4.2 3.5 - 5.1 mmol/L   Chloride 108 98 - 111 mmol/L   CO2 23 22 - 32 mmol/L   Glucose, Bld 175 (H) 70 - 99 mg/dL    Comment: Glucose reference range applies only to samples taken after fasting for at least 8 hours.   BUN 29 (H) 8 - 23 mg/dL   Creatinine, Ser 1.56 (H) 0.61 - 1.24 mg/dL   Calcium 9.0 8.9 - 10.3 mg/dL   GFR, Estimated 47 (L) >60 mL/min    Comment: (NOTE) Calculated using the CKD-EPI Creatinine Equation (2021)    Anion gap 8 5 - 15    Comment: Performed at [facility] [address], [place], [place] [address]  CBC     Status: Abnormal   Collection Time: 07/23/22  6:26 AM  Result Value Ref Range   WBC 7.3 4.0 -  10.5 K/uL   RBC 4.38 4.22 - 5.81 MIL/uL   Hemoglobin 11.9 (L) 13.0 - 17.0 g/dL   HCT 35.6 (L) 39.0 - 52.0 %   MCV 81.3 80.0 - 100.0 fL   MCH 27.2 26.0 - 34.0 pg   MCHC 33.4 30.0 - 36.0 g/dL   RDW 13.2 11.5 - 15.5 %   Platelets 370 150 - 400 K/uL   nRBC 0.0 0.0 - 0.2 %    Comment: Performed at [facility], [place] [address], [place], [place] [address]  Glucose, capillary     Status: Abnormal   Collection Time: 07/23/22  6:32 AM  Result Value Ref Range   Glucose-Capillary 153 (H) 70 - 99 mg/dL    Comment: Glucose reference range applies only to samples taken after fasting for at least 8 hours.   Comment 1 Notify RN    Comment 2 Document in Chart   Glucose, capillary     Status: Abnormal   Collection Time: 07/23/22 11:08 AM  Result Value Ref Range   Glucose-Capillary 256 (H) 70 - 99 mg/dL    Comment: Glucose reference range applies only to samples taken after fasting for at least 8 hours.  Glucose, capillary     Status: Abnormal   Collection Time: 07/23/22  4:33 PM  Result Value Ref Range   Glucose-Capillary 167 (H) 70 - 99 mg/dL    Comment: Glucose reference range applies only to samples taken after fasting for at least 8 hours.  Glucose, capillary     Status: Abnormal   Collection Time: 07/23/22  9:11 PM  Result Value Ref Range   Glucose-Capillary 146 (H) 70 - 99 mg/dL    Comment: Glucose reference range applies only to samples taken after fasting for at least 8 hours.   Comment 1 Notify RN   Glucose, capillary     Status: Abnormal   Collection Time: 07/24/22  1:29 AM  Result Value Ref Range   Glucose-Capillary 146 (H) 70 - 99 mg/dL    Comment: Glucose reference range applies only to samples taken after fasting for at least 8 hours.  Uric acid     Status: Abnormal   Collection Time: 07/24/22 10:01 AM  Result Value Ref Range   Uric Acid, Serum 9.1 (H) 3.7 - 8.6 mg/dL    Comment: Performed at [facility] [address] [place], [place] [address]  Glucose,  capillary     Status: Abnormal   Collection Time: 07/24/22  1:19 PM  Result Value  Ref Range   Glucose-Capillary 162 (H) 70 - 99 mg/dL    Comment: Glucose reference range applies only to samples taken after fasting for at least 8 hours.   DG Knee Left Port  Result Date: 07/24/2022 CLINICAL DATA:  Pain.  No injury. EXAM: PORTABLE LEFT KNEE - 1-2 VIEW COMPARISON:  None Available. FINDINGS: No fracture. No dislocation. There are bidirectional enthesopathic changes of the patella. Vascular calcifications are present. No radiopaque foreign body. No effusion. IMPRESSION: Patellofemoral compartment predominant degenerative change. No fracture or dislocation. Electronically Signed   By: Marin Roberts M.D.   On: 07/24/2022 10:33      Blood pressure (!) 144/65, pulse 63, temperature 98.8 F (37.1 C), temperature source Oral, resp. rate 16, height 5\' 9"  (1.753 m), weight 81.3 kg, SpO2 98 %.  Medical Problem List and Plan: 1. Functional deficits secondary to infarct right paramedial ventral pons and multiple small acute infarcts within both cerebellar hemispheres due to severe posterior circulation stenosis status post basilar artery angioplasty 07/18/2022  -patient may shower  -ELOS/Goals:  10-14 days sup to min A with PT and OT, sup with SLP  -Admit to CIR  2.  Antithrombotics: -DVT/anticoagulation:  Pharmaceutical: Heparin  -antiplatelet therapy: Aspirin 81 mg daily and Brilinta 90 mg twice daily. 3. Pain Management: Tylenol as needed 4. Mood/Behavior/Sleep: Aricept 10 mg nightly, Namenda 10 mg twice daily.  -antipsychotic agents: N/A 5. Neuropsych/cognition: This patient is not capable of making decisions on his own behalf. 6. Skin/Wound Care: Routine skin checks 7. Fluids/Electrolytes/Nutrition: Routine in and outs with follow-up chemistries 8.  Hypertension.  Norvasc 10 mg daily, Coreg 3.125 mg twice daily, lisinopril 40 mg daily.  Monitor with increased mobility. BP goal SBP 130-150 9.   Diabetes mellitus with peripheral neuropathy.  Hemoglobin A1c 6.5.  Glucophage 500 mg twice daily, Glucotrol 5 mg daily. Monitor closely as he is on prednisone. 10. Hyperlipidemia.  Lipitor 80mg  daily 11. CKD2. Repeat labs 12. Dementia. Continue Aricept and Namenda  13. Gout L knee. Prednisone 20mg  for 5 days.   Lavon Paganini Angiulli, PA-C 07/24/2022  I have personally performed a face to face diagnostic evaluation of this patient and formulated the key components of the plan.  Additionally, I have personally reviewed laboratory data, imaging studies, as well as relevant notes and concur with the physician assistant's documentation above.  The patient's status has not changed from the original H&P.  Any changes in documentation from the acute care chart have been noted above.  Jennye Boroughs, MD, [facility]

## 2022-07-24 NOTE — Progress Notes (Signed)
Inpatient Rehabilitation Admissions Coordinator   I met at bedside with patient , wife, and daughters. I have a CIR bed and can admit him today. They are in agreement. I will make the arrangements with Acute team and TOC.  Danne Baxter, RN, MSN Rehab Admissions Coordinator [phone] 07/24/2022 11:46 AM

## 2022-07-24 NOTE — Progress Notes (Signed)
Patient arrived on unit, oriented to unit. Reviewed medications, therapy schedule, rehab routine and plan of care. States an understanding of information reviewed. No complications noted at this time. Patient reports no pain and is AX2 [employer]

## 2022-07-24 NOTE — Progress Notes (Incomplete)
Inpatient Rehabilitation Admission Medication Review by a Pharmacist  A complete drug regimen review was completed for this patient to identify any potential clinically significant medication issues.  High Risk Drug Classes Is patient taking? Indication by Medication  Antipsychotic {Receiving?:26196}   Anticoagulant {Receiving?:26196}   Antibiotic {Receiving?:26196}   Opioid {Receiving?:26196}   Antiplatelet {Receiving?:26196} Aspirin, ticagrelor - stroke ppx  Hypoglycemics/insulin {Yes or No?:26198} Glipizide, metformin - DM  Vasoactive Medication {Receiving?:26196}   Chemotherapy {Receiving Chemo?:26197}   Other {Yes or No?:26198} Amlodipine, carvedilol, lisinopril- HTN Atorvastatin - HLD Vit B12 - supplement Donepezil, memantine- memory/dementia Prednisone - gout flare tx     Type of Medication Issue Identified Description of Issue Recommendation(s)  Drug Interaction(s) (clinically significant)     Duplicate Therapy     Allergy     No Medication Administration End Date     Incorrect Dose     Additional Drug Therapy Needed     Significant med changes from prior encounter (inform family/care partners about these prior to discharge). Clonazepam DCd Communicate medication changes with patient/family at discharge  Other       Clinically significant medication issues were identified that warrant physician communication and completion of prescribed/recommended actions by midnight of the next day:  {Yes or No?:26198}  Name of provider notified for urgent issues identified: ***   Provider Method of Notification: ***    Pharmacist comments: *** Per neurology recommendations, continue on aspirin 81mg  once daily and ticagrelor 90mg  twice daily   Time spent performing this drug regimen review (minutes): 20   Thank you for allowing pharmacy to be a part of this patient's care.  , PharmD Clinical Pharmacist

## 2022-07-24 NOTE — Progress Notes (Signed)
Jennye Boroughs, MD  Physician Physical Medicine and Rehabilitation   PMR Pre-admission     Signed   Date of Service: 07/23/2022  3:28 PM  Related encounter: Admission (Current) from 07/16/2022 in [facility]   Signed      Show:Clear all _0 Written_1 Templated_2 Copied  Added by: _3 Cristina Gong, RN_4 Jennye Boroughs, MD  _5 Hover for details PMR Admission Coordinator Pre-Admission Assessment   Patient: ALONZO OWCZARZAK is an 71 y.o., male MRN: [id number] DOB: 24-Dec-1950 Height: _6  (175.3 cm) (per pt) Weight: 81.3 kg   Insurance Information HMO:     PPO: yes     PCP:      IPA:      80/20:      OTHER:  PRIMARY: [employer] Medicare      Policy#: [id number]      Subscriber: pt CM Name: Vida Roller      Phone#: [phone]     Fax#: [phone] Pre-Cert#: [id number] approved for 7 days f/u with Vira Agar phone [phone] same fax or portal      Employer:  Benefits:  Phone #: [phone]     Name: 12/5 Eff. Date: 08/19/20     Deduct: none      Out of Pocket Max: $4500       CIR: $295 co pay per day days 1 until 6      SNF: no co pay per day days 1 until 20; $196 co pay per day days 21 until 100 Outpatient: $35 per visit     Co-Pay: visits per medical neccesity Home Health: 80%      Co-Pay: 20% DME: 80%     Co-Pay: 20% Providers: in network  SECONDARY: none   Financial Counselor:       Phone#:    The Engineer, petroleum" for patients in Inpatient Rehabilitation Facilities with attached "Privacy Act [place] Records" was provided and verbally reviewed with: Family   Emergency Contact Information Contact Information       Name Relation Home Work [address]    [place], [place] [phone]   [phone]         Current Medical History  Patient Admitting Diagnosis: CVA   History of Present Illness: 71 year old male with history of CVA, DM type 2, gout, HLD, HTN, CKD, and dementia. Who presented on 07/16/22 with  intermittent unsteadiness with gait, leaning to left and word finding difficulties.    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 Neurology consulted as well as IR. S/P basilar artery angioplasty . MRI 11/28 with small acute infarct of the right paramedial ventral pons. Repeat MRI 11/30 with multiple new small acute infarcts within both cerebellar hemispheres, right greater than left. 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 2 D echo with EF 60 to 65%. VTE prophylaxis heparin sq. On ASA prior to admit, now on ASA and Brilinta . Home meds of amlodipine an lisinopril for HTN. Both restarted. Home med of atorvastatin for HLD. Increased this admit.    Complete NIHSS TOTAL: 13   Patient's medical record from [facility] has been reviewed by the rehabilitation admission coordinator and physician.   Past Medical History      Past Medical History:  Diagnosis Date   CVA (cerebral infarction)     Diabetes mellitus without complication (HCC)     Gout     Hyperlipidemia     Hypertension  Memory loss     Stroke [facility])     Wears dentures      partial upper    Has the patient had major surgery during 100 days prior to admission? Yes   Family History   family history includes Diabetes in his mother.   Current Medications   Current Facility-Administered Medications:    acetaminophen (TYLENOL) tablet 650 mg, 650 mg, Oral, Q4H PRN, 650 mg at 07/22/22 1225 **OR** acetaminophen (TYLENOL) 160 MG/5ML solution 650 mg, 650 mg, Per Tube, Q4H PRN **OR** acetaminophen (TYLENOL) suppository 650 mg, 650 mg, Rectal, Q4H PRN, Myles Rosenthal A, MD   amLODipine (NORVASC) tablet 10 mg, 10 mg, Oral, Daily, Beulah Gandy A, NP, 10 mg at 07/24/22 8786   aspirin chewable tablet 81 mg, 81 mg, Oral, Daily, 81 mg at 07/24/22 0911 **OR** [DISCONTINUED] aspirin chewable tablet 81 mg, 81 mg, Per Tube, Daily,  Deveshwar, Sanjeev, MD   atorvastatin (LIPITOR) tablet 80 mg, 80 mg, Oral, Daily, Rosalin Hawking, MD, 80 mg at 07/24/22 0911   carvedilol (COREG) tablet 3.125 mg, 3.125 mg, Oral, BID WC, Little Ishikawa, MD, 3.125 mg at 07/24/22 7672   Chlorhexidine Gluconate Cloth 2 % PADS 6 each, 6 each, Topical, Daily, Deveshwar, Willaim Rayas, MD, 6 each at 07/24/22 0912   cyanocobalamin (VITAMIN B12) tablet 1,000 mcg, 1,000 mcg, Oral, Daily, Rosalin Hawking, MD, 1,000 mcg at 07/24/22 0911   donepezil (ARICEPT) tablet 10 mg, 10 mg, Oral, QHS, Rosalin Hawking, MD, 10 mg at 07/23/22 2122   glipiZIDE (GLUCOTROL XL) 24 hr tablet 5 mg, 5 mg, Oral, Q breakfast, Little Ishikawa, MD, 5 mg at 07/24/22 0911   heparin injection 5,000 Units, 5,000 Units, Subcutaneous, Q8H, Rosalin Hawking, MD, 5,000 Units at 07/24/22 0504   insulin aspart (novoLOG) injection 0-9 Units, 0-9 Units, Subcutaneous, TID WC, Henri Medal, RPH, 2 Units at 07/23/22 1739   labetalol (NORMODYNE) injection 10 mg, 10 mg, Intravenous, Q10 min PRN, Palikh, Gaurang M, MD   lisinopril (ZESTRIL) tablet 40 mg, 40 mg, Oral, Daily, Beulah Gandy A, NP, 40 mg at 07/24/22 0911   memantine (NAMENDA) tablet 10 mg, 10 mg, Oral, BID, Rosalin Hawking, MD, 10 mg at 07/24/22 0912   metFORMIN (GLUCOPHAGE-XR) 24 hr tablet 500 mg, 500 mg, Oral, BID WC, Little Ishikawa, MD, 500 mg at 07/24/22 0947   Oral care mouth rinse, 15 mL, Mouth Rinse, PRN, Myles Rosenthal A, MD   predniSONE (DELTASONE) tablet 20 mg, 20 mg, Oral, Q breakfast, Bonnielee Haff, MD, 20 mg at 07/24/22 1013   senna-docusate (Senokot-S) tablet 1 tablet, 1 tablet, Oral, QHS PRN, Clance Boll, MD   ticagrelor (BRILINTA) tablet 90 mg, 90 mg, Oral, BID, 90 mg at 07/24/22 0911 **OR** [DISCONTINUED] ticagrelor (BRILINTA) tablet 90 mg, 90 mg, Per Tube, BID, Luanne Bras, MD   Patients Current Diet:  Diet Order                  Diet Heart Room service appropriate? Yes with Assist; Fluid consistency: Thin   Diet effective now                       Precautions / Restrictions Precautions Precautions: Fall Precaution Comments: moderate left hemiparesis Restrictions Weight Bearing Restrictions: No    Has the patient had 2 or more falls or a fall with injury in the past year? No   Prior Activity Level Community (5-7x/wk): independent, dementia per wife   Prior Functional Level  Self Care: Did the patient need help bathing, dressing, using the toilet or eating? Independent   Indoor Mobility: Did the patient need assistance with walking from room to room (with or without device)? Independent   Stairs: Did the patient need assistance with internal or external stairs (with or without device)? Independent   Functional Cognition: Did the patient need help planning regular tasks such as shopping or remembering to take medications? Needed some help   Patient Information Are you of Hispanic, Latino/a,or Spanish origin?: A. No, not of Hispanic, Latino/a, or Spanish origin What is your race?: B. Black or African American Do you need or want an interpreter to communicate with a doctor or health care staff?: 9. Unable to respond   Patient's Response To:  Health Literacy and Transportation Is the patient able to respond to health literacy and transportation needs?: No Health Literacy - How often do you need to have someone help you when you read instructions, pamphlets, or other written material from your doctor or pharmacy?: Patient unable to respond In the past 12 months, has lack of transportation kept you from medical appointments or from getting medications?: No In the past 12 months, has lack of transportation kept you from meetings, work, or from getting things needed for daily living?: No   Development worker, international aid / Dike Devices/Equipment: Eyeglasses Home Equipment: Shower seat, Radio producer - single point   Prior Device Use: Indicate devices/aids used by the patient prior  to current illness, exacerbation or injury? None of the above   Current Functional Level Cognition   Arousal/Alertness: Awake/alert Overall Cognitive Status: No family/caregiver present to determine baseline cognitive functioning Orientation Level: Oriented to person, Oriented to place, Disoriented to time, Disoriented to situation General Comments: Difficult to understand speech, but pt can repeat slower and louder and usually able to understand; pt initially stating he does not want to try standing, asking "why do i need to do that?" ultimately wanted to work with therapies Attention: Sustained Sustained Attention: Appears intact Memory:  (intact for biographical information)    Extremity Assessment (includes Sensation/Coordination)   Upper Extremity Assessment: LUE deficits/detail LUE Deficits / Details: Brunnstrum stage IV in the arm and stage V in the hand.  AAROM WFLs for all joints, actively demonstrates shoulder flexion in synergy pattern to approximately 70 degrees.  Gross grasp present with digit extension at approximately 60% for digit 2 and 80% for digits 3-5. LUE Sensation: decreased light touch LUE Coordination: decreased fine motor, decreased gross motor  Lower Extremity Assessment: RLE deficits/detail, LLE deficits/detail RLE Deficits / Details: generally WFL LLE Deficits / Details: generally weak at ~ 3/5, movement mostly synergistic, but some ability to isolate. LLE Coordination: decreased fine motor     ADLs   Overall ADL's : Needs assistance/impaired Eating/Feeding: Supervision/ safety, Sitting Eating/Feeding Details (indicate cue type and reason): RUE only Upper Body Bathing: Minimal assistance, Sitting Upper Body Bathing Details (indicate cue type and reason): simulated Lower Body Bathing: Maximal assistance, Sit to/from stand Lower Body Bathing Details (indicate cue type and reason): simulated Upper Body Dressing : Sitting, Moderate assistance Upper Body  Dressing Details (indicate cue type and reason): simulated Lower Body Dressing: Total assistance, Sit to/from stand Toilet Transfer: Moderate assistance, +2 for physical assistance, +2 for safety/equipment, Stand-pivot Toilet Transfer Details (indicate cue type and reason): simulated to bedside recliner Toileting- Clothing Manipulation and Hygiene: Total assistance, Sitting/lateral lean, Sit to/from stand Toileting - Clothing Manipulation Details (indicate cue type and reason): unable to complete  peri-care in standing Functional mobility during ADLs: Moderate assistance, +2 for physical assistance, +2 for safety/equipment, Cueing for safety, Cueing for sequencing, Rolling walker (2 wheels) General ADL Comments: Session focus on ADLs, transfers, and inceasing overall independence     Mobility   Overal bed mobility: Needs Assistance Bed Mobility: Supine to Sit, Sit to Supine Supine to sit: Mod assist, HOB elevated Sit to supine: Mod assist General bed mobility comments: cues for sequencing, increased time     Transfers   Overall transfer level: Needs assistance Equipment used: None, Rolling walker (2 wheels) Transfers: Sit to/from Stand, Bed to chair/wheelchair/BSC Sit to Stand: Mod assist, Max assist Bed to/from chair/wheelchair/BSC transfer type:: Step pivot Step pivot transfers: Mod assist, +2 physical assistance  Lateral/Scoot Transfers: Mod assist General transfer comment: able to stand x3 with mod-max assist with RW and cues for hand placement, able to side step to [facility] with multimodal cues     Ambulation / Gait / Stairs / Wheelchair Mobility   Ambulation/Gait General Gait Details: unable due to LLE pain and weakness; side steps to HOB only     Posture / Balance Dynamic Sitting Balance Sitting balance - Comments: closeguarding assist, but no physical assist; slight left lean Balance Overall balance assessment: Needs assistance Sitting-balance support: Feet supported, Single  extremity supported Sitting balance-Leahy Scale: Poor Sitting balance - Comments: closeguarding assist, but no physical assist; slight left lean Postural control: Posterior lean, Right lateral lean Standing balance support: Bilateral upper extremity supported Standing balance-Leahy Scale: Poor Standing balance comment: pt reliant on external support     Special needs/care consideration Fall precautions    Previous Home Environment  Living Arrangements: Spouse/significant other  Lives With: Spouse Available Help at Discharge: Family, Available 24 hours/day Type of Home: House Home Layout: One level Home Access: Stairs to enter Entrance Stairs-Rails: Can reach both, Left, Right Entrance Stairs-Number of Steps: 3 Bathroom Shower/Tub: Multimedia programmer: Standard Bathroom Accessibility: Yes How Accessible: Accessible via walker, Accessible via wheelchair Home Care Services: No   Discharge Living Setting Plans for Discharge Living Setting: Patient's home, Lives with (comment) (wife) Type of Home at Discharge: House Discharge Home Layout: One level Discharge Home Access: Stairs to enter Entrance Stairs-Rails: Right, Left, Can reach both Entrance Stairs-Number of Steps: 3 Discharge Bathroom Shower/Tub: Walk-in shower Discharge Bathroom Toilet: Standard Discharge Bathroom Accessibility: Yes How Accessible: Accessible via walker Does the patient have any problems obtaining your medications?: No   Social/Family/Support Systems Patient Roles: Spouse Contact Information: wife, Deneise Lever Anticipated Caregiver: wife Anticipated Ambulance person Information: see contacts Caregiver Availability: 24/7 Discharge Plan Discussed with Primary Caregiver: Yes Is Caregiver In Agreement with Plan?: Yes Does Caregiver/Family have Issues with Lodging/Transportation while Pt is in Rehab?: No   Goals Patient/Family Goal for Rehab: supervision to min asisst with PT and OT, supervision  with SLP Expected length of stay: ELOS 10 to 14 days Pt/Family Agrees to Admission and willing to participate: Yes Program Orientation Provided & Reviewed with Pt/Caregiver Including Roles  & Responsibilities: Yes   Decrease burden of Care through IP rehab admission: n/a   Possible need for SNF placement upon discharge: not anticipated   Patient Condition: I have reviewed medical records from [facility], spoken with CM, and patient and spouse. I met with patient at the bedside for inpatient rehabilitation assessment.  Patient will benefit from ongoing PT, OT, and SLP, can actively participate in 3 hours of therapy a day 5 days of the week, and can make  measurable gains during the admission.  Patient will also benefit from the coordinated team approach during an Inpatient Acute Rehabilitation admission.  The patient will receive intensive therapy as well as Rehabilitation physician, nursing, social worker, and care management interventions.  Due to bladder management, bowel management, safety, skin/wound care, disease management, medication administration, pain management, and patient education the patient requires 24 hour a day rehabilitation nursing.  The patient is currently mod to max assist overall with mobility and basic ADLs.  Discharge setting and therapy post discharge at home with home health is anticipated.  Patient has agreed to participate in the Acute Inpatient Rehabilitation Program and will admit today.   Preadmission Screen Completed By:  Cleatrice Burke, 07/24/2022 12:26 PM ______________________________________________________________________   Discussed status with Dr. Curlene Dolphin on 07/24/22 at 48 and received approval for admission today.   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 Yes Co-Morbidities requiring supervision/potential complications: HTN, DM, HLD Due to bladder management, bowel management, safety, skin/wound care, disease management, medication administration, pain management, and patient education, does the patient require 24 hr/day rehab nursing? Yes Does the patient require coordinated care of a physician, rehab nurse, PT, OT, and SLP to address physical and functional deficits in the context of the above medical diagnosis(es)? Yes Addressing deficits in the following areas: balance, endurance, locomotion, strength, transferring, bowel/bladder control, bathing, dressing, feeding, grooming, toileting, cognition, speech, language, swallowing, and psychosocial support Can the patient actively participate in an intensive therapy program of at least 3 hrs of therapy 5 days a week? Yes The potential for patient to make measurable gains while on inpatient rehab is excellent Anticipated functional outcomes upon discharge from inpatient rehab: supervision and min assist PT, supervision and min assist OT, supervision SLP Estimated rehab length of stay to reach the above functional goals is: 10-14 Anticipated discharge destination: Home 10. Overall Rehab/Functional Prognosis: excellent     MD Signature: Jennye Boroughs         Revision History

## 2022-07-24 NOTE — Progress Notes (Signed)
   07/24/22 1250  Clinical Encounter Type  Visited With Patient  Visit Type Initial  Referral From Nurse  Consult/Referral To Chaplain   Chaplain spoke with patient in follow-up to Spiritual Consult list. 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 He indicated that his wife will serve as the [facility]; therefore, this service was not needed at this time.   Jon Gills, Resident Chaplain [phone]

## 2022-07-25 DIAGNOSIS — I6350 Cerebral infarction due to unspecified occlusion or stenosis of unspecified cerebral artery: Secondary | ICD-10-CM | POA: Diagnosis not present

## 2022-07-25 LAB — COMPREHENSIVE METABOLIC PANEL
ALT: 32 U/L (ref 0–44)
AST: 20 U/L (ref 15–41)
Albumin: 3.1 g/dL — ABNORMAL LOW (ref 3.5–5.0)
Alkaline Phosphatase: 49 U/L (ref 38–126)
Anion gap: 8 (ref 5–15)
BUN: 36 mg/dL — ABNORMAL HIGH (ref 8–23)
CO2: 24 mmol/L (ref 22–32)
Calcium: 9.3 mg/dL (ref 8.9–10.3)
Chloride: 109 mmol/L (ref 98–111)
Creatinine, Ser: 1.69 mg/dL — ABNORMAL HIGH (ref 0.61–1.24)
GFR, Estimated: 43 mL/min — ABNORMAL LOW (ref >60)
Glucose, Bld: 105 mg/dL — ABNORMAL HIGH (ref 70–99)
Potassium: 4.2 mmol/L (ref 3.5–5.1)
Sodium: 141 mmol/L (ref 135–145)
Total Bilirubin: 0.4 mg/dL (ref 0.3–1.2)
Total Protein: 7.1 g/dL (ref 6.5–8.1)

## 2022-07-25 LAB — CBC WITH DIFFERENTIAL/PLATELET
Abs Immature Granulocytes: 0.04 10*3/uL (ref 0.00–0.07)
Basophils Absolute: 0.1 10*3/uL (ref 0.0–0.1)
Basophils Relative: 1 %
Eosinophils Absolute: 0 10*3/uL (ref 0.0–0.5)
Eosinophils Relative: 0 %
HCT: 36.4 % — ABNORMAL LOW (ref 39.0–52.0)
Hemoglobin: 12 g/dL — ABNORMAL LOW (ref 13.0–17.0)
Immature Granulocytes: 0 %
Lymphocytes Relative: 23 %
Lymphs Abs: 2.2 10*3/uL (ref 0.7–4.0)
MCH: 27.1 pg (ref 26.0–34.0)
MCHC: 33 g/dL (ref 30.0–36.0)
MCV: 82.4 fL (ref 80.0–100.0)
Monocytes Absolute: 0.9 10*3/uL (ref 0.1–1.0)
Monocytes Relative: 10 %
Neutro Abs: 6.3 10*3/uL (ref 1.7–7.7)
Neutrophils Relative %: 66 %
Platelets: 433 10*3/uL — ABNORMAL HIGH (ref 150–400)
RBC: 4.42 MIL/uL (ref 4.22–5.81)
RDW: 13 % (ref 11.5–15.5)
WBC: 9.5 10*3/uL (ref 4.0–10.5)
nRBC: 0 % (ref 0.0–0.2)

## 2022-07-25 LAB — GLUCOSE, CAPILLARY
Glucose-Capillary: 80 mg/dL (ref 70–99)
Glucose-Capillary: 246 mg/dL — ABNORMAL HIGH (ref 70–99)
Glucose-Capillary: 310 mg/dL — ABNORMAL HIGH (ref 70–99)
Glucose-Capillary: 239 mg/dL — ABNORMAL HIGH (ref 70–99)
Glucose-Capillary: 210 mg/dL — ABNORMAL HIGH (ref 70–99)

## 2022-07-25 LAB — VITAMIN D 25 HYDROXY (VIT D DEFICIENCY, FRACTURES): Vit D, 25-Hydroxy: 28.46 ng/mL — ABNORMAL LOW (ref 30–100)

## 2022-07-25 LAB — MAGNESIUM: Magnesium: 2.1 mg/dL (ref 1.7–2.4)

## 2022-07-25 MED ORDER — SENNOSIDES-DOCUSATE SODIUM 8.6-50 MG PO TABS: 2.0000 | ORAL_TABLET | Freq: Every evening | ORAL | Status: DC | PRN

## 2022-07-25 MED ORDER — CARVEDILOL 3.125 MG PO TABS: 3.1250 mg | ORAL_TABLET | Freq: Every day | ORAL | Status: DC

## 2022-07-25 MED ORDER — GLIPIZIDE ER 5 MG PO TB24: 5.0000 mg | ORAL_TABLET | Freq: Every day | ORAL | Status: DC

## 2022-07-25 MED ORDER — POLYETHYLENE GLYCOL 3350 17 G PO PACK
17.0000 g | PACK | Freq: Every day | ORAL | Status: DC
  Administered 2022-07-25 – 2022-07-29 (×5): 17 g via ORAL
  Filled 2022-07-25 (×5): qty 1

## 2022-07-25 NOTE — Progress Notes (Signed)
Inpatient Rehabilitation Center Individual Statement of Services  Patient Name:  Mitchell Washington  Date:  07/25/2022  Welcome to the Inpatient Rehabilitation Center.  Our goal is to provide you with an individualized program based on your diagnosis and situation, designed to meet your specific needs.  With this comprehensive rehabilitation program, you will be expected to participate in at least 3 hours of rehabilitation therapies Monday-Friday, with modified therapy programming on the weekends.  Your rehabilitation program will include the following services:  Physical Therapy (PT), Occupational Therapy (OT), Speech Therapy (ST), 24 hour per day rehabilitation nursing, Therapeutic Recreaction (TR), Care Coordinator, Rehabilitation Medicine, Nutrition Services, and Pharmacy Services  Weekly team conferences will be held on Wednesday to discuss your progress.  Your Inpatient Rehabilitation Care Coordinator will talk with you frequently to get your input and to update you on team discussions.  Team conferences with you and your family in attendance may also be held.  Expected length of stay: 2 weeks  Overall anticipated outcome: min assist level  Depending on your progress and recovery, your program may change. Your Inpatient Rehabilitation Care Coordinator will coordinate services and will keep you informed of any changes. Your Inpatient Rehabilitation Care Coordinator's name and contact numbers are listed  below.  The following services may also be recommended but are not provided by the Inpatient Rehabilitation Center:   Home Health Rehabiltiation Services Outpatient Rehabilitation Services    Arrangements will be made to provide these services after discharge if needed.  Arrangements include referral to agencies that provide these services.  Your insurance has been verified to be:  [employer] Your primary doctor is:  Corrinne Eagle  Pertinent information will be shared with your doctor  and your insurance company.  Inpatient Rehabilitation Care Coordinator:  Dossie Der, [place] [phone] or Luna Glasgow  Information discussed with and copy given to patient by: [name], 07/25/2022, 1:31 PM

## 2022-07-25 NOTE — Discharge Instructions (Addendum)
Inpatient Rehab Discharge Instructions  Mitchell Washington Discharge date and time: No discharge date for patient encounter.   Activities/Precautions/ Functional Status: Activity: activity as tolerated Diet: Diabetic diet Wound Care: Routine skin checks Functional status:  ___ No restrictions     ___ Walk up steps independently ___ 24/7 supervision/assistance   ___ Walk up steps with assistance ___ Intermittent supervision/assistance  ___ Bathe/dress independently ___ Walk with walker     _x__ Bathe/dress with assistance ___ Walk Independently    ___ Shower independently ___ Walk with assistance    ___ Shower with assistance ___ No alcohol     ___ Return to work/school ________  Special Instructions:  No driving smoking or alcohol  Continue to hold Coreg due to low blood pressure readings as well as hold metformin due to renal insufficiency until follow-up with PCP  COMMUNITY REFERRALS UPON DISCHARGE:    Home Health:   PT  OT   SP                 Agency:[facility]    [phone]   Medical Equipment/Items Ordered: TRANSPORT CHAIR & 3 IN 1 WIFE TO GET ROLLING WALKER ON OWN SINCE NOT COVERED                                                 Agency/Supplier:[facility]  [phone]     STROKE/TIA DISCHARGE INSTRUCTIONS SMOKING Cigarette smoking nearly doubles your risk of having a stroke & is the single most alterable risk factor  If you smoke or have smoked in the last 12 months, you are advised to quit smoking for your health. Most of the excess cardiovascular risk related to smoking disappears within a year of stopping. Ask you doctor about anti-smoking medications [facility] Quit Line: 1-800-QUIT NOW Free Smoking Cessation Classes [phone]  CHOLESTEROL Know your levels; limit fat & cholesterol in your diet  Lipid Panel     Component Value Date/Time   CHOL 105 07/16/2022 0510   TRIG 80 07/16/2022 0510   HDL 31 (L) 07/16/2022 0510   CHOLHDL 3.4 07/16/2022 0510    VLDL 16 07/16/2022 0510   LDLCALC 58 07/16/2022 0510     Many patients benefit from treatment even if their cholesterol is at goal. Goal: Total Cholesterol (CHOL) less than 160 Goal:  Triglycerides (TRIG) less than 150 Goal:  HDL greater than 40 Goal:  LDL (LDLCALC) less than 100   BLOOD PRESSURE American Stroke Association blood pressure target is less that 120/80 mm/Hg  Your discharge blood pressure is:  BP: (!) 146/66 Monitor your blood pressure Limit your salt and alcohol intake Many individuals will require more than one medication for high blood pressure  DIABETES (A1c is a blood sugar average for last 3 months) Goal HGBA1c is under 7% (HBGA1c is blood sugar average for last 3 months)  Diabetes:    Lab Results  Component Value Date   HGBA1C 6.5 (H) 07/16/2022    Your HGBA1c can be lowered with medications, healthy diet, and exercise. Check your blood sugar as directed by your physician Call your physician if you experience unexplained or low blood sugars.  PHYSICAL ACTIVITY/REHABILITATION Goal is 30 minutes at least 4 days per week  Activity: Increase activity slowly, Therapies: Physical Therapy: Home Health Return to work:  Activity decreases your risk of heart attack and  stroke and makes your heart stronger.  It helps control your weight and blood pressure; helps you relax and can improve your mood. Participate in a regular exercise program. Talk with your doctor about the best form of exercise for you (dancing, walking, swimming, cycling).  DIET/WEIGHT Goal is to maintain a healthy weight  Your discharge diet is:  Diet Order             Diet Heart Room service appropriate? Yes with Assist; Fluid consistency: Thin  Diet effective now                   liquids Your height is:  Height: 5\' 9"  (175.3 cm) Your current weight is: Weight: 77.1 kg Your Body Mass Index (BMI) is:  BMI (Calculated): 25.09 Following the type of diet specifically designed for you will help  prevent another stroke. Your goal weight range is:   Your goal Body Mass Index (BMI) is 19-24. Healthy food habits can help reduce 3 risk factors for stroke:  High cholesterol, hypertension, and excess weight.  RESOURCES Stroke/Support Group:  Call [phone]   STROKE EDUCATION PROVIDED/REVIEWED AND GIVEN TO PATIENT Stroke warning signs and symptoms How to activate emergency medical system (call 911). Medications prescribed at discharge. Need for follow-up after discharge. Personal risk factors for stroke. Pneumonia vaccine given: No Flu vaccine given: No My questions have been answered, the writing is legible, and I understand these instructions.  I will adhere to these goals & educational materials that have been provided to me after my discharge from the hospital.     My questions have been answered and I understand these instructions. I will adhere to these goals and the provided educational materials after my discharge from the hospital.  Patient/Caregiver Signature _______________________________ Date __________  Clinician Signature _______________________________________ Date __________  Please bring this form and your medication list with you to all your follow-up doctor's appointments.

## 2022-07-25 NOTE — Progress Notes (Signed)
Inpatient Rehabilitation  Patient information reviewed and entered into eRehab system by Quintus Premo M. Kenli Waldo, M.A., CCC/SLP, PPS Coordinator.  Information including medical coding, functional ability and quality indicators will be reviewed and updated through discharge.    

## 2022-07-25 NOTE — Plan of Care (Signed)
  Problem: RH Bathing Goal: LTG Patient will bathe all body parts with assist levels (OT) Description: LTG: Patient will bathe all body parts with assist levels (OT) Flowsheets (Taken 07/25/2022 1559) LTG: Pt will perform bathing with assistance level/cueing: Minimal Assistance - Patient > 75% LTG: Position pt will perform bathing: Shower   Problem: RH Dressing Goal: LTG Patient will perform upper body dressing (OT) Description: LTG Patient will perform upper body dressing with assist, with/without cues (OT). Flowsheets (Taken 07/25/2022 1559) LTG: Pt will perform upper body dressing with assistance level of: Minimal Assistance - Patient > 75% Goal: LTG Patient will perform lower body dressing w/assist (OT) Description: LTG: Patient will perform lower body dressing with assist, with/without cues in positioning using equipment (OT) Flowsheets (Taken 07/25/2022 1559) LTG: Pt will perform lower body dressing with assistance level of: Minimal Assistance - Patient > 75%   Problem: RH Toileting Goal: LTG Patient will perform toileting task (3/3 steps) with assistance level (OT) Description: LTG: Patient will perform toileting task (3/3 steps) with assistance level (OT)  Flowsheets (Taken 07/25/2022 1559) LTG: Pt will perform toileting task (3/3 steps) with assistance level: Minimal Assistance - Patient > 75%   Problem: RH Functional Use of Upper Extremity Goal: LTG Patient will use RT/LT upper extremity as a (OT) Description: LTG: Patient will use right/left upper extremity as a stabilizer/gross assist/diminished/nondominant/dominant level with assist, with/without cues during functional activity (OT) Flowsheets (Taken 07/25/2022 1559) LTG: Use of upper extremity in functional activities: LUE as gross assist level   Problem: RH Toilet Transfers Goal: LTG Patient will perform toilet transfers w/assist (OT) Description: LTG: Patient will perform toilet transfers with assist, with/without cues using  equipment (OT) Flowsheets (Taken 07/25/2022 1559) LTG: Pt will perform toilet transfers with assistance level of: Minimal Assistance - Patient > 75%   Problem: RH Tub/Shower Transfers Goal: LTG Patient will perform tub/shower transfers w/assist (OT) Description: LTG: Patient will perform tub/shower transfers with assist, with/without cues using equipment (OT) Flowsheets (Taken 07/25/2022 1559) LTG: Pt will perform tub/shower stall transfers with assistance level of: Minimal Assistance - Patient > 75% LTG: Pt will perform tub/shower transfers from: Tub/shower combination

## 2022-07-25 NOTE — Progress Notes (Signed)
Inpatient Rehabilitation Admission Medication Review by a Pharmacist  A complete drug regimen review was completed for this patient to identify any potential clinically significant medication issues.  High Risk Drug Classes Is patient taking? Indication by Medication  Antipsychotic No   Anticoagulant Yes Heparin SQ - VTE ppx  Antibiotic No   Opioid No   Antiplatelet Yes Aspirin, ticagrelor - stroke ppx  Hypoglycemics/insulin Yes Glipizide, metformin, insulin aspart - DM  Vasoactive Medication Yes Amlodipine, carvedilol, lisinopril- HTN  Chemotherapy No   Other Yes Atorvastatin - HLD Vit B12 - supplement Donepezil, memantine- memory/dementia Prednisone - gout flare tx Senokot-S - constipation     Type of Medication Issue Identified Description of Issue Recommendation(s)  Drug Interaction(s) (clinically significant)     Duplicate Therapy     Allergy     No Medication Administration End Date     Incorrect Dose     Additional Drug Therapy Needed     Significant med changes from prior encounter (inform family/care partners about these prior to discharge). Clonazepam DCd Communicate medication changes with patient/family at discharge  Other       Clinically significant medication issues were identified that warrant physician communication and completion of prescribed/recommended actions by midnight of the next day:  No   Pharmacist comments: Per neurology recommendations, continue on aspirin 81mg  once daily and ticagrelor 90mg  twice daily upon discharge   Time spent performing this drug regimen review (minutes): 20   Thank you for allowing pharmacy to be a part of this patient's care.  , PharmD Clinical Pharmacist

## 2022-07-25 NOTE — Plan of Care (Signed)
  Problem: Consults Goal: RH STROKE PATIENT EDUCATION Description: See Patient Education module for education specifics  Outcome: Progressing   Problem: RH BOWEL ELIMINATION Goal: RH STG MANAGE BOWEL WITH ASSISTANCE Description: STG Manage Bowel with min Assistance. Outcome: Progressing Goal: RH STG MANAGE BOWEL W/MEDICATION W/ASSISTANCE Description: STG Manage Bowel with Medication with min Assistance. Outcome: Progressing   Problem: RH BLADDER ELIMINATION Goal: RH STG MANAGE BLADDER WITH ASSISTANCE Description: STG Manage Bladder With min Assistance Outcome: Progressing   Problem: RH SKIN INTEGRITY Goal: RH STG SKIN FREE OF INFECTION/BREAKDOWN Description: Skin will be free of infection/breakdown with min assist Outcome: Progressing Goal: RH STG ABLE TO PERFORM INCISION/WOUND CARE W/ASSISTANCE Description: STG Able To Perform Incision/Wound Care With min Assistance. Outcome: Progressing   Problem: RH SAFETY Goal: RH STG ADHERE TO SAFETY PRECAUTIONS W/ASSISTANCE/DEVICE Description: STG Adhere to Safety Precautions With cueing Assistance/Device. Outcome: Progressing   Problem: RH COGNITION-NURSING Goal: RH STG USES MEMORY AIDS/STRATEGIES W/ASSIST TO PROBLEM SOLVE Description: STG Uses Memory Aids/Strategies With min Assistance to Problem Solve. Outcome: Progressing   Problem: RH PAIN MANAGEMENT Goal: RH STG PAIN MANAGED AT OR BELOW PT'S PAIN GOAL Description: Pain will be managed at 4 out of 10 on pain scale with prn medications min assist Outcome: Progressing   Problem: RH KNOWLEDGE DEFICIT Goal: RH STG INCREASE KNOWLEDGE OF HYPERTENSION Description: Patient/caregiver will be able to managed HTN medications from nursing education and nursing handouts independently  Outcome: Progressing Goal: RH STG INCREASE KNOWLEDGE OF STROKE PROPHYLAXIS Description: Patient/caregiver will be able to manage mediations related to stroke prophylaxis from nursing education and nursing  handouts independently  Outcome: Progressing   Problem: Education: Goal: Understanding of CV disease, CV risk reduction, and recovery process will improve Outcome: Progressing Goal: Individualized Educational Video(s) Outcome: Progressing   Problem: Activity: Goal: Ability to return to baseline activity level will improve Outcome: Progressing   Problem: Cardiovascular: Goal: Ability to achieve and maintain adequate cardiovascular perfusion will improve Outcome: Progressing Goal: Vascular access site(s) Level 0-1 will be maintained Outcome: Progressing   Problem: Health Behavior/Discharge Planning: Goal: Ability to safely manage health-related needs after discharge will improve Outcome: Progressing   Problem: Education: Goal: Knowledge of disease or condition will improve Outcome: Progressing Goal: Knowledge of secondary prevention will improve (MUST DOCUMENT ALL) Outcome: Progressing Goal: Knowledge of patient specific risk factors will improve Loraine Leriche N/A or DELETE if not current risk factor) Outcome: Progressing   Problem: Ischemic Stroke/TIA Tissue Perfusion: Goal: Complications of ischemic stroke/TIA will be minimized Outcome: Progressing   Problem: Coping: Goal: Will verbalize positive feelings about self Outcome: Progressing Goal: Will identify appropriate support needs Outcome: Progressing   Problem: Health Behavior/Discharge Planning: Goal: Ability to manage health-related needs will improve Outcome: Progressing Goal: Goals will be collaboratively established with patient/family Outcome: Progressing

## 2022-07-25 NOTE — Plan of Care (Signed)
  Problem: RH Cognition - SLP Goal: RH LTG Patient will demonstrate orientation with cues Description:  LTG:  Patient will demonstrate orientation to person/place/time/situation with cues (SLP)   Flowsheets (Taken 07/25/2022 1203) LTG Patient will demonstrate orientation to:  Place  Time  Situation LTG: Patient will demonstrate orientation using cueing (SLP): Minimal Assistance - Patient > 75%   Problem: RH Expression Communication Goal: LTG Patient will increase speech intelligibility (SLP) Description: LTG: Patient will increase speech intelligibility at word/phrase/conversation level with cues, % of the time (SLP) Flowsheets (Taken 07/25/2022 1203) LTG: Patient will increase speech intelligibility (SLP): Minimal Assistance - Patient > 75% Level: Phrase Percent of time patient will use intelligible speech: 80   Problem: RH Awareness Goal: LTG: Patient will demonstrate awareness during functional activites type of (SLP) Description: LTG: Patient will demonstrate awareness during functional activites type of (SLP) Flowsheets (Taken 07/25/2022 1203) Patient will demonstrate during cognitive/linguistic activities awareness type of:  Intellectual  Emergent LTG: Patient will demonstrate awareness during cognitive/linguistic activities with assistance of (SLP): Moderate Assistance - Patient 50 - 74%   Problem: RH Expression Communication Goal: LTG Patient will express needs/wants via multi-modal(SLP) Description: LTG:  Patient will express needs/wants via multi-modal communication (gestures/written, etc) with cues (SLP) Flowsheets (Taken 07/25/2022 1203) LTG: Patient will express needs/wants via multimodal communication (gestures/written, etc) with cueing (SLP): Minimal Assistance - Patient > 75%

## 2022-07-25 NOTE — Progress Notes (Signed)
Patient ID: Mitchell Washington, male   DOB: 1951-06-05, 71 y.o.   MRN: [id number] Met with the patient to review current situation, rehab process, team conference and plan of care. Reviewed CVA x 2 with hx of HTN, HLD , DM (A1C 6.5) along with ASA + Brilinta. Patient reports some soreness however denies pain, discomfort. Discussed medications and dietary modification recommendations and educational notebook for reference at home. Continue to follow along to discharge to address educuational needs to facilitate preparation for discharge. Margarito Liner

## 2022-07-25 NOTE — Evaluation (Signed)
Physical Therapy Assessment and Plan  Patient Details  Name: Mitchell Washington MRN: [id number] Date of Birth: Nov 16, 1950  PT Diagnosis: Abnormality of gait, Cognitive deficits, Difficulty walking, Hemiplegia non-dominant, Impaired cognition, Muscle weakness, and Pain in L hemibody Rehab Potential: Fair ELOS: 2 weeks   Today's Date: 07/25/2022 PT Individual Time: 4098-1191 PT Individual Time Calculation (min): 58 min  and Today's Date: 07/25/2022 PT Missed Time: 17 Minutes Missed Time Reason: Patient ill (Comment);Other (Comment) (vasovagal)   Hospital Problem: Principal Problem:   Posterior circulation stroke [facility])   Past Medical History:  Past Medical History:  Diagnosis Date   CVA (cerebral infarction)    Diabetes mellitus without complication ([place])    Gout    Hyperlipidemia    Hypertension    Memory loss    Stroke [facility])    Wears dentures    partial upper   Past Surgical History:  Past Surgical History:  Procedure Laterality Date   APPENDECTOMY     CATARACT EXTRACTION W/PHACO Left 10/25/2019   Procedure: CATARACT EXTRACTION PHACO AND INTRAOCULAR LENS PLACEMENT (IOC) LEFT DIABETIC INTRAVEITREAL KENALOG INJECTION 1.93  00:22.6;  Surgeon: Eulogio Bear, MD;  Location: [place];  Service: Ophthalmology;  Laterality: Left;  Diabetic - oral meds   CATARACT EXTRACTION W/PHACO Right 11/15/2019   Procedure: CATARACT EXTRACTION PHACO AND INTRAOCULAR LENS PLACEMENT ([place]) RIGHT DIABETIC;  Surgeon: Eulogio Bear, MD;  Location: [place];  Service: Ophthalmology;  Laterality: Right;  0.92 0 ;19.7   IR ANGIO INTRA EXTRACRAN SEL COM CAROTID INNOMINATE BILAT MOD SED  07/16/2022   IR ANGIO VERTEBRAL SEL SUBCLAVIAN INNOMINATE UNI R MOD SED  07/16/2022   IR ANGIO VERTEBRAL SEL VERTEBRAL UNI L MOD SED  07/16/2022   IR ANGIO VERTEBRAL SEL VERTEBRAL UNI L MOD SED  07/22/2022   IR CT HEAD LTD  07/18/2022   IR PTA INTRACRANIAL  07/18/2022   RADIOLOGY WITH ANESTHESIA  N/A 07/18/2022   Procedure: Cerebral angioplasty with possible stenting;  Surgeon: Luanne Bras, MD;  Location: [place];  Service: Radiology;  Laterality: N/A;    Assessment & Plan Clinical Impression: Patient is a 71 year old right-handed male with history of prior CVA with no residual deficits, gout, diabetes mellitus, hypertension, hyperlipidemia, memory loss maintained on Namenda as well as Aricept, REM sleep behavior disorder, tremor.  Per chart review patient lives with spouse.  1 level home 3 steps to entry.  Reportedly independent prior to admission.  Presented 07/15/2022 with acute onset of left-sided weakness/dysarthria and gait disturbance.  Initial cranial CT scan negative, MRI of the brain 11/28 remarkable for small CVA right paramedian ventral pons.  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  Patient underwent arteriogram with balloon angioplasty x 2 07/18/2022 per interventional radiology.  Follow-up MRI 07/19/2022 multiple new small acute infarcts within both cerebellar hemispheres, right greater than left. unchanged appearance of right paramedian pontine infarct.  No new intracranial occlusion. Echo with EF 60-65%.  Patient currently maintained on aspirin 81 mg daily and Brilinta 90 mg twice daily.  Subcutaneous heparin added for DVT prophylaxis.  Tolerating a regular consistency diet.  He was started on 5 day course of prednisone for left knee gout flare. Therapy evaluations completed due to patient decreased mobility left-sided weakness was admitted for a comprehensive rehab program.  Patient transferred to CIR on 07/24/2022 .   Patient currently requires mod to max with mobility secondary to muscle weakness, decreased cardiorespiratoy  endurance, unbalanced muscle activation and motor apraxia, decreased attention to left, decreased initiation, decreased attention, decreased awareness, decreased problem solving,  decreased safety awareness, decreased memory, and delayed processing, and decreased sitting balance, decreased standing balance, decreased postural control, hemiplegia, and decreased balance strategies.  Prior to hospitalization, patient was independent  with mobility and lived with Spouse in a House home.  Home access is 3Stairs to enter.  Patient will benefit from skilled PT intervention to maximize safe functional mobility, minimize fall risk, and decrease caregiver burden for planned discharge home with 24 hour assist.  Anticipate patient will benefit from follow up [facility] at discharge.  PT - End of Session Activity Tolerance: Tolerates < 10 min activity, no significant change in vital signs Endurance Deficit: Yes Endurance Deficit Description: Multiple rest breaks needed b/w simple functional mobility tasks PT Assessment Rehab Potential (ACUTE/IP ONLY): Fair PT Barriers to Discharge: Decreased caregiver support;Home environment access/layout;Lack of/limited family support;Insurance for SNF coverage PT Patient demonstrates impairments in the following area(s): Balance;Endurance;Motor;Pain;Sensory;Skin Integrity;Safety PT Transfers Functional Problem(s): Bed Mobility;Car;Bed to Chair PT Locomotion Functional Problem(s): Ambulation;Stairs PT Plan PT Intensity: Minimum of 1-2 x/day ,45 to 90 minutes PT Frequency: 5 out of 7 days PT Duration Estimated Length of Stay: 2 weeks PT Treatment/Interventions: Ambulation/gait training;Balance/vestibular training;Cognitive remediation/compensation;Community reintegration;Discharge planning;Disease management/prevention;DME/adaptive equipment instruction;Functional electrical stimulation;Functional mobility training;Neuromuscular re-education;Pain management;Patient/family education;Psychosocial support;Splinting/orthotics;Stair training;Therapeutic Activities;Therapeutic Exercise;UE/LE Strength taining/ROM;UE/LE Coordination activities;Visual/perceptual  remediation/compensation;Wheelchair propulsion/positioning PT Transfers Anticipated Outcome(s): CGA with LRAD PT Locomotion Anticipated Outcome(s): minA with LRAD PT Recommendation Recommendations for Other Services: Neuropsych consult Follow Up Recommendations: Home health PT;24 hour supervision/assistance Patient destination: Home Equipment Recommended: To be determined   PT Evaluation Precautions/Restrictions Precautions Precautions: Fall Precaution Comments: L hemi, dementia Restrictions Weight Bearing Restrictions: No General PT Amount of Missed Time (min): 17 Minutes PT Missed Treatment Reason: Patient ill (Comment);Other (Comment) (vasovagal) Vital SignsTherapy Vitals Pulse Rate: (!) 54 BP: (!) 149/67 Pain Pain Assessment Pain Scale: 0-10 Pain Score: 8  Pain Type: Acute pain Pain Location: Leg Pain Orientation: Left Pain Descriptors / Indicators:  Hervey Ard) Pain Intervention(s): Distraction;RN made aware Multiple Pain Sites: No Pain Interference Pain Interference Pain Effect on Sleep: 1. Rarely or not at all Pain Interference with Therapy Activities: 2. Occasionally Pain Interference with Day-to-Day Activities: 2. Occasionally Home Living/Prior Functioning Home Living Available Help at Discharge: Family;Available 24 hours/day Type of Home: House Home Access: Stairs to enter [employer] of Steps: 3 Entrance Stairs-Rails: Can reach both;Left;Right Home Layout: One level Bathroom Shower/Tub: Multimedia programmer: Standard Bathroom Accessibility: Yes Additional Comments: Info pulled through from chart as no family at the bedside to confirm  Lives With: Spouse Prior Function Level of Independence: Independent with gait;Independent with basic ADLs  Able to Take Stairs?: Yes Driving: No Vision/Perception  Vision - History Ability to See in Adequate Light: 0 Adequate Vision - Assessment Eye Alignment: Within Functional Limits Ocular Range  of Motion: Within Functional Limits Alignment/Gaze Preference: Head turned (turned R) Tracking/Visual Pursuits: Able to track stimulus in all quads without difficulty Perception Perception: Impaired Inattention/Neglect: Does not attend to left side of body Praxis Praxis: Impaired Praxis Impairment Details: Motor planning;Perseveration  Cognition Overall Cognitive Status: History of cognitive impairments - at baseline (appears to be impaired and different from baseline. Difficult to determine without family present to confirm) Arousal/Alertness: Awake/alert Orientation Level: Oriented to person;Oriented to place;Disoriented to time;Disoriented to situation (reports being in the hospital because he's "sick") Month: December Attention: Focused;Sustained;Selective Focused Attention: Appears intact Sustained Attention: Appears intact  Selective Attention: Impaired Selective Attention Impairment: Verbal basic;Functional basic Memory: Impaired Memory Impairment: Decreased short term memory;Decreased recall of new information Decreased Short Term Memory: Verbal basic;Functional basic Awareness: Impaired Awareness Impairment: Intellectual impairment;Emergent impairment Problem Solving: Impaired Problem Solving Impairment: Verbal basic;Functional basic Behaviors: Perseveration Safety/Judgment: Appears intact (Will continue to assess in functional context) Sensation Sensation Light Touch: Appears Intact Hot/Cold: Appears Intact Proprioception: Impaired by gross assessment Stereognosis: Not tested Additional Comments: Difficult to determine due to cognitive impairments. Appears intact Coordination Gross Motor Movements are Fluid and Coordinated: No Coordination and Movement Description: L hemi Heel Shin Test: UTA on L due to weakness Motor  Motor Motor: Hemiplegia;Motor apraxia   Trunk/Postural Assessment  Cervical Assessment Cervical Assessment: Within Functional Limits Thoracic  Assessment Thoracic Assessment: Exceptions to [facility] (rounded shoulders) Lumbar Assessment Lumbar Assessment: Exceptions to [facility] Postural Control Postural Control: Deficits on evaluation (posterior bias)  Balance Balance Balance Assessed: Yes Static Sitting Balance Static Sitting - Balance Support: No upper extremity supported;Feet supported Static Sitting - Level of Assistance: 5: Stand by assistance Dynamic Sitting Balance Dynamic Sitting - Balance Support: Feet supported;No upper extremity supported Dynamic Sitting - Level of Assistance: 4: Min Insurance risk surveyor Standing - Balance Support: No upper extremity supported Static Standing - Level of Assistance: 3: Mod assist Dynamic Standing Balance Dynamic Standing - Balance Support: During functional activity;No upper extremity supported Dynamic Standing - Level of Assistance: 2: Max assist Dynamic Standing - Balance Activities: Reaching for objects;Lateral lean/weight shifting;Forward lean/weight shifting Extremity Assessment      RLE Assessment RLE Assessment: Within Functional Limits LLE Assessment LLE Assessment: Exceptions to [facility] LLE Strength LLE Overall Strength: Deficits Left Hip Flexion: 2/5 Left Knee Flexion: 2+/5 Left Knee Extension: 2/5 Left Ankle Dorsiflexion: 3/5  Care Tool Care Tool Bed Mobility Roll left and right activity   Roll left and right assist level: Maximal Assistance - Patient 25 - 49%    Sit to lying activity   Sit to lying assist level: Total Assistance - Patient < 25%    Lying to sitting on side of bed activity   Lying to sitting on side of bed assist level: the ability to move from lying on the back to sitting on the side of the bed with no back support.: Moderate Assistance - Patient 50 - 74%     Care Tool Transfers Sit to stand transfer   Sit to stand assist level: Maximal Assistance - Patient 25 - 49%    Chair/bed transfer   Chair/bed transfer assist level: Maximal  Assistance - Patient 25 - 49%     Psychologist, counselling transfer activity did not occur: Safety/medical concerns        Care Tool Locomotion Ambulation Ambulation activity did not occur: Safety/medical concerns (vasovagal after toileting)        Walk 10 feet activity Walk 10 feet activity did not occur: Safety/medical concerns       Walk 50 feet with 2 turns activity Walk 50 feet with 2 turns activity did not occur: Safety/medical concerns      Walk 150 feet activity Walk 150 feet activity did not occur: Safety/medical concerns      Walk 10 feet on uneven surfaces activity Walk 10 feet on uneven surfaces activity did not occur: Safety/medical concerns      Stairs Stair activity did not occur: Safety/medical concerns        Walk up/down 1 step activity Walk  up/down 1 step or curb (drop down) activity did not occur: Safety/medical concerns      Walk up/down 4 steps activity Walk up/down 4 steps activity did not occur: Safety/medical concerns      Walk up/down 12 steps activity Walk up/down 12 steps activity did not occur: Safety/medical concerns      Pick up small objects from floor Pick up small object from the floor (from standing position) activity did not occur: Safety/medical concerns      Wheelchair Is the patient using a wheelchair?: Yes Type of Wheelchair: Manual   Wheelchair assist level: Dependent - Patient 0%    Wheel 50 feet with 2 turns activity   Assist Level: Dependent - Patient 0%  Wheel 150 feet activity   Assist Level: Dependent - Patient 0%    Refer to Care Plan for Long Term Goals  SHORT TERM GOAL WEEK 1 PT Short Term Goal 1 (Week 1): Pt will complete bed mobility with modA PT Short Term Goal 2 (Week 1): Pt will complete bed<>chair transfers with minA and LRAD PT Short Term Goal 3 (Week 1): Pt will ambulate 74f with minA and LRAD  Recommendations for other services: None   Skilled Therapeutic  Intervention Mobility Bed Mobility Bed Mobility: Rolling Right;Rolling Left;Supine to Sit;Sit to Supine Rolling Right: Maximal Assistance - Patient 25-49% Rolling Left: Moderate Assistance - Patient 50-74% Supine to Sit: Moderate Assistance - Patient 50-74% Sit to Supine: Moderate Assistance - Patient 50-74% Transfers Transfers: Sit to Stand;Stand to Sit;Stand Pivot Transfers;Squat Pivot Transfers Sit to Stand: Moderate Assistance - Patient 50-74% Stand to Sit: Moderate Assistance - Patient 50-74% Stand Pivot Transfers: Moderate Assistance - Patient 50 - 74% Stand Pivot Transfer Details: Verbal cues for technique;Verbal cues for gait pattern;Verbal cues for precautions/safety;Visual cues/gestures for sequencing;Tactile cues for initiation;Tactile cues for weight shifting;Manual facilitation for weight shifting;Manual facilitation for weight bearing Squat Pivot Transfers: Moderate Assistance - Patient 50-74% Transfer (Assistive device): None Locomotion  Gait Ambulation: No Gait Gait: No Stairs / Additional Locomotion Stairs: No Wheelchair Mobility Wheelchair Mobility: No *Pt vasovagal after toileting, unable to assess gait or stairs.   Skilled Intervention: Pt in bed on arrival - in agreement to PT evaluation. Pt alert and oriented x3, disoriented to situation. No family present to confirm PLOF and social factors - gathered from chart. Pt with h/o dementia and is poor historian. Retrieved 18x16 wheelchair and disposable clothes. Donned pants at bed level with totalA for time management. Initiated functional mobility as outlined above. Overall, pt requires mod to maxA for functional mobility.Recommend Stedy +1 for nursing staff to transfer. .Marland KitchenHe presents with L sided weakness, cognitive impairments, motor apraxia, L inattention, poor problem solving, and decreased insight into his deficits. Anticipate patient will require 24/7 care at discharge. Pt continent of bowel and bladder during  session - documented in flowsheets. After returning to wheelchair, patient diaphoretic with decreased alertness. BP taken 90/42 and retaken 83/42. Patient appeared to vasovagal after straining from BM. Returned to bed, required totalA squat<>pivot transfer and totalA for bed mobility due to hypotension. Nursing team in room who was made aware - BP returning to WNL and patient's alertness improving as well. HOB raised, alarm on, call bell in reach at end of session. MD made aware of patient's response via secure chat.   Discharge Criteria: Patient will be discharged from PT if patient refuses treatment 3 consecutive times without medical reason, if treatment goals not met, if there is a change in medical status,  if patient makes no progress towards goals or if patient is discharged from hospital.  The above assessment, treatment plan, treatment alternatives and goals were discussed and mutually agreed upon: by patient  [name] PT, DPT 07/25/2022, 10:05 AM

## 2022-07-25 NOTE — Evaluation (Addendum)
Speech Language Pathology Assessment and Plan  Patient Details  Name: Mitchell Washington MRN: [id number] Date of Birth: July 02, 1951  SLP Diagnosis: Dysarthria  Rehab Potential: Fair ELOS: 10-14 days    Today's Date: 07/25/2022 SLP Individual Time: 0730-0830 SLP Individual Time Calculation (min): 60 min   Hospital Problem: Principal Problem:   Posterior circulation stroke [facility])  Past Medical History:  Past Medical History:  Diagnosis Date   CVA (cerebral infarction)    Diabetes mellitus without complication ([place])    Gout    Hyperlipidemia    Hypertension    Memory loss    Stroke [facility])    Wears dentures    partial upper   Past Surgical History:  Past Surgical History:  Procedure Laterality Date   APPENDECTOMY     CATARACT EXTRACTION W/PHACO Left 10/25/2019   Procedure: CATARACT EXTRACTION PHACO AND INTRAOCULAR LENS PLACEMENT (IOC) LEFT DIABETIC INTRAVEITREAL KENALOG INJECTION 1.93  00:22.6;  Surgeon: Eulogio Bear, MD;  Location: [place];  Service: Ophthalmology;  Laterality: Left;  Diabetic - oral meds   CATARACT EXTRACTION W/PHACO Right 11/15/2019   Procedure: CATARACT EXTRACTION PHACO AND INTRAOCULAR LENS PLACEMENT ([place]) RIGHT DIABETIC;  Surgeon: Eulogio Bear, MD;  Location: [place];  Service: Ophthalmology;  Laterality: Right;  0.92 0 ;19.7   IR ANGIO INTRA EXTRACRAN SEL COM CAROTID INNOMINATE BILAT MOD SED  07/16/2022   IR ANGIO VERTEBRAL SEL SUBCLAVIAN INNOMINATE UNI R MOD SED  07/16/2022   IR ANGIO VERTEBRAL SEL VERTEBRAL UNI L MOD SED  07/16/2022   IR ANGIO VERTEBRAL SEL VERTEBRAL UNI L MOD SED  07/22/2022   IR CT HEAD LTD  07/18/2022   IR PTA INTRACRANIAL  07/18/2022   RADIOLOGY WITH ANESTHESIA N/A 07/18/2022   Procedure: Cerebral angioplasty with possible stenting;  Surgeon: Luanne Bras, MD;  Location: [place];  Service: Radiology;  Laterality: N/A;    Assessment / Plan / Recommendation Clinical Impression CC: CVA with left  sided weakness   HPI: Mitchell Washington is a 71 year old right-handed male with history of prior CVA with no residual deficits, gout, diabetes mellitus, hypertension, hyperlipidemia, memory loss maintained on Namenda as well as Aricept, REM sleep behavior disorder, tremor.  Per chart review patient lives with spouse.  1 level home 3 steps to entry.  Reportedly independent prior to admission.  Presented 07/15/2022 with acute onset of left-sided weakness/dysarthria and gait disturbance.  Initial cranial CT scan negative, MRI of the brain 11/28 remarkable for small CVA right paramedian ventral pons.  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  Patient underwent arteriogram with balloon angioplasty x 2 07/18/2022 per interventional radiology.  Follow-up MRI 07/19/2022 multiple new small acute infarcts within both cerebellar hemispheres, right greater than left. unchanged appearance of right paramedian pontine infarct.  No new intracranial occlusion. Echo with EF 60-65%.  Patient currently maintained on aspirin 81 mg daily and Brilinta 90 mg twice daily.  Subcutaneous heparin added for DVT prophylaxis.  Tolerating a regular consistency diet.  He was started on 5 day course of prednisone for left knee gout flare. Therapy evaluations completed due to patient decreased mobility left-sided weakness was admitted for a comprehensive rehab program.  SLP consulted to complete clinical swallow + motor speech evaluation, and brief cognitive-linguistic screening in the setting of acute PCA CVA. Pt encountered lying semi-reclined in bed, sleeping; aroused to min verbal cues. Agreeable to assessment. Per chart review, pt independent prior to  admission; states his wife handled medication and financial management. States he didn't drive "much." Per SLP evaluation on 07/21/22, pt presented with motor speech deficits, and reports stuttering at baseline. No change  in baseline cognitive-linguistic skills noted.  Per formal and informal assessment measures, pt presents with functional deficits in the areas of motor speech (moderate dysarthria) c/b low vocal intensity, poor pacing/rapid speech rate, and imprecise articulation. Speech intelligibility was further compounded by dysfluencies to include sound repetitions, incoordination, and phonatory breaks, and decreased awareness of how his speech is perceived by communication partner. Pt's speech intelligibility was subjectively judged to be ~75% at the word level with phrase level being judged to be ~50-60% in a quiet environment to an unfamiliar listener. Pt with intermittent response to skilled verbal cues to increase vocal intensity and slow the rate of his speech, though no generalization noted. Pt reports he is a fast talker at baseline; however, does report changes in his speech intelligibility s/p CVA, particularly with vocal intensity. Benefited from reducing environmental stimulus to improve comprehensibility, as well as establishment and maintenance of topic.   CSE revealed functional oral phase for regular solid textures and thin liquids, with no clinical s/sx concerning for aspiration noted. Fed self with set-up assistance and Mod I for adherence  to general aspiration precautions. Of note, pt with ill-fitting upper partial dentures that did not appear to negatively interfere with function; pt declined use of denture adhesive stating that he would not use it at home. Recommend continuation of regular diet and thin liquids; medications whole with water.   Brief cognitive-linguistic screen revealed relatively intact receptive and expressive language skills for basic and functional communication acts and directives. Per chart review, pt with documented memory loss, which was evident (short-term vs long-term recall) during today's evaluation. Only oriented to self, general location, and month. Decreased  intellectual and emergent awareness and problem-solving also noted; pt unable to problem-solving, without cues, on how to incorporate L hand for stabilizing objects while self-feeding. Appeared to demonstrate good safety awareness during PO intake this AM. No caregiver present to provide information on baseline cognitive status; however, given site of lesion, would suspect cognitive changes to be negligible or absent.   Given clinical presentation and discrepancy between PLOF and CLOF, recommend initiation of skilled ST intervention, during this CIR admission, to maximize pt's independence and decrease caregiver burden re: speech intelligibility. Recommendations reviewed with pt who verbalized understanding, and appeared in agreement with proposed ST POC.   Skilled Therapeutic Interventions          CSE and motor speech evaluation completed. Please see above for details.  SLP Assessment  Patient will need skilled Speech Lanaguage Pathology Services during CIR admission    Recommendations  SLP Diet Recommendations: Age appropriate regular solids;Thin Liquid Administration via: Straw;Cup Medication Administration: Whole meds with liquid Supervision: Patient able to self feed Compensations: Slow rate;Small sips/bites Postural Changes and/or Swallow Maneuvers: Seated upright 90 degrees;Upright 30-60 min after meal Oral Care Recommendations: Oral care BID Patient destination: Home Follow up Recommendations: Home Health SLP;Outpatient SLP;24 hour supervision/assistance Equipment Recommended: None recommended by SLP    SLP Frequency 1 to 3 out of 7 days   SLP Duration  SLP Intensity  SLP Treatment/Interventions 10-14 days  Minumum of 1-2 x/day, 30 to 90 minutes  Internal/external aids;Functional tasks;Multimodal communication approach;Patient/family education    Pain Pain Assessment Pain Scale: 0-10 Pain Score: 8  Pain Type: Acute pain Pain Location: Leg Pain Orientation: Left Pain  Descriptors /  Indicators:  Hervey Ard) Pain Intervention(s): Distraction;RN made aware Multiple Pain Sites: No  Prior Functioning Cognitive/Linguistic Baseline: Information not available Type of Home: House  Lives With: Spouse Available Help at Discharge: Family;Available 24 hours/day  SLP Evaluation Cognition Overall Cognitive Status: History of cognitive impairments - at baseline (Difficult to determine without family present to confirm) Arousal/Alertness: Awake/alert Orientation Level: Oriented to person;Oriented to situation Month: December Attention: Focused;Sustained;Selective Focused Attention: Appears intact Sustained Attention: Appears intact Selective Attention: Impaired Selective Attention Impairment: Verbal basic;Functional basic Memory: Impaired Memory Impairment: Decreased short term memory;Decreased recall of new information Decreased Short Term Memory: Verbal basic;Functional basic Awareness: Impaired Awareness Impairment: Intellectual impairment;Emergent impairment Problem Solving: Impaired Problem Solving Impairment: Verbal basic;Functional basic Behaviors: Perseveration Safety/Judgment: Appears intact (Will continue to assess in functional context)  Comprehension Auditory Comprehension Overall Auditory Comprehension: Appears within functional limits for tasks assessed Visual Recognition/Discrimination Discrimination: Not tested Reading Comprehension Reading Status: Not tested Expression Expression Primary Mode of Expression: Verbal Verbal Expression Overall Verbal Expression: Appears within functional limits for tasks assessed Written Expression Dominant Hand: Right Written Expression: Not tested Oral Motor Oral Motor/Sensory Function Overall Oral Motor/Sensory Function: Other (comment) - mild L facial asymmetry Motor Speech Overall Motor Speech: Impaired Respiration: Within functional limits Phonation: Low vocal intensity Resonance: Within  functional limits Articulation: Impaired Level of Impairment: Phrase Intelligibility: Intelligibility reduced Word: 75-100% accurate Phrase: 50-74% accurate Sentence: 25-49% accurate Motor Planning: Impaired Level of Impairment: Phrase  Care Tool Care Tool Cognition Ability to hear (with hearing aid or hearing appliances if normally used Ability to hear (with hearing aid or hearing appliances if normally used): 1. Minimal difficulty - difficulty in some environments (e.g. when person speaks softly or setting is noisy)   Expression of Ideas and Wants Expression of Ideas and Wants: 3. Some difficulty - exhibits some difficulty with expressing needs and ideas (e.g, some words or finishing thoughts) or speech is not clear   Understanding Verbal and Non-Verbal Content Understanding Verbal and Non-Verbal Content: 3. Usually understands - understands most conversations, but misses some part/intent of message. Requires cues at times to understand  Memory/Recall Ability Memory/Recall Ability : Current season;That he or she is in a hospital/hospital unit   Bedside Swallowing Assessment General Previous Swallow Assessment: None on file Diet Prior to this Study: Regular;Thin liquids Temperature Spikes Noted: No Respiratory Status: Room air History of Recent Intubation: No Behavior/Cognition: Alert;Cooperative;Pleasant mood Oral Cavity - Dentition: Poor condition;Dentures, top;Missing dentition Self-Feeding Abilities: Able to feed self;Needs set up Patient Positioning: Upright in bed Baseline Vocal Quality: Low vocal intensity Volitional Cough: Weak Volitional Swallow: Able to elicit  Ice Chips Ice chips: Not tested Thin Liquid Thin Liquid: Within functional limits Presentation: Cup;Straw Nectar Thick Nectar Thick Liquid: Not tested Honey Thick Honey Thick Liquid: Not tested Puree Puree: Within functional limits Presentation: Self Fed;Spoon Solid Solid: Within functional limits BSE  Assessment Suspected Esophageal Findings Suspected Esophageal Findings:  (None observed) Risk for Aspiration Impact on safety and function: No limitations Other Related Risk Factors: Previous CVA;Cognitive impairment  Short Term Goals: Week 1: SLP Short Term Goal 1 (Week 1): Pt will utilize pacing, overarticulation, and increased vocal intensity to improve speech intelligibility to 70% at the phrase level given Mod A multimodal cues. SLP Short Term Goal 2 (Week 1): Pt will participate in EMST assessment to determine candidacy for device with 100% completion. SLP Short Term Goal 3 (Week 1): Pt will verbalize 2 techniques/modifications that would be helpful to aid in speech comprehensibility within the context  of functional communication tasks with Mod A following education. SLP Short Term Goal 4 (Week 1): Pt will demonstrate orientation x 4 with use of external aids and Min A. SLP Short Term Goal 5 (Week 1): Pt will verbalize reason for hospitaliization and 2 deficits s/p CVA with Mod-Max A.  Refer to Care Plan for Long Term Goals  Recommendations for other services: None   Discharge Criteria: Patient will be discharged from SLP if patient refuses treatment 3 consecutive times without medical reason, if treatment goals not met, if there is a change in medical status, if patient makes no progress towards goals or if patient is discharged from hospital.  The above assessment, treatment plan, treatment alternatives and goals were discussed and mutually agreed upon: by patient  Romelle Starcher A Koby Hartfield 07/25/2022, 12:06 PM

## 2022-07-25 NOTE — Plan of Care (Signed)
  Problem: RH Balance Goal: LTG Patient will maintain dynamic standing balance (PT) Description: LTG:  Patient will maintain dynamic standing balance with assistance during mobility activities (PT) Flowsheets (Taken 07/25/2022 1055) LTG: Pt will maintain dynamic standing balance during mobility activities with:: Contact Guard/Touching assist   Problem: Sit to Stand Goal: LTG:  Patient will perform sit to stand with assistance level (PT) Description: LTG:  Patient will perform sit to stand with assistance level (PT) Flowsheets (Taken 07/25/2022 1055) LTG: PT will perform sit to stand in preparation for functional mobility with assistance level: Contact Guard/Touching assist   Problem: RH Bed Mobility Goal: LTG Patient will perform bed mobility with assist (PT) Description: LTG: Patient will perform bed mobility with assistance, with/without cues (PT). Flowsheets (Taken 07/25/2022 1055) LTG: Pt will perform bed mobility with assistance level of: Contact Guard/Touching assist   Problem: RH Bed to Chair Transfers Goal: LTG Patient will perform bed/chair transfers w/assist (PT) Description: LTG: Patient will perform bed to chair transfers with assistance (PT). Flowsheets (Taken 07/25/2022 1055) LTG: Pt will perform Bed to Chair Transfers with assistance level: Contact Guard/Touching assist   Problem: RH Car Transfers Goal: LTG Patient will perform car transfers with assist (PT) Description: LTG: Patient will perform car transfers with assistance (PT). Flowsheets (Taken 07/25/2022 1055) LTG: Pt will perform car transfers with assist:: Minimal Assistance - Patient > 75%   Problem: RH Ambulation Goal: LTG Patient will ambulate in controlled environment (PT) Description: LTG: Patient will ambulate in a controlled environment, # of feet with assistance (PT). Flowsheets (Taken 07/25/2022 1055) LTG: Pt will ambulate in controlled environ  assist needed:: Minimal Assistance - Patient > 75% LTG:  Ambulation distance in controlled environment: 148ft Goal: LTG Patient will ambulate in home environment (PT) Description: LTG: Patient will ambulate in home environment, # of feet with assistance (PT). Flowsheets (Taken 07/25/2022 1055) LTG: Pt will ambulate in home environ  assist needed:: Minimal Assistance - Patient > 75% LTG: Ambulation distance in home environment: 21ft   Problem: RH Stairs Goal: LTG Patient will ambulate up and down stairs w/assist (PT) Description: LTG: Patient will ambulate up and down # of stairs with assistance (PT) Flowsheets (Taken 07/25/2022 1055) LTG: Pt will ambulate up/down stairs assist needed:: Minimal Assistance - Patient > 75% LTG: Pt will  ambulate up and down number of stairs: 4 with 2 hand rails or as per home setup

## 2022-07-25 NOTE — Progress Notes (Signed)
PROGRESS NOTE   Subjective/Complaints: Vasovagal episode this morning after having a BM while working with [place] this morning. Diastolic is 43: will change Coreg to just HS and will change glipizide to with supper  ROS: +vasovagal episode during therapy today Objective:   DG Knee Left Port  Result Date: 07/24/2022 CLINICAL DATA:  Pain.  No injury. EXAM: PORTABLE LEFT KNEE - 1-2 VIEW COMPARISON:  None Available. FINDINGS: No fracture. No dislocation. There are bidirectional enthesopathic changes of the patella. Vascular calcifications are present. No radiopaque foreign body. No effusion. IMPRESSION: Patellofemoral compartment predominant degenerative change. No fracture or dislocation. Electronically Signed   By: Lorenza Cambridge M.D.   On: 07/24/2022 10:33   Recent Labs    07/23/22 0626 07/25/22 0627  WBC 7.3 9.5  HGB 11.9* 12.0*  HCT 35.6* 36.4*  PLT 370 433*   Recent Labs    07/23/22 0626 07/25/22 0627  NA 139 141  K 4.2 4.2  CL 108 109  CO2 23 24  GLUCOSE 175* 105*  BUN 29* 36*  CREATININE 1.56* 1.69*  CALCIUM 9.0 9.3    Intake/Output Summary (Last 24 hours) at 07/25/2022 1539 Last data filed at 07/25/2022 1347 Gross per 24 hour  Intake 600 ml  Output 300 ml  Net 300 ml        Physical Exam: Vital Signs Blood pressure (!) 138/43, pulse 62, temperature 98.1 F (36.7 C), temperature source Oral, resp. rate 18, height 5\' 9"  (1.753 m), weight 77.1 kg, SpO2 99 %. Gen: no distress, normal appearing HEENT: oral mucosa pink and moist, NCAT Cardio: Reg rate Chest: normal effort, normal rate of breathing Abd: soft, non-distended Ext: no edema Psych: pleasant, normal affect Skin: intact Neuro:  Patient is alert.  Oriented to name , , not hospital, able to give  month, not year or date, Makes eye contact with examiner.  Speech is a dysarthric.  Follows simple commands. Able to name 2 items. Decreased  shoulder shrug on Left,appears to have some L inattention,  EOMI, tongue midline,Left facial weakness, PERRL, visual field full Strength RUE and RLE 4+/5 LUE 3/5 shoulder abduction, 4-/5 elbow flexion, 3/5 elbow extension, finger flexion 3/5 LLE 1-2/5 hip flexion and knee extension, 3/5 ankle PF and DF Right FTN intact Sensation to LT altered in LUE and LLE Musculoskeletal: Left knee tenderness, no effusion,  no abnormal tone noted   IV RUE x2   Assessment/Plan: 1. Functional deficits which require 3+ hours per day of interdisciplinary therapy in a comprehensive inpatient rehab setting. Physiatrist is providing close team supervision and 24 hour management of active medical problems listed below. Physiatrist and rehab team continue to assess barriers to discharge/monitor patient progress toward functional and medical goals  Care Tool:  Bathing              Bathing assist       Upper Body Dressing/Undressing Upper body dressing        Upper body assist      Lower Body Dressing/Undressing Lower body dressing            Lower body assist       Toileting Toileting  Toileting assist       Transfers Chair/bed transfer  Transfers assist     Chair/bed transfer assist level: Maximal Assistance - Patient 25 - 49%     Locomotion Ambulation   Ambulation assist   Ambulation activity did not occur: Safety/medical concerns (vasovagal after toileting)          Walk 10 feet activity   Assist  Walk 10 feet activity did not occur: Safety/medical concerns        Walk 50 feet activity   Assist Walk 50 feet with 2 turns activity did not occur: Safety/medical concerns         Walk 150 feet activity   Assist Walk 150 feet activity did not occur: Safety/medical concerns         Walk 10 feet on uneven surface  activity   Assist Walk 10 feet on uneven surfaces activity did not occur: Safety/medical concerns          Wheelchair     Assist Is the patient using a wheelchair?: Yes Type of Wheelchair: Manual    Wheelchair assist level: Dependent - Patient 0%      Wheelchair 50 feet with 2 turns activity    Assist        Assist Level: Dependent - Patient 0%   Wheelchair 150 feet activity     Assist      Assist Level: Dependent - Patient 0%   Blood pressure (!) 138/43, pulse 62, temperature 98.1 F (36.7 C), temperature source Oral, resp. rate 18, height 5\' 9"  (1.753 m), weight 77.1 kg, SpO2 99 %.  Medical Problem List and Plan: 1. Functional deficits secondary to infarct right paramedial ventral pons and multiple small acute infarcts within both cerebellar hemispheres due to severe posterior circulation stenosis status post basilar artery angioplasty 07/18/2022             -patient may shower             -ELOS/Goals:  10-14 days sup to min A with PT and OT, sup with SLP             -Admit to CIR  2.  Antithrombotics: -DVT/anticoagulation:  Pharmaceutical: Heparin             -antiplatelet therapy: Aspirin 81 mg daily and Brilinta 90 mg twice daily. 3. Pain Management: Tylenol as needed 4. Mood/Behavior/Sleep: Aricept 10 mg nightly, Namenda 10 mg twice daily.             -antipsychotic agents: N/A 5. Neuropsych/cognition: This patient is not capable of making decisions on his own behalf. 6. Skin/Wound Care: Routine skin checks 7. Fluids/Electrolytes/Nutrition: Routine in and outs with follow-up chemistries 8.  Hypertension.  Norvasc 10 mg daily, Coreg 3.125 mg twice daily, lisinopril 40 mg daily.  Monitor with increased mobility. BP goal SBP 130-150 9.  Diabetes mellitus with peripheral neuropathy.  Hemoglobin A1c 6.5.  Glucophage 500 mg twice daily, Glucotrol 5 mg daily. Monitor closely as he is on prednisone.Change glipizide to with supper.  10. Hyperlipidemia.  Lipitor 80mg  daily 11. CKD2. Cr uptrending- will place nursing order to encourage 6-8 glasses of water per  day 12. Dementia. Continue Aricept and Namenda  13. Gout L knee. Prednisone 20mg  for 5 days. 14. Vasovagal episode: chang Coreg to 3.125mg  HS  LOS: 1 days A FACE TO FACE EVALUATION WAS PERFORMED  07/20/2022 Mitchell Washington 07/25/2022, 3:39 PM

## 2022-07-25 NOTE — Evaluation (Signed)
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[place])   Past Medical History:  Past Medical History:  Diagnosis Date   CVA (cerebral infarction)    Diabetes mellitus without complication ([place])    Gout    Hyperlipidemia    Hypertension    Memory loss    Stroke [facility])    Wears dentures    partial upper   Past Surgical History:  Past Surgical History:  Procedure Laterality Date   APPENDECTOMY     CATARACT EXTRACTION W/PHACO Left 10/25/2019   Procedure: CATARACT EXTRACTION PHACO AND INTRAOCULAR LENS PLACEMENT ([place]) LEFT DIABETIC INTRAVEITREAL KENALOG INJECTION 1.93  00:22.6;  Surgeon: Eulogio Bear, MD;  Location: [place];  Service: Ophthalmology;  Laterality: Left;  Diabetic - oral meds   CATARACT EXTRACTION W/PHACO Right 11/15/2019   Procedure: CATARACT EXTRACTION PHACO AND INTRAOCULAR LENS PLACEMENT ([place]) RIGHT DIABETIC;  Surgeon: Eulogio Bear, MD;  Location: [place];  Service: Ophthalmology;  Laterality: Right;  0.92 0 ;19.7   IR ANGIO INTRA EXTRACRAN SEL COM CAROTID INNOMINATE BILAT MOD SED  07/16/2022   IR ANGIO VERTEBRAL SEL SUBCLAVIAN INNOMINATE UNI R MOD SED  07/16/2022   IR ANGIO VERTEBRAL SEL VERTEBRAL UNI L MOD SED  07/16/2022   IR ANGIO VERTEBRAL SEL VERTEBRAL UNI L MOD SED  07/22/2022   IR CT HEAD LTD  07/18/2022   IR PTA INTRACRANIAL  07/18/2022   RADIOLOGY WITH ANESTHESIA N/A 07/18/2022   Procedure: Cerebral angioplasty with possible stenting;  Surgeon: Luanne Bras, MD;  Location: [place];  Service: Radiology;  Laterality: N/A;    Assessment & Plan Clinical Impression:  Mitchell Washington is a 71 year old right-handed male with history of prior CVA with no residual deficits, gout, diabetes mellitus, hypertension, hyperlipidemia, memory loss maintained on Namenda as well as Aricept, REM sleep behavior disorder, tremor.  Per chart review patient lives with spouse.  1 level home 3 steps to entry.  Reportedly independent prior to admission.  Presented 07/15/2022 with acute onset of left-sided weakness/dysarthria and gait disturbance.  Initial cranial CT scan negative, MRI of the brain 11/28 remarkable for small CVA right paramedian ventral pons.  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  Patient underwent arteriogram with balloon angioplasty x 2 07/18/2022 per interventional radiology.  Follow-up MRI 07/19/2022 multiple new small acute infarcts within both cerebellar hemispheres, right greater than left. unchanged appearance of right paramedian pontine infarct.  No new intracranial occlusion. Patient transferred to CIR on 07/24/2022 .    Patient currently requires max with basic self-care skills secondary to muscle weakness, impaired timing and sequencing, abnormal tone, unbalanced muscle activation, motor apraxia, decreased coordination, and decreased motor planning, decreased attention to left, decreased motor planning, and ideational apraxia, decreased initiation, decreased problem solving, decreased memory, and delayed processing, and decreased sitting balance, decreased standing balance, hemiplegia, and decreased balance strategies.  Prior to hospitalization, patient could complete BADL tasks with modified independent .  Patient will benefit from skilled intervention to decrease level of assist with basic self-care skills  prior to discharge home with care partner.  Anticipate patient will  require 24 hour supervision and follow up home health.  OT - End of Session Activity Tolerance: Decreased this session Endurance Deficit: Yes Endurance Deficit Description: multiple rest breaks needed OT Assessment Rehab Potential (ACUTE ONLY): Excellent OT Patient demonstrates impairments in the following area(s): Balance;Safety;Sensory;Endurance;Motor OT Basic ADL's Functional Problem(s): Grooming;Eating;Bathing;Dressing;Toileting OT Transfers Functional Problem(s): Tub/Shower;Toilet OT Additional Impairment(s): Fuctional Use of Upper Extremity OT Plan OT Intensity: Minimum of 1-2 x/day, 45 to 90 minutes OT Frequency: 5 out of 7 days OT Duration/Estimated Length of Stay: 2 weeks OT Treatment/Interventions: Balance/vestibular training;Discharge planning;Functional electrical stimulation;Pain management;Self Care/advanced ADL retraining;Therapeutic Activities;UE/LE Coordination activities;Functional mobility training;Patient/family education;Therapeutic Exercise;Community reintegration;DME/adaptive equipment instruction;Neuromuscular re-education;Splinting/orthotics;UE/LE Strength taining/ROM;Wheelchair propulsion/positioning OT Basic Self-Care Anticipated Outcome(s): Min A OT Toileting Anticipated Outcome(s): Min A OT Bathroom Transfers Anticipated Outcome(s): Min A OT Recommendation Patient destination: Home Follow Up Recommendations: Home health OT Equipment Recommended: 3 in 1 bedside comode;Tub/shower bench   OT Evaluation Precautions/Restrictions  Precautions Precautions: Fall Precaution Comments: L hemi, dementia Restrictions Weight Bearing Restrictions: No General   Vital Signs Therapy Vitals Temp: 98.1 F (36.7 C) Temp Source: Oral Pulse Rate: 62 Resp: 18 BP: (!) 138/43 Patient Position (if appropriate): Sitting Oxygen Therapy SpO2: 99 % O2 Device: Room Air Pain Pain Assessment Pain Scale: 0-10 Pain Score: 0-No pain Home Living/Prior Functioning Home  Living Family/patient expects to be discharged to:: Private residence Living Arrangements: Spouse/significant other Available Help at Discharge: Family, Available 24 hours/day Type of Home: House Home Access: Stairs to enter [employer] of Steps: 3 Entrance Stairs-Rails: Can reach both, Left, Right Home Layout: One level Bathroom Shower/Tub: Multimedia programmer: Standard Bathroom Accessibility: Yes Additional Comments: Pt reports that he has a shower chair no back.  Lives With: Spouse IADL History Occupation: Retired Tax adviser: enjoys Production manager Prior Function Level of Independence: Independent with gait, Independent with basic ADLs  Able to Take Stairs?: Yes Driving: No Vision Baseline Vision/History: 1 Wears glasses (sometimes needs them for reading.) Ability to See in Adequate Light: 0 Adequate Patient Visual Report: Other (comment) (Pt reports that his vision is different although unable to report what is different. Denies blurry vision, double, etc.) Vision Assessment?: Yes Eye Alignment: Within Functional Limits Ocular Range of Motion: Within Functional Limits Tracking/Visual Pursuits: Able to track stimulus in all quads without difficulty Convergence: Within functional limits Visual Fields: No apparent deficits Perception  Perception: Impaired Inattention/Neglect: Other (comment) (decreased attention to left side of body) Praxis Praxis: Impaired Praxis Impairment Details: Motor planning;Perseveration Praxis-Other Comments: Decreased thumb opposition to each finger Cognition Cognition Overall Cognitive Status: History of cognitive impairments - at baseline Arousal/Alertness: Awake/alert Orientation Level: Person;Place Memory: Impaired Memory Impairment: Decreased short term memory;Decreased recall of new information Decreased Short Term Memory: Verbal basic;Functional basic Attention: Focused;Sustained;Selective Focused Attention:  Appears intact Sustained Attention: Appears intact Selective Attention: Impaired Selective Attention Impairment: Verbal basic;Functional basic Awareness: Impaired Awareness Impairment: Intellectual impairment;Emergent impairment Problem Solving: Impaired Problem Solving Impairment: Verbal basic;Functional basic Behaviors: Perseveration Safety/Judgment: Appears intact (Will continue to assess in functional context) Brief Interview for Mental Status (BIMS) Repetition of Three Words (First Attempt): 3 Temporal Orientation: Year: Missed by 1 year Temporal Orientation: Month: Accurate within 5 days Temporal Orientation: Day: Correct Recall: "Sock": No, could not recall Recall: "Blue": No, could not recall Recall: "Bed": No, could not recall BIMS Summary Score: 8 Sensation Sensation Light Touch: Appears Intact Hot/Cold: Appears Intact Proprioception: Impaired by gross assessment Stereognosis:  Impaired by gross assessment Additional Comments: Pt reports decreased sensation in left hand only. Coordination Gross Motor Movements are Fluid and Coordinated: No Fine Motor Movements are Fluid and Coordinated: No Coordination and Movement Description: left hemi Finger Nose Finger Test: Slow and delayed movement 9 Hole Peg Test: NT Motor  Motor Motor: Hemiplegia;Motor apraxia  Trunk/Postural Assessment  Cervical Assessment Cervical Assessment: Within Functional Limits Thoracic Assessment Thoracic Assessment: Exceptions to [facility] (rounded shoulders) Lumbar Assessment Lumbar Assessment: Exceptions to [facility] (posterior pelvic tilt) Postural Control Postural Control: Deficits on evaluation  Balance Balance Balance Assessed: Yes Static Sitting Balance Static Sitting - Balance Support: No upper extremity supported;Feet supported Static Sitting - Level of Assistance: 5: Stand by assistance Static Standing Balance Static Standing - Balance Support: Bilateral upper extremity supported Static  Standing - Level of Assistance: 4: Min assist Extremity/Trunk Assessment RUE Assessment RUE Assessment: Within Functional Limits LUE Assessment LUE Assessment: Exceptions to [facility] Passive Range of Motion (PROM) Comments: Functional P/ROM shoulder, elbow, wrist, hand in all ranges. Active Range of Motion (AROM) Comments: shoulder flexion: 50% range; IR/er: 50% range, functional elbow flexion/extension, functional wrist flexion/extension, full gross fist and full finger extension General Strength Comments: Shoulder flexion: 3-/5, IR/er: 3-/5, elbow flexion/extension: 3+/5, wrist flexion/extension: 3+/5, weak gross grasp.  Care Tool Care Tool Self Care Eating   Eating Assist Level: Set up assist    Oral Care    Oral Care Assist Level: Set up assist    Bathing   Body parts bathed by patient: Left arm;Chest;Abdomen;Front perineal area;Face Body parts bathed by helper: Right arm;Buttocks;Right upper leg;Left upper leg;Right lower leg;Left lower leg   Assist Level: Moderate Assistance - Patient 50 - 74%    Upper Body Dressing(including orthotics)   What is the patient wearing?: Pull over shirt   Assist Level: Moderate Assistance - Patient 50 - 74%    Lower Body Dressing (excluding footwear)   What is the patient wearing?: Incontinence brief;Pants Assist for lower body dressing: Maximal Assistance - Patient 25 - 49%    Putting on/Taking off footwear   What is the patient wearing?: Ted hose;Non-skid slipper socks Assist for footwear: Dependent - Patient 0%       Care Tool Toileting Toileting activity   Assist for toileting: Total Assistance - Patient < 25%     Care Tool Bed Mobility       Lying to sitting on side of bed activity   Lying to sitting on side of bed assist level: the ability to move from lying on the back to sitting on the side of the bed with no back support.: Moderate Assistance - Patient 50 - 74%     Care Tool Transfers Sit to stand transfer   Sit to stand  assist level: Maximal Assistance - Patient 25 - 49%    Chair/bed transfer   Chair/bed transfer assist level: Maximal Assistance - Patient 25 - 49%     Toilet transfer   Assist Level: Maximal Assistance - Patient 24 - 49%     Care Tool Cognition  Expression of Ideas and Wants Expression of Ideas and Wants: 3. Some difficulty - exhibits some difficulty with expressing needs and ideas (e.g, some words or finishing thoughts) or speech is not clear  Understanding Verbal and Non-Verbal Content Understanding Verbal and Non-Verbal Content: 3. Usually understands - understands most conversations, but misses some part/intent of message. Requires cues at times to understand   Memory/Recall Ability Memory/Recall Ability : That he or she is in  a hospital/hospital unit   Refer to Care Plan for Long Term Goals  SHORT TERM GOAL WEEK 1 OT Short Term Goal 1 (Week 1): Pt will progress bed mobility to Min A in order to transition to sitting on EOB to progress OOB activity for morning ADL. OT Short Term Goal 2 (Week 1): Pt will increase LB bathing to Mod A while utilizing AE as needed. OT Short Term Goal 3 (Week 1): Pt will increase LB dressing to Mod A while utilizing hemi dressing techniques. OT Short Term Goal 4 (Week 1): Pt will complete toilet transfer with Mod A.  Recommendations for other services: None    Skilled Therapeutic Intervention ADL ADL Eating: Set up Where Assessed-Eating: Bed level Grooming: Setup Where Assessed-Grooming: Sitting at sink;Wheelchair Upper Body Bathing: Moderate assistance Where Assessed-Upper Body Bathing: Sitting at sink;Wheelchair Lower Body Bathing: Maximal assistance Where Assessed-Lower Body Bathing: Chair;Sitting at sink Upper Body Dressing: Minimal assistance Where Assessed-Upper Body Dressing: Sitting at sink;Wheelchair Lower Body Dressing: Maximal assistance Where Assessed-Lower Body Dressing: Sitting at sink;Wheelchair Toileting: Dependent Where  Assessed-Toileting: Glass blower/designer: Maximal Print production planner Method: Arts development officer: Grab bars;Bedside commode Tub/Shower Transfer: Not assessed Social research officer, government: Not assessed Mobility  Bed Mobility Bed Mobility: Supine to Sit Supine to Sit: Minimal Assistance - Patient > 75% Transfers Sit to Stand: Moderate Assistance - Patient 50-74% Stand to Sit: Moderate Assistance - Patient 50-74%   Discharge Criteria: Patient will be discharged from OT if patient refuses treatment 3 consecutive times without medical reason, if treatment goals not met, if there is a change in medical status, if patient makes no progress towards goals or if patient is discharged from hospital.  The above assessment, treatment plan, treatment alternatives and goals were discussed and mutually agreed upon: by patient  Ailene Ravel, OTR/L,CBIS  Supplemental OT - [place] and [facility]  07/25/2022, 3:55 PM

## 2022-07-25 NOTE — Progress Notes (Signed)
Inpatient Rehabilitation Care Coordinator Assessment and Plan Patient Details  Name: Mitchell Washington MRN: [id number] Date of Birth: [birth year]  Today's Date: 07/25/2022  Hospital Problems: Principal Problem:   Posterior circulation stroke [facility])  Past Medical History:  Past Medical History:  Diagnosis Date   CVA (cerebral infarction)    Diabetes mellitus without complication ([facility])    Gout    Hyperlipidemia    Hypertension    Memory loss    Stroke [facility])    Wears dentures    partial upper   Past Surgical History:  Past Surgical History:  Procedure Laterality Date   APPENDECTOMY     CATARACT EXTRACTION W/PHACO Left 10/25/2019   Procedure: CATARACT EXTRACTION PHACO AND INTRAOCULAR LENS PLACEMENT ([facility]) LEFT DIABETIC INTRAVEITREAL KENALOG INJECTION 1.93  00:22.6;  Surgeon: Nevada Crane, MD;  Location: [facility];  Service: Ophthalmology;  Laterality: Left;  Diabetic - oral meds   CATARACT EXTRACTION W/PHACO Right 11/15/2019   Procedure: CATARACT EXTRACTION PHACO AND INTRAOCULAR LENS PLACEMENT ([facility]) RIGHT DIABETIC;  Surgeon: Nevada Crane, MD;  Location: [facility];  Service: Ophthalmology;  Laterality: Right;  0.92 0 ;19.7   IR ANGIO INTRA EXTRACRAN SEL COM CAROTID INNOMINATE BILAT MOD SED  07/16/2022   IR ANGIO VERTEBRAL SEL SUBCLAVIAN INNOMINATE UNI R MOD SED  07/16/2022   IR ANGIO VERTEBRAL SEL VERTEBRAL UNI L MOD SED  07/16/2022   IR ANGIO VERTEBRAL SEL VERTEBRAL UNI L MOD SED  07/22/2022   IR CT HEAD LTD  07/18/2022   IR PTA INTRACRANIAL  07/18/2022   RADIOLOGY WITH ANESTHESIA N/A 07/18/2022   Procedure: Cerebral angioplasty with possible stenting;  Surgeon: Julieanne Cotton, MD;  Location: [facility] OR;  Service: Radiology;  Laterality: N/A;   Social History:  reports that he has quit smoking. He has never used smokeless tobacco. He reports that he does not currently use alcohol. He reports that he does not use drugs.  Family / Support Systems Marital  Status: Married Patient Roles: Spouse, Parent Spouse/Significant Other: Pattricia Boss [phone] Other Supports: Chruch members and friends Anticipated Caregiver: wife Ability/Limitations of Caregiver: wife is in good health and can assist Caregiver Availability: 24/7 Family Dynamics: Close with family and extended family. Pt was able to ambulate and wife is hopeful he will be able to again. She is used to his memory issues  Social History Preferred language: English Religion: Unknown Cultural Background: No issues Education: Systems developer - How often do you need to have someone help you when you read instructions, pamphlets, or other written material from your doctor or pharmacy?: Patient unable to respond Writes: Yes Employment Status: Retired Marine scientist Issues: No issues Guardian/Conservator: none-according to  MD pt is not fully capable of making his own deicsions due to his dementia. Will look toward his wife for any decisions needing to be made while here.   Abuse/Neglect Abuse/Neglect Assessment Can Be Completed: Yes Physical Abuse: Denies Verbal Abuse: Denies Sexual Abuse: Denies Exploitation of patient/patient's resources: Denies Self-Neglect: Denies  Patient response to: Social Isolation - How often do you feel lonely or isolated from those around you?: Never  Emotional Status Pt's affect, behavior and adjustment status: Pt is somewhat confused regarding therapies this am and if they went well. He looks lost and will talk with wife when here she is on her way.  Wife reports he was able to do own ADL's and was mobile prior to admission Recent Psychosocial Issues: other health issues-hx CVA's and dementia Psychiatric  History: no issues Substance Abuse History: No issues  Patient / Family Perceptions, Expectations & Goals Pt/Family understanding of illness & functional limitations: Wife can explain his stroke and deficits he has had other CVA's in  the past but his memory was what was affected he was still able to be mobile and do for himself. it seems like now he will need physical assist. She will be here daily and observe him in therapies and stay updated on medical issues. Premorbid pt/family roles/activities: husband, father, church member, friend, etc Anticipated changes in roles/activities/participation: resume Pt/family expectations/goals: Pt states: " I want to get home."  Wife states: " I need him to improve and hopefully be mobile again I can't lift him."  [employer]: None Premorbid Home Care/DME Agencies: Other (Comment) (cane and tub seat) Transportation available at discharge: wife Is the patient able to respond to transportation needs?: Yes In the past 12 months, has lack of transportation kept you from medical appointments or from getting medications?: No In the past 12 months, has lack of transportation kept you from meetings, work, or from getting things needed for daily living?: No  Discharge Planning Living Arrangements: Spouse/significant other Support Systems: Spouse/significant other, Children, Friends/neighbors, Church/faith community Type of Residence: Private residence Insurance Resources: Media planner (specify) (Paramedic) Financial Resources: Social Security, Family Support Financial Screen Referred: No Living Expenses: Own Money Management: Spouse Does the patient have any problems obtaining your medications?: No Home Management: wife Patient/Family Preliminary Plans: Return home with wife who will be his primary caregiver, their daughter's can assist intermittently but work and have families. Aware being evaluated today and goals being set for this stay. Care Coordinator Barriers to Discharge: Other (comments) Care Coordinator Barriers to Discharge Comments: dementia and stm issues from previous CVA's Care Coordinator Anticipated Follow Up Needs: HH/OP  Clinical  Impression Pleasant gentleman but confused he seems to be lost but may take a few days to adjust to the new unit. His wife is very involved and will be assisting with his care at discharge. Will await therapy goals and work on safe discharge plan.  [name] 07/25/2022, 1:29 PM

## 2022-07-26 DIAGNOSIS — I6350 Cerebral infarction due to unspecified occlusion or stenosis of unspecified cerebral artery: Secondary | ICD-10-CM | POA: Diagnosis not present

## 2022-07-26 LAB — GLUCOSE, CAPILLARY
Glucose-Capillary: 63 mg/dL — ABNORMAL LOW (ref 70–99)
Glucose-Capillary: 126 mg/dL — ABNORMAL HIGH (ref 70–99)
Glucose-Capillary: 183 mg/dL — ABNORMAL HIGH (ref 70–99)
Glucose-Capillary: 237 mg/dL — ABNORMAL HIGH (ref 70–99)
Glucose-Capillary: 249 mg/dL — ABNORMAL HIGH (ref 70–99)

## 2022-07-26 LAB — HOMOCYSTEINE: Homocysteine: 12.2 umol/L (ref 0.0–17.2)

## 2022-07-26 MED ORDER — HYDROCERIN EX CREA
TOPICAL_CREAM | Freq: Two times a day (BID) | CUTANEOUS | Status: DC
  Administered 2022-07-28: 1 via TOPICAL
  Filled 2022-07-26: qty 113

## 2022-07-26 MED ORDER — GLIPIZIDE ER 2.5 MG PO TB24
2.5000 mg | ORAL_TABLET | Freq: Every day | ORAL | Status: DC
  Administered 2022-07-26 – 2022-08-02 (×8): 2.5 mg via ORAL
  Filled 2022-07-26 (×10): qty 1

## 2022-07-26 NOTE — Plan of Care (Signed)
  Problem: Consults Goal: RH STROKE PATIENT EDUCATION Description: See Patient Education module for education specifics  Outcome: Progressing   Problem: RH BOWEL ELIMINATION Goal: RH STG MANAGE BOWEL WITH ASSISTANCE Description: STG Manage Bowel with min Assistance. Outcome: Progressing Goal: RH STG MANAGE BOWEL W/MEDICATION W/ASSISTANCE Description: STG Manage Bowel with Medication with min Assistance. Outcome: Progressing   Problem: RH BLADDER ELIMINATION Goal: RH STG MANAGE BLADDER WITH ASSISTANCE Description: STG Manage Bladder With min Assistance Outcome: Progressing   Problem: RH SKIN INTEGRITY Goal: RH STG SKIN FREE OF INFECTION/BREAKDOWN Description: Skin will be free of infection/breakdown with min assist Outcome: Progressing Goal: RH STG ABLE TO PERFORM INCISION/WOUND CARE W/ASSISTANCE Description: STG Able To Perform Incision/Wound Care With min Assistance. Outcome: Progressing   Problem: RH SAFETY Goal: RH STG ADHERE TO SAFETY PRECAUTIONS W/ASSISTANCE/DEVICE Description: STG Adhere to Safety Precautions With cueing Assistance/Device. Outcome: Progressing   Problem: RH COGNITION-NURSING Goal: RH STG USES MEMORY AIDS/STRATEGIES W/ASSIST TO PROBLEM SOLVE Description: STG Uses Memory Aids/Strategies With min Assistance to Problem Solve. Outcome: Progressing   Problem: RH PAIN MANAGEMENT Goal: RH STG PAIN MANAGED AT OR BELOW PT'S PAIN GOAL Description: Pain will be managed at 4 out of 10 on pain scale with prn medications min assist Outcome: Progressing   Problem: RH KNOWLEDGE DEFICIT Goal: RH STG INCREASE KNOWLEDGE OF HYPERTENSION Description: Patient/caregiver will be able to managed HTN medications from nursing education and nursing handouts independently  Outcome: Progressing Goal: RH STG INCREASE KNOWLEDGE OF STROKE PROPHYLAXIS Description: Patient/caregiver will be able to manage mediations related to stroke prophylaxis from nursing education and nursing  handouts independently  Outcome: Progressing   Problem: Education: Goal: Understanding of CV disease, CV risk reduction, and recovery process will improve Outcome: Progressing Goal: Individualized Educational Video(s) Outcome: Progressing   Problem: Activity: Goal: Ability to return to baseline activity level will improve Outcome: Progressing   Problem: Cardiovascular: Goal: Ability to achieve and maintain adequate cardiovascular perfusion will improve Outcome: Progressing Goal: Vascular access site(s) Level 0-1 will be maintained Outcome: Progressing   Problem: Health Behavior/Discharge Planning: Goal: Ability to safely manage health-related needs after discharge will improve Outcome: Progressing   Problem: Education: Goal: Knowledge of disease or condition will improve Outcome: Progressing Goal: Knowledge of secondary prevention will improve (MUST DOCUMENT ALL) Outcome: Progressing Goal: Knowledge of patient specific risk factors will improve (Mark N/A or DELETE if not current risk factor) Outcome: Progressing   Problem: Ischemic Stroke/TIA Tissue Perfusion: Goal: Complications of ischemic stroke/TIA will be minimized Outcome: Progressing   Problem: Coping: Goal: Will verbalize positive feelings about self Outcome: Progressing Goal: Will identify appropriate support needs Outcome: Progressing   Problem: Health Behavior/Discharge Planning: Goal: Ability to manage health-related needs will improve Outcome: Progressing Goal: Goals will be collaboratively established with patient/family Outcome: Progressing   

## 2022-07-26 NOTE — Progress Notes (Signed)
Speech Language Pathology Daily Session Note  Patient Details  Name: ANIEL HUBBLE MRN: [id number] Date of Birth: 1951/08/04  Today's Date: 07/26/2022 SLP Individual Time: 7622-6333 SLP Individual Time Calculation (min): 50 min  Short Term Goals: Week 1: SLP Short Term Goal 1 (Week 1): Pt will utilize pacing, overarticulation, and increased vocal intensity to improve speech intelligibility to 70% at the phrase level given Mod A multimodal cues. SLP Short Term Goal 2 (Week 1): Pt will participate in EMST assessment to determine candidacy for device with 100% completion. SLP Short Term Goal 3 (Week 1): Pt will verbalize 2 techniques/modifications that would be helpful to aid in speech comprehensibility within the context of functional communication tasks with Mod A following education. SLP Short Term Goal 4 (Week 1): Pt will demonstrate orientation x 4 with use of external aids and Min A. SLP Short Term Goal 5 (Week 1): Pt will verbalize reason for hospitaliization and 2 deficits s/p CVA with Mod-Max A.  Skilled Therapeutic Interventions: Pt seen this date for skilled ST intervention targeting speech intelligibility and cognitive goals outlined above. Pt encountered lying semi-reclined in bed, awake/alert; wife present. Agreeable to intervention. RN present providing medication. Upon inquiry, pt reporting 5 out of 10 pain in L leg; RN notified and provided medication.   SLP provided extensive skilled education re: speech intelligibility strategies, ST POC, and reason for hospitalization (CVA); education was provided via simplified language with visual aids to assist in comprehension and recall particularly for region of the brain impacted. SLP utilized audio recording to improve awareness of vocal quality and speech intelligibility, which was subjectively noted to be ~65-70% on probe. Improved to ~75-80% given Mod-Max A faded to Sup-Min A verbal cues and following listening to vocal quality on  audio recording. Oriented to date with Mod A and use of external aids; Min A for location and situation. Max A for recall of compensatory strategies in the setting of dementia. Somewhat benefited from persistent verbal cues to speak "clear, loud, and slow" with written reminder/cues posted in room. Pt verbalized 2 deficits with Max A verbal cues.   Pt left in bed with all safety measures activated and wife at bedside. Call bell within reach and reviewed. Continue per current ST POC next session.  Pain At onset of today's session, pt reports 5 out of 10 pain in L leg; distraction and RN notified.  Therapy/Group: Individual Therapy  Jaqulyn Chancellor A Lula Michaux 07/26/2022, 12:05 PM

## 2022-07-26 NOTE — Progress Notes (Signed)
Occupational Therapy Session Note  Patient Details  Name: Mitchell Washington MRN: [id number] Date of Birth: 02-13-1951  Today's Date: 07/26/2022 OT Individual Time: 0905-1000 OT Individual Time Calculation (min): 55 min    Short Term Goals: Week 1:  OT Short Term Goal 1 (Week 1): Pt will progress bed mobility to Min A in order to transition to sitting on EOB to progress OOB activity for morning ADL. OT Short Term Goal 2 (Week 1): Pt will increase LB bathing to Mod A while utilizing AE as needed. OT Short Term Goal 3 (Week 1): Pt will increase LB dressing to Mod A while utilizing hemi dressing techniques. OT Short Term Goal 4 (Week 1): Pt will complete toilet transfer with Mod A.  Skilled Therapeutic Interventions/Progress Updates:  Skilled OT intervention completed with focus on functional transfers, toileting needs. Pt received upright in bed, agreeable to session. Unrated pain reported in L knee, pre-medicated. Therapist offered rest breaks and repositioning throughout for pain reduction.  Transitioned to EOB with mod A trunk elevation and then mod-max scooting to EOB. Able to maintain sitting balance with BLE supported with initial min A fading to supervision. Squat pivot with max A and max positioning of BLE and verbal cues during transfer to w/c.   Once seated in w/c pt reported urge to use bathroom. Utilized stedy with heavy mod A needed for powering up and min cues for use of grab bar. Dependent transfer to [facility] over toilet, then Min A sit > stand from perched position. Extended time needed for BM, with cues needed to avoid straining for prevention of vasovagal response. Large loose continent BM. Heavy mod A stand in stedy, then +2 utilized for CGA/min A standing balance while 2nd person assisted with total A pericare. Donned brief and pants over hips with total A. Dependent transfer to w/c.  Pt remained seated in w/c, with wife present therefore belt alarm left off. All needs in reach at  end of session.  BP during session Upright in bed- 139/53 Sitting EOB- 150/63 After returning to w/c after toileting- 126/59  Therapy Documentation Precautions:  Precautions Precautions: Fall Precaution Comments: L hemi, dementia Restrictions Weight Bearing Restrictions: No    Therapy/Group: Individual Therapy  Melvyn Novas, MS, OTR/L  07/26/2022, 10:04 AM

## 2022-07-26 NOTE — Progress Notes (Signed)
PROGRESS NOTE   Subjective/Complaints: No more vasovagal episode this morning but diastolic BP still soft- discussed with wife and patient d/cing Coreg for now Discussed fluctuating blood sugars  ROS: +vasovagal episode during therapy yesterday, history limited due to patient's dementia Objective:   No results found. Recent Labs    07/25/22 0627  WBC 9.5  HGB 12.0*  HCT 36.4*  PLT 433*   Recent Labs    07/25/22 0627  NA 141  K 4.2  CL 109  CO2 24  GLUCOSE 105*  BUN 36*  CREATININE 1.69*  CALCIUM 9.3    Intake/Output Summary (Last 24 hours) at 07/26/2022 1123 Last data filed at 07/26/2022 0802 Gross per 24 hour  Intake 840 ml  Output --  Net 840 ml        Physical Exam: Vital Signs Blood pressure (!) 125/53, pulse (!) 59, temperature 98.1 F (36.7 C), temperature source Oral, resp. rate 16, height 5\' 9"  (1.753 m), weight 77.1 kg, SpO2 98 %. Gen: no distress, normal appearing HEENT: oral mucosa pink and moist, NCAT Cardio: Bradycardia Chest: normal effort, normal rate of breathing Abd: soft, non-distended Ext: no edema Psych: pleasant, normal affect Skin: intact Neuro:  Patient is alert.  Oriented to name , Mitchell Washington, not hospital, able to give  month, not year or date, Makes eye contact with examiner.  Speech is a dysarthric.  Follows simple commands. Able to name 2 items. Decreased shoulder shrug on Left,appears to have some L inattention,  EOMI, tongue midline,Left facial weakness, PERRL, visual field full Strength RUE and RLE 4+/5 LUE 3/5 shoulder abduction, 4-/5 elbow flexion, 3/5 elbow extension, finger flexion 3/5 LLE 1-2/5 hip flexion and knee extension, 3/5 ankle PF and DF Right FTN intact Sensation to LT altered in LUE and LLE Musculoskeletal: Left knee tenderness, no effusion,  no abnormal tone noted   IV RUE x2   Assessment/Plan: 1. Functional deficits which require 3+ hours per day of  interdisciplinary therapy in a comprehensive inpatient rehab setting. Physiatrist is providing close team supervision and 24 hour management of active medical problems listed below. Physiatrist and rehab team continue to assess barriers to discharge/monitor patient progress toward functional and medical goals  Care Tool:  Bathing    Body parts bathed by patient: Left arm, Chest, Abdomen, Front perineal area, Face   Body parts bathed by helper: Right arm, Buttocks, Right upper leg, Left upper leg, Right lower leg, Left lower leg     Bathing assist Assist Level: Moderate Assistance - Patient 50 - 74%     Upper Body Dressing/Undressing Upper body dressing   What is the patient wearing?: Pull over shirt    Upper body assist Assist Level: Moderate Assistance - Patient 50 - 74%    Lower Body Dressing/Undressing Lower body dressing      What is the patient wearing?: Incontinence brief, Pants     Lower body assist Assist for lower body dressing: Maximal Assistance - Patient 25 - 49%     Toileting Toileting    Toileting assist Assist for toileting: Total Assistance - Patient < 25%     Transfers Chair/bed transfer  Transfers assist  Chair/bed transfer assist level: Moderate Assistance - Patient 50 - 74%     Locomotion Ambulation   Ambulation assist   Ambulation activity did not occur: Safety/medical concerns (vasovagal after toileting)  Assist level: 2 helpers Assistive device: Other (comment) (R hand rail) Max distance: 42ft   Walk 10 feet activity   Assist  Walk 10 feet activity did not occur: Safety/medical concerns  Assist level: 2 helpers     Walk 50 feet activity   Assist Walk 50 feet with 2 turns activity did not occur: Safety/medical concerns         Walk 150 feet activity   Assist Walk 150 feet activity did not occur: Safety/medical concerns         Walk 10 feet on uneven surface  activity   Assist Walk 10 feet on uneven  surfaces activity did not occur: Safety/medical concerns         Wheelchair     Assist Is the patient using a wheelchair?: Yes Type of Wheelchair: Manual    Wheelchair assist level: Dependent - Patient 0%      Wheelchair 50 feet with 2 turns activity    Assist        Assist Level: Dependent - Patient 0%   Wheelchair 150 feet activity     Assist      Assist Level: Dependent - Patient 0%   Blood pressure (!) 125/53, pulse (!) 59, temperature 98.1 F (36.7 C), temperature source Oral, resp. rate 16, height 5\' 9"  (1.753 m), weight 77.1 kg, SpO2 98 %.  Medical Problem List and Plan: 1. 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  Antithrombotics: -DVT/anticoagulation:  Pharmaceutical: Heparin             -antiplatelet therapy: Aspirin 81 mg daily and Brilinta 90 mg twice daily. 3. Pain Management: Tylenol as needed 4. Mood/Behavior/Sleep: Aricept 10 mg nightly, Namenda 10 mg twice daily.             -antipsychotic agents: N/A 5. Neuropsych/cognition: This patient is not capable of making decisions on his own behalf. 6. Skin/Wound Care: Routine skin checks 7. Fluids/Electrolytes/Nutrition: Routine in and outs with follow-up chemistries 8.  History of Hypertension with current hypotensive and hypertensive readings:  continue Norvasc 10 mg daily, d/c Coreg, continue lisinopril 40 mg daily.  Monitor with increased mobility. BP goal SBP 130-150 9.  Diabetes mellitus with peripheral neuropathy.  Hemoglobin A1c 6.5.  Glucophage 500 mg twice daily, Glucotrol 5 mg daily. Monitor closely as he is on prednisone.Change glipizide to with supper.  10.  Hyperlipidemia.  Lipitor 80mg  daily 11. CKD2. Cr uptrending- will place nursing order to encourage 6-8 glasses of water per day 12. Dementia. Continue Aricept and Namenda  13. Gout L knee. Prednisone 20mg  for 5 days. 14. Vasovagal episode: resolved with discontinuation of Coreg 15. Bradycardia: d/c Coreg  LOS: 2 days A FACE TO FACE EVALUATION WAS PERFORMED  07/20/2022 P Mikalia Fessel 07/26/2022, 11:23 AM

## 2022-07-26 NOTE — Progress Notes (Signed)
Physical Therapy Session Note  Patient Details  Name: Mitchell Washington MRN: [id number] Date of Birth: January 23, 1951  Today's Date: 07/26/2022 PT Individual Time: 1015-1059 + 6468-0321 PT Individual Time Calculation (min): 44 min  + 72 min  Short Term Goals: Week 1:  PT Short Term Goal 1 (Week 1): Pt will complete bed mobility with modA PT Short Term Goal 2 (Week 1): Pt will complete bed<>chair transfers with minA and LRAD PT Short Term Goal 3 (Week 1): Pt will ambulate 4f with minA and LRAD  Skilled Therapeutic Interventions/Progress Updates:     1st session: Pt sitting in w/c on arrival with his wife at bedside. Confirmed home setup and PLOF with wife - 1 lvl home with 3 STE and B hand rails. Pt was fully indep (does not drive) without DME. Has x2 daughters in the [place] area that can provide assist as needed. Wife reports patient mows the lawn and enjoys lawn care - denies any falls at home.  BP assessed with results below. Donned knee-high compression socks as MD requesting if diastolic BP <<22  BP 125/50 , HR 52 sitting without compression socks BP 136/51, HR 53 with compression socks  Transported patient in w/c to main rehab hallway.   Focused remainder of session on gait training using R hand rail and +2 assist for w/c follow for safety. Patient apprehensive on attempting to walk - reports he can't do it. Needing some encouragement and positive feedback during session.  Sit<>stand to hand rail with minA and L knee blocked for safety. Ambulated length of rail, ~335f+ ~3543fwith seated rest breaks) with minA and +2 for w/c follow. Pt able to manage ~75% of gait cycle and needing assist only for LLE placement and gentle blocking. Trunk control adequate with rail support. Decreased L foot clearance and drag with swing.   Initiated stair training using the 3inch steps and 2 hand rails. He required min/modA for navigatign x8 steps. Had trouble with recalling sequencing of leading  ascent with R and descent with L. Step-to pattern while forward facing. Required modA for safely stepping off of the last step and turning to sit in the wheelchair.  Returned to his room - concluded session seated in w/c with safety belt alarm on, wife at bedside, all needs met.    2nd session: Pt sitting in recliner with BLE elevated, wife at bedside. Pt in agreement to therapy session.   Donned B shoes with totalA for time. Encouraged patient to use toilet since he hasn't gone in >4 hours. Used Stedy for timEngineer, siteit<>stand [place] mod[place], ++ time for delayed processing and initiation. Stedy transfer with patient sitting with CGA on flaps - CGA for standing from perched position and minA for controlled lowering to toilet. Turned on water from shower to help with auditory cues to pee. Pt unable to void despite ++ time.   Patient and wife asking if patient can shower, hasn't showered in multiple days.. Used Stedy to dependently transfer patient onto shower chair with similar assist levels listed as above. Pt needing modA to scoot 90deg to face shower head. Pt completed shower while seated - attempted to stand once but patient unable and deferred further efforts due to safety concerns of environment. Pt encouraged to use LUE as much as possible for forced use - able to wash underarms but needed thoroughness for RUE due to L hand weakness. TotalA for washing his back and lower legs. Pt developing some learned helplessness  and needed direction to assist as much as possible for washing and drying himself. Sit<>stand from [place] from shower chair with modA and ++ time for delayed initiation.  Dependent transfer to EOB and assisted with dressing.   TotalA for time management for donning brief and pants. modA needed for donning shirt with difficulty pulling down backside and getting overhead. In [place], he completed oral care while sitting in perched position at the sink - pt needing min cues for  setup and sequencing - difficult for him to grasp toothpaste with his LUE.   Pt returned to bed at end of session. ModA needed for sit>supine for BLE management. HOB Raised, alarm on, call bell in reach, wife at bedside. All needs met.    Therapy Documentation Precautions:  Precautions Precautions: Fall Precaution Comments: L hemi, dementia Restrictions Weight Bearing Restrictions: No General:    Therapy/Group: Individual Therapy  Nilsa Macht P Zayan Delvecchio PT 07/26/2022, 7:35 AM

## 2022-07-27 LAB — GLUCOSE, CAPILLARY
Glucose-Capillary: 103 mg/dL — ABNORMAL HIGH (ref 70–99)
Glucose-Capillary: 135 mg/dL — ABNORMAL HIGH (ref 70–99)
Glucose-Capillary: 175 mg/dL — ABNORMAL HIGH (ref 70–99)
Glucose-Capillary: 150 mg/dL — ABNORMAL HIGH (ref 70–99)

## 2022-07-27 MED ORDER — DICLOFENAC SODIUM 1 % EX GEL
2.0000 g | Freq: Four times a day (QID) | CUTANEOUS | Status: DC
  Administered 2022-07-27 – 2022-08-02 (×19): 2 g via TOPICAL
  Filled 2022-07-27: qty 100

## 2022-07-27 NOTE — Progress Notes (Signed)
Speech Language Pathology Daily Session Note  Patient Details  Name: Mitchell Washington MRN: [id number] Date of Birth: [birth year]  Today's Date: 07/27/2022 SLP Individual Time: 1300-1400 SLP Individual Time Calculation (min): 60 min  Short Term Goals: Week 1: SLP Short Term Goal 1 (Week 1): Pt will utilize pacing, overarticulation, and increased vocal intensity to improve speech intelligibility to 70% at the phrase level given Mod A multimodal cues. SLP Short Term Goal 2 (Week 1): Pt will participate in EMST assessment to determine candidacy for device with 100% completion. SLP Short Term Goal 3 (Week 1): Pt will verbalize 2 techniques/modifications that would be helpful to aid in speech comprehensibility within the context of functional communication tasks with Mod A following education. SLP Short Term Goal 4 (Week 1): Pt will demonstrate orientation x 4 with use of external aids and Min A. SLP Short Term Goal 5 (Week 1): Pt will verbalize reason for hospitaliization and 2 deficits s/p CVA with Mod-Max A.  Skilled Therapeutic Interventions: Pt seen for skilled ST with focus on speech goals, pt in bed with wife present throughout. With extra time, pt able to recall "stroke" for reason of hospitalization, required mod A cues to ID 2 deficits s/p CVA. Pt required total A to recall speech intelligibility strategies as posted in room and max A cues to utilize throughout session. Pt with limited awareness of decreased speech intelligibility and communication breakdowns, pts wife helpful in attempting to clarify as she was able. Pt generally oriented to simple concepts, able to state upcoming Holiday, correct MOY and was off by 1 DOW and 1 date. Pt participating in biographical recall and simple comparing/contrasting activity to target speech production, benefiting from min-mod A cues for accuracy and extra processing time. Pt and wife with no questions, wife reports "I'm shocked" at how well patient  performed this date. Cont ST POC.   Pain Pain Assessment Pain Scale: 0-10 Pain Score: 0-No pain  Therapy/Group: Individual Therapy  Tacey Ruiz 07/27/2022, 1:54 PM

## 2022-07-27 NOTE — IPOC Note (Signed)
Overall Plan of Care [facility]) Patient Details Name: Mitchell Washington MRN: [id number] DOB: 30-Jan-1951  Admitting Diagnosis: Posterior circulation stroke [facility])  Hospital Problems: Principal Problem:   Posterior circulation stroke [facility])     Functional Problem List: Nursing Behavior, Pain, Bladder, Bowel, Safety, Edema, Sensory, Endurance, Medication Management, Motor  PT Balance, Endurance, Motor, Pain, Sensory, Skin Integrity, Safety  OT Balance, Safety, Sensory, Endurance, Motor  SLP Motor  TR         Basic ADL's: OT Grooming, Eating, Bathing, Dressing, Toileting     Advanced  ADL's: OT       Transfers: PT Bed Mobility, Car, Bed to Chair  OT Tub/Shower, Toilet     Locomotion: PT Ambulation, Stairs     Additional Impairments: OT Fuctional Use of Upper Extremity  SLP Communication expression    TR      Anticipated Outcomes Item Anticipated Outcome  Self Feeding    Swallowing  N/A   Basic self-care  Min A  Toileting  Min A   Bathroom Transfers Min A  Bowel/Bladder  continent B/B  Transfers  CGA with LRAD  Locomotion  minA with LRAD  Communication  Min A  Cognition  Min A for orientation; Mod A for awareness  Pain  less than 4  Safety/Judgment  remain fall free while in rehab   Therapy Plan: PT Intensity: Minimum of 1-2 x/day ,45 to 90 minutes PT Frequency: 5 out of 7 days PT Duration Estimated Length of Stay: 2 weeks OT Intensity: Minimum of 1-2 x/day, 45 to 90 minutes OT Frequency: 5 out of 7 days OT Duration/Estimated Length of Stay: 2 weeks SLP Intensity: Minumum of 1-2 x/day, 30 to 90 minutes SLP Frequency: 1 to 3 out of 7 days SLP Duration/Estimated Length of Stay: 10-14 days   Team Interventions: Nursing Interventions Patient/Family Education, Disease Management/Prevention, Discharge Planning, Bladder Management, Pain Management, Cognitive Remediation/Compensation, Bowel Management, Medication Management  PT interventions Ambulation/gait  training, Balance/vestibular training, Cognitive remediation/compensation, Community reintegration, Discharge planning, Disease management/prevention, DME/adaptive equipment instruction, Functional electrical stimulation, Functional mobility training, Neuromuscular re-education, Pain management, Patient/family education, Psychosocial support, Splinting/orthotics, Stair training, Therapeutic Activities, Therapeutic Exercise, UE/LE Strength taining/ROM, UE/LE Coordination activities, Visual/perceptual remediation/compensation, Wheelchair propulsion/positioning  OT Interventions Balance/vestibular training, Discharge planning, Functional electrical stimulation, Pain management, Self Care/advanced ADL retraining, Therapeutic Activities, UE/LE Coordination activities, Functional mobility training, Patient/family education, Therapeutic Exercise, Community reintegration, DME/adaptive equipment instruction, Neuromuscular re-education, Splinting/orthotics, UE/LE Strength taining/ROM, Wheelchair propulsion/positioning  SLP Interventions Internal/external aids, Functional tasks, Multimodal communication approach, Patient/family education  TR Interventions    SW/CM Interventions Discharge Planning, Psychosocial Support, Patient/Family Education   Barriers to Discharge MD  Medical stability  Nursing Decreased caregiver support, Home environment access/layout, Incontinence, Behavior 1 level with 3 ste and rails on right and left  PT Decreased caregiver support, Home environment access/layout, Lack of/limited family support, Community education officer for SNF coverage    OT      SLP      SW Other (comments) dementia and stm issues from previous CVA's   Team Discharge Planning: Destination: PT-Home ,OT- Home , SLP-Home Projected Follow-up: PT-Home health PT, 24 hour supervision/assistance, OT-  Home health OT, SLP-Home Health SLP, Outpatient SLP, 24 hour supervision/assistance Projected Equipment Needs: PT-To be determined,  OT- 3 in 1 bedside comode, Tub/shower bench, SLP-None recommended by SLP Equipment Details: PT- , OT-  Patient/family involved in discharge planning: PT- Patient,  OT-Patient, Family member/caregiver, SLP-Patient  MD ELOS: 10-14 days Medical Rehab Prognosis:  Excellent Assessment: The patient  has been admitted for CIR therapies with the diagnosis of right paramedial ventral pons infarct.The team will be addressing functional mobility, strength, stamina, balance, safety, adaptive techniques and equipment, self-care, bowel and bladder mgt, patient and caregiver education. Goals have been set at [facility]. Anticipated discharge destination is home.        See Team Conference Notes for weekly updates to the plan of care

## 2022-07-27 NOTE — Progress Notes (Signed)
PROGRESS NOTE   Subjective/Complaints:  "Something is always achy" Working with SLP , aphasia ROS: denies SOB, abd distress  Objective:   No results found. Recent Labs    07/25/22 0627  WBC 9.5  HGB 12.0*  HCT 36.4*  PLT 433*    Recent Labs    07/25/22 0627  NA 141  K 4.2  CL 109  CO2 24  GLUCOSE 105*  BUN 36*  CREATININE 1.69*  CALCIUM 9.3     Intake/Output Summary (Last 24 hours) at 07/27/2022 1322 Last data filed at 07/27/2022 0321 Gross per 24 hour  Intake 220 ml  Output 400 ml  Net -180 ml         Physical Exam: Vital Signs Blood pressure 130/61, pulse 62, temperature 98 F (36.7 C), temperature source Oral, resp. rate 18, height 5\' 9"  (1.753 m), weight 77.1 kg, SpO2 99 %.  General: No acute distress Mood and affect are appropriate Heart: Regular rate and rhythm no rubs murmurs or extra sounds Lungs: Clear to auscultation, breathing unlabored, no rales or wheezes Abdomen: Positive bowel sounds, soft nontender to palpation, nondistended Extremities: No clubbing, cyanosis, or edema Skin: No evidence of breakdown, no evidence of rash  Neuro:  Patient is alert.  Oriented to name , , not hospital, able to give  month, not year or date, Makes eye contact with examiner.  Speech is a dysarthric.  Follows simple commands. Able to name 2 items. Decreased shoulder shrug on Left,appears to have some L inattention,  EOMI, tongue midline,Left facial weakness, PERRL, visual field full Strength RUE and RLE 4+/5 LUE 3/5 shoulder abduction, 4-/5 elbow flexion, 3/5 elbow extension, finger flexion 3/5 LLE 1-2/5 hip flexion and knee extension, 3/5 ankle PF and DF Right FTN intact Sensation to LT altered in LUE and LLE Musculoskeletal: Left knee tenderness, no effusion,  no abnormal tone noted      Assessment/Plan: 1. Functional deficits which require 3+ hours per day of interdisciplinary therapy in  a comprehensive inpatient rehab setting. Physiatrist is providing close team supervision and 24 hour management of active medical problems listed below. Physiatrist and rehab team continue to assess barriers to discharge/monitor patient progress toward functional and medical goals  Care Tool:  Bathing    Body parts bathed by patient: Left arm, Chest, Abdomen, Front perineal area, Face   Body parts bathed by helper: Right arm, Buttocks, Right upper leg, Left upper leg, Right lower leg, Left lower leg     Bathing assist Assist Level: Moderate Assistance - Patient 50 - 74%     Upper Body Dressing/Undressing Upper body dressing   What is the patient wearing?: Pull over shirt    Upper body assist Assist Level: Moderate Assistance - Patient 50 - 74%    Lower Body Dressing/Undressing Lower body dressing      What is the patient wearing?: Incontinence brief, Pants     Lower body assist Assist for lower body dressing: Maximal Assistance - Patient 25 - 49%     Toileting Toileting    Toileting assist Assist for toileting: Total Assistance - Patient < 25%     Transfers Chair/bed transfer  Transfers assist  Chair/bed transfer assist level: Moderate Assistance - Patient 50 - 74%     Locomotion Ambulation   Ambulation assist   Ambulation activity did not occur: Safety/medical concerns (vasovagal after toileting)  Assist level: 2 helpers Assistive device: Other (comment) (R hand rail) Max distance: 62ft   Walk 10 feet activity   Assist  Walk 10 feet activity did not occur: Safety/medical concerns  Assist level: 2 helpers     Walk 50 feet activity   Assist Walk 50 feet with 2 turns activity did not occur: Safety/medical concerns         Walk 150 feet activity   Assist Walk 150 feet activity did not occur: Safety/medical concerns         Walk 10 feet on uneven surface  activity   Assist Walk 10 feet on uneven surfaces activity did not occur:  Safety/medical concerns         Wheelchair     Assist Is the patient using a wheelchair?: Yes Type of Wheelchair: Manual    Wheelchair assist level: Dependent - Patient 0%      Wheelchair 50 feet with 2 turns activity    Assist        Assist Level: Dependent - Patient 0%   Wheelchair 150 feet activity     Assist      Assist Level: Dependent - Patient 0%   Blood pressure 130/61, pulse 62, temperature 98 F (36.7 C), temperature source Oral, resp. rate 18, height 5\' 9"  (1.753 m), weight 77.1 kg, SpO2 99 %.  Medical Problem List and Plan: 1. 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  Antithrombotics: -DVT/anticoagulation:  Pharmaceutical: Heparin             -antiplatelet therapy: Aspirin 81 mg daily and Brilinta 90 mg twice daily. 3. Pain Management: Tylenol as needed 4. Mood/Behavior/Sleep: Aricept 10 mg nightly, Namenda 10 mg twice daily.             -antipsychotic agents: N/A 5. Neuropsych/cognition: This patient is not capable of making decisions on his own behalf. 6. Skin/Wound Care: Routine skin checks 7. Fluids/Electrolytes/Nutrition: Routine in and outs with follow-up chemistries 8.  History of Hypertension with current hypotensive and hypertensive readings:  continue Norvasc 10 mg daily, d/c Coreg, continue lisinopril 40 mg daily.  Monitor with increased mobility. BP goal SBP 130-150 Vitals:   07/26/22 1943 07/27/22 0317  BP: 131/60 130/61  Pulse: (!) 58 62  Resp: 18 18  Temp: 98.1 F (36.7 C) 98 F (36.7 C)  SpO2: 99% 99%   Controlled 12/9 9.  Diabetes mellitus with peripheral neuropathy.  Hemoglobin A1c 6.5.   Glucophage 500 mg twice daily, Glucotrol 5 mg daily. Monitor closely as he is on prednisone.Change glipizide to with supper.  CBG (last 3)  Recent Labs    07/26/22 2049 07/27/22 0558 07/27/22 1146  GLUCAP 249* 103* 135*   Controlled  10. Hyperlipidemia.  Lipitor 80mg  daily 11. CKD2. Cr uptrending- will place nursing order to encourage 6-8 glasses of water  per day 12. Dementia. Continue Aricept and Namenda  13. Gout L knee. Prednisone 20mg  for 5 days.add volatren gel 14. Vasovagal episode: resolved with discontinuation of Coreg 15. Bradycardia: d/c Coreg  LOS: 3 days A FACE TO FACE EVALUATION WAS PERFORMED  07/27/2022, 1:22 PM

## 2022-07-28 LAB — GLUCOSE, CAPILLARY
Glucose-Capillary: 129 mg/dL — ABNORMAL HIGH (ref 70–99)
Glucose-Capillary: 128 mg/dL — ABNORMAL HIGH (ref 70–99)
Glucose-Capillary: 160 mg/dL — ABNORMAL HIGH (ref 70–99)
Glucose-Capillary: 192 mg/dL — ABNORMAL HIGH (ref 70–99)
Glucose-Capillary: 198 mg/dL — ABNORMAL HIGH (ref 70–99)

## 2022-07-28 LAB — BASIC METABOLIC PANEL WITH GFR
Anion gap: 12 (ref 5–15)
BUN: 45 mg/dL — ABNORMAL HIGH (ref 8–23)
CO2: 22 mmol/L (ref 22–32)
Calcium: 9.1 mg/dL (ref 8.9–10.3)
Chloride: 103 mmol/L (ref 98–111)
Creatinine, Ser: 1.7 mg/dL — ABNORMAL HIGH (ref 0.61–1.24)
GFR, Estimated: 43 mL/min — ABNORMAL LOW (ref >60)
Glucose, Bld: 185 mg/dL — ABNORMAL HIGH (ref 70–99)
Potassium: 4.9 mmol/L (ref 3.5–5.1)
Sodium: 137 mmol/L (ref 135–145)

## 2022-07-28 NOTE — Progress Notes (Signed)
Physical Therapy Session Note  Patient Details  Name: Mitchell Washington MRN: [id number] Date of Birth: 06/25/1951  Today's Date: 07/28/2022 PT Individual Time: 1015-1059  + 1300-1411 PT Individual Time Calculation (min): 44 min  + 71 min  Short Term Goals: Week 1:  PT Short Term Goal 1 (Week 1): Pt will complete bed mobility with modA PT Short Term Goal 2 (Week 1): Pt will complete bed<>chair transfers with minA and LRAD PT Short Term Goal 3 (Week 1): Pt will ambulate 71f with minA and LRAD  Skilled Therapeutic Interventions/Progress Updates:      1st session: Pt in bed on arrival with his wife at the bedside. Pt initially refuses therapy, stating "I don't feel like it." Wife encouraging and motivating patient to participate - pt agreeable with convincing. Wife also reporting patient was diaphoretic after having a BM with OT earlier this AM - she reports that the MD requests orthostatics. Pt has knee high compression socks on.  Supine: 149/60 HR 60 Sitting: 139/65 HR 69 Standing: 174/75 HR 86  While standing - pt with incontinent episode - incontinent of urine. Charted in flowsheets. TotalA for brief change. Pt able to stand from perched position with CGA - minA for standing from EOB height. Stedy transfer to w/c for time management.  Transported to main rehab gym for time and energy conservation.   Pt needing encouragement to participate in therapy.   Sit<>stand to RW with minA. Ambulated 942fwith Rw and + 2assist for w/c follow for safety. Decreased L foot clearance and L weight shift - able to advance LLE without assist. minA only for RW management and for trunk support.   Pt returned to his room in w/c and remained sitting in w/c at end of session. Wife updated on pt's tolerance to therapy. Seat belt alarm on at end of session. Assist for scooting hips back in w/c. All needs met.   2nd session: Pt finishing up lunch in the bed with his wife's assistance. Pt in agreement to  therapy session.  Supine<>sitting EOB with supervision and use of hospital bed features - ++ time for processing and initiation. Patient is developing some learned helplessness - encouraged effort throughout session.  Donned tennis shoes with totalA for time. Sit<>stand to RW with minA from EOB with cues needed for hand placement. Completed stand<>pivot transfer with minA and RW to w/c - cues for stepping and sequencing, and needing cues for hand placement to w/c for sitting.   Transported to ortho rehab gym. Completed ambulatory car transfer (car height simulating typical sedan) with minA and RW. Pt needing minA for managing LLE in/out of vehicle. He also needed mod cues for safety approach and sequencing for car transfer.   Gait training on unlevel surfaces using ~61f65famp. He ambulated up/down ramp with minA and RW - cues for pacing and safety awareness while descent as he has difficulty controlling RW.   Without rest break, ambulated an additional ~75f74fth minA and RW to Nustep - gait unsteady with ?jerky movements. SetupA for Nustep for LLE management. Completed 12 minutes at L5 resistance, using BUE/BLE, to encourage reciprocal movement pattern and attending to his L side. Pt maintained a cadence of ~30 steps/minute throughout.   Transported to main rehab gym in w/c for energy conservation. Stair training using the 6inch steps and 2 hand rails. He navigated up/down 4 steps with min/mod assist. Step-to pattern while forward facing, fear of falling and difficulty sequencing due to motor planning deficits.  Instructed in Timed up and Go (TUG) using the RW: Trial 1) 60.5 seconds Trial 2) 68 seconds *Scores > 13.5 seconds indicate increased falls risk.  Gait training ~65ft with minA and RW back to his room - cues for increasing L step length and L step height - difficult to see knee from pants but possible knee buckling vs hyperextension. Family in room on return and pleased to see him  ambulating. MinA for bed mobility for LLE support. Concluded session in bed with alarm on, family at bedside. Provided ice water per request.   Therapy Documentation Precautions:  Precautions Precautions: Fall Precaution Comments: L hemi, dementia Restrictions Weight Bearing Restrictions: No General:     Therapy/Group: Individual Therapy   P  PT 07/28/2022, 7:26 AM  

## 2022-07-28 NOTE — Progress Notes (Signed)
PROGRESS NOTE   Subjective/Complaints:  Per nursing had some diaphoresis this am while on toilet having a BM  , vitals reviewed afebrile, CBG in normal range ROS: denies SOB, abd distress  Objective:   No results found. No results for input(s): "WBC", "HGB", "HCT", "PLT" in the last 72 hours.  No results for input(s): "NA", "K", "CL", "CO2", "GLUCOSE", "BUN", "CREATININE", "CALCIUM" in the last 72 hours.   Intake/Output Summary (Last 24 hours) at 07/28/2022 0952 Last data filed at 07/28/2022 0650 Gross per 24 hour  Intake 957 ml  Output 675 ml  Net 282 ml         Physical Exam: Vital Signs Blood pressure (!) 143/63, pulse 65, temperature 97.6 F (36.4 C), temperature source Oral, resp. rate 18, height 5\' 9"  (1.753 m), weight 77.1 kg, SpO2 98 %.  General: No acute distress Mood and affect are appropriate Heart: Regular rate and rhythm no rubs murmurs or extra sounds Lungs: Clear to auscultation, breathing unlabored, no rales or wheezes Abdomen: Positive bowel sounds, soft nontender to palpation, nondistended Extremities: No clubbing, cyanosis, or edema Skin: No evidence of breakdown, no evidence of rash  Neuro:  Patient is alert.  Oriented to name , , not hospital, able to give  month, not year or date, Makes eye contact with examiner.  Speech is a dysarthric.  Follows simple commands. Able to name 2 items. Decreased shoulder shrug on Left,appears to have some L inattention,  EOMI, tongue midline,Left facial weakness, PERRL, visual field full Strength RUE and RLE 4+/5 LUE 3/5 shoulder abduction, 4-/5 elbow flexion, 3/5 elbow extension, finger flexion 3/5 LLE 1-2/5 hip flexion and knee extension, 3/5 ankle PF and DF Right FTN intact Sensation to LT altered in LUE and LLE Musculoskeletal: Left knee tenderness, no effusion,  no abnormal tone noted      Assessment/Plan: 1. Functional deficits which  require 3+ hours per day of interdisciplinary therapy in a comprehensive inpatient rehab setting. Physiatrist is providing close team supervision and 24 hour management of active medical problems listed below. Physiatrist and rehab team continue to assess barriers to discharge/monitor patient progress toward functional and medical goals  Care Tool:  Bathing    Body parts bathed by patient: Left arm, Chest, Abdomen, Front perineal area, Face   Body parts bathed by helper: Right arm, Buttocks, Right upper leg, Left upper leg, Right lower leg, Left lower leg     Bathing assist Assist Level: Moderate Assistance - Patient 50 - 74%     Upper Body Dressing/Undressing Upper body dressing   What is the patient wearing?: Pull over shirt    Upper body assist Assist Level: Moderate Assistance - Patient 50 - 74%    Lower Body Dressing/Undressing Lower body dressing      What is the patient wearing?: Incontinence brief, Pants     Lower body assist Assist for lower body dressing: Maximal Assistance - Patient 25 - 49%     Toileting Toileting    Toileting assist Assist for toileting: Total Assistance - Patient < 25%     Transfers Chair/bed transfer  Transfers assist     Chair/bed transfer assist level: Moderate Assistance -  Patient 50 - 74%     Locomotion Ambulation   Ambulation assist   Ambulation activity did not occur: Safety/medical concerns (vasovagal after toileting)  Assist level: 2 helpers Assistive device: Other (comment) (R hand rail) Max distance: 75ft   Walk 10 feet activity   Assist  Walk 10 feet activity did not occur: Safety/medical concerns  Assist level: 2 helpers     Walk 50 feet activity   Assist Walk 50 feet with 2 turns activity did not occur: Safety/medical concerns         Walk 150 feet activity   Assist Walk 150 feet activity did not occur: Safety/medical concerns         Walk 10 feet on uneven surface  activity   Assist  Walk 10 feet on uneven surfaces activity did not occur: Safety/medical concerns         Wheelchair     Assist Is the patient using a wheelchair?: Yes Type of Wheelchair: Manual    Wheelchair assist level: Dependent - Patient 0%      Wheelchair 50 feet with 2 turns activity    Assist        Assist Level: Dependent - Patient 0%   Wheelchair 150 feet activity     Assist      Assist Level: Dependent - Patient 0%   Blood pressure (!) 143/63, pulse 65, temperature 97.6 F (36.4 C), temperature source Oral, resp. rate 18, height 5\' 9"  (1.753 m), weight 77.1 kg, SpO2 98 %.  Medical Problem List and Plan: 1. 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  Antithrombotics: -DVT/anticoagulation:  Pharmaceutical: Heparin             -antiplatelet therapy: Aspirin 81 mg daily and Brilinta 90 mg twice daily. 3. Pain Management: Tylenol as needed 4. Mood/Behavior/Sleep: Aricept 10 mg nightly, Namenda 10 mg twice daily.             -antipsychotic agents: N/A 5. Neuropsych/cognition: This patient is not capable of making decisions on his own behalf. 6. Skin/Wound Care: Routine skin checks 7. Fluids/Electrolytes/Nutrition: Routine in and outs with follow-up chemistries 8.  History of Hypertension with current hypotensive and hypertensive readings:  continue Norvasc 10 mg daily, d/c Coreg, continue lisinopril 40 mg daily.  Monitor with increased mobility. BP goal SBP 130-150 Vitals:   07/28/22 0404 07/28/22 0746  BP: (!) 143/63   Pulse: 65   Resp: 18   Temp: (!) 97.3 F (36.3 C) 97.6 F (36.4 C)  SpO2: 98%    Controlled 12/10 but will check ortho  vitals , order TED hose and abd binder  If there eis an orthostatic drop  9.  Diabetes mellitus with peripheral neuropathy.  Hemoglobin A1c 6.5.  Glucophage 500 mg twice daily, Glucotrol 5 mg daily. Monitor closely as he is on prednisone.Change glipizide to with supper.  CBG (last 3)  Recent Labs    07/27/22 2059 07/28/22 0558 07/28/22 0743  GLUCAP 150* 129* 128*    Controlled 12/10 10. Hyperlipidemia.  Lipitor  80mg  daily 11. CKD2. Cr uptrending- will place nursing order to encourage 6-8 glasses of water per day 12. Dementia. Continue Aricept and Namenda  13. Gout L knee. Prednisone 20mg  for 5 days.(Completed) add volatren gel 14. Vasovagal episode: resolved with discontinuation of Coreg 15. Bradycardia: d/c Coreg  LOS: 4 days A FACE TO FACE EVALUATION WAS PERFORMED  07/28/2022, 9:52 AM

## 2022-07-28 NOTE — Progress Notes (Signed)
Occupational Therapy Session Note  Patient Details  Name: Mitchell Washington MRN: [id number] Date of Birth: October 14, 1950  Today's Date: 07/28/2022 OT Individual Time: 5400-8676 OT Individual Time Calculation (min): 68 min    Short Term Goals: Week 1:  OT Short Term Goal 1 (Week 1): Pt will progress bed mobility to Min A in order to transition to sitting on EOB to progress OOB activity for morning ADL. OT Short Term Goal 2 (Week 1): Pt will increase LB bathing to Mod A while utilizing AE as needed. OT Short Term Goal 3 (Week 1): Pt will increase LB dressing to Mod A while utilizing hemi dressing techniques. OT Short Term Goal 4 (Week 1): Pt will complete toilet transfer with Mod A.  Skilled Therapeutic Interventions/Progress Updates:  Pt greeted supine in bed, pt agreeable to OT intervention.  Staff reports pt was sweaty this AM after having BM however vitals WFL, assessed BP from supine- 135/57( 78) HR 60 bpm  Session focus on BADL reeducation, functional mobility, dynamic standing balance, LUE coordination and decreasing overall caregiver burden.       Pt completed supine>sit with light MIN A to elevate trunk and MOD cues for sequencing tasks. Pt donned pants from EOB with MODA, pt stood to stedy with MIN A to pull pants up to waist line. Total  A transport into w/c. Donned teds and shoes with total A.   Pt completed Oral care seated at sink with supervision however pt required MOD cues for sequencing of task, pt did initiate using BUEs to complete washing of dentures.   Total A transport to gym in w/c for time mgmt.  Worked on sit>stands at railing with pt able to stand with MODA with LUE pushing up from w/c and RUE on railing, pt needed physical assist to power up and tactile cues to activate L quad in standing. Pt reports pain in L knee from standing therefore terminated task for pain mgmt.   Worked on LUE coordination tasks with pt instructed to reach to wall to remove post its on L side  of wall with LUE with an emphasis on L sided attention and in hand manipulation skills, pt completed task with min cues for technique and L sided visual awareness as pt initially didn't see 2 on L side.   Worked on coordination task of stacking cones with LUE from sitting position with an emphasis on AROM elbow flexion/extension and motor planning. Graded task up and had pt complete same task with 1.5 wrist weight donned for NMR, pt reports increased difficulty with weight donned however pt completed task with supervision with + time.   Pt able to sit to remove clothespins from railing at table top with an emphasis on LUE FMC. Pt then reports nausea. Total A transport back to room in w/c.   Pt noted to perspire once back in room. Pt completed sqaut pivot transfer from w/c>EOB to R side with MODA. MOD A for sit >supine needing assist to elevate LLE back to bed. BP checked from supine- 160/63 ( 91) HR 58 Ended session with pt supine in bed with all needs within reach and bed alarm activated.                   Therapy Documentation Precautions:  Precautions Precautions: Fall Precaution Comments: L hemi, dementia Restrictions Weight Bearing Restrictions: No  Pain: 8/10 pain in L knee, rest breaks provided as needed.    Therapy/Group: Individual Therapy  Barron Schmid  07/28/2022, 12:11 PM

## 2022-07-29 LAB — GLUCOSE, CAPILLARY
Glucose-Capillary: 100 mg/dL — ABNORMAL HIGH (ref 70–99)
Glucose-Capillary: 151 mg/dL — ABNORMAL HIGH (ref 70–99)
Glucose-Capillary: 175 mg/dL — ABNORMAL HIGH (ref 70–99)
Glucose-Capillary: 100 mg/dL — ABNORMAL HIGH (ref 70–99)

## 2022-07-29 MED ORDER — AMLODIPINE BESYLATE 5 MG PO TABS
5.0000 mg | ORAL_TABLET | Freq: Every day | ORAL | Status: DC
  Administered 2022-07-30 – 2022-07-31 (×2): 5 mg via ORAL
  Filled 2022-07-29 (×2): qty 1

## 2022-07-29 NOTE — Progress Notes (Signed)
Occupational Therapy Session Note  Patient Details  Name: Mitchell Washington MRN: [id number] Date of Birth: 04-06-1951  Today's Date: 07/29/2022 OT Individual Time: 1005-1045 OT Individual Time Calculation (min): 40 min    Short Term Goals: Week 1:  OT Short Term Goal 1 (Week 1): Pt will progress bed mobility to Min A in order to transition to sitting on EOB to progress OOB activity for morning ADL. OT Short Term Goal 2 (Week 1): Pt will increase LB bathing to Mod A while utilizing AE as needed. OT Short Term Goal 3 (Week 1): Pt will increase LB dressing to Mod A while utilizing hemi dressing techniques. OT Short Term Goal 4 (Week 1): Pt will complete toilet transfer with Mod A.  Skilled Therapeutic Interventions/Progress Updates:    Patient received supine in bed with eyes closed.  Wife, Pattricia Boss at bedside.  Patient opened eyes to directive, but declining getting out of bed.  Patient encouraged to get up out of bed to improve strength.  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  Patient able to transfer to wheelchair with min assist to either right or left side.  Patient placed in front of sink to complete oral care.  Then transported to gym to further address functional mobility.   Patient did better with directives versus requests for participation.  Was pleasant throughout session, and redirected easily.   Patient returned to room, wife still at bedside.  Seat belt in place and engaged, and call bell resting on bed.     Therapy Documentation Precautions:  Precautions Precautions: Fall Precaution Comments: L hemi, dementia Restrictions Weight Bearing Restrictions: No   Pain: Pain Assessment Pain Scale: 0-10 Pain Score: 9/10 left knee - let nurse know - patient was offered Tylenol which  he declined.     Therapy/Group: Individual Therapy  Collier Salina 07/29/2022, 12:31 PM

## 2022-07-29 NOTE — Progress Notes (Signed)
Speech Language Pathology Weekly Progress and Session Note  Patient Details  Name: Mitchell Washington MRN: [id number] Date of Birth: [birth year]  Beginning of progress report period: July 22, 2022 End of progress report period: July 29, 2022  Today's Date: 07/29/2022 SLP Individual Time: 1062-6948 SLP Individual Time Calculation (min): 25 min  Short Term Goals: Week 1: SLP Short Term Goal 1 (Week 1): Pt will utilize pacing, overarticulation, and increased vocal intensity to improve speech intelligibility to 70% at the phrase level given Mod A multimodal cues. SLP Short Term Goal 1 - Progress (Week 1): Not met SLP Short Term Goal 2 (Week 1): Pt will participate in EMST assessment to determine candidacy for device with 100% completion. SLP Short Term Goal 2 - Progress (Week 1): Discontinued (comment) SLP Short Term Goal 3 (Week 1): Pt will verbalize 2 techniques/modifications that would be helpful to aid in speech comprehensibility within the context of functional communication tasks with Mod A following education. SLP Short Term Goal 3 - Progress (Week 1): Not met SLP Short Term Goal 4 (Week 1): Pt will demonstrate orientation x 4 with use of external aids and Min A. SLP Short Term Goal 4 - Progress (Week 1): Not met SLP Short Term Goal 5 (Week 1): Pt will verbalize reason for hospitaliization and 2 deficits s/p CVA with Mod-Max A. SLP Short Term Goal 5 - Progress (Week 1): Not met    New Short Term Goals: Week 2: SLP Short Term Goal 1 (Week 2): STG's = LTG's due to ELOS  Weekly Progress Updates: Pt demonstrates minimal/slow progress this past reporting period as evident by only meeting 0 out of 4 goals; 1 discontinued. Pt continues to present with moderate dysarthria, which negatively impacts pt's speech intelligibility and functional communication. At this time, pt benefits from Max A, from SLP, to utilize compensatory strategies and use of external aids despite mass practice;  minimal carryover/generalization observed. Continue to recommend 24/7 supervision and assistance, and will benefit from additional therapy f/u at time of discharge. Wife has been present for a portion of therapy to observe therapeutic tasks and skilled compensatory strategies and tasks.  Intensity: Minumum of 1-2 x/day, 30 to 90 minutes Frequency: 1 to 3 out of 7 days Duration/Length of Stay:   Treatment/Interventions: Internal/external aids;Functional tasks;Multimodal communication approach;Patient/family education   Daily Session  Skilled Therapeutic Interventions:     Pt seen this date for skilled ST intervention targeting speech intelligibility goals outlined above. Pt received awake/alert and OOB in w/c. Wife present. Reporting that he wanted to get back in bed; SLP redirected pt easily. Agreeable to intervention in speech office.  SLP facilitated speech intelligibility by providing Max A verbal cues, decibel meter for biofeedback/awareness purposes, and pacing board for implementation of compensatory speech intelligibility strategies when pt was verbalizing functional phrases and short sentences re: biographical information. Decibel meter appeared to be the most beneficial this session with pt's intelligibility subjectively improving to ~75-80% at the word to short phrase level given meter and clinician prompts. Upon return to pt's room, SLP provided education to pt and pt's wife re: today's session and helpful interventions. Of note, pt unable to recall details of today's session to relay to wife.   Pt left in room and OOB in w/c with all safety measures activated and call bell within reach. Continue per current ST POC.  Pain L leg pain reported; LPN notified and provided medication  Therapy/Group: Individual Therapy   A  07/29/2022, 1:09 PM

## 2022-07-29 NOTE — Progress Notes (Addendum)
Occupational Therapy Session Note  Patient Details  Name: [name] MRN: [id number] Date of Birth: 1951-02-11  Today's Date: 07/29/2022 OT Individual Time: 1303-1340 OT Individual Time Calculation (min): 37 min    Short Term Goals: Week 1:  OT Short Term Goal 1 (Week 1): Pt will progress bed mobility to Min A in order to transition to sitting on EOB to progress OOB activity for morning ADL. OT Short Term Goal 2 (Week 1): Pt will increase LB bathing to Mod A while utilizing AE as needed. OT Short Term Goal 3 (Week 1): Pt will increase LB dressing to Mod A while utilizing hemi dressing techniques. OT Short Term Goal 4 (Week 1): Pt will complete toilet transfer with Mod A.  Skilled Therapeutic Interventions/Progress Updates:  Pt received supine in bed with wife present for skilled OT session with focus on L-hand strengthing/coordination and functional transfers. Pt agreeable to interventions, but demonstrating overall liable mood. Pt with un-rated pain in L-knee, stating "it comes and goes" in reference to his pain. OT offering intermediate rest breaks and positioning suggestions throughout session to address pain/fatigue and maximize participation/safety in session.   Pt performs sup>EOB with increased time and supervision. Seated EOB, pt's vitals taken due to patient complaints of lightheadedness.  -BP=157/62 -HR=69 -O2=100  Pt then transfers EOB<>WC with Min A, requiring cuing for safe hand placement and use of RW. Pt able to don tennis shoes with Mod A for his heels/tying. Pt dependent for WC transport from room<>therapy room for time conservation.   In therapy gym, pt participates in strengthening/coordination activity targeting pincer grip with use of yellow resistive clip, and the transport of wooden beads into a cup in far-right side (to encourage crossing midline) for coordination. Pt able to perform successful transport of ~7-8 beads with increased time, decreased frustration  tolerance noted benefiting from intermediate breaks.   In room, pt performs EOB>Sup with Mod A for LE management due to pain and hip alignment for better bed positioning. Pt's L-knee than wrapped with a heat pack to address acute pain.   Pt remained supine in bed with wife present and all immediate needs met at end of session. Pt continues to be appropriate for skilled OT intervention to promote further functional independence.   Therapy Documentation Precautions:  Precautions Precautions: Fall Precaution Comments: L hemi, dementia Restrictions Weight Bearing Restrictions: No  Therapy/Group: Individual Therapy  Maudie Mercury, OTR/L, MSOT  07/29/2022, 12:57 PM

## 2022-07-29 NOTE — Progress Notes (Signed)
Physical Therapy Session Note  Patient Details  Name: Mitchell Washington MRN: [id number] Date of Birth: 11/22/1950  Today's Date: 07/29/2022 PT Individual Time: 0830-0915 + 1400-1442 PT Individual Time Calculation (min): 45 min  + 42 min  Short Term Goals: Week 1:  PT Short Term Goal 1 (Week 1): Pt will complete bed mobility with modA PT Short Term Goal 2 (Week 1): Pt will complete bed<>chair transfers with minA and LRAD PT Short Term Goal 3 (Week 1): Pt will ambulate 50ft with minA and LRAD  Skilled Therapeutic Interventions/Progress Updates:      1st session: Pt supine in bed to start with his wife at bedside. Pt needing encouragement to participate in therapy, stating "I don't wanna" in regards to therapy session. Denies pain.   Supine<>sitting EOB with supervision with hospital bed features, mod cues for sequencing due to motor planning deficits. Needs ++ time for processing and initiation.  Pt able to remove hospital socks without assist while seated EOB - stiff hips and unable to achieve figure-4 position, needs ++ time to complete. Donned shoes with totalA for time. Pt reporting need to have BM. Sit<>stand to RW with CGA (!) from low EOB height. Ambulates with minA and RW to the bathroom, ~15ft. Cues for safety awareness while navigating threshold at the doorway and cues for increasing L foot clearance and step length. Pt needing assist for controlled lowering to the toilet. Continent of large, loose BM. Charted in flowsheets. Pt noted to become diaphoretic while on toilet with decreased alertness and response. BP taken while on toilet 111/92. Pt needing totalA for posterior pericare using Stedy and totalA for standing using the Stedy. Dependent transfer back to bed and for lying supine in bed. BP retaken in bed 136/62. Provided cold washcloth. Medical team notified of pt's response via secure chat. MD also entering room who was notified.   Alarm on, wife at bedside, MD at bedside, all  needs met at end of session.     2nd session: Pt in bed with his wife at the bedside. Pt needing encouragement to participate in therapy session. Focused session on family education and training. Reviewed PT goals, PT POC, anticipated DME recs (BSC, transport wheelchair, RW), HH therapies, etc. Informed her of 2 week stay recommendation but wife is hoping for sooner discharge, hoping for the end of the week. Encouraged wife to join during therapy session so she can observe his mobilty which she agreed to.  Supine<>sitting EOB with HOB flat, no bed rails, to simulate home environment. Pt needing minA for trunk elevation for sitting to upright. Recommended that wife look into bed rails for purchase to help with bed mobility - she voiced understanding. Sit<>stand to RW with CGA, ++ time for initiation. Stand<>pivot transfer with CGA and RW to w/c with cues for safety approach and hand placement - poor eccentric control for sitting.   Transported to main rehab gym for time management. Gait training ~100ft with CGA/minA and RW - educated wife on how to safely guard and position self for assisting with ambulating. Recommending guarding on his L and hand on gait belt at all times. Patient with soft L knee buckling vs hyperextension - balance worsens after ~75ft of gait with increased unsteadiness.  Wife reports 3 STE home with 2 hand rails (can reach  both). Stair climbing using 6inch steps and 2 hand rails. Completed with CGA/minA with mod cues for sequencing, fading to min cues after the 2nd set. He navigated up/down 4 +   4 steps with seated rest. Educated wife on guarding and helped with problem solving their home entrance. Wife appreciative of feedback.  Returned to his room and encouraged patient to remain out of bed for at least 30 minutes. Wife at bedside, all needs met.      Therapy Documentation Precautions:  Precautions Precautions: Fall Precaution Comments: L hemi,  dementia Restrictions Weight Bearing Restrictions: No General:     Therapy/Group: Individual Therapy   P  PT 07/29/2022, 7:32 AM  

## 2022-07-29 NOTE — Progress Notes (Signed)
PROGRESS NOTE   Subjective/Complaints: Vasovagaled this morning with BM again Vitals are stable, overall improved off Coreg.  Cr is 1.7 which is slightly higher than it has been  ROS: denies SOB, abd distress, +vasovagal episodes after having BM Objective:   No results found. No results for input(s): "WBC", "HGB", "HCT", "PLT" in the last 72 hours.  Recent Labs    07/28/22 1145  NA 137  K 4.9  CL 103  CO2 22  GLUCOSE 185*  BUN 45*  CREATININE 1.70*  CALCIUM 9.1    Intake/Output Summary (Last 24 hours) at 07/29/2022 1236 Last data filed at 07/29/2022 3729 Gross per 24 hour  Intake 1555 ml  Output 1075 ml  Net 480 ml        Physical Exam: Vital Signs Blood pressure 129/64, pulse 65, temperature 97.9 F (36.6 C), temperature source Oral, resp. rate 18, height 5\' 9"  (1.753 m), weight 77.1 kg, SpO2 97 %.  Gen: no distress, normal appearing HEENT: oral mucosa pink and moist, NCAT Cardio: Reg rate Chest: normal effort, normal rate of breathing Abd: soft, non-distended Ext: no edema Psych: pleasant, normal affect Skin: intact  Neuro:  Patient is alert.  Oriented to name , , not hospital, able to give  month, not year or date, Makes eye contact with examiner.  Speech is a dysarthric.  Follows simple commands. Able to name 2 items. Decreased shoulder shrug on Left,appears to have some L inattention,  EOMI, tongue midline,Left facial weakness, PERRL, visual field full Strength RUE and RLE 4+/5 LUE 3/5 shoulder abduction, 4-/5 elbow flexion, 3/5 elbow extension, finger flexion 3/5 LLE 1-2/5 hip flexion and knee extension, 3/5 ankle PF and DF Right FTN intact Sensation to LT altered in LUE and LLE Musculoskeletal: Left knee tenderness, no effusion,  no abnormal tone noted      Assessment/Plan: 1. Functional deficits which require 3+ hours per day of interdisciplinary therapy in a comprehensive  inpatient rehab setting. Physiatrist is providing close team supervision and 24 hour management of active medical problems listed below. Physiatrist and rehab team continue to assess barriers to discharge/monitor patient progress toward functional and medical goals  Care Tool:  Bathing    Body parts bathed by patient: Left arm, Chest, Abdomen, Front perineal area, Face   Body parts bathed by helper: Right arm, Buttocks, Right upper leg, Left upper leg, Right lower leg, Left lower leg     Bathing assist Assist Level: Moderate Assistance - Patient 50 - 74%     Upper Body Dressing/Undressing Upper body dressing   What is the patient wearing?: Pull over shirt    Upper body assist Assist Level: Moderate Assistance - Patient 50 - 74%    Lower Body Dressing/Undressing Lower body dressing      What is the patient wearing?: Incontinence brief, Pants     Lower body assist Assist for lower body dressing: Maximal Assistance - Patient 25 - 49%     Toileting Toileting    Toileting assist Assist for toileting: Total Assistance - Patient < 25%     Transfers Chair/bed transfer  Transfers assist     Chair/bed transfer assist level: Moderate Assistance -  Patient 50 - 74%     Locomotion Ambulation   Ambulation assist   Ambulation activity did not occur: Safety/medical concerns (vasovagal after toileting)  Assist level: 2 helpers Assistive device: Other (comment) (R hand rail) Max distance: 48ft   Walk 10 feet activity   Assist  Walk 10 feet activity did not occur: Safety/medical concerns  Assist level: 2 helpers     Walk 50 feet activity   Assist Walk 50 feet with 2 turns activity did not occur: Safety/medical concerns         Walk 150 feet activity   Assist Walk 150 feet activity did not occur: Safety/medical concerns         Walk 10 feet on uneven surface  activity   Assist Walk 10 feet on uneven surfaces activity did not occur: Safety/medical  concerns         Wheelchair     Assist Is the patient using a wheelchair?: Yes Type of Wheelchair: Manual    Wheelchair assist level: Dependent - Patient 0%      Wheelchair 50 feet with 2 turns activity    Assist        Assist Level: Dependent - Patient 0%   Wheelchair 150 feet activity     Assist      Assist Level: Dependent - Patient 0%   Blood pressure 129/64, pulse 65, temperature 97.9 F (36.6 C), temperature source Oral, resp. rate 18, height 5\' 9"  (1.753 m), weight 77.1 kg, SpO2 97 %.  Medical Problem List and Plan: 1. 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  Antithrombotics: -DVT/anticoagulation:  Pharmaceutical: Heparin             -antiplatelet therapy: Aspirin 81 mg daily and Brilinta 90 mg twice daily. 3. Pain Management: Tylenol as needed 4. Mood/Behavior/Sleep: Aricept 10 mg nightly, Namenda 10 mg twice daily.             -antipsychotic agents: N/A 5. Neuropsych/cognition: This patient is not capable of making decisions on his own behalf. 6. Skin/Wound Care: Routine skin checks 7. Fluids/Electrolytes/Nutrition: Routine in and outs with follow-up chemistries 8.  History of Hypertension with current hypotensive and hypertensive readings:  decrease norvasc to 5mg , d/c Coreg, continue lisinopril 40 mg daily.  Monitor with increased mobility. BP goal SBP 130-150 Vitals:   07/28/22 1916 07/29/22 0227  BP: (!) 144/57 129/64  Pulse: 68 65  Resp: 18 18  Temp: 98.2 F (36.8 C) 97.9 F (36.6 C)  SpO2: 99% 97%   Controlled 12/10 but will check ortho vitals , order TED hose and abd binder  If there eis an orthostatic  drop  9.  Diabetes mellitus with peripheral neuropathy.  Hemoglobin A1c 6.5.  Glucophage 500 mg twice daily, Glucotrol 5 mg daily. Monitor closely as he is on prednisone.Change glipizide to with supper.  CBG (last 3)  Recent Labs    07/28/22 2107 07/29/22 0601 07/29/22 1140  GLUCAP 198* 100* 151*   Controlled 12/10 10. Hyperlipidemia.  Lipitor 80mg  daily 11. CKD2.  Cr uptrending- will place nursing order to encourage 6-8 glasses of water per day. Repeat creatinine tomorrow.  12. Dementia. Continue Aricept and Namenda  13. Gout L knee. Prednisone 20mg  for 5 days.(Completed) add volatren gel 14. Vasovagal episode: d/c Coreg. Decrease amlodipine to 5mg  15. Bradycardia: d/c Coreg. Resolved.  LOS: 5 days A FACE TO FACE EVALUATION WAS PERFORMED  07/29/2022, 12:36 PM

## 2022-07-30 LAB — BASIC METABOLIC PANEL
Anion gap: 10 (ref 5–15)
BUN: 56 mg/dL — ABNORMAL HIGH (ref 8–23)
CO2: 22 mmol/L (ref 22–32)
Calcium: 9.2 mg/dL (ref 8.9–10.3)
Chloride: 106 mmol/L (ref 98–111)
Creatinine, Ser: 1.86 mg/dL — ABNORMAL HIGH (ref 0.61–1.24)
GFR, Estimated: 38 mL/min — ABNORMAL LOW (ref >60)
Glucose, Bld: 88 mg/dL (ref 70–99)
Potassium: 5.2 mmol/L — ABNORMAL HIGH (ref 3.5–5.1)
Sodium: 138 mmol/L (ref 135–145)

## 2022-07-30 LAB — GLUCOSE, CAPILLARY
Glucose-Capillary: 101 mg/dL — ABNORMAL HIGH (ref 70–99)
Glucose-Capillary: 173 mg/dL — ABNORMAL HIGH (ref 70–99)
Glucose-Capillary: 99 mg/dL (ref 70–99)
Glucose-Capillary: 89 mg/dL (ref 70–99)

## 2022-07-30 MED ORDER — SODIUM ZIRCONIUM CYCLOSILICATE 5 G PO PACK
5.0000 g | PACK | Freq: Once | ORAL | Status: AC
  Administered 2022-07-30: 5 g via ORAL
  Filled 2022-07-30: qty 1

## 2022-07-30 NOTE — Progress Notes (Signed)
PROGRESS NOTE   Subjective/Complaints: No more vasovagal episodes when using bathroom today, discussed with wife that I decreased amlodipine dose and have her a list of foods to help with blood pressure control  ROS: denies SOB, abd distress, +vasovagal episodes after having BM- none today Objective:   No results found. No results for input(s): "WBC", "HGB", "HCT", "PLT" in the last 72 hours.  Recent Labs    07/28/22 1145 07/30/22 0511  NA 137 138  K 4.9 5.2*  CL 103 106  CO2 22 22  GLUCOSE 185* 88  BUN 45* 56*  CREATININE 1.70* 1.86*  CALCIUM 9.1 9.2    Intake/Output Summary (Last 24 hours) at 07/30/2022 1044 Last data filed at 07/30/2022 0651 Gross per 24 hour  Intake 952 ml  Output 250 ml  Net 702 ml        Physical Exam: Vital Signs Blood pressure 126/65, pulse 77, temperature 98.1 F (36.7 C), temperature source Oral, resp. rate 17, height 5\' 9"  (1.753 m), weight 77.1 kg, SpO2 98 %.  Gen: no distress, normal appearing, BMI 25.10 HEENT: oral mucosa pink and moist, NCAT Cardio: Reg rate Chest: normal effort, normal rate of breathing Abd: soft, non-distended Ext: no edema Psych: pleasant, normal affect Skin: intact  Neuro:  Patient is alert.  Oriented to name , , not hospital, able to give  month, not year or date, Makes eye contact with examiner.  Speech is a dysarthric.  Follows simple commands. Able to name 2 items. Decreased shoulder shrug on Left,appears to have some L inattention,  EOMI, tongue midline,Left facial weakness, PERRL, visual field full Strength RUE and RLE 4+/5 LUE 3/5 shoulder abduction, 4-/5 elbow flexion, 3/5 elbow extension, finger flexion 3/5 LLE 1-2/5 hip flexion and knee extension, 3/5 ankle PF and DF Right FTN intact Sensation to LT altered in LUE and LLE Musculoskeletal: Left knee tenderness, no effusion,  no abnormal tone noted      Assessment/Plan: 1.  Functional deficits which require 3+ hours per day of interdisciplinary therapy in a comprehensive inpatient rehab setting. Physiatrist is providing close team supervision and 24 hour management of active medical problems listed below. Physiatrist and rehab team continue to assess barriers to discharge/monitor patient progress toward functional and medical goals  Care Tool:  Bathing    Body parts bathed by patient: Left arm, Chest, Abdomen, Front perineal area, Face   Body parts bathed by helper: Right arm, Buttocks, Right upper leg, Left upper leg, Right lower leg, Left lower leg     Bathing assist Assist Level: Moderate Assistance - Patient 50 - 74%     Upper Body Dressing/Undressing Upper body dressing   What is the patient wearing?: Pull over shirt    Upper body assist Assist Level: Moderate Assistance - Patient 50 - 74%    Lower Body Dressing/Undressing Lower body dressing      What is the patient wearing?: Incontinence brief, Pants     Lower body assist Assist for lower body dressing: Maximal Assistance - Patient 25 - 49%     Toileting Toileting    Toileting assist Assist for toileting: Total Assistance - Patient < 25%  Transfers Chair/bed transfer  Transfers assist     Chair/bed transfer assist level: Moderate Assistance - Patient 50 - 74%     Locomotion Ambulation   Ambulation assist   Ambulation activity did not occur: Safety/medical concerns (vasovagal after toileting)  Assist level: 2 helpers Assistive device: Other (comment) (R hand rail) Max distance: 7ft   Walk 10 feet activity   Assist  Walk 10 feet activity did not occur: Safety/medical concerns  Assist level: 2 helpers     Walk 50 feet activity   Assist Walk 50 feet with 2 turns activity did not occur: Safety/medical concerns         Walk 150 feet activity   Assist Walk 150 feet activity did not occur: Safety/medical concerns         Walk 10 feet on uneven  surface  activity   Assist Walk 10 feet on uneven surfaces activity did not occur: Safety/medical concerns         Wheelchair     Assist Is the patient using a wheelchair?: Yes Type of Wheelchair: Manual    Wheelchair assist level: Dependent - Patient 0%      Wheelchair 50 feet with 2 turns activity    Assist        Assist Level: Dependent - Patient 0%   Wheelchair 150 feet activity     Assist      Assist Level: Dependent - Patient 0%   Blood pressure 126/65, pulse 77, temperature 98.1 F (36.7 C), temperature source Oral, resp. rate 17, height 5\' 9"  (1.753 m), weight 77.1 kg, SpO2 98 %.  Medical Problem List and Plan: 1. 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  Antithrombotics: -DVT/anticoagulation:  Pharmaceutical: Heparin             -antiplatelet therapy: Aspirin 81 mg daily and Brilinta 90 mg twice daily. 3. Pain Management: Tylenol as needed 4. Mood/Behavior/Sleep: Aricept 10 mg nightly, Namenda 10 mg twice daily.             -antipsychotic agents: N/A 5. Neuropsych/cognition: This patient is not capable of making decisions on his own behalf. 6. Skin/Wound Care: Routine skin checks 7. Fluids/Electrolytes/Nutrition: Routine in and outs with follow-up chemistries 8.  History of Hypertension with current hypotensive and hypertensive readings:  decrease norvasc to 5mg , d/c Coreg, continue lisinopril 40 mg daily.  Monitor with increased mobility. BP goal SBP 130-150 Vitals:   07/29/22 1932 07/30/22 0421  BP: (!) 128/56 126/65  Pulse: 60 77  Resp: 16 17  Temp: 97.9 F (36.6 C) 98.1 F  (36.7 C)  SpO2: 98% 98%   Controlled 12/10 but will check ortho vitals , order TED hose and abd binder  If there eis an orthostatic drop  9.  Diabetes mellitus with peripheral neuropathy.  Hemoglobin A1c 6.5.  Glucophage 500 mg twice daily, Glucotrol 5 mg daily. Monitor closely as he is on prednisone.Change glipizide to with supper.  CBG (last 3)  Recent Labs  07/29/22 1659 07/29/22 2040 07/30/22 0635  GLUCAP 175* 100* 101*   Hold metformin given worsening creatinine.  10. Hyperlipidemia.  Lipitor 80mg  daily 11. CKD2. Cr uptrending- will place nursing order to encourage 6-8 glasses of water per day. Hold metformin since Creatinine is worsening.  12. Dementia. Continue Aricept and Namenda  13. Gout L knee. Prednisone 20mg  for 5 days.(Completed) add volatren gel 14. Vasovagal episode: d/c Coreg. Decrease amlodipine to 5mg . Discussed with wife.  15. Bradycardia: d/c Coreg. Resolved.   LOS: 6 days A FACE TO FACE EVALUATION WAS PERFORMED  Aubreyanna Dorrough 07/30/2022, 10:44 AM

## 2022-07-30 NOTE — Progress Notes (Signed)
Occupational Therapy Session Note  Patient Details  Name: Mitchell Washington MRN: [id number] Date of Birth: [birth year]  Today's Date: 07/30/2022 OT Individual Time: 1315-1345 OT Individual Time Calculation (min): 30 min    Short Term Goals: Week 1:  OT Short Term Goal 1 (Week 1): Pt will progress bed mobility to Min A in order to transition to sitting on EOB to progress OOB activity for morning ADL. OT Short Term Goal 2 (Week 1): Pt will increase LB bathing to Mod A while utilizing AE as needed. OT Short Term Goal 3 (Week 1): Pt will increase LB dressing to Mod A while utilizing hemi dressing techniques. OT Short Term Goal 4 (Week 1): Pt will complete toilet transfer with Mod A.  Skilled Therapeutic Interventions/Progress Updates:     Pt received resting in bed with wife present in room. Pt presenting in overall pleasant affect and reporting 0/10 pain. Nursing staff in/out of room during session to collect Pt vitals. Focus of session transfer training and d/c planning with wife with a focus on home set-up and DME.   Pt wife educated on differene between tub transfer benches and shower chair. Pt wife confirming need for BSC upon d/c. Pt wife reporting shower at home is walk-in without door (just a shower current) and grab bars have recently been added.   Pt supine>EOB with HOB slightly elevated mod A to lift trunk and bring LLE off bed. Pt required mod verbal cues to problem solve lifting trunk with education provided on log rolling technique. Wife reported she will be getting bed rails to support Pt independence with bed mobility.   Pt sit>stand using RW CGA with mod VB cues for safety and hand placement. Pt ambulated to bathroom for shower chair transfer using RW. Wife present and asking questions throughout session to improve carry over and safety upon d/c home. Pt required CGA to complete shower chair transfer using RW and mod VB cues for safety, hand placement, and technique. Pt ambulated  from bathroom to bed using RW CGA and completed side steps to R with min A to the [facility]. Pt min A +time back to bed. Pt was left resting in bed with wife present in room, call bell in reach, bed alarm on, and all needs met.   Therapy Documentation Precautions:  Precautions Precautions: Fall Precaution Comments: L hemi, dementia Restrictions Weight Bearing Restrictions: No  ADL: ADL Eating: Set up Where Assessed-Eating: Bed level Grooming: Setup Where Assessed-Grooming: Sitting at sink, Wheelchair Upper Body Bathing: Moderate assistance Where Assessed-Upper Body Bathing: Sitting at sink, Wheelchair Lower Body Bathing: Maximal assistance Where Assessed-Lower Body Bathing: Chair, Sitting at sink Upper Body Dressing: Minimal assistance Where Assessed-Upper Body Dressing: Sitting at sink, Wheelchair Lower Body Dressing: Maximal assistance Where Assessed-Lower Body Dressing: Sitting at sink, Wheelchair Toileting: Dependent Where Assessed-Toileting: Glass blower/designer: Maximal Print production planner Method: Arts development officer: Grab bars, Engineer, technical sales Transfer: Not assessed Social research officer, government: Not assessed   Therapy/Group: Individual Therapy  Janey Genta 07/30/2022, 1:33 PM

## 2022-07-30 NOTE — Progress Notes (Signed)
Physical Therapy Session Note  Patient Details  Name: Mitchell Washington MRN: [id number] Date of Birth: February 09, 1951  Today's Date: 07/30/2022 PT Individual Time: 0300-9233 PT Individual Time Calculation (min): 73 min   Short Term Goals: Week 1:  PT Short Term Goal 1 (Week 1): Pt will complete bed mobility with modA PT Short Term Goal 2 (Week 1): Pt will complete bed<>chair transfers with minA and LRAD PT Short Term Goal 3 (Week 1): Pt will ambulate 54ft with minA and LRAD  Skilled Therapeutic Interventions/Progress Updates:      Pt lying in bed on arrival with his wife at bedside. Pt in agreement (with encouragement) to PT treatment. Denies pain. Assisted with donning B compression socks with totalA for time.   Supine<>sitting EOB with HOB flat to simulate home environment. Pt able to complete with supervision although it was very effortful for him with multiple failed attempts, taking more than a reasonable amount of time to accomplish. Recommend bed rails to ease burden of bed mobility at home - wife aware.  Sit<>stand to RW with close supervision - cues for hand placement to push from bed rather than pull from RW. Short distance ambulatory transfer with CGA and RW to w/c, cues for safety approach to wheelchair - pt with poor eccentric control to sit.  Transported to main rehab gym for energy conservation.   Sit<>stand to RW from w/c with CGA - cues for hand placement. Pt ambulated 2105ft + 280ft with CGA/minA and RW - required minA after 115ft due to fatigue. X1 instance of L knee buckling that was self corrected. Cues for increasing L step height/length and for keeping body within walker frame. Sometimes loses his balance due to insufficient step length on L with decreased awareness.   Completed BERG balance testing with results outlined below. Pt needing multiple, prolonged rest breaks b/w some of the tasks. Pt educated on findings of balance testing, placing him at high risk of falls and  need for RW use at all times.   Patient demonstrates increased fall risk as noted by score of   15/56 on Berg Balance Scale.  (<36= high risk for falls, close to 100%; 37-45 significant >80%; 46-51 moderate >50%; 52-55 lower >25%)  Continued to work on standing balance with CGA: -1x15 chest press with 2lb small med ball -1x5 shldr press with 2lb small med ball -1x8 thoracic rotations with 2lb small med ball  Returned to room in w/c for energy conservation. Pt requesting to return to bed due to fatigue. Short distance ambulatory transfer with CGA and RW to bed. Pt able to "kick off" shoes without assist. Sit>Supine with supervision and HOB flat, very effortful due to L sided weakness. Alarm on, wife at bedside.  Therapy Documentation Precautions:  Precautions Precautions: Fall Precaution Comments: L hemi, dementia Restrictions Weight Bearing Restrictions: No General:    Balance: Balance Balance Assessed: Yes Standardized Balance Assessment Standardized Balance Assessment: Berg Balance Test Berg Balance Test Sit to Stand: Able to stand using hands after several tries Standing Unsupported: Able to stand 2 minutes with supervision Sitting with Back Unsupported but Feet Supported on Floor or Stool: Able to sit safely and securely 2 minutes Stand to Sit: Sits independently, has uncontrolled descent Transfers: Needs one person to assist Standing Unsupported with Eyes Closed: Able to stand 10 seconds with supervision Standing Ubsupported with Feet Together: Needs help to attain position but able to stand for 30 seconds with feet together From Standing, Reach Forward with Outstretched Arm:  Loses balance while trying/requires external support From Standing Position, Pick up Object from Floor: Unable to try/needs assist to keep balance From Standing Position, Turn to Look Behind Over each Shoulder: Needs assist to keep from losing balance and falling Turn 360 Degrees: Needs assistance while  turning Standing Unsupported, Alternately Place Feet on Step/Stool: Needs assistance to keep from falling or unable to try Standing Unsupported, One Foot in Front: Loses balance while stepping or standing Standing on One Leg: Unable to try or needs assist to prevent fall Total Score: 15/56  Therapy/Group: Individual Therapy  Mitchell Washington Mitchell Washington PT 07/30/2022, 7:45 AM

## 2022-07-30 NOTE — Progress Notes (Addendum)
Occupational Therapy Session Note  Patient Details  Name: Mitchell Washington MRN: [id number] Date of Birth: 08-Sep-1950  Today's Date: 07/30/2022 OT Individual Time: 0277-4128 OT Individual Time Calculation (min): 57 min    Short Term Goals: Week 1:  OT Short Term Goal 1 (Week 1): Pt will progress bed mobility to Min A in order to transition to sitting on EOB to progress OOB activity for morning ADL. OT Short Term Goal 2 (Week 1): Pt will increase LB bathing to Mod A while utilizing AE as needed. OT Short Term Goal 3 (Week 1): Pt will increase LB dressing to Mod A while utilizing hemi dressing techniques. OT Short Term Goal 4 (Week 1): Pt will complete toilet transfer with Mod A.  Skilled Therapeutic Interventions/Progress Updates:  Pt received seated in [facility] for skilled OT session with focus on ADL retraining. Pt agreeable to interventions, demonstrating overall pleasant affect. Pt with un-rated pain in BLEs, stating "I'm out of it" when resting back in bed at end of session. OT offering intermediate rest breaks and positioning suggestions throughout session to address pain/fatigue and maximize participation/safety in session.   Pt performs room-level functional mobility with CGA + RW to complete shower and full-body dressing seated EOB. Pt performs multiple STS transfers during session with CGA-Min A (for lift-off) + RW, requiring Max verbal cuing for safe RW management/hand placement due to motor planning deficits.  Seated on padded TTB in shower, pt stands with use of grab bar + CGA to doff brief/pants. Pt requires total A to doff TEDs, doffing scrub top with cuing for over-head technique.  Pt then performs full-body bathing, with cues to initiate cleaning of BLEs/peri-area and set-up of wash-cloth. Pt incorporates use of L-hand to wash RUE independently. Pt provided with long-handled sponge to more thoroughly wash LE without increasing pain.   Seated EOB, pt dons briefs/pants to thighs  requiring Min A for threading, standing with CGA + RW to don over bottom/hips needing A for fabric management, and requiring increased time + re-education for hemi-dressing techniques. Pt educated on the use of sock-aid to don socks due to pt's decreased hip flexibility and LE pain. Pt requires Mod A to thread socks and initiate pulling of sock-aid for both feet.   Pt takes multiple side-steps towards HOB with CGA + RW, performing EOB>sup with Mod A for LE management due to fatigue.  Pt remained supine in bed with all immediate needs met and bed alarm activated at end of session. Pt continues to be appropriate for skilled OT intervention to promote further functional independence.    Therapy Documentation Precautions:  Precautions Precautions: Fall Precaution Comments: L hemi, dementia Restrictions Weight Bearing Restrictions: No    Therapy/Group: Individual Therapy  Maudie Mercury, OTR/L, MSOT  07/30/2022, 7:56 AM

## 2022-07-30 NOTE — Progress Notes (Addendum)
Speech Language Pathology Daily Session Note  Patient Details  Name: Mitchell Washington MRN: [id number] Date of Birth: [birth year]  Today's Date: 07/30/2022 SLP Individual Time: 0835-0930 SLP Individual Time Calculation (min): 55 min  Short Term Goals: Week 2: SLP Short Term Goal 1 (Week 2): STG's = LTG's due to ELOS  Skilled Therapeutic Interventions: Pt seen this date for skilled ST intervention targeting speech intelligibility goals outlined in care plan. Pt received awake/alert and receiving medication from RN. Agreeable to intervention in speech office. Transferred from bed to w/c with Stedy + 2 for safety. Continent of bowel and bladder with timed toileting. Wife present in room.  Today's session with emphasis on functional communication and speech intelligibility. Utilized skilled interventions of decibel meter, Mod A verbal and model prompts, and pacing board to assist with speech intelligibility and awareness at the word and phrase level. Pt only able to intermittently implement speech intelligibility strategies despite Mod-Max A multimodal cues and use of metacognitive strategies to build awareness. At peak performance, pt successfully achieved ~90% intelligibility at the single word level within a structured, automatic speech and functional phrase tasks. Enjoyed singing and discussing the upcoming holidays. Pt oriented x 4 with Min A + external aids. Recalled orientation information and speech intelligibility strategies, following delay, with Min A verbal cues + spaced retrieval techniques. Maintained attention throughout with clinician implemented environmental modifications. Mod-Max A for emergent awareness of current deficits, and how they impact pt's current functioning and understanding rationale for ST POC.  Pt returned to room and left OOB in w/c with all safety measures activated and call bell within reach. Wife present and session reviewed with her. Continue per current ST POC.    Pain Continues to report pain in L knee; RN provided medication  Therapy/Group: Individual Therapy  Dejanee Thibeaux A Zachrey Deutscher 07/30/2022, 12:38 PM

## 2022-07-30 NOTE — Progress Notes (Addendum)
Patient ID: Mitchell Washington, male   DOB: Nov 16, 1950, 71 y.o.   MRN: [id number]  Spoke with wife who does want her husband to go home soon if team is ok with it. She is staying he night so is here for any education needed. Discussed equipment needs and the fact will only cover transport chair or rolling walker she will get a rolling walker on her own for him, but does want the transport chair and bedside commode. Have made referral to [employer] for the above two pieces of equipment. Discussed follow up needs she reports they have had a home health in the past but doesn't remember the name. Have reached out to the area home health's to see if they can pull it up. Continue to work on discharge needs.  3:38 PM Gave wife handicapped application for parking she will take to [facility] or [facility].

## 2022-07-31 LAB — GLUCOSE, CAPILLARY
Glucose-Capillary: 171 mg/dL — ABNORMAL HIGH (ref 70–99)
Glucose-Capillary: 135 mg/dL — ABNORMAL HIGH (ref 70–99)
Glucose-Capillary: 147 mg/dL — ABNORMAL HIGH (ref 70–99)

## 2022-07-31 MED ORDER — AMLODIPINE BESYLATE 2.5 MG PO TABS
2.5000 mg | ORAL_TABLET | Freq: Every day | ORAL | Status: DC
  Administered 2022-08-01: 2.5 mg via ORAL
  Filled 2022-07-31: qty 1

## 2022-07-31 NOTE — Progress Notes (Signed)
Physical Therapy Session Note  Patient Details  Name: Mitchell Washington MRN: [id number] Date of Birth: [birth year]  Today's Date: 07/31/2022 PT Individual Time: 0845-1000 PT Individual Time Calculation (min): 75 min   Short Term Goals: Week 1:  PT Short Term Goal 1 (Week 1): Pt will complete bed mobility with modA PT Short Term Goal 2 (Week 1): Pt will complete bed<>chair transfers with minA and LRAD PT Short Term Goal 3 (Week 1): Pt will ambulate 9ft with minA and LRAD  Skilled Therapeutic Interventions/Progress Updates:      Pt resting in bed with wife at bedside. Pt needing encouragement to participate - offered to assist with showering to improve participation and initiating mobilizing OOB. Pt in agreement.  Supine<>Sitting EOB with HOB flat, use of bed rail. Completed with supervision but needing more than a reasonable amount of time to complete. Educated wife on benefits of having a bed rail at home to assist with bed mobility - she reports she will get one on her own as insurance doesn't cover this.  Once sitting EOB, allowed ++ time to acclimate to upright as patient reporting feeling "faint." BP taken while sitting EOB (compression socks on): 134/60 HR 65. Pt reporting symptoms resolve after a few minutes.  Sit<>stand to RW with supervision, using back of legs against bed to assist in stabilizing, cues for spilt hand placement. Ambulates with CGA and RW within his room to the bathroom and assisted onto shower chair. Once sitting, retook BP to make sure vitals WNL prior to showering: 146/63. Pt able to stand with supervision in shower using grab bars to pull himself up. Cues for side stepping in shower to better position himself on chair. Pt washed himself with assist for cleaning backside, periarea, and lower legs. Pt struggles with problem solving - needing max cues for using soap, turning off water, and placing shower wand back on. He is able to stand with close supervision using grab  bars in the shower to wash his backside.   Donned new clean brief with totalA and ambulated with RW and CGA to the bed, x1 instance of L knee buckling with navigating the threshold at the bathroom doorway. While seated EOB, pt finished dressing. Needing cues for hemi technique due to L sided weakness. Needing minA for threading LLE for LB dressing and minA for UB dressing for pulling shirt down on backside. Donned compression socks and hospital socks with totalA for time.  Ambulated sinkside with CGA and RW - pt brushed his teeth in standing with CGA for balance - pt leaning with his hips against sink for balance - min cues for setup for oral care. Pt becoming tired while standing to brush his teeth, diaphoretic. Assisted to sitting and BP checked: 146/56.   Tried to encourage patient to stay sitting in w/c at end of session due to therapy schedule and to promote upright tolerance. Pt wanting to lie down to rest b/w therapy. Assisted to bed and patient remained in bed with wife at bedside at end of session.  Therapy Documentation Precautions:  Precautions Precautions: Fall Precaution Comments: L hemi, dementia Restrictions Weight Bearing Restrictions: No General:     Therapy/Group: Individual Therapy  Sachi Boulay P Mohamud Mrozek PT 07/31/2022, 7:36 AM

## 2022-07-31 NOTE — Progress Notes (Signed)
Occupational Therapy Session Note  Patient Details  Name: Mitchell Washington MRN: [id number] Date of Birth: Feb 28, 1951  Today's Date: 07/31/2022 OT Individual Time: 6967-8938 OT Individual Time Calculation (min): 71 min    Short Term Goals: Week 1:  OT Short Term Goal 1 (Week 1): Pt will progress bed mobility to Min A in order to transition to sitting on EOB to progress OOB activity for morning ADL. OT Short Term Goal 2 (Week 1): Pt will increase LB bathing to Mod A while utilizing AE as needed. OT Short Term Goal 3 (Week 1): Pt will increase LB dressing to Mod A while utilizing hemi dressing techniques. OT Short Term Goal 4 (Week 1): Pt will complete toilet transfer with Mod A.  Skilled Therapeutic Interventions/Progress Updates:    Patient presents supine and sleeping in bed.  Wife in room.  Discussed with patient's wife any concerns regarding discharge on Friday.  Wife has been actively involved in therapy this week, and feels she can handle things at home.  Discussed plan for toileting at night - use of BSC next to bed.  Discussed use of commercially available briefs at home.  Also discussed resources for purchasing a bed rail.  Wife asking great questions, and clearly seems to be anticipating patient's needs.   Patient awoke to his name, and sat at edge of bed with minimal coaxing.  Patient frequently starts with "I don't want to" but can easily be redirected to participate.  Practiced BSC transfers with [facility] to left and right, with and without walker, and with therapist then wife.  Patient actually completed safest stand step transfer toward right without walker and wife able to safely assist him.   Patient transported to therapy gym to address functional mobility safety and left UE functioning.  Completed box and blocks test LUE - 12 blocks in one minute.   Complete grip strength testing - 20 LBS left, 80 lbs right.  Completed yellow theraputty exercises grip x 10.  Walked short distances with  walker and contact guard, with emphasis on turning, maneuvering to target, around obstacles, etc.  Patient returned to room and back to bed.  Wife at bedside.  Patient supine in bed with alarm on.    Therapy Documentation Precautions:  Precautions Precautions: Fall Precaution Comments: L hemi, dementia Restrictions Weight Bearing Restrictions: No  Pain: Pain Assessment Pain Scale: 0-10 Pain Score: 0-No pain Faces Pain Scale: No hurt   Therapy/Group: Individual Therapy  Collier Salina 07/31/2022, 3:29 PM

## 2022-07-31 NOTE — Progress Notes (Signed)
Patient ID: Mitchell Washington, male   DOB: 10/26/1950, 71 y.o.   MRN: [id number]  Met with pt and wife who is present in his room to update both regarding team conference goals of min level and discharge date of 12/15. Both are happy about this and feel he may do better at home due to familiar with his environment. Wife is staying and has been to therapies with him and done hands on care. Equipment in his room for home. Continue to work on discharge needs. 

## 2022-07-31 NOTE — Patient Care Conference (Signed)
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 Sula Soda Social Worker Present: Dossie Der, LCSW Nurse Present: Chana Bode, RN PT Present: Malachi Pro, PT OT Present: Other (comment) Bretta Bang, OT) SLP Present: Sarita Bottom, SLP PPS Coordinator present : Fae Pippin, SLP     Current Status/Progress Goal Weekly Team Focus  Bowel/Bladder   Continent of b/b, but have some incontinent episodes depending on mentation. LBM: 12/12   Regain continence.   Assist w/ toileting needs q 2-4 hours & PRN    Swallow/Nutrition/ Hydration   N/A   N/A  N/A    ADL's   Min assist BADL, Mod cueing for walker safety   Supervision/Min   discharge planning, family education with wife    Mobility   Very apathetic towards therapies. Performance fluctuates based on fatigue. Has some learned helplessness and relies heavily on wife. MinA bed mobility without hospital bed features. CGA sit<>stand using RW. CGA stand<>pivots using RW. Gait 115ft CGA/minA using RW. Navigating 4, 6inch steps with CGA using 2 hand rails. Wife involved in therapy on 12/11 to witness mobility and to better understand level of care. Wife requesting DC by end of week. Thursday - Friday - should be fine from PT standpoint. Recommending HHPT, transport chair, RW, BSC.   minA overall  DC planning, gait training, endurance and  strength training    Communication   Sup to Min A for verbal communication, Mod-Max A for improved speech intelligibility   Min A for verbal expressionl Mod-Max A for speech intelligibility at 80% (phrase level) - goal downgraded from evaluation   Continue to improve awareness of speech and utilize pacing board, decible meter, and external aids to slow pt's rate and increase vocal intensity    Safety/Cognition/ Behavioral Observations  Min A for orientation, Mod A for intellectual awareness, and Mod-Max A for emergent awareness   Min A for orientation, Mod-Max A for awareness   orientation and awareness via exernal aids    Pain   Pt denies pain.   Remain pain free.   Assess pain q shift & PRN    Skin   Dry flaky skin - cream applied per orders. Skin tear/bruising to R great toe - foam dressing applied.   Maintain skin integrity; Remain free from any new skin issues or breakdowns.  Assess skin q shift & PRN.      Discharge Planning:  Home with wife who is staying here with him due to his dementia. She has done some hands on education and prefers discharge by end of the week. Will arrange DME and [facility]   Team Discussion: Patient with vaso vagal episodes early week; MD stopped coreg and adjusted other meds with no further episodes noted. Adjusted DM medications. Patient is limited by apathy, and learned helplessness with hx of dementia.  Patient on target to meet rehab goals: No, currently needs min  assist for bed mobility and CGA for use of RW.  Needs mod - max assist for awareness  *See Care Plan and progress notes for long and short-term goals.   Revisions to Treatment Plan:  Downgraded SLP intelligibility goals   Teaching Needs: Safety, medications, dietary modifications, transfers, etc.   Current Barriers to Discharge: Decreased caregiver support  Possible Resolutions to Barriers: Family education HH follow up services DME: transport chair, RW, BSC Patient owns shower  chair, trialed in session with safe performance      Medical Summary Current Status: overweight, vasovagal episode, AKI on CKD, type 2 diabetes  Barriers to Discharge: Medical stability  Barriers to Discharge Comments: overweight, vasovagal episode, AKI on CKD, type 2 diabetes Possible Resolutions to [employer] Focus: provided dietary education, educated regarding water consuption, d/c metformin, continue to monitor CBGs, decrease amlodipine, d/c Coreg   Continued Need for Acute Rehabilitation Level of Care: The patient requires daily medical management by a physician with specialized training in physical medicine and rehabilitation for the following reasons: Direction of a multidisciplinary physical rehabilitation program to maximize functional independence : Yes Medical management of patient stability for increased activity during participation in an intensive rehabilitation regime.: Yes Analysis of laboratory values and/or radiology reports with any subsequent need for medication adjustment and/or medical intervention. : Yes   I attest that I was present, lead the team conference, and concur with the assessment and plan of the team.   Dorien Chihuahua B 07/31/2022, 4:33 PM

## 2022-07-31 NOTE — Progress Notes (Signed)
Physical Therapy Session Note  Patient Details  Name: Mitchell Washington MRN: [id number] Date of Birth: January 29, 1951  Today's Date: 07/31/2022 PT Individual Time: 1015-1046 PT Individual Time Calculation (min): 31 min   Short Term Goals: Week 1:  PT Short Term Goal 1 (Week 1): Pt will complete bed mobility with modA PT Short Term Goal 2 (Week 1): Pt will complete bed<>chair transfers with minA and LRAD PT Short Term Goal 3 (Week 1): Pt will ambulate 7ft with minA and LRAD  Skilled Therapeutic Interventions/Progress Updates:      Therapy Documentation Precautions:  Precautions Precautions: Fall Precaution Comments: L hemi, dementia Restrictions Weight Bearing Restrictions: No  Pt agreeable to PT with max encouragement from PT and family. Pt with unrated left side pain, pre-medicated and provided rest breaks for relief. Pt requires (S) for safety and increased time with supine<>sit and CGA for STS transfers no AD x 4 throughout session. Pt requires mod A for dynamic standing balance with kicking yoga block x 2 with RLE and CGA with kicking x 8 with L LE with unilateral hand held support. Pt requires CGA for dynamic reactive standing balance with ball toss with family member. Pt left semi-reclined in bed with alarm on and all needs within reach.    Therapy/Group: Individual Therapy  Truitt Leep Truitt Leep PT, DPT  07/31/2022, 7:43 AM

## 2022-07-31 NOTE — Plan of Care (Signed)
Goals downgraded due to slower than anticipated progress.  Problem: RH Expression Communication Goal: LTG Patient will increase speech intelligibility (SLP) Description: LTG: Patient will increase speech intelligibility at word/phrase/conversation level with cues, % of the time (SLP) Flowsheets Taken 07/31/2022 0656 LTG: Patient will increase speech intelligibility (SLP):  Moderate Assistance - Patient 50 - 74%  Maximal Assistance - Patient 25 - 49% Level: Phrase Taken 07/25/2022 1203 Percent of time patient will use intelligible speech: 80   Problem: RH Awareness Goal: LTG: Patient will demonstrate awareness during functional activites type of (SLP) Description: LTG: Patient will demonstrate awareness during functional activites type of (SLP) Flowsheets (Taken 07/31/2022 [phone]) Patient will demonstrate during cognitive/linguistic activities awareness type of:  Intellectual  Emergent LTG: Patient will demonstrate awareness during cognitive/linguistic activities with assistance of (SLP): (Mod-Max A) Other (Comment)

## 2022-07-31 NOTE — Progress Notes (Signed)
PROGRESS NOTE   Subjective/Complaints: No more vasovagal episodes when using bathroom today, discussed with wife that I decreased amlodipine dose and have her a list of foods to help with blood pressure control  ROS: denies SOB, abd distress, +vasovagal episodes after having BM- none today Objective:   No results found. No results for input(s): "WBC", "HGB", "HCT", "PLT" in the last 72 hours.  Recent Labs    07/28/22 1145 07/30/22 0511  NA 137 138  K 4.9 5.2*  CL 103 106  CO2 22 22  GLUCOSE 185* 88  BUN 45* 56*  CREATININE 1.70* 1.86*  CALCIUM 9.1 9.2    Intake/Output Summary (Last 24 hours) at 07/31/2022 1108 Last data filed at 07/30/2022 1326 Gross per 24 hour  Intake 180 ml  Output --  Net 180 ml        Physical Exam: Vital Signs Blood pressure 118/69, pulse 83, temperature 98.2 F (36.8 C), temperature source Oral, resp. rate 16, height 5\' 9"  (1.753 m), weight 77.1 kg, SpO2 100 %.  Gen: no distress, normal appearing, BMI 25.10 HEENT: oral mucosa pink and moist, NCAT Cardio: Reg rate Chest: normal effort, normal rate of breathing Abd: soft, non-distended Ext: no edema Psych: pleasant, normal affect Skin: intact  Neuro:  Patient is alert.  Oriented to name , , not hospital, able to give  month, not year or date, Makes eye contact with examiner.  Speech is a dysarthric.  Follows simple commands. Able to name 2 items. Decreased shoulder shrug on Left,appears to have some L inattention,  EOMI, tongue midline,Left facial weakness, PERRL, visual field full Strength RUE and RLE 4+/5 LUE 3/5 shoulder abduction, 4-/5 elbow flexion, 3/5 elbow extension, finger flexion 3/5 LLE 1-2/5 hip flexion and knee extension, 3/5 ankle PF and DF Right FTN intact Sensation to LT altered in LUE and LLE Musculoskeletal: Left knee tenderness, no effusion,  no abnormal tone noted  Sit to stand with RW with  S    Assessment/Plan: 1. Functional deficits which require 3+ hours per day of interdisciplinary therapy in a comprehensive inpatient rehab setting. Physiatrist is providing close team supervision and 24 hour management of active medical problems listed below. Physiatrist and rehab team continue to assess barriers to discharge/monitor patient progress toward functional and medical goals  Care Tool:  Bathing    Body parts bathed by patient: Right arm, Left arm, Chest, Abdomen, Front perineal area, Buttocks, Right upper leg, Left upper leg, Right lower leg, Left lower leg, Face   Body parts bathed by helper: Right arm, Buttocks, Right upper leg, Left upper leg, Right lower leg, Left lower leg     Bathing assist Assist Level: Supervision/Verbal cueing     Upper Body Dressing/Undressing Upper body dressing   What is the patient wearing?: Pull over shirt    Upper body assist Assist Level: Supervision/Verbal cueing    Lower Body Dressing/Undressing Lower body dressing      What is the patient wearing?: Incontinence brief, Pants     Lower body assist Assist for lower body dressing: Moderate Assistance - Patient 50 - 74%     Toileting Toileting    Toileting assist Assist for  toileting: Total Assistance - Patient < 25%     Transfers Chair/bed transfer  Transfers assist     Chair/bed transfer assist level: Moderate Assistance - Patient 50 - 74%     Locomotion Ambulation   Ambulation assist   Ambulation activity did not occur: Safety/medical concerns (vasovagal after toileting)  Assist level: 2 helpers Assistive device: Other (comment) (R hand rail) Max distance: 68ft   Walk 10 feet activity   Assist  Walk 10 feet activity did not occur: Safety/medical concerns  Assist level: 2 helpers     Walk 50 feet activity   Assist Walk 50 feet with 2 turns activity did not occur: Safety/medical concerns         Walk 150 feet activity   Assist Walk 150  feet activity did not occur: Safety/medical concerns         Walk 10 feet on uneven surface  activity   Assist Walk 10 feet on uneven surfaces activity did not occur: Safety/medical concerns         Wheelchair     Assist Is the patient using a wheelchair?: Yes Type of Wheelchair: Manual    Wheelchair assist level: Dependent - Patient 0%      Wheelchair 50 feet with 2 turns activity    Assist        Assist Level: Dependent - Patient 0%   Wheelchair 150 feet activity     Assist      Assist Level: Dependent - Patient 0%   Blood pressure 118/69, pulse 83, temperature 98.2 F (36.8 C), temperature source Oral, resp. rate 16, height 5\' 9"  (1.753 m), weight 77.1 kg, SpO2 100 %.  Medical Problem List and Plan: 1. 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  Antithrombotics: -DVT/anticoagulation:  Pharmaceutical: Heparin             -antiplatelet therapy: Aspirin 81 mg daily and Brilinta 90 mg twice daily. 3. Pain Management: Tylenol as needed 4. Dementia: continue Aricept 10 mg nightly, Namenda 10 mg twice daily.             -antipsychotic agents: N/A 5. Neuropsych/cognition: This patient is not capable of making decisions on his own behalf. 6. Skin/Wound Care: Routine skin checks 7. Fluids/Electrolytes/Nutrition: Routine in and outs with follow-up chemistries 8.  History of Hypertension with current hypotensive and hypertensive readings: decrease norvasc to 2.5mg , d/c Coreg, continue lisinopril 40 mg daily.  Monitor with increased mobility. BP goal SBP 130-150. Provided list of foods to help with  blood pressure.  Vitals:   07/31/22 0532 07/31/22 0535  BP: (!) 132/59 118/69  Pulse: 73 83  Resp: 16   Temp: 98.2 F (36.8 C)   SpO2: 97% 100%    9.  Diabetes mellitus with peripheral neuropathy.  Hemoglobin A1c 6.5.  Glucophage 500 mg twice daily, Glucotrol 5 mg daily. Monitor closely as he is on prednisone.Change glipizide to with supper.  CBG (last 3)  Recent Labs    07/30/22 1656  07/30/22 2121 07/31/22 0738  GLUCAP 99 89 171*   Hold metformin given worsening creatinine. Discussed with wife 10. Hyperlipidemia.  Continue Lipitor 80mg  daily 11. CKD2. Cr uptrending- will place nursing order to encourage 6-8 glasses of water per day. Hold metformin since Creatinine is worsening.  12. Dementia. Continue Aricept and Namenda  13. Gout L knee. Prednisone 20mg  for 5 days.(Completed) add volatren gel 14. Vasovagal episode: d/c Coreg. Decrease amlodipine to 5mg . Discussed with wife.  15. Bradycardia: d/c Coreg. Resolved.   LOS: 7 days A FACE TO FACE EVALUATION WAS PERFORMED  Yen Wandell P Takeesha Isley 07/31/2022, 11:08 AM

## 2022-07-31 NOTE — Discharge Summary (Signed)
Physician Discharge Summary  Patient ID: Mitchell Washington MRN: [id number] DOB/AGE: 04-19-1951 71 y.o.  Admit date: 07/24/2022 Discharge date: 08/02/2022  Discharge Diagnoses:  Principal Problem:   Posterior circulation stroke [facility]) DVT prophylaxis Memory loss Hypertension Diabetes mellitus Hyperlipidemia CKD stage II Bradycardia Vasovagal episode Gout  Discharged Condition: Stable  Significant Diagnostic Studies: DG Knee Left Port  Result Date: 07/24/2022 CLINICAL DATA:  Pain.  No injury. EXAM: PORTABLE LEFT KNEE - 1-2 VIEW COMPARISON:  None Available. FINDINGS: No fracture. No dislocation. There are bidirectional enthesopathic changes of the patella. Vascular calcifications are present. No radiopaque foreign body. No effusion. IMPRESSION: Patellofemoral compartment predominant degenerative change. No fracture or dislocation. Electronically Signed   By: Lorenza Cambridge M.D.   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 Previous history of bilateral occipital infarcts. EXAM: PTA INTRACRANIAL COMPARISON:  Recent CT angiogram of the head and neck, and MRI of the brain. MEDICATIONS: Heparin 4,000 units IV. Ancef 2 g IV antibiotic was administered within 1 hour of the procedure. ANESTHESIA/SEDATION: General anesthesia. CONTRAST:  Omnipaque 300 approximately 80 cc. FLUOROSCOPY TIME:  FLUOROSCOPY TIME Fluoroscopy Time: 29 minutes 24 seconds (2813 mGy). COMPLICATIONS: None immediate. TECHNIQUE: Informed written consent was obtained from the patient after a thorough discussion of the procedural risks, benefits and alternatives. All questions were addressed. Maximal Sterile Barrier Technique was utilized including caps, mask, sterile gowns, sterile gloves, sterile drape, hand hygiene and skin antiseptic. A  timeout was performed prior to the initiation of the procedure. The right groin was prepped and draped in the usual sterile fashion. Thereafter using modified Seldinger technique, transfemoral access into the right common femoral artery was obtained without difficulty. Over a 0.035 inch guidewire, an 8 French 15 cm Pinnacle sheath was inserted. Through this, and also over 0.035 inch guidewire, a 5 [place] JB 1 catheter was advanced to the aortic arch region and selectively positioned in the distal left subclavian artery. FINDINGS: The left vertebral artery origin demonstrates approximately 30% stenosis at its origin. More distally, the vessel is seen to opacify to the cranial skull base. The left vertebrobasilar junction proximal just distal to the left posterior-inferior cerebellar artery demonstrates minimal stenosis. Severe focal stenosis again demonstrated of the distal left vertebrobasilar junction at its confluence with the proximal basilar artery. Distal to this the proximal basilar artery appears patent to the level of the anterior-inferior cerebellar arteries. Distal to this there is a severe pre occlusive stenosis of the mid basilar artery. Distal to this the basilar artery caliber improves to near normal. Distal focal narrowing is seen of the distal basilar artery at the level of the superior cerebellar arteries. There is incomplete irregular opacification of the proximal P1 segments secondary to severe intracranial arteriosclerosis. Stenosis is noted focally at the origin of the left superior cerebellar artery, and the left anterior-inferior cerebellar artery. Partial retrograde opacification of the right vertebrobasilar junction is noted to the level of the right posterior-inferior cerebellar artery. High-grade stenosis is again demonstrated of the distal right vertebrobasilar junction proximal to the basilar artery. ENDOVASCULAR REVASCULARIZATION OF THE MID BASILAR ARTERY STENOSIS AND OF THE LEFT  VERTEBROBASILAR JUNCTION AT THE LEVEL OF THE PROXIMAL BASILAR ARTERY WITH BALLOON ANGIOPLASTY. The JB 1 diagnostic catheter in the distal left subclavian artery was then exchanged over a 0.035 inch 300 cm Rosen exchange guidewire for  a 95 cm 071 Benchmark guide catheter with a 125 cm Bernstein catheter. The guidewire was removed. Good aspiration was obtained at the hub of the [place] support catheter. An 035 inch Roadrunner guidewire was then advanced through the support catheter and advanced distally into the vertebral artery followed by the support catheter followed by the Benchmark guide catheter. The guidewire, and the support catheter were removed. Good aspiration was obtained from the hub of the Benchmark guide catheter. A control arteriogram performed through this demonstrated no evidence of spasms, dissections or of intraluminal filling defects. Again demonstrated was the high-grade stenosis of the basilar artery in the left vertebrobasilar junction. Over an 014 inch soft tip micro guidewire, an 044 Phenom 120 cm guide catheter was then advanced distal to the Benchmark guide catheter and positioned in the V4 segment of the left vertebral artery. The guidewire was removed. Gentle control arteriogram performed through the Phenom catheter demonstrated no changes in the intracranial circulation. At this time, a 1.5 mm x 15 mm Gateway angioplasty balloon catheter which had been prepped with 75% contrast and 25% heparinized saline infusion was then advanced over an 014 inch Aristotle soft tip micro guidewire to the distal end of the Phenom catheter. The guidewire was then gently manipulated without any difficulty through the left vertebrobasilar stenosis and the tight mid basilar artery stenosis of the distal basilar artery. Balloon was then advanced to the position initially across the mid basilar artery stenosis and then the proximal left vertebrobasilar artery junction stenosis. At each site, balloon was  inflated using micro inflation syringe device via micro tubing in a progressively slow fashion to approximately 5 atmospheres on both sides. This was maintained for approximately 45 seconds. The balloon was then deflated and retrieved proximally with the wire being maintained distally. A control arteriogram performed through the Phenom catheter demonstrated significantly improved caliber and flow through the both sides of severe stenosis. There was now improved flow in the superior cerebellar arteries with no significant change in the proximal P1 segments. Given the appreciable significant waist at the sites of the 2 angioplasties, a Gateway 2 mm x 15 mm angioplasty microcatheter again which had been prepped as mentioned above was advanced over an 014 inch micro guidewire with a moderate J configuration to the distal end of the Phenom 44 catheter which was now positioned in the mid left vertebrobasilar junction. The 2 angioplastied segments were then crossed without any difficulty into the distal basilar artery. Control inflation was then performed using micro inflation syringe device via micro tubing with the balloon being inflated to 5 atmospheres just at 2 mm. Both sides of the balloon was then obtained for approximately 45 seconds. Following deflation and removal of the balloon, control arteriogram was performed significantly improved caliber and flow through the angioplastied segment. There was now patency of approximately 55% to 60% on both the AP and lateral projections at both sites. Follow-up arteriogram performed at 15 and 30 minutes post angioplasty at both sites continued to demonstrate excellent flow through both the sites without change in the caliber without any rebound phenomenon evident. The angioplasty Gateway balloon catheter was then gently retrieved and removed with the wire. A control arteriogram performed through the Benchmark guide catheter in the left vertebral artery continued to  demonstrate the patency of the extracranial and intracranial left vertebral artery including the previously noted branches. The angioplastied segments at the mid basilar artery, and the distal left vertebrobasilar to be maintained. The Benchmark guide catheter was retrieved  and removed. An 8 French Angio-Seal closure device was then deployed for hemostasis at the right groin puncture site. Distal pulses continued to be Dopplerable in both feet unchanged from prior to procedure. A flat panel CT of the brain demonstrated no evidence of intracranial hemorrhage, or hydrocephalus. Patient's general anesthesia was then reversed, and patient extubated. Upon recovery, the patient responded to name turning, and also frequent movement of his right arm and leg freely. Pupils were 2-3 mm regular round and unchanged. Over the next few minutes, the patient's sodium continued to improve where the patient was more responsive and appropriate. Continued to have difficulty with dysarthria. Now significantly more movement was noted of the left arm and leg. Patient was then transferred to the PACU and then subsequently the neuro ICU. The next morning, the patient had improved significantly where he was more promptly and readily responsive. His speech was returning to where he was able to make phrases appropriately. His left facial droop appeared less prominent. No evidence of nystagmus diplopia was evident on examination. The patient was able to hold his left upper extremity against gravity to a count of 10. He was able to bend his left leg at the knee and able to keep his left leg off the bed. The right groin appeared soft without any tenderness, or of palpable hematoma. 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 PLAN: Patient to be seen in  the clinic 1 month post discharge with MRI and MRA of the brain. Electronically Signed   By: Julieanne Cotton M.D.   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 Previous history of bilateral occipital infarcts. EXAM: PTA INTRACRANIAL COMPARISON:  Recent CT angiogram of the head and neck, and MRI of the brain. MEDICATIONS: Heparin 4,000 units IV. Ancef 2 g IV antibiotic was administered within 1 hour of the procedure. ANESTHESIA/SEDATION: General anesthesia. CONTRAST:  Omnipaque 300 approximately 80 cc. FLUOROSCOPY TIME:  FLUOROSCOPY TIME Fluoroscopy Time: 29 minutes 24 seconds (2813 mGy). COMPLICATIONS: None immediate. TECHNIQUE: Informed written consent was obtained from the patient after a thorough discussion of the procedural risks, benefits and alternatives. All questions were addressed. Maximal Sterile Barrier Technique was utilized including caps, mask, sterile gowns, sterile gloves, sterile drape, hand hygiene and skin antiseptic. A timeout was performed prior to the initiation of the procedure. The right groin was prepped and draped in the usual sterile fashion. Thereafter using modified Seldinger technique, transfemoral access into the right common femoral artery was obtained without difficulty. Over a 0.035 inch guidewire, an 8 French 15 cm Pinnacle sheath was inserted. Through this, and also over 0.035 inch guidewire, a 5 [place] JB 1 catheter was advanced to the aortic arch region and selectively positioned in the distal left subclavian artery. FINDINGS: The left vertebral artery origin demonstrates approximately 30% stenosis at its origin. More distally, the vessel is seen to opacify to the cranial skull base. The left vertebrobasilar junction proximal just distal to the left posterior-inferior cerebellar artery  demonstrates minimal stenosis. Severe focal stenosis again demonstrated of the distal left vertebrobasilar junction at its confluence with the proximal basilar artery. Distal to this the proximal basilar artery appears patent to the level of the  anterior-inferior cerebellar arteries. Distal to this there is a severe pre occlusive stenosis of the mid basilar artery. Distal to this the basilar artery caliber improves to near normal. Distal focal narrowing is seen of the distal basilar artery at the level of the superior cerebellar arteries. There is incomplete irregular opacification of the proximal P1 segments secondary to severe intracranial arteriosclerosis. Stenosis is noted focally at the origin of the left superior cerebellar artery, and the left anterior-inferior cerebellar artery. Partial retrograde opacification of the right vertebrobasilar junction is noted to the level of the right posterior-inferior cerebellar artery. High-grade stenosis is again demonstrated of the distal right vertebrobasilar junction proximal to the basilar artery. ENDOVASCULAR REVASCULARIZATION OF THE MID BASILAR ARTERY STENOSIS AND OF THE LEFT VERTEBROBASILAR JUNCTION AT THE LEVEL OF THE PROXIMAL BASILAR ARTERY WITH BALLOON ANGIOPLASTY. The JB 1 diagnostic catheter in the distal left subclavian artery was then exchanged over a 0.035 inch 300 cm Rosen exchange guidewire for a 95 cm 071 Benchmark guide catheter with a 125 cm Bernstein catheter. The guidewire was removed. Good aspiration was obtained at the hub of the [place] support catheter. An 035 inch Roadrunner guidewire was then advanced through the support catheter and advanced distally into the vertebral artery followed by the support catheter followed by the Benchmark guide catheter. The guidewire, and the support catheter were removed. Good aspiration was obtained from the hub of the Benchmark guide catheter. A control arteriogram performed through this demonstrated no  evidence of spasms, dissections or of intraluminal filling defects. Again demonstrated was the high-grade stenosis of the basilar artery in the left vertebrobasilar junction. Over an 014 inch soft tip micro guidewire, an 044 Phenom 120 cm guide catheter was then advanced distal to the Benchmark guide catheter and positioned in the V4 segment of the left vertebral artery. The guidewire was removed. Gentle control arteriogram performed through the Phenom catheter demonstrated no changes in the intracranial circulation. At this time, a 1.5 mm x 15 mm Gateway angioplasty balloon catheter which had been prepped with 75% contrast and 25% heparinized saline infusion was then advanced over an 014 inch Aristotle soft tip micro guidewire to the distal end of the Phenom catheter. The guidewire was then gently manipulated without any difficulty through the left vertebrobasilar stenosis and the tight mid basilar artery stenosis of the distal basilar artery. Balloon was then advanced to the position initially across the mid basilar artery stenosis and then the proximal left vertebrobasilar artery junction stenosis. At each site, balloon was inflated using micro inflation syringe device via micro tubing in a progressively slow fashion to approximately 5 atmospheres on both sides. This was maintained for approximately 45 seconds. The balloon was then deflated and retrieved proximally with the wire being maintained distally. A control arteriogram performed through the Phenom catheter demonstrated significantly improved caliber and flow through the both sides of severe stenosis. There was now improved flow in the superior cerebellar arteries with no significant change in the proximal P1 segments. Given the appreciable significant waist at the sites of the 2 angioplasties, a Gateway 2 mm x 15 mm angioplasty microcatheter again which had been prepped as mentioned above was advanced over an 014 inch micro guidewire with a moderate J  configuration to the distal end of the Phenom 44 catheter which was now positioned in the mid left vertebrobasilar junction. The 2 angioplastied segments were then crossed without any difficulty into the distal basilar artery. Control inflation was then performed using micro inflation syringe device via  micro tubing with the balloon being inflated to 5 atmospheres just at 2 mm. Both sides of the balloon was then obtained for approximately 45 seconds. Following deflation and removal of the balloon, control arteriogram was performed significantly improved caliber and flow through the angioplastied segment. There was now patency of approximately 55% to 60% on both the AP and lateral projections at both sites. Follow-up arteriogram performed at 15 and 30 minutes post angioplasty at both sites continued to demonstrate excellent flow through both the sites without change in the caliber without any rebound phenomenon evident. The angioplasty Gateway balloon catheter was then gently retrieved and removed with the wire. A control arteriogram performed through the Benchmark guide catheter in the left vertebral artery continued to demonstrate the patency of the extracranial and intracranial left vertebral artery including the previously noted branches. The angioplastied segments at the mid basilar artery, and the distal left vertebrobasilar to be maintained. The Benchmark guide catheter was retrieved and removed. An 8 French Angio-Seal closure device was then deployed for hemostasis at the right groin puncture site. Distal pulses continued to be Dopplerable in both feet unchanged from prior to procedure. A flat panel CT of the brain demonstrated no evidence of intracranial hemorrhage, or hydrocephalus. Patient's general anesthesia was then reversed, and patient extubated. Upon recovery, the patient responded to name turning, and also frequent movement of his right arm and leg freely. Pupils were 2-3 mm regular round and  unchanged. Over the next few minutes, the patient's sodium continued to improve where the patient was more responsive and appropriate. Continued to have difficulty with dysarthria. Now significantly more movement was noted of the left arm and leg. Patient was then transferred to the PACU and then subsequently the neuro ICU. The next morning, the patient had improved significantly where he was more promptly and readily responsive. His speech was returning to where he was able to make phrases appropriately. His left facial droop appeared less prominent. No evidence of nystagmus diplopia was evident on examination. The patient was able to hold his left upper extremity against gravity to a count of 10. He was able to bend his left leg at the knee and able to keep his left leg off the bed. The right groin appeared soft without any tenderness, or of palpable hematoma. 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 PLAN: Patient to be seen in the clinic 1 month post discharge with MRI and MRA of the brain. Electronically Signed   By: Julieanne Cotton M.D.   On: 07/22/2022 11:36   MR ANGIO HEAD WO CONTRAST  Result Date: 07/19/2022 CLINICAL DATA:  Stroke follow-up EXAM: MRI HEAD WITHOUT CONTRAST MRA HEAD WITHOUT CONTRAST TECHNIQUE: Multiplanar, multi-echo pulse sequences of the brain and surrounding structures were acquired without intravenous contrast. Angiographic images of the Circle of Willis were acquired using MRA technique without intravenous contrast. 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 These are all small. 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 There is multifocal hyperintense T2-weighted signal within the white matter. Generalized volume loss. 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 The midline structures are normal. Vascular: Major flow voids are preserved. Skull and upper cervical spine: Normal calvarium and  skull base. Visualized upper cervical spine and soft tissues are normal. Sinuses/Orbits:No paranasal sinus fluid levels or advanced mucosal thickening. No mastoid or middle ear effusion. Normal orbits. MRA HEAD FINDINGS POSTERIOR CIRCULATION: --Vertebral arteries: Normal --Inferior cerebellar arteries: Normal. --Basilar artery: Normal. --Superior cerebellar arteries: Normal. --Posterior cerebral arteries: Unchanged bilateral P1 occlusion. ANTERIOR CIRCULATION: --Intracranial internal carotid arteries: Normal. --Anterior cerebral arteries (ACA): Normal. Hypoplastic left A1 segment, normal variant --Middle cerebral arteries (MCA): Mild atherosclerotic irregularity of both M1 segments. ANATOMIC VARIANTS: None IMPRESSION: 1. Multiple new, small acute infarcts within both cerebellar hemispheres, right greater than left. 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 2. Unchanged bilateral P1 occlusions. No new intracranial occlusion. Electronically Signed   By: Deatra Robinson M.D.   On: 07/19/2022 01:43   MR BRAIN WO CONTRAST  Result Date: 07/19/2022 CLINICAL DATA:  Stroke follow-up EXAM: MRI HEAD WITHOUT CONTRAST MRA HEAD WITHOUT CONTRAST TECHNIQUE: Multiplanar, multi-echo pulse sequences of the brain and surrounding structures were acquired without intravenous contrast. Angiographic images of the Circle of Willis were acquired using MRA technique without intravenous contrast. 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 These are all  small. 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 There is multifocal hyperintense T2-weighted signal within the white matter. Generalized volume loss. 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 The midline structures are normal. Vascular: Major flow voids are preserved. Skull and upper cervical spine: Normal calvarium and skull base. Visualized upper cervical spine and soft tissues are normal. Sinuses/Orbits:No paranasal sinus fluid levels or advanced mucosal thickening. No mastoid or middle ear effusion. Normal orbits. MRA HEAD FINDINGS POSTERIOR CIRCULATION: --Vertebral arteries: Normal --Inferior cerebellar arteries: Normal. --Basilar artery: Normal. --Superior cerebellar arteries: Normal. --Posterior cerebral arteries: Unchanged bilateral P1 occlusion. ANTERIOR CIRCULATION: --Intracranial internal carotid arteries: Normal. --Anterior cerebral arteries (ACA): Normal. Hypoplastic left A1 segment, normal variant --Middle cerebral arteries (MCA): Mild atherosclerotic irregularity of both M1 segments. ANATOMIC VARIANTS: None IMPRESSION: 1. Multiple new, small acute infarcts within both cerebellar hemispheres, right greater than left. 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 2. Unchanged bilateral P1 occlusions. No new intracranial occlusion. Electronically Signed   By: Deatra Robinson M.D.   On: 07/19/2022 01:43   IR ANGIO INTRA EXTRACRAN SEL COM CAROTID INNOMINATE BILAT MOD SED  Result Date: 07/17/2022 CLINICAL DATA:  Severe vertebrobasilar ischemic symptoms of dizziness, gait imbalance, and lethargy. 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 EXAM: BILATERAL COMMON CAROTID AND INNOMINATE ANGIOGRAPHY COMPARISON:  CT angiogram of the head and neck July 15, 2022. MEDICATIONS: Heparin 1000 units IV. No antibiotic was administered within 1 hour of the procedure. ANESTHESIA/SEDATION: Versed 1 mg IV; Fentanyl 25 mcg IV  Moderate Sedation Time:  27 minutes The patient was continuously monitored during the procedure by the interventional radiology nurse under my direct supervision. CONTRAST:  Omnipaque 300 approximately 65 mL. FLUOROSCOPY TIME:  Fluoroscopy Time: 8 minutes 0 seconds (1080 mGy). COMPLICATIONS: None immediate. TECHNIQUE: Informed written consent was obtained from the patient after a thorough discussion of the procedural risks, benefits and alternatives. All questions were addressed. Maximal Sterile Barrier Technique was utilized including caps, mask, sterile gowns, sterile gloves, sterile drape, hand hygiene and skin antiseptic. A timeout was performed prior to the initiation of the procedure. The right groin was prepped and draped in the usual  sterile fashion. Thereafter using modified Seldinger technique, transfemoral access into the right common femoral artery was obtained without difficulty. Over an 0.035 inch guidewire, a 5 French Pinnacle sheath was inserted. Through this, and also over an 0.035 inch guidewire, a 5 [place] JB 1 catheter was advanced to the aortic arch region and selectively positioned in the right common carotid artery, the right vertebral artery, the left common carotid artery and the left vertebral artery. FINDINGS: The innominate arteriogram demonstrates the right subclavian artery proximally in the right common carotid artery to be patent proximally. The non dominant right vertebral artery has a 30% stenosis at its origin. More distally, the vessel is seen to ascend to the cranial skull base. Patency is seen of the right vertebrobasilar junction with a mild-to-moderate tapered stenosis of the distal right vertebrobasilar junction. Severe decrease in caliber is seen of the proximal basilar artery, and in the mid basilar artery. Contrast is noted in the distal basilar artery, the superior cerebellar artery and transiently the anterior cerebral arteries proximally. The right common carotid  arteriogram demonstrates the right external carotid artery and its major branches to be widely patent. The right internal carotid artery stenosis proximally with a small ulcerated plaque along the medial wall of the bulb. More distally, the right internal carotid artery opacifies normally to the cranial skull base. The petrous and the cavernous segments are widely patent. There is a 50-60% stenosis of the right internal carotid artery supraclinoid segment. The right middle cerebral artery demonstrates a 50-60% stenosis in the mid M1 segment with flow noted into the distal distribution. The right anterior cerebral artery opacifies into the capillary and venous phases with prompt cross-filling via the anterior communicating artery of the left anterior cerebral artery A2 segment and distally. The left common carotid arteriogram demonstrates the left external carotid artery and its major branches to be widely patent. The left internal carotid artery at the bulb has a smooth shallow plaque along the posterior wall of the bulb with no significant stenosis by the NASCET criteria. No acute ulcerations are identifiable. More distally, the left internal carotid artery opacifies to the cranial skull base. The petrous and the proximal cavernous segments are widely patent. There is a mild stenosis of the distal cavernous segment with the supraclinoid segment demonstrating near normal patency. The left middle cerebral artery has a mild stenosis in its proximal M1 segment. More distally, the trifurcation branches opacify normally. The left anterior cerebral artery proximal A1 segment demonstrates moderate to severe stenosis with flow noted more distally into the A2 segment. The dominant left vertebral artery has approximately 50% stenosis at its origin. More distally, the vessel is seen to opacify to the cranial skull base. Wide patency is seen of the left vertebrobasilar junction at the level of the left posterior-inferior  cerebellar artery. Severe stenosis is seen of the distal left vertebrobasilar junction extending into the proximal basilar artery. Patency is seen of the superior cerebellar arteries and the anterior-inferior cerebellar arteries. Retrograde opacification is seen of the right vertebrobasilar junction with focal significant stenosis just proximal to the basilar artery. 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 Approximately 50% stenosis of the dominant left vertebral artery at its origin. Approximately 30% stenosis of the non dominant right vertebral artery at its origin, with a moderate to severe stenosis of the distal right vertebrobasilar junction. Approximately 50%-60% stenosis of the right internal carotid  artery supraclinoid segment. Approximately 50-60% stenosis of the right middle cerebral artery mid M1 segment. 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 Electronically Signed   By: Julieanne CottonSanjeev  Deveshwar M.D.   On: 07/17/2022 08:05   IR ANGIO VERTEBRAL SEL VERTEBRAL UNI L MOD SED  Result Date: 07/17/2022 CLINICAL DATA:  Severe vertebrobasilar ischemic symptoms of dizziness, gait imbalance, and lethargy. 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 EXAM: BILATERAL COMMON CAROTID AND INNOMINATE ANGIOGRAPHY COMPARISON:  CT angiogram of the head and neck July 15, 2022. MEDICATIONS: Heparin 1000 units IV. No antibiotic was administered within 1 hour of the procedure. ANESTHESIA/SEDATION: Versed 1 mg IV; Fentanyl 25 mcg IV Moderate Sedation Time:  27 minutes The patient was continuously monitored during the procedure by the interventional radiology nurse  under my direct supervision. CONTRAST:  Omnipaque 300 approximately 65 mL. FLUOROSCOPY TIME:  Fluoroscopy Time: 8 minutes 0 seconds (1080 mGy). COMPLICATIONS: None immediate. TECHNIQUE: Informed written consent was obtained from the patient after a thorough discussion of the procedural risks, benefits and alternatives. All questions were addressed. Maximal Sterile Barrier Technique was utilized including caps, mask, sterile gowns, sterile gloves, sterile drape, hand hygiene and skin antiseptic. A timeout was performed prior to the initiation of the procedure. The right groin was prepped and draped in the usual sterile fashion. Thereafter using modified Seldinger technique, transfemoral access into the right common femoral artery was obtained without difficulty. Over an 0.035 inch guidewire, a 5 French Pinnacle sheath was inserted. Through this, and also over an 0.035 inch guidewire, a 5 [place] JB 1 catheter was advanced to the aortic arch region and selectively positioned in the right common carotid artery, the right vertebral artery, the left common carotid artery and the left vertebral artery. FINDINGS: The innominate arteriogram demonstrates the right subclavian artery proximally in the right common carotid artery to be patent proximally. The non dominant right vertebral artery has a 30% stenosis at its origin. More distally, the vessel is seen to ascend to the cranial skull base. Patency is seen of the right vertebrobasilar junction with a mild-to-moderate tapered stenosis of the distal right vertebrobasilar junction. Severe decrease in caliber is seen of the proximal basilar artery, and in the mid basilar artery. Contrast is noted in the distal basilar artery, the superior cerebellar artery and transiently the anterior cerebral arteries proximally. The right common carotid arteriogram demonstrates the right external carotid artery and its major branches to be widely patent. The right internal carotid artery  stenosis proximally with a small ulcerated plaque along the medial wall of the bulb. More distally, the right internal carotid artery opacifies normally to the cranial skull base. The petrous and the cavernous segments are widely patent. There is a 50-60% stenosis of the right internal carotid artery supraclinoid segment. The right middle cerebral artery demonstrates a 50-60% stenosis in the mid M1 segment with flow noted into the distal distribution. The right anterior cerebral artery opacifies into the capillary and venous phases with prompt cross-filling via the anterior communicating artery of the left anterior cerebral artery A2 segment and distally. The left common carotid arteriogram demonstrates the left external carotid artery and its major branches to be widely patent. The left internal carotid artery at the bulb has a smooth shallow plaque along the posterior wall of  the bulb with no significant stenosis by the NASCET criteria. No acute ulcerations are identifiable. More distally, the left internal carotid artery opacifies to the cranial skull base. The petrous and the proximal cavernous segments are widely patent. There is a mild stenosis of the distal cavernous segment with the supraclinoid segment demonstrating near normal patency. The left middle cerebral artery has a mild stenosis in its proximal M1 segment. More distally, the trifurcation branches opacify normally. The left anterior cerebral artery proximal A1 segment demonstrates moderate to severe stenosis with flow noted more distally into the A2 segment. The dominant left vertebral artery has approximately 50% stenosis at its origin. More distally, the vessel is seen to opacify to the cranial skull base. Wide patency is seen of the left vertebrobasilar junction at the level of the left posterior-inferior cerebellar artery. Severe stenosis is seen of the distal left vertebrobasilar junction extending into the proximal basilar artery. Patency is  seen of the superior cerebellar arteries and the anterior-inferior cerebellar arteries. Retrograde opacification is seen of the right vertebrobasilar junction with focal significant stenosis just proximal to the basilar artery. 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 Approximately 50% stenosis of the dominant left vertebral artery at its origin. Approximately 30% stenosis of the non dominant right vertebral artery at its origin, with a moderate to severe stenosis of the distal right vertebrobasilar junction. Approximately 50%-60% stenosis of the right internal carotid artery supraclinoid segment. Approximately 50-60% stenosis of the right middle cerebral artery mid M1 segment. 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 Electronically Signed   By: Julieanne Cotton M.D.   On: 07/17/2022 08:05   IR ANGIO VERTEBRAL SEL SUBCLAVIAN INNOMINATE UNI R MOD SED  Result Date: 07/17/2022 CLINICAL DATA:  Severe vertebrobasilar ischemic symptoms of dizziness, gait imbalance, and lethargy. 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 EXAM: BILATERAL COMMON CAROTID AND INNOMINATE ANGIOGRAPHY COMPARISON:  CT angiogram of the head and neck July 15, 2022. MEDICATIONS: Heparin 1000 units IV. No antibiotic was administered within 1 hour of the procedure. ANESTHESIA/SEDATION: Versed 1 mg IV; Fentanyl 25 mcg IV Moderate Sedation Time:  27 minutes The patient was continuously monitored during the procedure by the interventional radiology nurse under my direct supervision. CONTRAST:  Omnipaque 300 approximately 65 mL. FLUOROSCOPY TIME:  Fluoroscopy Time: 8 minutes 0 seconds  (1080 mGy). COMPLICATIONS: None immediate. TECHNIQUE: Informed written consent was obtained from the patient after a thorough discussion of the procedural risks, benefits and alternatives. All questions were addressed. Maximal Sterile Barrier Technique was utilized including caps, mask, sterile gowns, sterile gloves, sterile drape, hand hygiene and skin antiseptic. A timeout was performed prior to the initiation of the procedure. The right groin was prepped and draped in the usual sterile fashion. Thereafter using modified Seldinger technique, transfemoral access into the right common femoral artery was obtained without difficulty. Over an 0.035 inch guidewire, a 5 French Pinnacle sheath was inserted. Through this, and also over an 0.035 inch guidewire, a 5 [place] JB 1 catheter was advanced to the aortic arch region and selectively positioned in the right common carotid artery, the right vertebral artery, the left common carotid artery and the left vertebral artery. FINDINGS: The innominate  arteriogram demonstrates the right subclavian artery proximally in the right common carotid artery to be patent proximally. The non dominant right vertebral artery has a 30% stenosis at its origin. More distally, the vessel is seen to ascend to the cranial skull base. Patency is seen of the right vertebrobasilar junction with a mild-to-moderate tapered stenosis of the distal right vertebrobasilar junction. Severe decrease in caliber is seen of the proximal basilar artery, and in the mid basilar artery. Contrast is noted in the distal basilar artery, the superior cerebellar artery and transiently the anterior cerebral arteries proximally. The right common carotid arteriogram demonstrates the right external carotid artery and its major branches to be widely patent. The right internal carotid artery stenosis proximally with a small ulcerated plaque along the medial wall of the bulb. More distally, the right internal carotid artery  opacifies normally to the cranial skull base. The petrous and the cavernous segments are widely patent. There is a 50-60% stenosis of the right internal carotid artery supraclinoid segment. The right middle cerebral artery demonstrates a 50-60% stenosis in the mid M1 segment with flow noted into the distal distribution. The right anterior cerebral artery opacifies into the capillary and venous phases with prompt cross-filling via the anterior communicating artery of the left anterior cerebral artery A2 segment and distally. The left common carotid arteriogram demonstrates the left external carotid artery and its major branches to be widely patent. The left internal carotid artery at the bulb has a smooth shallow plaque along the posterior wall of the bulb with no significant stenosis by the NASCET criteria. No acute ulcerations are identifiable. More distally, the left internal carotid artery opacifies to the cranial skull base. The petrous and the proximal cavernous segments are widely patent. There is a mild stenosis of the distal cavernous segment with the supraclinoid segment demonstrating near normal patency. The left middle cerebral artery has a mild stenosis in its proximal M1 segment. More distally, the trifurcation branches opacify normally. The left anterior cerebral artery proximal A1 segment demonstrates moderate to severe stenosis with flow noted more distally into the A2 segment. The dominant left vertebral artery has approximately 50% stenosis at its origin. More distally, the vessel is seen to opacify to the cranial skull base. Wide patency is seen of the left vertebrobasilar junction at the level of the left posterior-inferior cerebellar artery. Severe stenosis is seen of the distal left vertebrobasilar junction extending into the proximal basilar artery. Patency is seen of the superior cerebellar arteries and the anterior-inferior cerebellar arteries. Retrograde opacification is seen of the right  vertebrobasilar junction with focal significant stenosis just proximal to the basilar artery. 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 Approximately 50% stenosis of the dominant left vertebral artery at its origin. Approximately 30% stenosis of the non dominant right vertebral artery at its origin, with a moderate to severe stenosis of the distal right vertebrobasilar junction. Approximately 50%-60% stenosis of the right internal carotid artery supraclinoid segment. Approximately 50-60% stenosis of the right middle cerebral artery mid M1 segment. 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 Electronically Signed   By: Julieanne Cotton M.D.   On: 07/17/2022 08:05   MR BRAIN WO CONTRAST  Result Date: 07/16/2022 CLINICAL DATA:  Stroke follow-up EXAM:  MRI HEAD WITHOUT CONTRAST TECHNIQUE: Multiplanar, multiecho pulse sequences of the brain and surrounding structures were obtained without intravenous contrast. Four sequences were performed COMPARISON:  Head CT 07/15/2022 FINDINGS: There is a small acute infarct of the right paramedian ventral pons. No other area of acute ischemia. There are old bilateral occipital infarcts and an old left lentiform nucleus infarct. There is multifocal periventricular white matter hyperintensity, most often a result of chronic microvascular ischemia. No acute hemorrhage. IMPRESSION: Small acute infarct of the right paramedian ventral pons. Electronically Signed   By: Deatra Robinson M.D.   On: 07/16/2022 22:06   ECHOCARDIOGRAM COMPLETE  Result Date: 07/16/2022    ECHOCARDIOGRAM REPORT   Patient Name:   Mitchell Washington Date of Exam: 07/16/2022 Medical Rec #:  [id number]         Height:       69.0 in Accession #:    [id number]       Weight:       179.2 lb Date of Birth:  05-17-1951         BSA:          1.972 m Patient Age:    71 years         BP:           151/71 mmHg Patient Gender: M                HR:           50 bpm. Exam Location:  Inpatient Procedure: 2D Echo, Cardiac Doppler and Color Doppler Indications:    TIA G45.9  History:        Patient has no prior history of Echocardiogram examinations.                 Stroke; Risk Factors:Hypertension, Dyslipidemia and Diabetes.  Sonographer:    Eulah Pont RDCS Referring Phys: [id number] SARA-MAIZ A THOMAS IMPRESSIONS  1. Left ventricular ejection fraction, by estimation, is 60 to 65%. The left ventricle has normal function. The left ventricle has no regional wall motion abnormalities. Left ventricular diastolic parameters are indeterminate.  2. Right ventricular systolic function is normal. The right ventricular size is normal. Tricuspid regurgitation signal is inadequate for assessing PA pressure.  3. Left atrial size was mildly dilated.  4. The mitral valve is normal in structure. Trivial mitral valve regurgitation. No evidence of mitral stenosis.  5. The aortic valve is tricuspid. There is mild thickening of the aortic valve. Aortic valve regurgitation is not visualized. Aortic valve sclerosis is present, with no evidence of aortic valve stenosis.  6. The inferior vena cava is normal in size with greater than 50% respiratory variability, suggesting right atrial pressure of 3 mmHg. FINDINGS  Left Ventricle: Left ventricular ejection fraction, by estimation, is 60 to 65%. The left ventricle has normal function. The left ventricle has no regional wall motion abnormalities. The left ventricular internal cavity size was normal in size. There is  no left ventricular hypertrophy. Left ventricular diastolic parameters are indeterminate. Right Ventricle: The right ventricular size is normal. No increase in right ventricular wall thickness.  Right ventricular systolic function is normal. Tricuspid regurgitation signal is inadequate for assessing PA pressure. Left Atrium: Left atrial size was mildly dilated. Right Atrium: Right atrial size was normal in size. Pericardium: There is no evidence of pericardial effusion. Mitral Valve: The mitral valve is normal in structure. Mild mitral annular calcification. Trivial mitral valve regurgitation. No evidence of mitral valve stenosis. Tricuspid Valve: The  tricuspid valve is normal in structure. Tricuspid valve regurgitation is not demonstrated. Aortic Valve: The aortic valve is tricuspid. There is mild thickening of the aortic valve. Aortic valve regurgitation is not visualized. Aortic valve sclerosis is present, with no evidence of aortic valve stenosis. Pulmonic Valve: The pulmonic valve was grossly normal. Pulmonic valve regurgitation is not visualized. Aorta: The aortic root and ascending aorta are structurally normal, with no evidence of dilitation. Venous: The inferior vena cava is normal in size with greater than 50% respiratory variability, suggesting right atrial pressure of 3 mmHg. IAS/Shunts: No atrial level shunt detected by color flow Doppler.  LEFT VENTRICLE PLAX 2D LVIDd:         4.50 cm     Diastology LVIDs:         2.60 cm     LV e' medial:    5.76 cm/s LV PW:         0.90 cm     LV E/e' medial:  14.7 LV IVS:        0.90 cm     LV e' lateral:   8.43 cm/s LVOT diam:     2.00 cm     LV E/e' lateral: 10.1 LV SV:         69 LV SV Index:   35 LVOT Area:     3.14 cm  LV Volumes (MOD) LV vol d, MOD A2C: 76.2 ml LV vol d, MOD A4C: 93.5 ml LV vol s, MOD A2C: 28.6 ml LV vol s, MOD A4C: 36.4 ml LV SV MOD A2C:     47.6 ml LV SV MOD A4C:     93.5 ml LV SV MOD BP:      52.2 ml RIGHT VENTRICLE RV S prime:     11.60 cm/s TAPSE (M-mode): 1.9 cm LEFT ATRIUM             Index        RIGHT ATRIUM           Index LA diam:        3.90 cm 1.98 cm/m   RA Area:     12.70 cm LA Vol (A2C):   26.7 ml 13.54 ml/m  RA  Volume:   24.60 ml  12.47 ml/m LA Vol (A4C):   29.2 ml 14.81 ml/m LA Biplane Vol: 27.9 ml 14.15 ml/m  AORTIC VALVE LVOT Vmax:   88.80 cm/s LVOT Vmean:  55.300 cm/s LVOT VTI:    0.220 m  AORTA Ao Root diam: 2.90 cm Ao Asc diam:  3.30 cm MITRAL VALVE MV Area (PHT): 2.87 cm    SHUNTS MV Decel Time: 264 msec    Systemic VTI:  0.22 m MV E velocity: 84.90 cm/s  Systemic Diam: 2.00 cm MV A velocity: 82.30 cm/s MV E/A ratio:  1.03 Mihai Croitoru MD Electronically signed by Thurmon Fair MD Signature Date/Time: 07/16/2022/12:32:57 PM    Final    MR BRAIN WO CONTRAST  Result Date: 07/15/2022 CLINICAL DATA:  Neuro deficit, acute, stroke suspected. Gait disturbance. Headache. Dementia. EXAM: MRI HEAD WITHOUT CONTRAST TECHNIQUE: Multiplanar, multiecho pulse sequences of the brain and surrounding structures were obtained without intravenous contrast. COMPARISON:  CT studies earlier same day FINDINGS: Brain: Diffusion imaging does not show any acute or subacute infarction. No focal abnormality affects the brainstem. There are numerous old small vessel cerebellar infarctions. Cerebral hemispheres show old infarctions in the PCA territories more extensive on the left than the right with encephalomalacia and gliosis. There  is old infarction in the left basal ganglia and external capsule region. Mild chronic small-vessel ischemic changes seen elsewhere within the hemispheric white matter. Hemosiderin deposition is present in association with the old left basal ganglia/external capsule infarction. No mass lesion, recent hemorrhage, hydrocephalus or extra-axial collection. Vascular: Major vessels at the base of the brain show flow. Skull and upper cervical spine: Negative Sinuses/Orbits: Small amount of mucoid fluid in the left division of the sphenoid sinus. Mild mucosal thickening elsewhere consistent with seasonal rhinitis. Other: None IMPRESSION: 1. No acute or reversible finding. 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 Mild chronic small-vessel ischemic changes elsewhere affecting the brain as outlined above. 2. Mild mucosal thickening and mucoid fluid in the left division of the sphenoid sinus consistent with seasonal rhinitis. Electronically Signed   By: Paulina Fusi M.D.   On: 07/15/2022 12:59   CT ANGIO HEAD NECK W WO CM W PERF (CODE STROKE)  Result Date: 07/15/2022 CLINICAL DATA:  Neuro deficit, acute, stroke suspected. Ataxia, slurred speech, left-sided weakness, and facial droop. EXAM: CT ANGIOGRAPHY HEAD AND NECK CT PERFUSION BRAIN TECHNIQUE: Multidetector CT imaging of the head and neck was performed using the standard protocol during bolus administration of intravenous contrast. Multiplanar CT image reconstructions and MIPs were obtained to evaluate the vascular anatomy. Carotid stenosis measurements (when applicable) are obtained utilizing NASCET criteria, using the distal internal carotid diameter as the denominator. Multiphase CT imaging of the brain was performed following IV bolus contrast injection. Subsequent parametric perfusion maps were calculated using RAPID software. RADIATION DOSE REDUCTION: This exam was performed according to the departmental dose-optimization program which includes automated exposure control, adjustment of the mA and/or kV according to patient size and/or use of iterative reconstruction technique. CONTRAST:  OMNIPAQUE IOHEXOL 350 MG/ML SOLN COMPARISON:  None Available. FINDINGS: CTA NECK FINDINGS Aortic arch: Normal variant aortic arch branching pattern with common origin of the brachiocephalic and left common carotid arteries. Mild atherosclerosis without a significant stenosis of the arch vessel origins. Right carotid system: Patent with a small amount of calcified and soft plaque at the carotid bifurcation. No evidence of a significant stenosis or dissection. Left carotid system: Patent with  a small amount of calcified and soft plaque in the distal common carotid artery. No evidence of a significant stenosis or dissection. Vertebral arteries: Patent with the left being dominant. Plaque at the vertebral origins results in mild stenosis on the right and moderate stenosis on the left. Mild multifocal irregular narrowing of the right V2 segment. Skeleton: Mild cervical spondylosis. Other neck: No evidence of cervical lymphadenopathy or mass. Upper chest: Clear lung apices. Review of the MIP images confirms the above findings CTA HEAD FINDINGS Anterior circulation: The internal carotid arteries are patent from skull base to carotid termini with moderate to severe paraclinoid stenoses bilaterally. ACAs and MCAs are patent with moderate branch vessel atherosclerotic irregularity as well as severe irregular narrowing of the left A1 segment. No aneurysm is identified. Posterior circulation: The intracranial vertebral arteries are patent to the basilar with severe stenoses bilaterally. The basilar artery is patent with a severe stenosis of its proximal to midportion (immediately distal to the right AICA origin) and a moderate stenosis distally. Both PCAs are occluded at the P1 levels. There may be a small right posterior communicating artery, and there is some reconstitution of distal PCA branches primarily on the right. No aneurysm is identified. Venous sinuses: Patent. Anatomic variants: None.  Review of the MIP images confirms the above findings CT Brain Perfusion Findings: ASPECTS: 10 CBF (<30%) Volume: 0 mL Perfusion (Tmax>6.0s) volume: 24 mL, located in both occipital lobes (PCA territories) Mismatch Volume: 24 mL Infarction Location: No acute core infarct identified by CTP. The study was reviewed by telephone with Dr. Wilford Corner on 07/15/2022 at 11:10 a.m. IMPRESSION: 1. Bilateral proximal PCA occlusion. 2. 24 mL of reported penumbra in the occipital lobes which largely corresponds to chronic infarcts. 3.  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 4. Mild cervical carotid artery atherosclerosis without significant stenosis. 5. Moderate left and mild right vertebral artery origin stenoses. 6.  Aortic Atherosclerosis (ICD10-I70.0). Electronically Signed   By: Sebastian Ache M.D.   On: 07/15/2022 11:48   CT HEAD CODE STROKE WO CONTRAST  Result Date: 07/15/2022 CLINICAL DATA:  Code stroke. Neuro deficit, acute, stroke suspected. Ataxia, slurred speech, left-sided weakness, and facial droop. EXAM: CT HEAD WITHOUT CONTRAST TECHNIQUE: Contiguous axial images were obtained from the base of the skull through the vertex without intravenous contrast. RADIATION DOSE REDUCTION: This exam was performed according to the departmental dose-optimization program which includes automated exposure control, adjustment of the mA and/or kV according to patient size and/or use of iterative reconstruction technique. COMPARISON:  Head CT 01/21/2021 FINDINGS: Brain: There is no evidence of an acute infarct, intracranial hemorrhage, mass, midline shift, or extra-axial fluid collection. A chronic left basal ganglia infarct, small right and large left chronic PCA infarcts, and small chronic bilateral cerebellar infarcts were present in 2022. There is ex vacuo dilatation of the left lateral ventricle. Hypodensities elsewhere in the cerebral white matter are unchanged and nonspecific but compatible with mild chronic small vessel ischemic disease. Vascular: Calcified atherosclerosis at the skull base. No hyperdense vessel. Skull: No acute fracture or suspicious osseous lesion. Sinuses/Orbits: Mild mucosal thickening in the paranasal sinuses. Clear mastoid air cells. Bilateral cataract extraction. Other: Mild left parietal scalp scarring. ASPECTS (Alberta Stroke Program Early CT Score) (right MCA territory) - Ganglionic level infarction (caudate,  lentiform nuclei, internal capsule, insula, M1-M3 cortex): 7 - Supraganglionic infarction (M4-M6 cortex): 3 Total score (0-10 with 10 being normal): 10 The study was reviewed by telephone with Dr. Wilford Corner on 07/15/2022 at 11:10 a.m. IMPRESSION: 1. No evidence of acute intracranial abnormality. ASPECTS of 10. 2. 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 Electronically Signed   By: Sebastian Ache M.D.   On: 07/15/2022 11:22   DG Chest Portable 1 View  Result Date: 07/15/2022 CLINICAL DATA:  Weakness EXAM: PORTABLE CHEST 1 VIEW COMPARISON:  Chest radiograph 02/20/2021 FINDINGS: The cardiomediastinal silhouette is normal. There is no focal consolidation or pulmonary edema. There is no pleural effusion or pneumothorax There is no acute osseous abnormality. IMPRESSION: No radiographic evidence of acute cardiopulmonary process. Electronically Signed   By: Lesia Hausen M.D.   On: 07/15/2022 11:19    Labs:  Basic Metabolic Panel: Recent Labs  Lab 07/28/22 1145 07/30/22 0511  NA 137 138  K 4.9 5.2*  CL 103 106  CO2 22 22  GLUCOSE 185* 88  BUN 45* 56*  CREATININE 1.70* 1.86*  CALCIUM 9.1 9.2    CBC: No results for input(s): "WBC", "NEUTROABS", "HGB", "HCT", "MCV", "PLT" in the last 168 hours.   CBG: Recent Labs  Lab 07/31/22 1609 08/01/22 0514 08/01/22 1134 08/01/22 1611 08/01/22 2031  GLUCAP 147* 125* 200*  200* 134* 143*  Family history.  Mother with diabetes.  Denies any colon cancer esophageal cancer or rectal cancer  Brief HPI:   ODIE EDMONDS is a 71 y.o. 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  Per chart review lives with spouse independent prior to admission.  Presented 07/15/2022 with acute onset of left-sided weakness dysarthria and gait disturbance.  Initial cranial  CT scan negative.  MRI of the brain 1128 with small CVA right paramedian ventral pons.  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  Patient underwent arteriogram balloon angioplasty x 2 07/18/2022 per interventional radiology.  Follow-up MRI 07/19/2022 multiple new small acute infarcts within both cerebellar hemispheres right greater than left.  Unchanged appearance of right paramedian pontine infarction.  Echocardiogram ejection fraction of 60 to 65%.  Placed on low-dose aspirin as well as Brilinta twice daily followed by neurology services.  He was cleared to begin subcutaneous heparin for DVT prophylaxis.  Placed on 5-day course of prednisone for left knee gout flareup.  Therapy evaluations completed due to patient decreased functional mobility left-sided weakness was admitted for a comprehensive rehab program.   Hospital Course: Tiara Bartoli Nowaczyk was admitted to rehab 07/24/2022 for inpatient therapies to consist of PT, ST and OT at least three hours five days a week. Past admission physiatrist, therapy team and rehab RN have worked together to provide customized collaborative inpatient rehab.  Pertaining to patient's right paramedian ventral pons infarct as well as small acute infarct both cerebellar hemispheres due to severe posterior circulation stenosis status post basilar artery angioplasty 07/18/2022.  Maintained on low-dose aspirin as well as Brilinta 90 mg twice daily and follow-up neurology services.  Family with concerns over cost of Brilinta which they would address with neurology services as outpatient.  Subcutaneous heparin for DVT prophylaxis no bleeding episodes.  History of memory loss/dementia continue with Aricept as well as Namenda as prior to admission.  Noted hypertension with bouts of hypotension noted as well as 1 episode of vasovagal.  Patient currently on low-dose Norvasc and lisinopril.  His Coreg has been held  due to hypotension as well as mild bradycardia.  Blood sugars overall controlled hemoglobin A1c 6.5 maintained on Glucotrol.  His Glucophage had been held due to noted history of CKD stage II with latest creatinine 1.86.  Patient did have episode of gout pain left knee completing course of prednisone.   Blood pressures were monitored on TID basis and soft and monitored  Diabetes has been monitored with ac/hs CBG checks and SSI was use prn for tighter BS control.    Rehab course: During patient's stay in rehab weekly team conferences were held to monitor patient's progress, set goals and discuss barriers to discharge. At admission, patient required moderate assist lateral scoot transfers max assist sit to stand  Physical exam.  Blood pressure 144/65 pulse 63 temperature 98.8 respirations 16 oxygen saturation is 98% Constitutional.  No acute distress HEENT Head.  Normocephalic and atraumatic Eyes.  Pupils round and reactive to light no discharge without nystagmus Neck.  Supple nontender no JVD without thyromegaly Cardiac regular rate and rhythm without any extra sounds or murmur heard Abdomen.  Soft nontender positive bowel sounds without rebound Respiratory effort normal no respiratory distress without wheeze Extremities.  No clubbing cyanosis or edema Neurologic.  Alert oriented to name  as well as city but not hospital or able to give appropriate month or year.  Makes eye contact with examiner.  Speech is mildly dysarthric but intelligible.  Follows simple commands.  Strength right upper right lower extremity 4+/5 Left upper extremity 3/5 shoulder abduction 4 -/5 elbow flexion 3/5 elbow extension finger flexion 3/5 Left lower extremity 1-2/5 hip flexion and knee extension, 3/5 ankle plantar flexion dorsiflexion sensation intact   He/She  has had improvement in activity tolerance, balance, postural control as well as ability to compensate for deficits. He/She has had improvement in functional  use RUE/LUE  and RLE/LLE as well as improvement in awareness.  Requires supervision for safety and increased time with supine to sit contact-guard.  Requires mod assist for dynamic standing balance.  Requires contact-guard for dynamic reactive standing.  Donned lower body with assistance for clothing.  Ambulates rolling walker contact-guard.  Compensatory speech strategies min mod assist verbal cues.  It was discussed with wife the need for supervision for safety.  Full family teaching completed plan discharge to home       Disposition: Discharge to home    Diet: Diabetic diet  Special Instructions: No driving smoking or alcohol  Currently hold metformin due to chronic renal insufficiency.  Hold Coreg due to hypotension.  Medications at discharge. 1.  Tylenol as needed 2.  Norvasc 2.5 mg p.o. daily 3.  Aspirin 81 mg p.o. daily 4.  Lipitor 80 mg p.o. daily 5.  Vitamin B12 1000 mcg p.o. daily 6.  Voltaren gel 2 g 4 times daily to affected area 7.  Aricept 10 mg p.o. nightly 8.  Glucotrol XL 2.5 mg p.o. daily 9.  Lisinopril 40 mg p.o. daily 10.  Namenda 10 mg p.o. twice daily 11.  Brilinta 90 mg p.o. twice daily   Discharge Instructions     Ambulatory referral to Neurology   Complete by: As directed    An appointment is requested in approximately: 4 weeks posterior circulation infarction   Ambulatory referral to Physical Medicine Rehab   Complete by: As directed    Moderate complexity follow-up 1 to 2 weeks posterior circulation infarction        Follow-up Information     Raulkar, Drema Pry, MD Follow up.   Specialty: Physical Medicine and Rehabilitation Why: Office to call for appointment Contact information: [address] [place] [address] [phone]         Julieanne Cotton, MD Follow up.   Specialties: Interventional Radiology, Radiology Why: Call for appointment Contact information: [address] [place]  [address] [phone]         Kandyce Rud, MD Follow up.   Specialty: Family Medicine Contact information: [address] [facility] [place] [address] [phone]                 Signed: Mcarthur Rossetti Taym Twist 08/02/2022, 5:22 AM

## 2022-07-31 NOTE — Progress Notes (Signed)
Speech Language Pathology Daily Session Note  Patient Details  Name: Mitchell Washington MRN: [id number] Date of Birth: [birth year]  Today's Date: 07/31/2022 SLP Individual Time: 3419-3790 SLP Individual Time Calculation (min): 40 min  Short Term Goals: Week 2: SLP Short Term Goal 1 (Week 2): STG's = LTG's due to ELOS  Skilled Therapeutic Interventions: Pt seen this date for skilled ST intervention targeting speech intelligibility goals outlined in care plan. Pt received awake/alert and lying semi-reclined in bed. Agreeable to intervention at bedside. Flat affect noted for the majority of today's session with decreased eye contact. Repositioned HOB to allow for more upright posture in an effort to aid in improved breath support during therapeutic tasks.   Today's session with emphasis on orientation, awareness, and speech intelligibility within the context of therapeutic and functional communication tasks. SLP began today's session by providing overall Min A visual cues (gesture + written) and providing external aid (e.g. calendar) for pt to demonstrate orientation x 4. Additionally, SLP reviewed ST POC to remind pt re: purpose of ST intervention in the setting of dementia and to facilitate participation. Generalization of novel information remains limited from session to session; therefore, pt continues to benefit from Mod-Max A to verbalize 2 deficits that are impacting his daily functioning s/p CVA. Following a delay with distractions, pt recalled orientation information and current deficits given Min-Mod A verbal cues to include sentence completion prompts, which did appear beneficial in promoting recall of targeted word/phrases (e.g. I had a ______ (stroke)). Pt verbalized compensatory speech strategies given Min-Mod A verbal and visual cues to reference external aid (written cues) assist in recall and to consistently utilize during functional and therapeutic speech tasks. When given Min A verbal cues  to "get loud," pt's speech intelligibility improved from 70% to 85% when reading and verbalizing functional single words and short phrases (e.g. "come here," "help me," "stop it," etc.). Intermittent generalization to simple conversation (close-ended questions) re: self and family noted immediately following mass practice of speech strategies. Pt's wife demonstrated appropriate verbal cueing when pt's intelligibility was reduced.  Pt left in room and in bed with all safety measures activated and call bell within reach. Wife remained at bedside. Encouraged pt and pt's wife to call for assistance for toileting prior to PT session. Continue per current ST POC.  Pain No pain reported; NAD  Therapy/Group: Individual Therapy  Jerald Villalona A Kester Stimpson 07/31/2022, 9:31 AM

## 2022-08-01 ENCOUNTER — Other Ambulatory Visit (HOSPITAL_COMMUNITY): Payer: Self-pay

## 2022-08-01 LAB — GLUCOSE, CAPILLARY
Glucose-Capillary: 125 mg/dL — ABNORMAL HIGH (ref 70–99)
Glucose-Capillary: 200 mg/dL — ABNORMAL HIGH (ref 70–99)
Glucose-Capillary: 200 mg/dL — ABNORMAL HIGH (ref 70–99)
Glucose-Capillary: 134 mg/dL — ABNORMAL HIGH (ref 70–99)
Glucose-Capillary: 143 mg/dL — ABNORMAL HIGH (ref 70–99)

## 2022-08-01 MED ORDER — ACETAMINOPHEN 325 MG PO TABS: 650.0000 mg | ORAL_TABLET | ORAL | Status: DC | PRN

## 2022-08-01 MED ORDER — ATORVASTATIN CALCIUM 80 MG PO TABS
80.0000 mg | ORAL_TABLET | Freq: Every day | ORAL | 0 refills | Status: DC
  Filled 2022-08-01: qty 30, 30d supply, fill #0

## 2022-08-01 MED ORDER — DICLOFENAC SODIUM 1 % EX GEL
2.0000 g | Freq: Four times a day (QID) | CUTANEOUS | 0 refills | Status: DC
  Filled 2022-08-01: qty 100, fill #0

## 2022-08-01 MED ORDER — TICAGRELOR 90 MG PO TABS
90.0000 mg | ORAL_TABLET | Freq: Two times a day (BID) | ORAL | 0 refills | Status: DC
  Filled 2022-08-01: qty 60, 30d supply, fill #0

## 2022-08-01 MED ORDER — LISINOPRIL 40 MG PO TABS
40.0000 mg | ORAL_TABLET | Freq: Every day | ORAL | 0 refills | Status: DC
  Filled 2022-08-01: qty 30, 30d supply, fill #0

## 2022-08-01 MED ORDER — VITAMIN B-12 1000 MCG PO TABS
1000.0000 ug | ORAL_TABLET | Freq: Every day | ORAL | 0 refills | Status: DC
  Filled 2022-08-01: qty 30, 30d supply, fill #0

## 2022-08-01 MED ORDER — AMLODIPINE BESYLATE 2.5 MG PO TABS
2.5000 mg | ORAL_TABLET | Freq: Every day | ORAL | 0 refills | Status: DC
  Filled 2022-08-01: qty 30, 30d supply, fill #0

## 2022-08-01 MED ORDER — GLIPIZIDE ER 2.5 MG PO TB24
2.5000 mg | ORAL_TABLET | Freq: Every day | ORAL | 0 refills | Status: DC
  Filled 2022-08-01: qty 30, 30d supply, fill #0

## 2022-08-01 NOTE — Progress Notes (Signed)
Occupational Therapy Discharge Summary  Patient Details  Name: Mitchell Washington MRN: [id number] Date of Birth: 11-21-1950  Date of Discharge from [place] service:August 01, 2022  Patient has met 7 of 7 long term goals due to improved activity tolerance, improved balance, and improved coordination.  Pt made steady progress with BADLs during this admission. Pt requires max verbal cues for sequencing, safety, and encouragement to actively complete tasks. Pt completes BADLs and functional tranfsers with min A.  Pt with significant cognitive deficits (baseline). Pt 's wife has been active during therapy sessions and has actively participated in pt's care.Patient to discharge at [facility] Assist level.  Patient's care partner is independent to provide the necessary physical and cognitive assistance at discharge.    Reasons goals not met: n/a  Recommendation:  Patient will benefit from ongoing skilled OT services in home health setting to continue to advance functional skills in the area of BADL.  Equipment: BSC  Reasons for discharge: treatment goals met and discharge from hospital  Patient/family agrees with progress made and goals achieved: Yes  OT Discharge ADL ADL Equipment Provided: Reacher, Sock aid, Long-handled shoe horn Eating: Set up Where Assessed-Eating: Wheelchair Grooming: Setup Where Assessed-Grooming: Sitting at sink, Wheelchair Upper Body Bathing: Supervision/safety Where Assessed-Upper Body Bathing: Shower Lower Body Bathing: Minimal assistance Where Assessed-Lower Body Bathing: Chair, Sitting at sink Upper Body Dressing: Supervision/safety Where Assessed-Upper Body Dressing: Sitting at sink, Wheelchair Lower Body Dressing: Moderate assistance Where Assessed-Lower Body Dressing: Sitting at sink, Wheelchair Toileting: Minimal assistance, Maximal cueing Where Assessed-Toileting: Glass blower/designer: Psychiatric nurse Method: Occupational hygienist: Grab bars, Radiographer, therapeutic: Not assessed Social research officer, government: Maximal cueing, Minimal assistance Social research officer, government Method: Heritage manager: Gaffer Baseline Vision/History: 1 Wears glasses Patient Visual Report: No change from baseline Vision Assessment?: No apparent visual deficits Eye Alignment: Within Functional Limits Ocular Range of Motion: Within Functional Limits Alignment/Gaze Preference: Within Defined Limits Tracking/Visual Pursuits: Able to track stimulus in all quads without difficulty;Requires cues, head turns, or add eye shifts to track (min cues for keeping head still, eyes able to track in all directions) Visual Fields: No apparent deficits Perception  Perception: Impaired Inattention/Neglect: Does not attend to left side of body (mild imparement) Praxis Praxis: Impaired Praxis Impairment Details: Motor planning;Perseveration Cognition Cognition Overall Cognitive Status: History of cognitive impairments - at baseline Arousal/Alertness: Awake/alert Orientation Level: Person;Place Memory: Impaired Memory Impairment: Decreased short term memory;Decreased recall of new information;Retrieval deficit;Storage deficit Decreased Short Term Memory: Verbal basic;Functional basic Attention: Focused;Sustained;Selective Focused Attention: Appears intact Sustained Attention: Appears intact Selective Attention: Impaired Selective Attention Impairment: Verbal basic;Functional basic Awareness: Impaired Awareness Impairment: Emergent impairment;Intellectual impairment Problem Solving: Impaired Problem Solving Impairment: Functional basic;Verbal basic Behaviors: Perseveration Safety/Judgment: Appears intact Brief Interview for Mental Status (BIMS) Repetition of Three Words (First Attempt): 3 Temporal Orientation: Year: Missed by 2 to 5 years Temporal Orientation: Month: Accurate within 5  days Temporal Orientation: Day: No answer Recall: "Sock": No, could not recall Recall: "Blue": No, could not recall Recall: "Bed": No, could not recall BIMS Summary Score: 6 Sensation Sensation Light Touch: Appears Intact Hot/Cold: Appears Intact Proprioception: Impaired by gross assessment Stereognosis: Not tested Additional Comments: Pt reports decreased sensation in left hand only. Coordination Gross Motor Movements are Fluid and Coordinated: No Fine Motor Movements are Fluid and Coordinated: No Coordination and Movement Description: left hemi Finger Nose Finger Test: Slow and delayed movement Heel Shin Test: R slow  and deliberate, L unable due to functional weakness Motor  Motor Motor: Hemiplegia;Motor apraxia    Trunk/Postural Assessment  Cervical Assessment Cervical Assessment: Within Functional Limits Thoracic Assessment Thoracic Assessment: Exceptions to [facility] (rounded shoulders) Lumbar Assessment Lumbar Assessment: Exceptions to [facility] (posterior pelvic tilt) Postural Control Postural Control: Deficits on evaluation  Balance Static Sitting Balance Static Sitting - Balance Support: No upper extremity supported;Feet supported Static Sitting - Level of Assistance: 5: Stand by assistance Dynamic Sitting Balance Dynamic Sitting - Balance Support: During functional activity Dynamic Sitting - Level of Assistance: 4: Min assist Extremity/Trunk Assessment RUE Assessment RUE Assessment: Within Functional Limits LUE Assessment LUE Assessment: Exceptions to [facility] Passive Range of Motion (PROM) Comments: Functional P/ROM shoulder, elbow, wrist, hand in all ranges. Active Range of Motion (AROM) Comments: shoulder flexion: 50% range; IR/er: 50% range, functional elbow flexion/extension, functional wrist flexion/extension, full gross fist and full finger extension General Strength Comments: shoulder flexion 3+/5, elbow flexion/extension 4-/5, weak gross grasp   Leroy Libman 08/01/2022, 11:54 AM

## 2022-08-01 NOTE — Progress Notes (Signed)
Physical Therapy Session Note  Patient Details  Name: Mitchell Washington MRN: [id number] Date of Birth: Mar 15, 1951  Today's Date: 08/01/2022 PT Individual Time: 0800-0900 and 1300-1330 PT Individual Time Calculation (min): 60 min and 30 min  Short Term Goals: Week 1:  PT Short Term Goal 1 (Week 1): Pt will complete bed mobility with modA PT Short Term Goal 2 (Week 1): Pt will complete bed<>chair transfers with minA and LRAD PT Short Term Goal 3 (Week 1): Pt will ambulate 52ft with minA and LRAD  Skilled Therapeutic Interventions/Progress Updates:     Session 1: Patient in bed asleep with his wife in the room upon PT arrival. Patient slow to arouse and agreeable to PT session with encouragement. Patient denied pain during session.  Patient's wife reports she has participated in hands on training and feels confident performing all mobility, including stairs, with patient at this time. Declined participating in additional hands on training during session.   Therapeutic Activity: Bed Mobility: Patient performed rolling R/L and supine to/from sit with supervision and increased time and mod cues for technique.  Transfers: Patient performed stand pivot bed<>w/c and sit to/from stand x3 with CGA with RW. Provided verbal cues for hand placement, forward weight shift, and reaching back to sit.  Gait Training:  Patient ambulated >200 feet using RW with CGA. Ambulated with decreased gait speed, decreased step length and height L>R,  decreased L weight shift, forward trunk lean, and downward head gaze. Provided verbal cues for erect posture, looking ahead, heel strike at initial contact for increased step length on L, and increased L hip activation in stance for increased stability with weight shift. Patient ascended/descended 8x6" steps using B rails with CGA + 1x8" stpe using RW with min A. Performed step-to gait pattern with max cues for leading with R while ascending and L while descending.    Patient required increased time for initiation, cuing, rest breaks, and for completion of tasks throughout session. Utilized therapeutic use of self throughout to promote efficiency.   Patient in bed, per patient request, with his wife at bedside at end of session with breaks locked, bed alarm set, and all needs within reach.   Session 2: Patient in bed asleep upon PT arrival. Patient slow to arouse and agreeable to PT session. Patient denied pain during session.  Therapeutic Activity: Bed Mobility: Patient performed supine to/from sit with supervision and increased time as above.  Transfers: Patient performed stand pivot bed<>w/c with CGA, as above. Patient performed a simulated sedan height car transfer with min A for bringing L lower extremity in/out of the car using RW. Provided cues for safe technique and pushing up/reaching back to sit.  Patient ambulated up/down a ramp with CGA using RW. Provided cues for technique and use of AD.  Patient in bed at end of session with breaks locked, bed alarm set, and all needs within reach.   Therapy Documentation Precautions:  Precautions Precautions: Fall Precaution Comments: L hemi, dementia Restrictions Weight Bearing Restrictions: No    Therapy/Group: Individual Therapy  Mitchell Washington PT, DPT, NCS, CBIS  08/01/2022, 1:33 PM

## 2022-08-01 NOTE — Progress Notes (Signed)
Inpatient Rehabilitation Care Coordinator Discharge Note   Patient Details  Name: Mitchell Washington MRN: [id number] Date of Birth: 11/17/1950   Discharge location: HOME WITH WIFE WHO IS ABLE TO PROVIDE 24/7 CARE  Length of Stay: 9 DAYS  Discharge activity level: CGA-MIN LEVEL  Home/community participation: ACTIVE  Patient response VZ:DGLOVF Literacy - How often do you need to have someone help you when you read instructions, pamphlets, or other written material from your doctor or pharmacy?: Patient unable to respond  Patient response IE:PPIRJJ Isolation - How often do you feel lonely or isolated from those around you?: Never  Services provided included: MD, RD, PT, OT, SLP, RN, CM, Pharmacy, SW  Financial Services:  Field seismologist Utilized: Private Insurance [employer] MEDICARE  Choices offered to/list presented to: WIFE AND PT  Follow-up services arranged:  Home Health, DME, Patient/Family has no preference for HH/DME agencies Home Health Agency: [facility]-PT OT SP    DME : [facility]-TRANSPORT CHAIR AND 3 IN1 WIFE TO GET ROLLING WALKER SINCE NOT COVERED    Patient response to transportation need: Is the patient able to respond to transportation needs?: Yes In the past 12 months, has lack of transportation kept you from medical appointments or from getting medications?: No In the past 12 months, has lack of transportation kept you from meetings, work, or from getting things needed for daily living?: No    Comments (or additional information):WIFE STAYED HERE WITH DUE TO PT'S DEMENTIA. PARTICIPATED IN THERAPIES WITH HIM AND FEELS COMFORTABLE WITH HIS CARE NEEDS. FEELS WILL BE MORE ORIENTED AT [facility] ENVIRONMENT  Patient/Family verbalized understanding of follow-up arrangements:  Yes  Individual responsible for coordination of the follow-up plan: [name]  [phone]  Confirmed correct DME delivered: [name] 08/01/2022    [name]

## 2022-08-01 NOTE — Progress Notes (Signed)
Physical Therapy Discharge Summary  Patient Details  Name: Mitchell Washington MRN: [id number] Date of Birth: [birth year]  Date of Discharge from PT service:August 01, 2022  Today's Date: 08/01/2022   Patient has met 8 of 8 long term goals due to improved activity tolerance, improved balance, improved postural control, increased strength, and ability to compensate for deficits.  Patient to discharge at an ambulatory level Min Assist-CGA using RW.   Patient's care partner is independent to provide the necessary physical assistance at discharge.  Reasons goals not met: All PT goals met at this time.   Recommendation:  Patient will benefit from ongoing skilled PT services in home health setting to continue to advance safe functional mobility, address ongoing impairments in balance, activity tolerance, functional mobility, gait and stair training, patient/caregiver education, and minimize fall risk.  Equipment: Systems analyst, RW  Reasons for discharge: treatment goals met  Patient/family agrees with progress made and goals achieved: Yes  PT Discharge Precautions/Restrictions Precautions Precautions: Fall Precaution Comments: L hemi, dementia Restrictions Weight Bearing Restrictions: No Pain Interference Pain Interference Pain Effect on Sleep: 1. Rarely or not at all Pain Interference with Therapy Activities: 1. Rarely or not at all Pain Interference with Day-to-Day Activities: 1. Rarely or not at all Vision/Perception  Vision - History Ability to See in Adequate Light: 0 Adequate Perception Perception: Impaired Praxis Praxis: Impaired Praxis Impairment Details: Motor planning;Perseveration  Cognition Overall Cognitive Status: History of cognitive impairments - at baseline Arousal/Alertness: Awake/alert Orientation Level: Oriented to person;Oriented to place;Oriented to time;Oriented to situation (with min cues for dx and year) Year: 2026 Month: December Attention:  Focused;Sustained;Selective Focused Attention: Appears intact Sustained Attention: Appears intact Selective Attention: Impaired Selective Attention Impairment: Verbal basic;Functional basic Memory: Impaired Memory Impairment: Decreased short term memory;Decreased recall of new information;Retrieval deficit;Storage deficit Decreased Short Term Memory: Verbal basic;Functional basic Awareness: Impaired Awareness Impairment: Emergent impairment;Intellectual impairment Problem Solving: Impaired Problem Solving Impairment: Functional basic;Verbal basic Behaviors: Perseveration Safety/Judgment: Appears intact Sensation Sensation Light Touch: Appears Intact Hot/Cold: Appears Intact Proprioception: Impaired by gross assessment Stereognosis: Not tested Additional Comments: Pt reports decreased sensation in left hand only. Coordination Gross Motor Movements are Fluid and Coordinated: No Fine Motor Movements are Fluid and Coordinated: No Coordination and Movement Description: left hemi Finger Nose Finger Test: Slow and delayed movement Motor  Motor Motor: Hemiplegia;Motor apraxia  Mobility Bed Mobility Rolling Right: Supervision/verbal cueing Rolling Left: Supervision/Verbal cueing Supine to Sit: Supervision/Verbal cueing Sit to Supine: Supervision/Verbal cueing Transfers Sit to Stand: Contact Guard/Touching assist Stand to Sit: Contact Guard/Touching assist Stand Pivot Transfers: Contact Guard/Touching assist Stand Pivot Transfer Details: Verbal cues for gait pattern;Verbal cues for technique;Verbal cues for precautions/safety;Verbal cues for sequencing Transfer (Assistive device): Rolling walker Locomotion  Gait Ambulation: Yes Gait Assistance: Contact Guard/Touching assist Gait Distance (Feet): 200 Feet Assistive device: Rolling walker Gait Gait: Yes Gait Pattern: Impaired Gait Pattern: Step-through pattern;Decreased stance time - left;Decreased stride length;Decreased  hip/knee flexion - left;Decreased dorsiflexion - left;Decreased weight shift to left;Left flexed knee in stance Gait velocity: decreased Stairs / Additional Locomotion Stairs: Yes Stairs Assistance: Contact Guard/Touching assist Stair Management Technique: Two rails Number of Stairs: 8 Height of Stairs: 6 Ramp: Contact Guard/touching assist (with RW) Curb: Minimal Assistance - Patient >75% (with RW) Pick up small object from the floor assist level: Moderate Assistance - Patient 50 - 74% Wheelchair Mobility Wheelchair Mobility: Yes Wheelchair Assistance: Dependent - Patient 0% Wheelchair Parts Management: Needs assistance  Trunk/Postural Assessment  Cervical Assessment Cervical Assessment: Within Functional  Limits Thoracic Assessment Thoracic Assessment: Exceptions to [facility] (rounded shoulders) Lumbar Assessment Lumbar Assessment: Exceptions to [facility] (posterior pelvic tilt) Postural Control Postural Control: Deficits on evaluation  Balance Static Sitting Balance Static Sitting - Balance Support: No upper extremity supported;Feet supported Static Sitting - Level of Assistance: 5: Stand by assistance Dynamic Sitting Balance Dynamic Sitting - Balance Support: During functional activity Dynamic Sitting - Level of Assistance: 4: Min assist Extremity Assessment  RLE Assessment RLE Assessment: Within Functional Limits General Strength Comments: Grossly 5/5 throughout in sitting LLE Assessment LLE Assessment: Exceptions to [facility] LLE Strength LLE Overall Strength: Deficits Left Hip Flexion: 2/5 Left Hip ABduction: 3-/5 Left Hip ADduction: 2+/5 Left Knee Flexion: 3-/5 Left Knee Extension: 2+/5 Left Ankle Dorsiflexion: 4-/5 Left Ankle Plantar Flexion: 3+/5   Deadrick Stidd L Somaly Marteney PT, DPT, NCS, CBIS  08/01/2022, 12:59 PM

## 2022-08-01 NOTE — Progress Notes (Signed)
PROGRESS NOTE   Subjective/Complaints: No more vasovagal episodes as per wife Wife says she will be unable to pay for the Brillinta- discussed why Plavix is not preferred but she prefers this option due to cost  ROS: denies SOB, abd distress, +vasovagal episodes after having BM- none today Objective:   No results found. No results for input(s): "WBC", "HGB", "HCT", "PLT" in the last 72 hours.  Recent Labs    07/30/22 0511  NA 138  K 5.2*  CL 106  CO2 22  GLUCOSE 88  BUN 56*  CREATININE 1.86*  CALCIUM 9.2    Intake/Output Summary (Last 24 hours) at 08/01/2022 1032 Last data filed at 08/01/2022 0850 Gross per 24 hour  Intake 360 ml  Output --  Net 360 ml        Physical Exam: Vital Signs Blood pressure (!) 120/51, pulse (!) 59, temperature 98.4 F (36.9 C), temperature source Oral, resp. rate 20, height 5\' 9"  (1.753 m), weight 75.6 kg, SpO2 95 %.  Gen: no distress, normal appearing, BMI 24.61 HEENT: oral mucosa pink and moist, NCAT Cardio: Reg rate Chest: normal effort, normal rate of breathing Abd: soft, non-distended Ext: no edema Psych: pleasant, normal affect Skin: intact  Neuro:  Patient is alert.  Oriented to name , , not hospital, able to give  month, not year or date, Makes eye contact with examiner.  Speech is a dysarthric.  Follows simple commands. Able to name 2 items. Decreased shoulder shrug on Left,appears to have some L inattention,  EOMI, tongue midline,Left facial weakness, PERRL, visual field full Strength RUE and RLE 4+/5 LUE 3/5 shoulder abduction, 4-/5 elbow flexion, 3/5 elbow extension, finger flexion 3/5 LLE 1-2/5 hip flexion and knee extension, 3/5 ankle PF and DF Right FTN intact Sensation to LT altered in LUE and LLE Musculoskeletal: Left knee tenderness, no effusion,  no abnormal tone noted  Sit to stand with RW with S    Assessment/Plan: 1. Functional deficits  which require 3+ hours per day of interdisciplinary therapy in a comprehensive inpatient rehab setting. Physiatrist is providing close team supervision and 24 hour management of active medical problems listed below. Physiatrist and rehab team continue to assess barriers to discharge/monitor patient progress toward functional and medical goals  Care Tool:  Bathing    Body parts bathed by patient: Right arm, Left arm, Chest, Abdomen, Front perineal area, Buttocks, Right upper leg, Left upper leg, Right lower leg, Left lower leg, Face   Body parts bathed by helper: Right arm, Buttocks, Right upper leg, Left upper leg, Right lower leg, Left lower leg     Bathing assist Assist Level: Supervision/Verbal cueing     Upper Body Dressing/Undressing Upper body dressing   What is the patient wearing?: Pull over shirt    Upper body assist Assist Level: Supervision/Verbal cueing    Lower Body Dressing/Undressing Lower body dressing      What is the patient wearing?: Incontinence brief, Pants     Lower body assist Assist for lower body dressing: Moderate Assistance - Patient 50 - 74%     Toileting Toileting    Toileting assist Assist for toileting: Total Assistance - Patient <  25%     Transfers Chair/bed transfer  Transfers assist     Chair/bed transfer assist level: Moderate Assistance - Patient 50 - 74%     Locomotion Ambulation   Ambulation assist   Ambulation activity did not occur: Safety/medical concerns (vasovagal after toileting)  Assist level: 2 helpers Assistive device: Other (comment) (R hand rail) Max distance: 69ft   Walk 10 feet activity   Assist  Walk 10 feet activity did not occur: Safety/medical concerns  Assist level: 2 helpers     Walk 50 feet activity   Assist Walk 50 feet with 2 turns activity did not occur: Safety/medical concerns         Walk 150 feet activity   Assist Walk 150 feet activity did not occur: Safety/medical  concerns         Walk 10 feet on uneven surface  activity   Assist Walk 10 feet on uneven surfaces activity did not occur: Safety/medical concerns         Wheelchair     Assist Is the patient using a wheelchair?: Yes Type of Wheelchair: Manual    Wheelchair assist level: Dependent - Patient 0%      Wheelchair 50 feet with 2 turns activity    Assist        Assist Level: Dependent - Patient 0%   Wheelchair 150 feet activity     Assist      Assist Level: Dependent - Patient 0%   Blood pressure (!) 120/51, pulse (!) 59, temperature 98.4 F (36.9 C), temperature source Oral, resp. rate 20, height 5\' 9"  (1.753 m), weight 75.6 kg, SpO2 95 %.  Medical Problem List and Plan: 1. 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  Antithrombotics: -DVT/anticoagulation:  Pharmaceutical: Heparin             -antiplatelet therapy: Aspirin 81 mg daily and Brilinta 90 mg twice daily. 3. Pain Management: Tylenol as needed 4. Dementia: continue Aricept 10 mg nightly, Namenda 10 mg twice daily.             -antipsychotic agents: N/A 5. Neuropsych/cognition: This patient is not capable of making decisions on his own behalf. 6. Skin/Wound Care: Routine skin checks 7. Fluids/Electrolytes/Nutrition: Routine in and outs with follow-up chemistries 8.  History of Hypertension with current hypotensive and hypertensive readings: decrease norvasc to 2.5mg , d/c Coreg, continue lisinopril 40 mg daily.  Monitor with increased mobility. BP goal SBP 130-150. Provided list of foods to help with blood pressure. Discontinue amlodipine since BP  is below goal and given vasovagal symptoms Vitals:   08/01/22 0410 08/01/22 0745  BP: (!) 129/53 (!) 120/51  Pulse: (!) 57 (!) 59  Resp: 18 20  Temp: 98.1 F (36.7 C) 98.4 F (36.9 C)  SpO2: 96% 95%    9.  Diabetes mellitus with peripheral neuropathy.  Hemoglobin A1c 6.5.  Glucophage 500 mg twice daily, Glucotrol 5 mg daily. Monitor closely as he is on prednisone.Change glipizide to with supper.  CBG (last  3)  Recent Labs    07/31/22 1138 07/31/22 1609 08/01/22 0514  GLUCAP 135* 147* 125*   Hold metformin given worsening creatinine. Discussed with wife. Discussed goal CBGs are 70-90 but patient's CBGs are better than they have been 10. Hyperlipidemia.  Continue Lipitor 80mg  daily 11. CKD2. Cr uptrending- will place nursing order to encourage 6-8 glasses of water per day. Hold metformin since Creatinine is worsening. Continue to hold, discuss that CBGs are stable.  12. Dementia. Continue Aricept and Namenda  13. Gout L knee. Prednisone 20mg  for 5 days.(Completed) add volatren gel 14. Vasovagal episode: d/c Coreg. Discontinue amlodipine. Discussed with wife.  15. Bradycardia: d/c Coreg. Improved but still present. Discontinue amlodipine   LOS: 8 days A FACE TO FACE EVALUATION WAS PERFORMED  Eastin Swing 08/01/2022, 10:32 AM

## 2022-08-01 NOTE — Plan of Care (Signed)
  Problem: RH Cognition - SLP Goal: RH LTG Patient will demonstrate orientation with cues Description:  LTG:  Patient will demonstrate orientation to person/place/time/situation with cues (SLP)   Outcome: Completed/Met   Problem: RH Expression Communication Goal: LTG Patient will increase speech intelligibility (SLP) Description: LTG: Patient will increase speech intelligibility at word/phrase/conversation level with cues, % of the time (SLP) Outcome: Completed/Met   Problem: RH Awareness Goal: LTG: Patient will demonstrate awareness during functional activites type of (SLP) Description: LTG: Patient will demonstrate awareness during functional activites type of (SLP) Outcome: Completed/Met   Problem: RH Expression Communication Goal: LTG Patient will express needs/wants via multi-modal(SLP) Description: LTG:  Patient will express needs/wants via multi-modal communication (gestures/written, etc) with cues (SLP) Outcome: Completed/Met

## 2022-08-01 NOTE — Progress Notes (Signed)
Occupational Therapy Session Note  Patient Details  Name: Mitchell Washington MRN: [id number] Date of Birth: Jul 27, 1951  Today's Date: 08/01/2022 OT Individual Time: 1045-1130 OT Individual Time Calculation (min): 45 min    Short Term Goals: Week 1:  OT Short Term Goal 1 (Week 1): Pt will progress bed mobility to Min A in order to transition to sitting on EOB to progress OOB activity for morning ADL. OT Short Term Goal 2 (Week 1): Pt will increase LB bathing to Mod A while utilizing AE as needed. OT Short Term Goal 3 (Week 1): Pt will increase LB dressing to Mod A while utilizing hemi dressing techniques. OT Short Term Goal 4 (Week 1): Pt will complete toilet transfer with Mod A.  Skilled Therapeutic Interventions/Progress Updates:    Pt resting in bed upon arrival with wife present. Min encouragement for pt to sit EOB and participate in therapy. Supine>sit EOB with min A. Sit<>stand from EOB with supervision and max verbal cues for sequencing/safety. Amb with RW to w/c with CGA. Transitioned to gym. Pt required max verbal cues to assemble "goal post" pvc pipe structure. Pt unable to don gloves when requested. Pt c/o significant Lt knee pain (see below) and unable to continue with therapy. Returned to room and transferred to EOB with CGA. Sit>supine with min A. Pt remained in bed with RN attending. Wife present.   Therapy Documentation Precautions:  Precautions Precautions: Fall Precaution Comments: L hemi, dementia Restrictions Weight Bearing Restrictions: No General: General OT Amount of Missed Time: 30 Minutes Pain: Pt reports "really bad" Lt knee pain; RN aware and attending to pt   Therapy/Group: Individual Therapy  Rich Brave 08/01/2022, 11:39 AM

## 2022-08-01 NOTE — Progress Notes (Addendum)
Inpatient Rehabilitation Discharge Medication Review by a Pharmacist  A complete drug regimen review was completed for this patient to identify any potential clinically significant medication issues.  High Risk Drug Classes Is patient taking? Indication by Medication  Antipsychotic No   Anticoagulant No   Antibiotic No   Opioid No   Antiplatelet Yes Ticagrelor, ASA- CVA  Hypoglycemics/insulin Yes Glipizide- T2DM  Vasoactive Medication Yes Lisinopril, amlodipine- HTN  Chemotherapy No   Other Yes Atorvastatin- HLD Donepezil, memantine- dementia Voltaren- gout     Type of Medication Issue Identified Description of Issue Recommendation(s)  Drug Interaction(s) (clinically significant)     Duplicate Therapy     Allergy     No Medication Administration End Date     Incorrect Dose     Additional Drug Therapy Needed     Significant med changes from prior encounter (inform family/care partners about these prior to discharge).    Other       Clinically significant medication issues were identified that warrant physician communication and completion of prescribed/recommended actions by midnight of the next day:  No  Time spent performing this drug regimen review (minutes):  20   Billy Coast 08/01/2022 12:20 PM

## 2022-08-01 NOTE — Progress Notes (Addendum)
Speech Language Pathology Discharge Summary  Patient Details  Name: Mitchell Washington MRN: [id number] Date of Birth: 05/24/1951  Date of Discharge from SLP service:August 01, 2022  Today's Date: 08/01/2022 SLP Individual Time: 0930-1005 SLP Individual Time Calculation (Mitchell): 35 Mitchell  Skilled Therapeutic Interventions:   Pt seen this date for skilled ST intervention targeting speech intelligibility and cognitive goals outlined in care plan. Pt received semi-reclined in bed and sleeping; aroused slowly to name. Wife present and states he walked all the way to the gym with PT this AM. Pt with minimal participation despite much encouragement and re-education re: ST POC. Upon inquiry, pt states he feels his speech has improved slightly since CIR admission.  Today's session with emphasis on pt/family education and use of compensatory speech intelligibility strategies within context of making phone calls given pt reports he enjoys talking on the phone with his family. SLP provided ongoing education to pt's wife re: pt's current dysarthria, improving comprehensibility by reducing and/or elimination extraneous environmental noise + reducing distractions, re-stating what was heard, and requesting clarification when unsure of intended message. Given pt's baseline dx of dementia, pt's wife has been present and active in most therapy sessions, and has demonstrated the ability to cue and question pt appropriately. Provided recommendation to f/u with ST intervention post-discharge. Pt with minimal interaction and appeared apathetic despite SLP attempts to ask personal questions re: upcoming discharge. With much encouragement, pt placed phone call to order his lunch with Mitchell Washington for dialing number, and Mitchell Washington verbal cues to increase his vocal intensity, which he did not consistently implement even with cueing. Subjectively, pt was judged to be ~80% intelligible at the phrase level given Mitchell-Mod to Mod Washington verbal cues;  70-75% without cues. Due to fatigue and decreased willingness to participate, pt missed 10 minutes of scheduled ST session.  Pt left in bed with all safety measures activated and call bell within reach. Wife remained at bedside. Please see below for discharge summary.   Patient has met 4 of 4 long term goals.  Patient to discharge at overall Mod level.  Reasons goals not met:     Clinical Impression/Discharge Summary:  Pt has demonstrated subtle progress during CIR admission re: speech intelligibility, as evident by meeting Mitchell Washington orientation and communication goals, and Mod-Max Washington goals for improving speech intelligibility and awareness (intellectual and emergent). Education has been completed with pt and pt's wife who verbalized understanding of skilled interventions, and how they can be implemented within functional tasks at home. Pt's wife has been present and active in therapy session, and has demonstrated understanding of appropriate cueing for pt to utilize compensatory speech intelligibility strategies more independently vs talking for him. Continues to benefit from verbal cues and external aids to demonstrate orientation and awareness of current deficits and situation likely due to dementia. Tolerating regular diet consistencies and thin liquids with only set-up assistance. Recommend 24/7 supervision and assistance at time of d/c, as well as short-course of HHST to reinforce use of compensatory speech intelligibility strategies in home environment.    Care Partner:  Caregiver Able to Provide Assistance: Yes  Type of Caregiver Assistance: Physical;Cognitive  Recommendation:  Home Health SLP;24 hour supervision/assistance  Rationale for SLP Follow Up: Maximize functional communication;Reduce caregiver burden   Equipment: N/Washington   Reasons for discharge: Discharged from hospital   Patient/Family Agrees with Progress Made and Goals Achieved: Yes    Mitchell Mitchell Washington 08/01/2022, 10:14 AM

## 2022-08-02 LAB — GLUCOSE, CAPILLARY: Glucose-Capillary: 133 mg/dL — ABNORMAL HIGH (ref 70–99)

## 2022-08-02 NOTE — Progress Notes (Signed)
Family at bedside. Patient In no distress, ready to discharge. PA Jesusita Oka has spoken with family and went over d/c instructions. Medications from transitional pharmacy have been delivered to the room. Patient ready to leave unit.

## 2022-08-13 ENCOUNTER — Encounter: Payer: Medicare HMO | Admitting: Physical Medicine and Rehabilitation

## 2022-08-14 ENCOUNTER — Emergency Department: Payer: Medicare HMO

## 2022-08-14 ENCOUNTER — Inpatient Hospital Stay
Admission: EM | Admit: 2022-08-14 | Discharge: 2022-08-22 | DRG: 057 | Disposition: A | Discharge status: Rehabilitation Facility | Payer: Medicare HMO | Attending: Internal Medicine | Admitting: Internal Medicine

## 2022-08-14 ENCOUNTER — Other Ambulatory Visit: Payer: Self-pay

## 2022-08-14 ENCOUNTER — Inpatient Hospital Stay: Payer: Medicare HMO

## 2022-08-14 DIAGNOSIS — R471 Dysarthria and anarthria: Secondary | ICD-10-CM | POA: Diagnosis present

## 2022-08-14 DIAGNOSIS — R29709 NIHSS score 9: Secondary | ICD-10-CM | POA: Diagnosis present

## 2022-08-14 DIAGNOSIS — F0390 Unspecified dementia without behavioral disturbance: Secondary | ICD-10-CM | POA: Diagnosis present

## 2022-08-14 DIAGNOSIS — I69398 Other sequelae of cerebral infarction: Secondary | ICD-10-CM | POA: Diagnosis not present

## 2022-08-14 DIAGNOSIS — I129 Hypertensive chronic kidney disease with stage 1 through stage 4 chronic kidney disease, or unspecified chronic kidney disease: Secondary | ICD-10-CM | POA: Diagnosis present

## 2022-08-14 DIAGNOSIS — I10 Essential (primary) hypertension: Secondary | ICD-10-CM | POA: Diagnosis present

## 2022-08-14 DIAGNOSIS — E1122 Type 2 diabetes mellitus with diabetic chronic kidney disease: Secondary | ICD-10-CM | POA: Diagnosis present

## 2022-08-14 DIAGNOSIS — E7800 Pure hypercholesterolemia, unspecified: Secondary | ICD-10-CM | POA: Diagnosis not present

## 2022-08-14 DIAGNOSIS — N1832 Chronic kidney disease, stage 3b: Secondary | ICD-10-CM | POA: Diagnosis present

## 2022-08-14 DIAGNOSIS — I959 Hypotension, unspecified: Secondary | ICD-10-CM | POA: Diagnosis present

## 2022-08-14 DIAGNOSIS — M1712 Unilateral primary osteoarthritis, left knee: Secondary | ICD-10-CM | POA: Diagnosis present

## 2022-08-14 DIAGNOSIS — Z833 Family history of diabetes mellitus: Secondary | ICD-10-CM

## 2022-08-14 DIAGNOSIS — M109 Gout, unspecified: Secondary | ICD-10-CM | POA: Diagnosis present

## 2022-08-14 DIAGNOSIS — I6629 Occlusion and stenosis of unspecified posterior cerebral artery: Secondary | ICD-10-CM | POA: Diagnosis present

## 2022-08-14 DIAGNOSIS — N1831 Chronic kidney disease, stage 3a: Secondary | ICD-10-CM | POA: Diagnosis not present

## 2022-08-14 DIAGNOSIS — E1142 Type 2 diabetes mellitus with diabetic polyneuropathy: Secondary | ICD-10-CM | POA: Diagnosis present

## 2022-08-14 DIAGNOSIS — Z515 Encounter for palliative care: Secondary | ICD-10-CM | POA: Diagnosis not present

## 2022-08-14 DIAGNOSIS — R260 Ataxic gait: Secondary | ICD-10-CM | POA: Diagnosis present

## 2022-08-14 DIAGNOSIS — R531 Weakness: Principal | ICD-10-CM

## 2022-08-14 DIAGNOSIS — Z87891 Personal history of nicotine dependence: Secondary | ICD-10-CM | POA: Diagnosis not present

## 2022-08-14 DIAGNOSIS — R29810 Facial weakness: Secondary | ICD-10-CM | POA: Diagnosis present

## 2022-08-14 DIAGNOSIS — E860 Dehydration: Secondary | ICD-10-CM | POA: Diagnosis present

## 2022-08-14 DIAGNOSIS — Z1152 Encounter for screening for COVID-19: Secondary | ICD-10-CM

## 2022-08-14 DIAGNOSIS — Z66 Do not resuscitate: Secondary | ICD-10-CM | POA: Diagnosis not present

## 2022-08-14 DIAGNOSIS — Z7982 Long term (current) use of aspirin: Secondary | ICD-10-CM

## 2022-08-14 DIAGNOSIS — I69354 Hemiplegia and hemiparesis following cerebral infarction affecting left non-dominant side: Secondary | ICD-10-CM | POA: Diagnosis not present

## 2022-08-14 DIAGNOSIS — I651 Occlusion and stenosis of basilar artery: Secondary | ICD-10-CM | POA: Diagnosis present

## 2022-08-14 DIAGNOSIS — N179 Acute kidney failure, unspecified: Secondary | ICD-10-CM | POA: Diagnosis present

## 2022-08-14 DIAGNOSIS — Z8673 Personal history of transient ischemic attack (TIA), and cerebral infarction without residual deficits: Secondary | ICD-10-CM | POA: Diagnosis not present

## 2022-08-14 DIAGNOSIS — I639 Cerebral infarction, unspecified: Secondary | ICD-10-CM | POA: Diagnosis not present

## 2022-08-14 DIAGNOSIS — R413 Other amnesia: Secondary | ICD-10-CM | POA: Diagnosis present

## 2022-08-14 DIAGNOSIS — I6350 Cerebral infarction due to unspecified occlusion or stenosis of unspecified cerebral artery: Secondary | ICD-10-CM | POA: Diagnosis not present

## 2022-08-14 DIAGNOSIS — R42 Dizziness and giddiness: Secondary | ICD-10-CM | POA: Diagnosis present

## 2022-08-14 DIAGNOSIS — Z7984 Long term (current) use of oral hypoglycemic drugs: Secondary | ICD-10-CM

## 2022-08-14 DIAGNOSIS — E1169 Type 2 diabetes mellitus with other specified complication: Secondary | ICD-10-CM | POA: Diagnosis not present

## 2022-08-14 DIAGNOSIS — Z79899 Other long term (current) drug therapy: Secondary | ICD-10-CM

## 2022-08-14 LAB — COMPREHENSIVE METABOLIC PANEL
ALT: 189 U/L — ABNORMAL HIGH (ref 0–44)
AST: 83 U/L — ABNORMAL HIGH (ref 15–41)
Albumin: 3.8 g/dL (ref 3.5–5.0)
Alkaline Phosphatase: 77 U/L (ref 38–126)
Anion gap: 9 (ref 5–15)
BUN: 57 mg/dL — ABNORMAL HIGH (ref 8–23)
CO2: 20 mmol/L — ABNORMAL LOW (ref 22–32)
Calcium: 8.9 mg/dL (ref 8.9–10.3)
Chloride: 110 mmol/L (ref 98–111)
Creatinine, Ser: 2.75 mg/dL — ABNORMAL HIGH (ref 0.61–1.24)
GFR, Estimated: 24 mL/min — ABNORMAL LOW (ref >60)
Glucose, Bld: 131 mg/dL — ABNORMAL HIGH (ref 70–99)
Potassium: 5.4 mmol/L — ABNORMAL HIGH (ref 3.5–5.1)
Sodium: 139 mmol/L (ref 135–145)
Total Bilirubin: 0.5 mg/dL (ref 0.3–1.2)
Total Protein: 7.8 g/dL (ref 6.5–8.1)

## 2022-08-14 LAB — CBC
HCT: 38.8 % — ABNORMAL LOW (ref 39.0–52.0)
Hemoglobin: 12.3 g/dL — ABNORMAL LOW (ref 13.0–17.0)
MCH: 26.9 pg (ref 26.0–34.0)
MCHC: 31.7 g/dL (ref 30.0–36.0)
MCV: 84.7 fL (ref 80.0–100.0)
Platelets: 297 10*3/uL (ref 150–400)
RBC: 4.58 MIL/uL (ref 4.22–5.81)
RDW: 14.2 % (ref 11.5–15.5)
WBC: 6.2 10*3/uL (ref 4.0–10.5)
nRBC: 0 % (ref 0.0–0.2)

## 2022-08-14 LAB — URINALYSIS, ROUTINE W REFLEX MICROSCOPIC
Bilirubin Urine: NEGATIVE
Glucose, UA: NEGATIVE mg/dL
Hgb urine dipstick: NEGATIVE
Ketones, ur: NEGATIVE mg/dL
Nitrite: NEGATIVE
Protein, ur: NEGATIVE mg/dL
Specific Gravity, Urine: 1.021 (ref 1.005–1.030)
pH: 5 (ref 5.0–8.0)

## 2022-08-14 LAB — RESP PANEL BY RT-PCR (RSV, FLU A&B, COVID)  RVPGX2
Influenza A by PCR: NEGATIVE
Influenza B by PCR: NEGATIVE
Resp Syncytial Virus by PCR: NEGATIVE
SARS Coronavirus 2 by RT PCR: NEGATIVE

## 2022-08-14 LAB — CBG MONITORING, ED: Glucose-Capillary: 143 mg/dL — ABNORMAL HIGH (ref 70–99)

## 2022-08-14 IMAGING — CR DG CHEST 2V
1 series · 2 of 2 positions shown · non-contrast
Comparison: 10/11/2018

CLINICAL DATA: Cough, seizure x 2 today, fever since [REDACTED],
confusion, baseline dementia

EXAM:
CHEST - 2 VIEW

[Series 1: dg chest 2 view · 0.14mm/px · 2 of 2 slices shown]
[im 1/2]
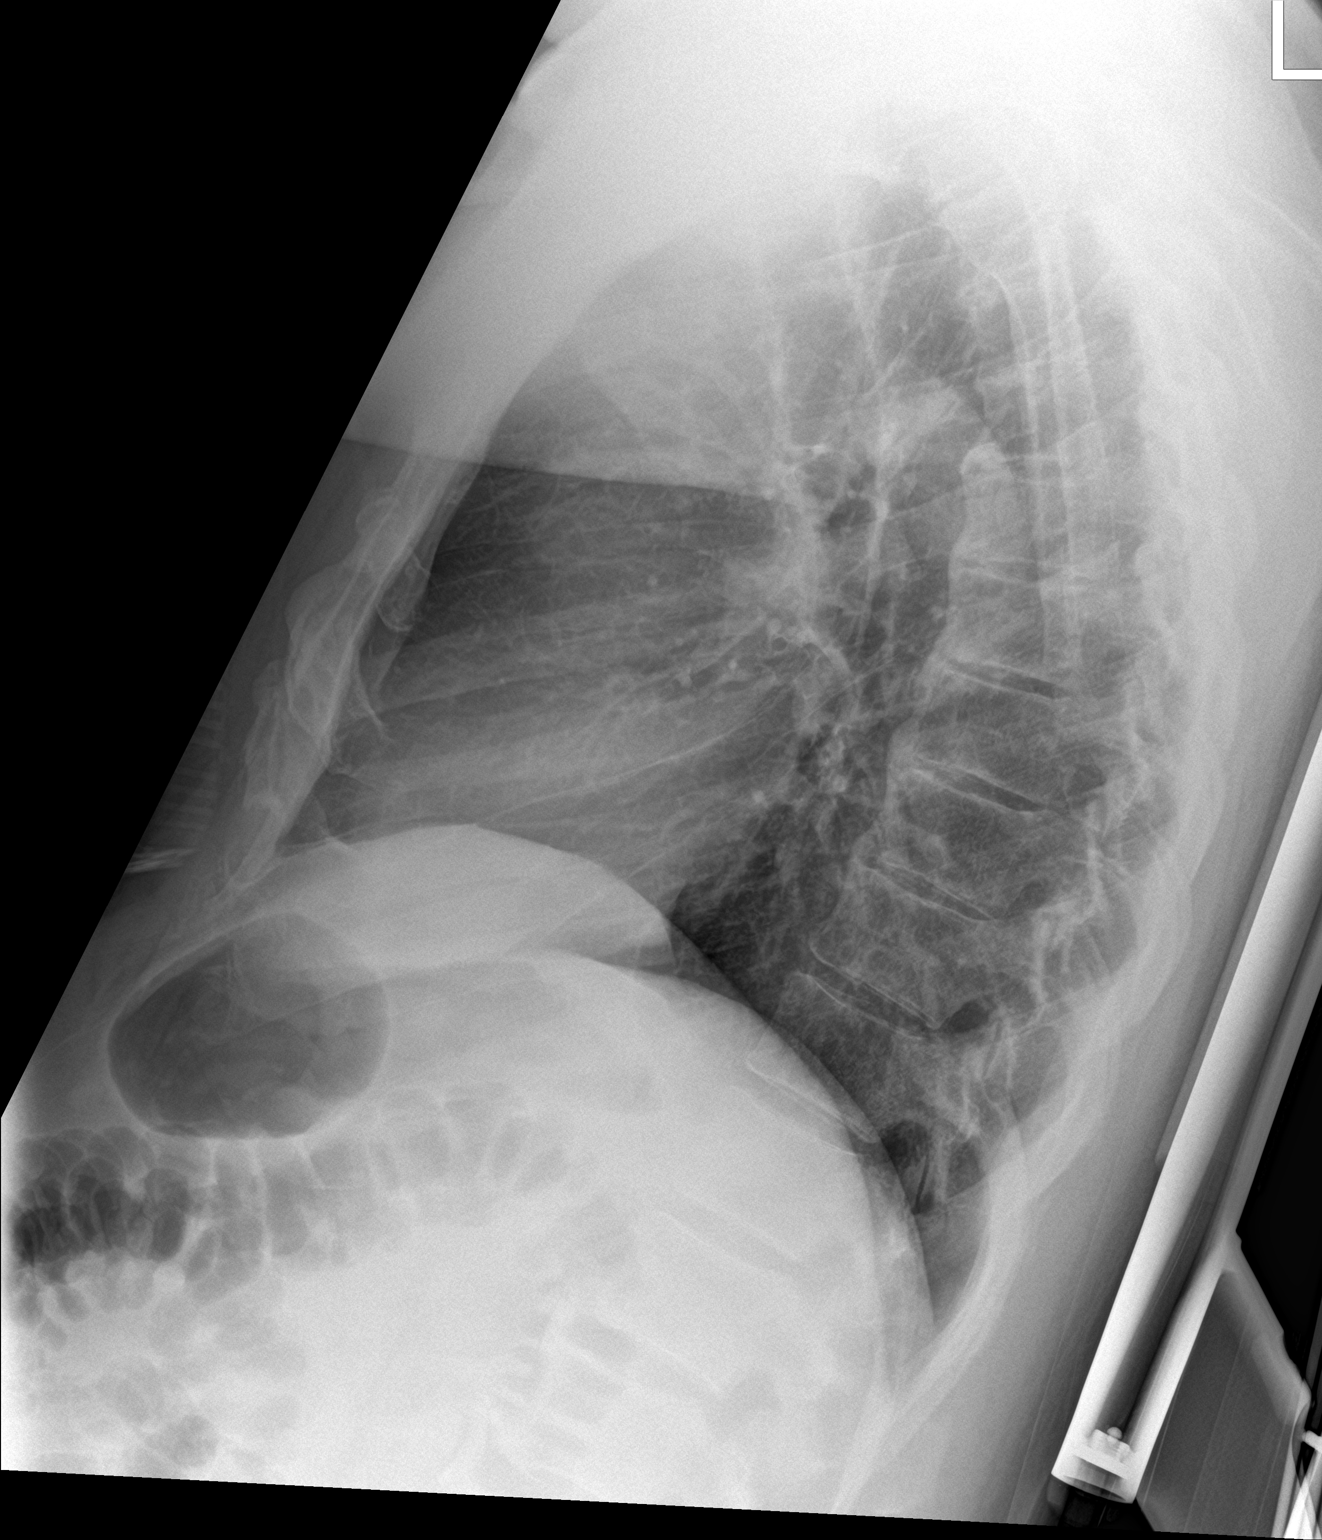
[im 2/2]
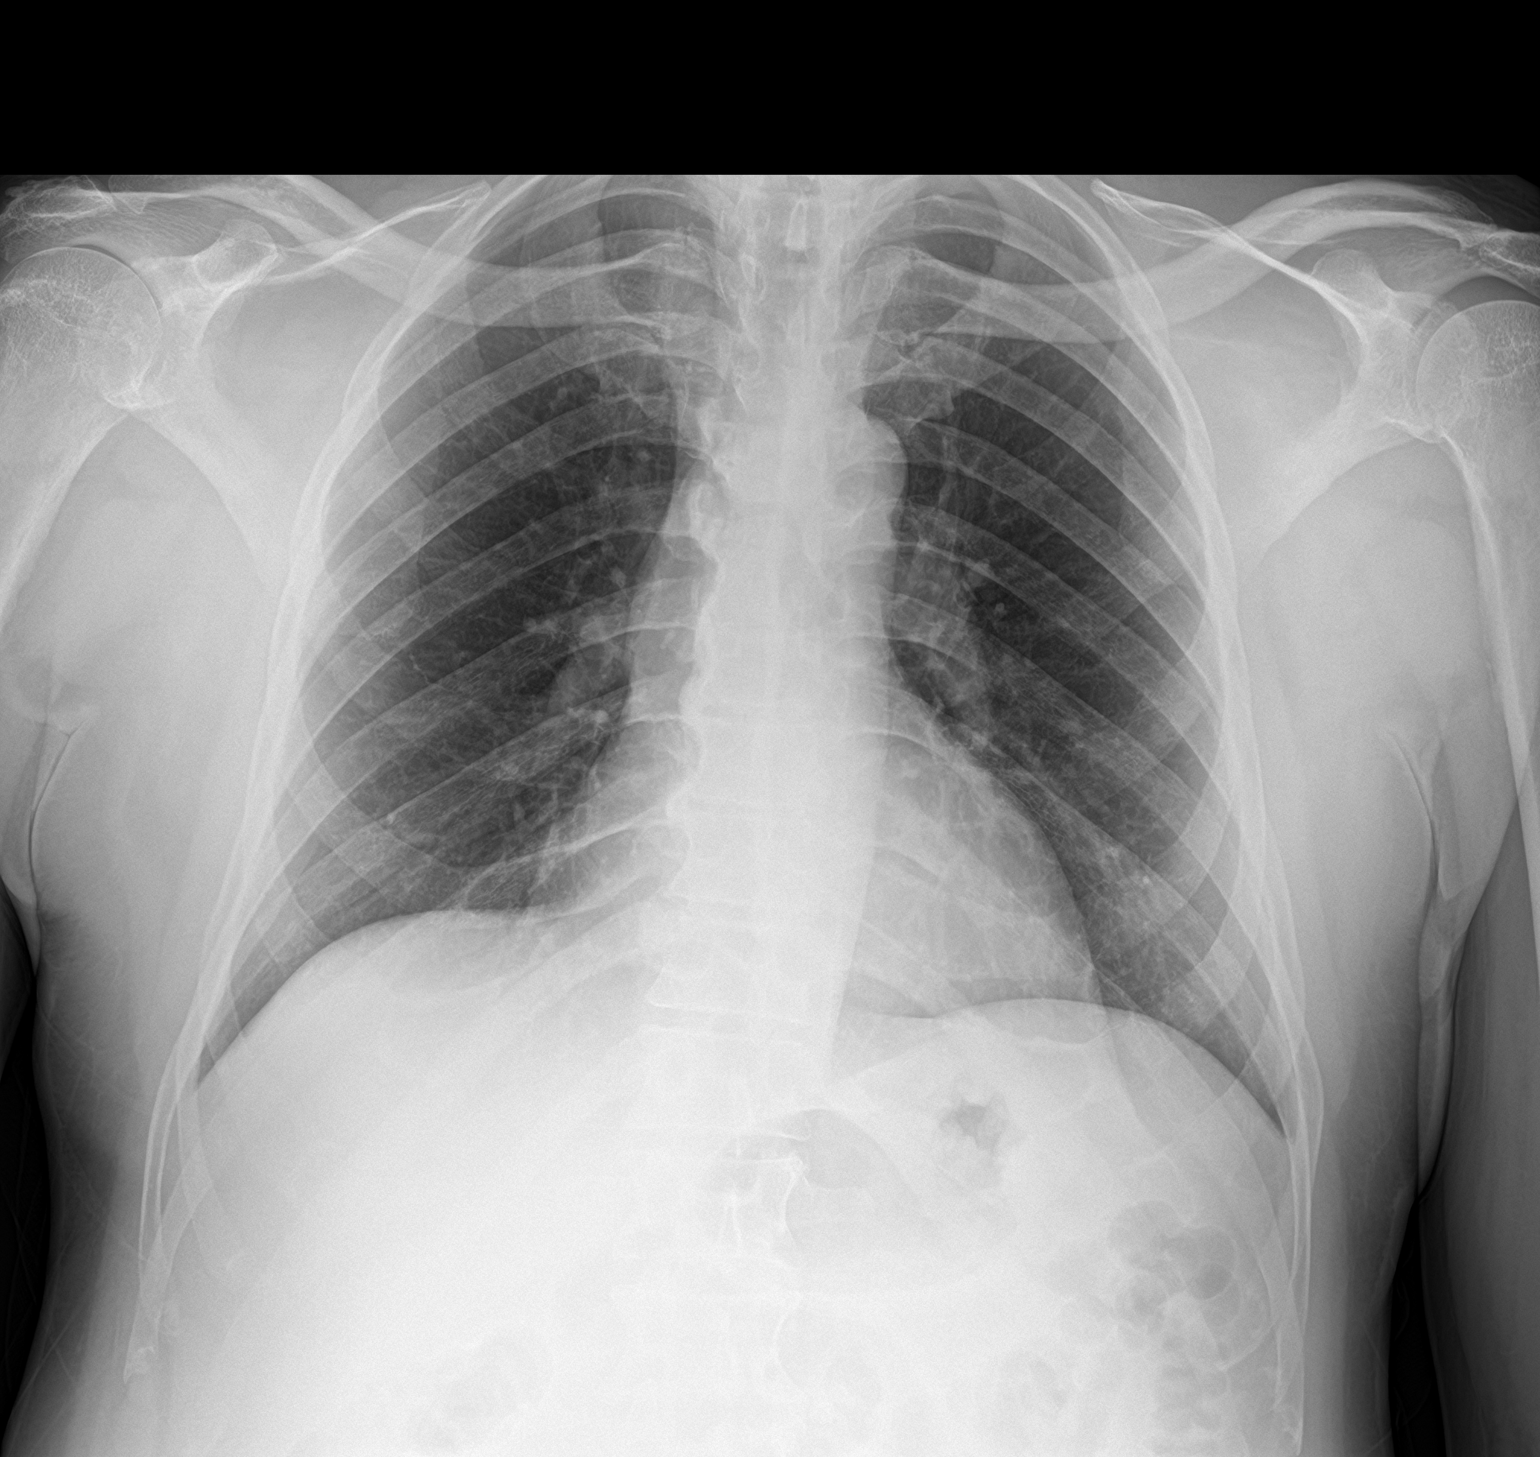

[2 of 2 positions shown; findings below may reference images not displayed]

FINDINGS: Normal heart size, mediastinal contours, and pulmonary vascularity.

Atherosclerotic calcification aorta.

Lungs clear.

No pulmonary infiltrate, pleural effusion, or pneumothorax.

No acute osseous findings.
IMPRESSION: No acute abnormalities.

Aortic Atherosclerosis ([id number]).

## 2022-08-14 MED ORDER — ASPIRIN 81 MG PO TBEC
81.0000 mg | DELAYED_RELEASE_TABLET | Freq: Every day | ORAL | Status: DC
  Administered 2022-08-15 – 2022-08-22 (×8): 81 mg via ORAL
  Filled 2022-08-14 (×9): qty 1

## 2022-08-14 MED ORDER — ACETAMINOPHEN 650 MG RE SUPP: 650.0000 mg | RECTAL | Status: DC | PRN

## 2022-08-14 MED ORDER — STROKE: EARLY STAGES OF RECOVERY BOOK: Freq: Once | Status: AC

## 2022-08-14 MED ORDER — INSULIN ASPART 100 UNIT/ML IJ SOLN: 0.0000 [IU] | Freq: Every day | INTRAMUSCULAR | Status: DC

## 2022-08-14 MED ORDER — ENOXAPARIN SODIUM 30 MG/0.3ML IJ SOSY
30.0000 mg | PREFILLED_SYRINGE | INTRAMUSCULAR | Status: DC
  Administered 2022-08-15 – 2022-08-16 (×2): 30 mg via SUBCUTANEOUS
  Filled 2022-08-14 (×2): qty 0.3

## 2022-08-14 MED ORDER — SODIUM CHLORIDE 0.9 % IV BOLUS
500.0000 mL | Freq: Once | INTRAVENOUS | Status: AC
  Administered 2022-08-14: 500 mL via INTRAVENOUS

## 2022-08-14 MED ORDER — ACETAMINOPHEN 160 MG/5ML PO SOLN: 650.0000 mg | ORAL | Status: DC | PRN

## 2022-08-14 MED ORDER — DONEPEZIL HCL 5 MG PO TABS
10.0000 mg | ORAL_TABLET | Freq: Every day | ORAL | Status: DC
  Administered 2022-08-14 – 2022-08-21 (×8): 10 mg via ORAL
  Filled 2022-08-14 (×8): qty 2

## 2022-08-14 MED ORDER — INSULIN ASPART 100 UNIT/ML IJ SOLN
0.0000 [IU] | Freq: Three times a day (TID) | INTRAMUSCULAR | Status: DC
  Administered 2022-08-15: 3 [IU] via SUBCUTANEOUS
  Administered 2022-08-15: 2 [IU] via SUBCUTANEOUS
  Administered 2022-08-16 – 2022-08-18 (×2): 3 [IU] via SUBCUTANEOUS
  Administered 2022-08-19 (×2): 2 [IU] via SUBCUTANEOUS
  Administered 2022-08-19: 3 [IU] via SUBCUTANEOUS
  Administered 2022-08-20: 2 [IU] via SUBCUTANEOUS
  Administered 2022-08-20: 5 [IU] via SUBCUTANEOUS
  Administered 2022-08-20: 3 [IU] via SUBCUTANEOUS
  Administered 2022-08-21: 8 [IU] via SUBCUTANEOUS
  Administered 2022-08-21 – 2022-08-22 (×2): 5 [IU] via SUBCUTANEOUS
  Filled 2022-08-14 (×14): qty 1

## 2022-08-14 MED ORDER — MEMANTINE HCL 5 MG PO TABS
10.0000 mg | ORAL_TABLET | Freq: Two times a day (BID) | ORAL | Status: DC
  Administered 2022-08-14 – 2022-08-22 (×16): 10 mg via ORAL
  Filled 2022-08-14 (×17): qty 2

## 2022-08-14 MED ORDER — ACETAMINOPHEN 325 MG PO TABS
650.0000 mg | ORAL_TABLET | ORAL | Status: DC | PRN
  Administered 2022-08-20 – 2022-08-21 (×2): 650 mg via ORAL
  Filled 2022-08-14 (×2): qty 2

## 2022-08-14 MED ORDER — ATORVASTATIN CALCIUM 20 MG PO TABS
80.0000 mg | ORAL_TABLET | Freq: Every day | ORAL | Status: DC
  Administered 2022-08-15 – 2022-08-22 (×8): 80 mg via ORAL
  Filled 2022-08-14 (×9): qty 4

## 2022-08-14 MED ORDER — TICAGRELOR 90 MG PO TABS
90.0000 mg | ORAL_TABLET | Freq: Two times a day (BID) | ORAL | Status: DC
  Administered 2022-08-14 – 2022-08-22 (×15): 90 mg via ORAL
  Filled 2022-08-14 (×18): qty 1

## 2022-08-14 NOTE — ED Notes (Signed)
Paged out Teleneuro for stat consult per Dr. Roxan Hockey

## 2022-08-14 NOTE — Assessment & Plan Note (Signed)
Continue donepezil and memantine Delirium precautions

## 2022-08-14 NOTE — ED Triage Notes (Signed)
Pt here with dehydration. Wife states pt was fine until Christmas day when he could not walk. Pt's left side is dangling per wife.LKW was Sunday 08/11/22.

## 2022-08-14 NOTE — ED Notes (Signed)
Fsbs 143

## 2022-08-14 NOTE — H&P (Signed)
History and Physical    Patient: Mitchell Washington ZOX:[id number] DOB: 08/29/1950 DOA: 08/14/2022 DOS: the patient was seen and examined on 08/14/2022 PCP: Kandyce RudBabaoff, Marcus, MD  Patient coming from: Home  Chief Complaint:  Chief Complaint  Patient presents with   Dehydration    HPI: Mitchell Washington is a 71 y.o. male with medical history significant for Type 2 diabetes, HTN, dementia, HLD and gout prior CVAs, most recently 07/16/2022  s/p balloon angioplasty for posterior circulation occlusion, (MRI confirmed multiple small bilateral cerebellar infarcts)discharged on aspirin and Brilinta who presents to the ED with left-sided weakness that started on 12/25.  Wife at the bedside gives most of the history and stated that he was walking and suddenly appeared to stumble and limp and had difficulty supporting himself on his left leg.  She thought he was tired because he had been walking a lot that day but then noticed that the following day he had persistent difficulty and also has poor oral intake.  He has had no cough or shortness of breath, fever or chills, vomiting or diarrhea or abdominal pain. ED course and data review: On arrival hypotensive at 97/46 with pulse 59 and otherwise normal vitals.  Blood work significant for creatinine of 2.75 up from a recent past of 1.38 about 3 weeks prior.  Potassium 5.4.  WBC normal with hemoglobin at baseline at 12.3.  Respiratory panel for COVID flu and RSV negative.  Urinalysis showed moderate leukocyte esterase.  EKG, personally viewed and interpreted showing sinus at 67 with nonspecific ST-T wave changes.  CT head with old left ganglial lacunar infarct but otherwise nonacute.  MRI pending Patient was evaluated by teleneurology who opined that patient has likely suffered a recurrent stroke and is currently outside the time window for IV thrombolytic therapy as well as endovascular therapy the recommended repeating the CTA of head and neck to assess patency in the  regions previously performed. Patient was hydrated with NS 500 mL and hospitalist consulted for admission.     Past Medical History:  Diagnosis Date   CVA (cerebral infarction)    Diabetes mellitus without complication (HCC)    Gout    Hyperlipidemia    Hypertension    Memory loss    Stroke [facility](HCC)    Wears dentures    partial upper   Past Surgical History:  Procedure Laterality Date   APPENDECTOMY     CATARACT EXTRACTION W/PHACO Left 10/25/2019   Procedure: CATARACT EXTRACTION PHACO AND INTRAOCULAR LENS PLACEMENT (IOC) LEFT DIABETIC INTRAVEITREAL KENALOG INJECTION 1.93  00:22.6;  Surgeon: [place] CraneKing, Bradley Mark, MD;  [facility];  Service: Ophthalmology;  Laterality: Left;  Diabetic - oral meds   CATARACT EXTRACTION W/PHACO Right 11/15/2019   Procedure: CATARACT EXTRACTION PHACO AND INTRAOCULAR LENS PLACEMENT (IOC) RIGHT DIABETIC;  Surgeon: [place] CraneKing, Bradley Mark, MD;  [facility];  Service: Ophthalmology;  Laterality: Right;  0.92 0 ;19.7   IR ANGIO INTRA EXTRACRAN SEL COM CAROTID INNOMINATE BILAT MOD SED  07/16/2022   IR ANGIO VERTEBRAL SEL SUBCLAVIAN INNOMINATE UNI R MOD SED  07/16/2022   IR ANGIO VERTEBRAL SEL VERTEBRAL UNI L MOD SED  07/16/2022   IR ANGIO VERTEBRAL SEL VERTEBRAL UNI L MOD SED  07/22/2022   IR CT HEAD LTD  07/18/2022   IR PTA INTRACRANIAL  07/18/2022   RADIOLOGY WITH ANESTHESIA N/A 07/18/2022   Procedure: Cerebral angioplasty with possible stenting;  Surgeon: Julieanne Cottoneveshwar, Sanjeev, MD;  [facility];  Service: Radiology;  Laterality: N/A;   Social History:  reports that he has quit smoking. He has never used smokeless tobacco. He reports that he does not currently use alcohol. He reports that he does not use drugs.  No Known Allergies  Family History  Problem Relation Age of Onset   Diabetes Mother     Prior to Admission medications   Medication Sig Start Date End Date Taking? Authorizing Provider  acetaminophen (TYLENOL) 325 MG  tablet Take 2 tablets (650 mg total) by mouth every 4 (four) hours as needed for mild pain (or temp > 37.5 C (99.5 F)). 08/01/22   Angiulli, Mcarthur Rossetti, PA-C  amLODipine (NORVASC) 2.5 MG tablet Take 1 tablet (2.5 mg total) by mouth daily. 08/01/22   Angiulli, Mcarthur Rossetti, PA-C  aspirin EC 81 MG tablet Take 81 mg by mouth daily.    [provider]  atorvastatin (LIPITOR) 80 MG tablet Take 1 tablet (80 mg total) by mouth daily. 08/02/22   Angiulli, Mcarthur Rossetti, PA-C  cyanocobalamin (VITAMIN B12) 1000 MCG tablet Take 1 tablet (1,000 mcg total) by mouth daily. 08/01/22   Angiulli, Mcarthur Rossetti, PA-C  diclofenac Sodium (VOLTAREN) 1 % GEL Apply 2 g topically 4 (four) times daily. 08/01/22   Angiulli, Mcarthur Rossetti, PA-C  donepezil (ARICEPT) 10 MG tablet Take 10 mg by mouth at bedtime. 08/27/18   [provider]  glipiZIDE (GLUCOTROL XL) 2.5 MG 24 hr tablet Take 1 tablet (2.5 mg total) by mouth daily before breakfast. 08/01/22   Angiulli, Mcarthur Rossetti, PA-C  lisinopril (ZESTRIL) 40 MG tablet Take 1 tablet (40 mg total) by mouth daily. 08/01/22   Angiulli, Mcarthur Rossetti, PA-C  memantine (NAMENDA) 10 MG tablet Take 10 mg by mouth 2 (two) times daily. 12/20/19   [provider]  ticagrelor (BRILINTA) 90 MG TABS tablet Take 1 tablet (90 mg total) by mouth 2 (two) times daily. 08/01/22   Charlton Amor, PA-C    Physical Exam: Vitals:   08/14/22 1457 08/14/22 1500 08/14/22 1925  BP:  (!) 97/46 (!) 118/40  Pulse: (!) 59  69  Resp: 16  18  Temp: 97.8 F (36.6 C)  97.9 F (36.6 C)  TempSrc: Oral  Oral  SpO2: 96%  99%  Weight: 75.6 kg    Height: 5\' 9"  (1.753 m)     Physical Exam Vitals and nursing note reviewed.  Constitutional:      General: He is not in acute distress. HENT:     Head: Normocephalic and atraumatic.  Cardiovascular:     Rate and Rhythm: Normal rate and regular rhythm.     Heart sounds: Normal heart sounds.  Pulmonary:     Effort: Pulmonary effort is normal.     Breath sounds:  Normal breath sounds.  Abdominal:     Palpations: Abdomen is soft.     Tenderness: There is no abdominal tenderness.  Neurological:     Mental Status: He is disoriented.     Comments: Left-sided weakness     Labs on Admission: I have personally reviewed following labs and imaging studies  CBC: Recent Labs  Lab 08/14/22 1504  WBC 6.2  HGB 12.3*  HCT 38.8*  MCV 84.7  PLT 297   Basic Metabolic Panel: Recent Labs  Lab 08/14/22 1622  NA 139  K 5.4*  CL 110  CO2 20*  GLUCOSE 131*  BUN 57*  CREATININE 2.75*  CALCIUM 8.9   GFR: Estimated Creatinine Clearance: 25 mL/min (A) (by C-G formula based  on SCr of 2.75 mg/dL (H)). Liver Function Tests: Recent Labs  Lab 08/14/22 1622  AST 83*  ALT 189*  ALKPHOS 77  BILITOT 0.5  PROT 7.8  ALBUMIN 3.8   No results for input(s): "LIPASE", "AMYLASE" in the last 168 hours. No results for input(s): "AMMONIA" in the last 168 hours. Coagulation Profile: No results for input(s): "INR", "PROTIME" in the last 168 hours. Cardiac Enzymes: No results for input(s): "CKTOTAL", "CKMB", "CKMBINDEX", "TROPONINI" in the last 168 hours. BNP (last 3 results) No results for input(s): "PROBNP" in the last 8760 hours. HbA1C: No results for input(s): "HGBA1C" in the last 72 hours. CBG: No results for input(s): "GLUCAP" in the last 168 hours. Lipid Profile: No results for input(s): "CHOL", "HDL", "LDLCALC", "TRIG", "CHOLHDL", "LDLDIRECT" in the last 72 hours. Thyroid Function Tests: No results for input(s): "TSH", "T4TOTAL", "FREET4", "T3FREE", "THYROIDAB" in the last 72 hours. Anemia Panel: No results for input(s): "VITAMINB12", "FOLATE", "FERRITIN", "TIBC", "IRON", "RETICCTPCT" in the last 72 hours. Urine analysis:    Component Value Date/Time   COLORURINE YELLOW (A) 08/14/2022 1459   APPEARANCEUR HAZY (A) 08/14/2022 1459   LABSPEC 1.021 08/14/2022 1459   PHURINE 5.0 08/14/2022 1459   GLUCOSEU NEGATIVE 08/14/2022 1459   HGBUR NEGATIVE  08/14/2022 1459   BILIRUBINUR NEGATIVE 08/14/2022 1459   KETONESUR NEGATIVE 08/14/2022 1459   PROTEINUR NEGATIVE 08/14/2022 1459   NITRITE NEGATIVE 08/14/2022 1459   LEUKOCYTESUR MODERATE (A) 08/14/2022 1459    Radiological Exams on Admission: DG Chest Portable 1 View  Result Date: 08/14/2022 CLINICAL DATA:  Weakness EXAM: PORTABLE CHEST 1 VIEW COMPARISON:  07/15/2022 FINDINGS: Heart and mediastinal contours are within normal limits. No focal opacities or effusions. No acute bony abnormality. Aortic atherosclerosis. IMPRESSION: No active cardiopulmonary disease. Electronically Signed   By: Charlett Nose M.D.   On: 08/14/2022 20:03   CT HEAD WO CONTRAST ( )  Result Date: 08/14/2022 CLINICAL DATA:  TIA EXAM: CT HEAD WITHOUT CONTRAST TECHNIQUE: Contiguous axial images were obtained from the base of the skull through the vertex without intravenous contrast. RADIATION DOSE REDUCTION: This exam was performed according to the departmental dose-optimization program which includes automated exposure control, adjustment of the mA and/or kV according to patient size and/or use of iterative reconstruction technique. COMPARISON:  None Available. FINDINGS: Brain: Old left occipital infarct. Old left basal ganglia lacunar infarct. There is atrophy and chronic small vessel disease changes. No acute intracranial abnormality. Specifically, no hemorrhage, hydrocephalus, mass lesion, acute infarction, or significant intracranial injury. Vascular: No hyperdense vessel or unexpected calcification. Skull: No acute calvarial abnormality. Sinuses/Orbits: No acute findings Other: None IMPRESSION: Old left infarct as above. Atrophy, chronic microvascular disease. No acute intracranial abnormality. Electronically Signed   By: Charlett Nose M.D.   On: 08/14/2022 15:29     Data Reviewed: Relevant notes from primary care and specialist visits, past discharge summaries as available in EHR, including Care Everywhere. Prior  diagnostic testing as pertinent to current admission diagnoses Updated medications and problem lists for reconciliation ED course, including vitals, labs, imaging, treatment and response to treatment Triage notes, nursing and pharmacy notes and ED provider's notes Notable results as noted in HPI   Assessment and Plan: * Acute CVA (cerebrovascular accident) [facility]) History of bilateral cerebellar infarcts 07/16/2022 s/p balloon angioplasty for posterior circulation occlusion Continue aspirin and Brilinta Seen by teleneurology with following recommendations MRI head without contrast CTA head and neck with contrast Lipid panel, hemoglobin A1c Hemoglobin A1c Brilinta and Aspirin 81 mg PO daily,  statins for LDL goal less than 70 IV Fluids, avoid dextrose containing fluids, Maintain euglycemiaNeuro checks q4 hrs x 24 hrs and then per shiftHead of bed 30 degrees Continue with Telemetry Recommend Speech therapy if failed dysphagia screenPhysical therapy/Occupational therapyInpatient rehab if recommended by physical/occupational therapy   Acute renal failure superimposed on stage 3a chronic kidney disease (HCC) Creatinine 2.75, up from 1.38 Prerenal and related to poor oral intake IV hydration and monitor renal function  Dementia without behavioral disturbance (HCC) Continue donepezil and memantine Delirium precautions  Type 2 diabetes mellitus with peripheral neuropathy (HCC) Sliding scale insulin coverage  Primary hypertension Hold lisinopril and amlodipine to allow for permissive hypertension    DVT prophylaxis: Lovenox  Consults: Neurology Dr. Otelia Limes  Advance Care Planning:   Code Status: Prior   Family Communication: Wife at bedside  Disposition Plan: Back to previous home environment  Severity of Illness: The appropriate patient status for this patient is INPATIENT. Inpatient status is judged to be reasonable and necessary in order to provide the required intensity of  service to ensure the patient's safety. The patient's presenting symptoms, physical exam findings, and initial radiographic and laboratory data in the context of their chronic comorbidities is felt to place them at high risk for further clinical deterioration. Furthermore, it is not anticipated that the patient will be medically stable for discharge from the hospital within 2 midnights of admission.   * I certify that at the point of admission it is my clinical judgment that the patient will require inpatient hospital care spanning beyond 2 midnights from the point of admission due to high intensity of service, high risk for further deterioration and high frequency of surveillance required.*  Author: Andris Baumann, MD 08/14/2022 10:12 PM  For on call review [email or web address].

## 2022-08-14 NOTE — Consult Note (Signed)
[facility] TeleNeurology Consult Services  Stat Consult  Patient Name:   Mitchell Washington, Mitchell Washington Date of Birth:   [birth year] Identification Number:   MRN - [id number] Date of Service:   08/14/2022 19:48:19  Diagnosis:       I63.9 - Cerebrovascular accident (CVA), unspecified mechanism (HCC)  Impression Patient likely suffered a recurrent stroke. MRI of the brain repeat is currently pending Patient is out of the time window for IV thrombolytic therapy as well as endovascular therapy. I would still consider repeating the CTA of the head and neck though to assess the status of both of the regions that had the angioplasty performed.  I was able to review per the electronic medical records the procedure that the patient underwent. It appears the patient underwent status post balloon angioplasty x 2 involving the mid basilar artery on the left vertebrobasilar junction.  I would keep the patient on a low-dose aspirin and Brilinta for now.        Recommendations: Our recommendations are outlined below.  Diagnostic Studies : MRI head without contrast Study/results pending CTA head and neck with contrast Please order  Laboratory Studies : Lipid panel Please order Hemoglobin A1c Please order  Antithrombotic Medication : Aspirin 81 mg PO dailyStatins for LDL goal less than 70 Please order  Anticoagulant Medication : Not indicated  Nursing Recommendations : IV Fluids, avoid dextrose containing fluids, Maintain euglycemiaNeuro checks q4 hrs x 24 hrs and then per shiftHead of bed 30 degreesContinue with Telemetry  Consultations : Recommend Speech therapy if failed dysphagia screenPhysical therapy/Occupational therapyInpatient rehab if recommended by physical/occupational therapy  DVT Prophylaxis : SCDs, Pneumatic Compression  Disposition : Neurology will  follow   ----------------------------------------------------------------------------------------------------   Advanced Imaging: Advanced Imaging Deferred because:  Does not meet criteria due to being out of the 24-hour window for thrombectomy    Metrics: [facility] Notification Time: 08/14/2022 19:46:50 Stamp Time: 08/14/2022 19:48:19 Callback Response Time: 08/14/2022 19:48:37  Primary Provider Notified of Diagnostic Impression and Management Plan on: 08/14/2022 20:25:22   CT HEAD: As Per Radiologist CT Head Showed No Acute Hemorrhage or Acute Core Infarct    ----------------------------------------------------------------------------------------------------  Chief Complaint: stroke  History of Present Illness: Patient is a 71 year old Male. 71 year old male who was discharged on 12/15 after presenting to the hospital on 11/27 with dizziness and left-sided weakness, slurred speech and a gait ataxia.. On CTA found to have significant bilateral proximal PCA occlusions. Patient underwent arteriogram and balloon angioplasty x 2 per interventional radiology. MRI of the brain did reveal multiple small infarcts in both cerebellar hemispheres right greater than left. Also had a pontine infarct and an echocardiogram was unremarkable. Patient was discharged on aspirin low-dose and Brilinta. According to family when he was discharged he had very minimal residual deficits. He was able to move his left arm and left leg without difficulty and his speech was not slurred.  He does have a history of dementia and at baseline does not follow commands well though. Today family noted that he appeared to have difficulty moving his left arm and left leg. Last known normal time was on Christmas day.    Past Medical History:      Hypertension      Hyperlipidemia      Stroke      There is no history of Diabetes Mellitus      There is no history of Atrial Fibrillation      There is no  history of Coronary Artery Disease  There is no history of Covid-19      There is no history of Seizures      There is no history of Migraine Headaches  Medications:  No Anticoagulant use  Antiplatelet use: Yes ASA and Brilinta Reviewed EMR for current medications  Allergies:  Reviewed  Social History: Drug Use: No  Family History:  There is no family history of premature cerebrovascular disease pertinent to this consultation  ROS : 14 Points Review of Systems was performed and was negative except mentioned in HPI.  Past Surgical History: There Is No Surgical History Contributory To Today's Visit    Examination: BP(97/64), Pulse(56), 1A: Level of Consciousness - Alert; keenly responsive + 0 1B: Ask Month and Age - Both Questions Right + 0 1C: Blink Eyes & Squeeze Hands - Performs Both Tasks + 0 2: Test Horizontal Extraocular Movements - Normal + 0 3: Test Visual Fields - No Visual Loss + 0 4: Test Facial Palsy (Use Grimace if Obtunded) - Minor paralysis (flat nasolabial fold, smile asymmetry) + 1 5A: Test Left Arm Motor Drift - No Effort Against Gravity + 3 5B: Test Right Arm Motor Drift - No Drift for 10 Seconds + 0 6A: Test Left Leg Motor Drift - No Effort Against Gravity + 3 6B: Test Right Leg Motor Drift - No Drift for 5 Seconds + 0 7: Test Limb Ataxia (FNF/Heel-Shin) - No Ataxia + 0 8: Test Sensation - Mild-Moderate Loss: Can Sense Being Touched + 1 9: Test Language/Aphasia - Normal; No aphasia + 0 10: Test Dysarthria - Mild-Moderate Dysarthria: Slurring but can be understood + 1 11: Test Extinction/Inattention - No abnormality + 0  NIHSS Score: 29  Spoke with : ED    Patient / Family was informed the Neurology Consult would occur via TeleHealth consult by way of interactive audio and video telecommunications and consented to receiving care in this manner.  Patient is being evaluated for possible acute neurologic impairment and high probability of  imminent or life - threatening deterioration.I spent total of 35 minutes providing care to this patient, including time for face to face visit via telemedicine, review of medical records, imaging studies and discussion of findings with providers, the patient and / or family.   Dr Glena Norfolk   [facility] For Inpatient follow-up with [facility] physician please call [facility] [phone]. This is not an outpatient service. Post hospital discharge, please contact hospital directly.  Please do not communicate with [facility] physicians via secure chat. If you have any questions, Please contact [facility].

## 2022-08-14 NOTE — ED Provider Notes (Addendum)
[facility] Provider Note    Event Date/Time   First MD Initiated Contact with Patient 08/14/22 1927     (approximate)   History   Dehydration   HPI  Mitchell Washington is a 71 y.o. male status post recent complex hospitalization for CVA status post balloon angioplasty for posterior circulation occlusion Re presents to the ER with wife due to left-sided weakness and dragging his legs since Christmas.  States been compliant with his medications.  Wife thought that he just overdid it and was feeling weak from going on long walks but has also had poor p.o. intake over the past few days.  Patient after discharge from rehab had good strength and was able to walk on his own.  For the past 2 days has not been able to walk without significant assistance.     Physical Exam   Triage Vital Signs: ED Triage Vitals  Enc Vitals Group     BP 08/14/22 1500 (!) 97/46     Pulse Rate 08/14/22 1457 (!) 59     Resp 08/14/22 1457 16     Temp 08/14/22 1457 97.8 F (36.6 C)     Temp Source 08/14/22 1457 Oral     SpO2 08/14/22 1457 96 %     Weight 08/14/22 1457 166 lb 10.7 oz (75.6 kg)     Height 08/14/22 1457 5\' 9"  (1.753 m)     Head Circumference --      Peak Flow --      Pain Score 08/14/22 1457 0     Pain Loc --      Pain Edu? --      Excl. in GC? --     Most recent vital signs: Vitals:   08/14/22 1500 08/14/22 1925  BP: (!) 97/46 (!) 118/40  Pulse:  69  Resp:  18  Temp:  97.9 F (36.6 C)  SpO2:  99%     Constitutional: Alert  Eyes: Conjunctivae are normal.  Head: Atraumatic. Nose: No congestion/rhinnorhea. Mouth/Throat: Mucous membranes are moist.   Neck: Painless ROM.  Cardiovascular:   Good peripheral circulation. Respiratory: Normal respiratory effort.  No retractions.  Gastrointestinal: Soft and nontender.  Musculoskeletal:  no deformity Neurologic: Definite weakness of left upper and left lower extremities.  Sensation is intact.  No facial  droop.  Following commands.  No other identifiable deficit. Skin:  Skin is warm, dry and intact. No rash noted. Psychiatric: Mood and affect are normal. Speech and behavior are normal.    ED Results / Procedures / Treatments   Labs (all labs ordered are listed, but only abnormal results are displayed) Labs Reviewed  CBC - Abnormal; Notable for the following components:      Result Value   Hemoglobin 12.3 (*)    HCT 38.8 (*)    All other components within normal limits  COMPREHENSIVE METABOLIC PANEL - Abnormal; Notable for the following components:   Potassium 5.4 (*)    CO2 20 (*)    Glucose, Bld 131 (*)    BUN 57 (*)    Creatinine, Ser 2.75 (*)    AST 83 (*)    ALT 189 (*)    GFR, Estimated 24 (*)    All other components within normal limits  RESP PANEL BY RT-PCR (RSV, FLU A&B, COVID)  [id number]  URINALYSIS, ROUTINE W REFLEX MICROSCOPIC     EKG  ED ECG REPORT I, 08/16/22, the attending physician, personally viewed and interpreted this  ECG.   Date: 08/14/2022  EKG Time: 15:06  Rate: 70  Rhythm: sinus  Axis: left  Intervals: normal  ST&T Change: non specific st and t wave abn    RADIOLOGY Patient presenting to the ER for evaluation of symptoms as described above.  Based on symptoms, risk factors and considered above differential, this presenting complaint could reflect a potentially life-threatening illness therefore the patient will be placed on continuous pulse oximetry and telemetry for monitoring.  Laboratory evaluation will be sent to evaluate for the above complaints.      PROCEDURES:  Critical Care performed: No  Procedures   MEDICATIONS ORDERED IN ED: Medications  sodium chloride 0.9 % bolus 500 mL (500 mLs Intravenous New Bag/Given 08/14/22 1512)     IMPRESSION / MDM / ASSESSMENT AND PLAN / ED COURSE  I reviewed the triage vital signs and the nursing notes.                              Differential diagnosis includes, but is not  limited to, cva, tia, hypoglycemia, dehydration, electrolyte abnormality, dissection, sepsis  Patient presenting to the ER for evaluation of symptoms as described above.  Based on symptoms, risk factors and considered above differential, this presenting complaint could reflect a potentially life-threatening illness therefore the patient will be placed on continuous pulse oximetry and telemetry for monitoring.  Laboratory evaluation will be sent to evaluate for the above complaints.  Patient with laboratory evidence of AKI.  Will give IV fluids.  CT imaging without acute findings but on exam it does appear the patient has development of significant left-sided weakness secondary to stroke.  Last known well was on the 25th.  Not a candidate for TNK or PCI.  He has been compliant with his medications.   Will consult hospitalist for admission.   Clinical Course as of 08/14/22 1957  Wed Aug 14, 2022  1957 Chest x-ray my review and interpretation does not show any evidence of consolidation or infiltrate. [PR]    Clinical Course User Index [PR] Willy Eddy, MD     FINAL CLINICAL IMPRESSION(S) / ED DIAGNOSES   Final diagnoses:  Left-sided weakness  AKI (acute kidney injury) (HCC)     Rx / DC Orders   ED Discharge Orders     None        Note:  This document was prepared using Dragon voice recognition software and may include unintentional dictation errors.    Willy Eddy, MD 08/14/22 Brooke Pace    Willy Eddy, MD 08/14/22 [phone]

## 2022-08-14 NOTE — Assessment & Plan Note (Addendum)
Creatinine 2.75, up from 1.38, Due to hypotension.  Getting IV fluids.  Recheck labs in the morning

## 2022-08-14 NOTE — Assessment & Plan Note (Signed)
Holding home medications due to hypotension

## 2022-08-14 NOTE — ED Notes (Signed)
Wife reported earlier that patient has left side weakness since 12/26.  Pt has unequal grips.  Pt unable to lift left leg.  Pt denies headache.  No n/v

## 2022-08-14 NOTE — Assessment & Plan Note (Signed)
Sliding scale insulin coverage 

## 2022-08-14 NOTE — Progress Notes (Signed)
PHARMACIST - PHYSICIAN COMMUNICATION  CONCERNING:  Enoxaparin (Lovenox) for DVT Prophylaxis    RECOMMENDATION: Patient was prescribed enoxaprin 40mg  q24 hours for VTE prophylaxis.   Filed Weights   08/14/22 1457  Weight: 75.6 kg (166 lb 10.7 oz)    Body mass index is 24.61 kg/m.  Estimated Creatinine Clearance: 25 mL/min (A) (by C-G formula based on SCr of 2.75 mg/dL (H)).  Patient is candidate for enoxaparin 30mg  every 24 hours based on CrCl <38ml/min or Weight <45kg  DESCRIPTION: Pharmacy has adjusted enoxaparin dose per [facility] policy.  Patient is now receiving enoxaparin 30 mg every 24 hours   31m, PharmD, [facility] 08/14/2022 10:50 PM

## 2022-08-14 NOTE — Assessment & Plan Note (Addendum)
Initially thought to be new CVA, but MRI confirms only extension of old CVA.  Given that patient initially presented with borderline hypotension, this is likely exacerbation of symptoms brought on by previous CVA from hypotension.  Neurology following.  Will carefully limit home medications.    Patient seen by PT and OT who are recommending an patient rehab.  To be evaluated.

## 2022-08-15 ENCOUNTER — Encounter: Payer: Self-pay | Admitting: Internal Medicine

## 2022-08-15 ENCOUNTER — Ambulatory Visit: Payer: Medicare HMO | Admitting: Podiatry

## 2022-08-15 DIAGNOSIS — F0390 Unspecified dementia without behavioral disturbance: Secondary | ICD-10-CM

## 2022-08-15 DIAGNOSIS — R531 Weakness: Secondary | ICD-10-CM

## 2022-08-15 DIAGNOSIS — Z8673 Personal history of transient ischemic attack (TIA), and cerebral infarction without residual deficits: Secondary | ICD-10-CM | POA: Diagnosis not present

## 2022-08-15 DIAGNOSIS — N179 Acute kidney failure, unspecified: Secondary | ICD-10-CM

## 2022-08-15 LAB — LIPID PANEL
Cholesterol: 71 mg/dL (ref 0–200)
HDL: 29 mg/dL — ABNORMAL LOW (ref >40)
LDL Cholesterol: 28 mg/dL (ref 0–99)
Total CHOL/HDL Ratio: 2.4 RATIO
Triglycerides: 72 mg/dL (ref <150)
VLDL: 14 mg/dL (ref 0–40)

## 2022-08-15 LAB — CBG MONITORING, ED
Glucose-Capillary: 73 mg/dL (ref 70–99)
Glucose-Capillary: 183 mg/dL — ABNORMAL HIGH (ref 70–99)
Glucose-Capillary: 130 mg/dL — ABNORMAL HIGH (ref 70–99)
Glucose-Capillary: 102 mg/dL — ABNORMAL HIGH (ref 70–99)

## 2022-08-15 MED ORDER — SODIUM CHLORIDE 0.9 % IV SOLN: INTRAVENOUS | Status: DC

## 2022-08-15 NOTE — Evaluation (Signed)
Occupational Therapy Evaluation Patient Details Name: Mitchell Washington MRN: [id number] DOB: 09-22-1950 Today's Date: 08/15/2022   History of Present Illness 70 y/o male presented to ED on 08/14/22 for L sided weakness and difficulty walking. MRI showed slight expansion of previous R pontine infarct. Recent admission to [facility] 12/7-12/14 for R CVA. PMH: CVA s/p balloon angioplasty for posterior circulation occlusion on 07/16/22, T2DM, Gout, HLD , HTN ,CKD, dementia   Clinical Impression   Chart reviewed, pt greeted in room with wife present, agreeable to OT evaluation. PTA pt was performing ADL at a supervision-MIN A level per wife report after discharge from [facility]. Pt presents with deficits in strength, endurance, activity tolerance, balance, LUE function, cognition, ?vision, affecting safe and optimal ADL completion. Pt is performing ADL/functional mobility below PLOF. MOD A required for bed mobility, MOD A for STS with facilitation at LLE, blocking L knee, MOD A for lateral steps up the bed to the R with tactile/verbal cues for LLE facilitation. MAX A required for dressing tasks on this date. Pt would benefit from intensive therapy to address functional deficits, facilitate return to PLOF. Per chart, pt with excellent gains during previous rehab stay. Pt is left as received, all needs met. OT will continue to follow acutely.     Recommendations for follow up therapy are one component of a multi-disciplinary discharge planning process, led by the attending physician.  Recommendations may be updated based on patient status, additional functional criteria and insurance authorization.   Follow Up Recommendations  Acute inpatient rehab (3hours/day)     Assistance Recommended at Discharge Frequent or constant Supervision/Assistance  Patient can return home with the following A little help with walking and/or transfers;A little help with bathing/dressing/bathroom;Assistance with cooking/housework;Direct  supervision/assist for financial management;Assist for transportation;Help with stairs or ramp for entrance;Direct supervision/assist for medications management    Functional Status Assessment  Patient has had a recent decline in their functional status and demonstrates the ability to make significant improvements in function in a reasonable and predictable amount of time.  Equipment Recommendations  Other (comment) (per next venue of care)    Recommendations for Other Services Rehab consult     Precautions / Restrictions Precautions Precautions: Fall Precaution Comments: L hemi, dementia Restrictions Weight Bearing Restrictions: No      Mobility Bed Mobility Overal bed mobility: Needs Assistance Bed Mobility: Supine to Sit, Sit to Supine     Supine to sit: Mod assist, HOB elevated Sit to supine: Mod assist   General bed mobility comments: management of LLE    Transfers Overall transfer level: Needs assistance Equipment used: None Transfers: Sit to/from Stand Sit to Stand: Min assist, Mod assist (from ED stretcher)           General transfer comment: blocking at L knee, facilitation for weight shifts for 3 lateral steps up the bed with MOD A      Balance Overall balance assessment: Needs assistance Sitting-balance support: Feet supported, Single extremity supported Sitting balance-Leahy Scale: Fair     Standing balance support: Bilateral upper extremity supported Standing balance-Leahy Scale: Poor                             ADL either performed or assessed with clinical judgement   ADL Overall ADL's : Needs assistance/impaired                     Lower Body Dressing: Maximal assistance;Sit to/from stand  Lower Body Dressing Details (indicate cue type and reason): brief     Toileting- Clothing Manipulation and Hygiene: Maximal assistance;Sit to/from stand               Vision Baseline Vision/History: 1 Wears glasses Patient  Visual Report: Blurring of vision Vision Assessment?: Yes (will continue to assess) Eye Alignment: Within Functional Limits Ocular Range of Motion: Restricted on the left Tracking/Visual Pursuits: Decreased smoothness of eye movement to LEFT inferior field;Impaired - to be further tested in functional context;Requires cues, head turns, or add eye shifts to track Visual Fields: Left visual field deficit (?peripheral vision deficits) Additional Comments: will continue to assess     Perception     Praxis      Pertinent Vitals/Pain Pain Assessment Pain Assessment: Faces Faces Pain Scale: Hurts little more Pain Location: generalized Pain Descriptors / Indicators: Aching, Discomfort, Grimacing Pain Intervention(s): Monitored during session, Repositioned     Hand Dominance Right   Extremity/Trunk Assessment Upper Extremity Assessment Upper Extremity Assessment: LUE deficits/detail LUE Deficits / Details: flaccid LUE- MAS 1 throughout shoulder, elbow; 1+ in wrist flexion/extension LUE Sensation: decreased light touch LUE Coordination: decreased fine motor;decreased gross motor   Lower Extremity Assessment Lower Extremity Assessment: Defer to PT evaluation;Generalized weakness LLE Deficits / Details: grossly ~3/5 LLE Coordination: decreased fine motor   Cervical / Trunk Assessment Cervical / Trunk Assessment: Kyphotic   Communication Communication Communication: Expressive difficulties   Cognition Arousal/Alertness: Awake/alert Behavior During Therapy: Flat affect Overall Cognitive Status: Impaired/Different from baseline Area of Impairment: Orientation, Attention, Memory, Following commands, Safety/judgement, Awareness, Problem solving                 Orientation Level: Disoriented to, Time, Situation Current Attention Level: Focused   Following Commands: Follows one step commands with increased time, Follows multi-step commands inconsistently Safety/Judgement:  Decreased awareness of deficits Awareness: Intellectual Problem Solving: Slow processing, Decreased initiation, Difficulty sequencing, Requires verbal cues, Requires tactile cues General Comments: Wife reports she feels speech is "quieter", CLOX assessment administered with pt scoring 3/15 CLOX I, 1/15 CLOX II (drew circle with 1:45 in it for CLOX 1), drew circle for CLOX II     General Comments  vital signs appear stable throughout    Exercises Other Exercises Other Exercises: edu pt and wife re: discharge recommendations, role of therapy (pt/wife well aware)   Shoulder Instructions      Home Living Family/patient expects to be discharged to:: Private residence Living Arrangements: Spouse/significant other Available Help at Discharge: Family;Available 24 hours/day Type of Home: House Home Access: Stairs to enter [employer] of Steps: 3 Entrance Stairs-Rails: Can reach both;Left;Right Home Layout: One level     Bathroom Shower/Tub: Occupational psychologist: Standard Bathroom Accessibility: Yes How Accessible: Accessible via walker;Accessible via wheelchair Home Equipment: Shower seat;Cane - single point;Rolling Environmental consultant (2 wheels);BSC/3in1      Lives With: Spouse    Prior Functioning/Environment Prior Level of Function : Independent/Modified Independent;Needs assist             Mobility Comments: pt wife report pt amb with RW at a supervision level, was recieving HH therapy after discharge from [facility] ADLs Comments: MIN A for ADLs, prior to initial stroke pt was performing ADLs with supervision-MOD I; assist for IADLs from wife        OT Problem List: Decreased strength;Decreased range of motion;Impaired balance (sitting and/or standing);Decreased safety awareness;Impaired vision/perception;Decreased knowledge of use of DME or AE;Decreased coordination;Pain;Impaired sensation;Impaired UE functional use;Decreased  cognition      OT  Treatment/Interventions: Self-care/ADL training;Therapeutic exercise;DME and/or AE instruction;Therapeutic activities;Patient/family education    OT Goals(Current goals can be found in the care plan section) Acute Rehab OT Goals Patient Stated Goal: improve function OT Goal Formulation: With patient/family Time For Goal Achievement: 08/29/22 Potential to Achieve Goals: Good ADL Goals Pt Will Perform Grooming: with supervision;sitting Pt Will Perform Lower Body Dressing: with supervision Pt Will Transfer to Toilet: with min assist Pt Will Perform Toileting - Clothing Manipulation and hygiene: with min assist;sit to/from stand Pt/caregiver will Perform Home Exercise Program: Increased ROM;Left upper extremity;With written HEP provided;With Supervision  OT Frequency: Min 2X/week    Co-evaluation              AM-PAC OT "6 Clicks" Daily Activity     Outcome Measure Help from another person eating meals?: A Little Help from another person taking care of personal grooming?: A Lot Help from another person toileting, which includes using toliet, bedpan, or urinal?: A Lot Help from another person bathing (including washing, rinsing, drying)?: A Lot Help from another person to put on and taking off regular upper body clothing?: A Lot Help from another person to put on and taking off regular lower body clothing?: A Lot 6 Click Score: 13   End of Session Nurse Communication: Mobility status  Activity Tolerance: Patient tolerated treatment well Patient left: in bed;with call bell/phone within reach;with family/visitor present  OT Visit Diagnosis: Unsteadiness on feet (R26.81);Other abnormalities of gait and mobility (R26.89);Muscle weakness (generalized) (M62.81);Hemiplegia and hemiparesis Hemiplegia - Right/Left: Left Hemiplegia - dominant/non-dominant: Non-Dominant Hemiplegia - caused by: Cerebral infarction                Time: 6767-2094 OT Time Calculation (min): 18 min Charges:  OT  General Charges $OT Visit: 1 Visit OT Evaluation $OT Eval Moderate Complexity: 1 Mod  Shanon Payor, OTD OTR/L  08/15/22, 1:40 PM

## 2022-08-15 NOTE — Evaluation (Signed)
Speech Language Pathology Evaluation Patient Details Name: Mitchell Washington MRN: [id number] DOB: 1951/07/02 Today's Date: 08/15/2022 Time: 7530-0511 SLP Time Calculation (min) (ACUTE ONLY): 12 min  Problem List:  Patient Active Problem List   Diagnosis Date Noted   Acute CVA (cerebrovascular accident) (HCC) 08/14/2022   Acute renal failure superimposed on stage 3b chronic kidney disease (HCC) 08/14/2022   Dementia without behavioral disturbance (HCC) 08/14/2022   History of multiple strokes 08/14/2022   Posterior circulation stroke (HCC) 07/24/2022   Basilar artery stenosis with infarction (HCC) 07/18/2022   TIA (transient ischemic attack) 07/16/2022   Acute focal neurological deficit 07/15/2022   B12 deficiency 09/06/2019   Loss of memory 03/24/2018   Primary hypertension 01/15/2018   Cerebrovascular disease 01/15/2018   CKD (chronic kidney disease) stage 3, GFR 30-59 ml/min (HCC) 01/15/2018   Pure hypercholesterolemia 01/15/2018   Type 2 diabetes mellitus with peripheral neuropathy (HCC) 01/15/2018   Past Medical History:  Past Medical History:  Diagnosis Date   CVA (cerebral infarction)    Diabetes mellitus without complication (HCC)    Gout    Hyperlipidemia    Hypertension    Memory loss    Stroke [facility])    Wears dentures    partial upper   Past Surgical History:  Past Surgical History:  Procedure Laterality Date   APPENDECTOMY     CATARACT EXTRACTION W/PHACO Left 10/25/2019   Procedure: CATARACT EXTRACTION PHACO AND INTRAOCULAR LENS PLACEMENT (IOC) LEFT DIABETIC INTRAVEITREAL KENALOG INJECTION 1.93  00:22.6;  Surgeon: Nevada Crane, MD;  Location: [facility];  Service: Ophthalmology;  Laterality: Left;  Diabetic - oral meds   CATARACT EXTRACTION W/PHACO Right 11/15/2019   Procedure: CATARACT EXTRACTION PHACO AND INTRAOCULAR LENS PLACEMENT (IOC) RIGHT DIABETIC;  Surgeon: Nevada Crane, MD;  Location: [facility];  Service: Ophthalmology;   Laterality: Right;  0.92 0 ;19.7   IR ANGIO INTRA EXTRACRAN SEL COM CAROTID INNOMINATE BILAT MOD SED  07/16/2022   IR ANGIO VERTEBRAL SEL SUBCLAVIAN INNOMINATE UNI R MOD SED  07/16/2022   IR ANGIO VERTEBRAL SEL VERTEBRAL UNI L MOD SED  07/16/2022   IR ANGIO VERTEBRAL SEL VERTEBRAL UNI L MOD SED  07/22/2022   IR CT HEAD LTD  07/18/2022   IR PTA INTRACRANIAL  07/18/2022   RADIOLOGY WITH ANESTHESIA N/A 07/18/2022   Procedure: Cerebral angioplasty with possible stenting;  Surgeon: Julieanne Cotton, MD;  Location: [facility] OR;  Service: Radiology;  Laterality: N/A;   HPI:      Assessment / Plan / Recommendation Clinical Impression  Pt seen for speech/language evaluation. Pt alert, pleasant, and cooperative. Pt with known hx of dysathria and "stuttering" per chart review. Speech intelligibility fluctuates due to atypical rate, rhythm, sound repetitions, and phonatory breaks as well as articulatory imprecision. L facial weakness/reduced ROM apparent on oral motor examination with flattening of L nasolabial fold and sluggish excursion with labial retraction.  Cognitive and language function were briefly screened given the apparent motor speech deficit.  Pt able to follow commands and perform basic naming tasks. Content was appropriate though intelligibility reduced. Pt oriented to self, month, and loosely location [facility]"). Wife not present to provide additional details of baseline level of functioning. Recommend intensive speech therapy - recommendations from PT are AIR; agree this venue would be most beneficial. SLP to f/u per POC for continued SLP services. Based on today's assessment, anticipate need for constant supervision at d/c.    SLP Assessment  SLP Recommendation/Assessment: Patient needs continued Speech Lanaguage  Pathology Services SLP Visit Diagnosis: Cognitive communication deficit [phone])    Recommendations for follow up therapy are one component of a multi-disciplinary discharge  planning process, led by the attending physician.  Recommendations may be updated based on patient status, additional functional criteria and insurance authorization.    Follow Up Recommendations  Acute inpatient rehab (3hours/day)    Assistance Recommended at Discharge  Frequent or constant Supervision/Assistance  Functional Status Assessment Patient has had a recent decline in their functional status and demonstrates the ability to make significant improvements in function in a reasonable and predictable amount of time.  Frequency and Duration min 2x/week  2 weeks      SLP Evaluation Cognition  Overall Cognitive Status: Impaired/Different from baseline Arousal/Alertness: Awake/alert       Comprehension  Auditory Comprehension Overall Auditory Comprehension: Appears within functional limits for tasks assessed Visual Recognition/Discrimination Discrimination: Not tested Reading Comprehension Reading Status: Not tested    Expression Expression Primary Mode of Expression: Verbal Verbal Expression Overall Verbal Expression: Appears within functional limits for tasks assessed Written Expression Dominant Hand: Right   Oral / Motor  Oral Motor/Sensory Function Overall Oral Motor/Sensory Function: Mild impairment Facial Symmetry: Abnormal symmetry left Facial Strength: Reduced left Motor Speech Overall Motor Speech: Impaired at baseline (hx of dysarthria) Respiration: Within functional limits Phonation: Low vocal intensity (phonatory breaks) Resonance: Within functional limits Articulation: Impaired Level of Impairment: Phrase Intelligibility: Intelligibility reduced Word: 75-100% accurate Phrase: 75-100% accurate Sentence: 50-74% accurate Motor Planning: Impaired Level of Impairment: Phrase Motor Speech Errors: Consistent (repetitions, irregular rhythm) Effective Techniques: Increased vocal intensity;Slow rate           [name], M.S., CCC-SLP Speech-Language  Pathologist [facility] [phone] Arnette Felts)  Woodroe Chen 08/15/2022, 12:45 PM

## 2022-08-15 NOTE — Hospital Course (Signed)
71 year old male with past medical history of diabetes mellitus type 2, CKD 3B, hypertension, dementia and previous CVA most recently 1 month prior with multiple bilateral cerebellar infarcts status post balloon angioplasty for posterior circulation occlusion who presented to the emergency room on 12/27 with left-sided weakness.  In the emergency room, noted to be borderline hypotensive with a systolic blood pressure of 97 and AKI with creatinine 2.75 and GFR of 24.  Teleneurology initial evaluation with concerns for new CVA.  CT scan of head unremarkable.  Patient brought in for further evaluation.  MRI revealed no evidence of acute new CVA, but did note slight interval expansion of previously seen right pontine infarct with further posterior extension towards floor of fourth ventricle.

## 2022-08-15 NOTE — Progress Notes (Signed)
[facility] Progress Note  Patient: Mitchell Washington    RUE:[id number]  DOA: 08/14/2022    Date of Service: the patient was seen and examined on 08/15/2022  Brief hospital course: 71 year old male with past medical history of diabetes mellitus type 2, CKD 3B, hypertension, dementia and previous CVA most recently 1 month prior with multiple bilateral cerebellar infarcts status post balloon angioplasty for posterior circulation occlusion who presented to the emergency room on 12/27 with left-sided weakness.  In the emergency room, noted to be borderline hypotensive with a systolic blood pressure of 97 and AKI with creatinine 2.75 and GFR of 24.  Teleneurology initial evaluation with concerns for new CVA.  CT scan of head unremarkable.  Patient brought in for further evaluation.  MRI revealed no evidence of acute new CVA, but did note slight interval expansion of previously seen right pontine infarct with further posterior extension towards floor of fourth ventricle.   Assessment and Plan: * Left-sided weakness Initially thought to be new CVA, but MRI confirms only extension of old CVA.  Given that patient initially presented with borderline hypotension, this is likely exacerbation of symptoms brought on by previous CVA from hypotension.  Neurology following.  Will carefully limit home medications.    Patient seen by PT and OT who are recommending an patient rehab.  To be evaluated.  History of multiple strokes History of bilateral cerebellar infarcts 07/16/2022 s/p balloon angioplasty for posterior circulation occlusion Continue aspirin and Brilinta  Acute renal failure superimposed on stage 3b chronic kidney disease (HCC) Creatinine 2.75, up from 1.38, Due to hypotension.  Getting IV fluids.  Recheck labs in the morning  Dementia without behavioral disturbance (HCC) Continue donepezil and memantine Delirium precautions  Primary hypertension Holding home medications due to  hypotension  Type 2 diabetes mellitus with peripheral neuropathy (HCC) Sliding scale insulin coverage       Body mass index is 24.61 kg/m.        Consultants: Neurology  Procedures: None  Antimicrobials: None  Code Status: DNR   Subjective: Patient interactive, currently denies any complaints  Objective: Blood pressure better Vitals:   08/15/22 1500 08/15/22 1838  BP: 135/63 130/60  Pulse:  60  Resp:  19  Temp:  98 F (36.7 C)  SpO2:  100%   No intake or output data in the 24 hours ending 08/15/22 1924 Filed Weights   08/14/22 1457  Weight: 75.6 kg   Body mass index is 24.61 kg/m.  Exam:  General: Oriented x 1-2, no acute distress HEENT: Normocephalic, atraumatic, mucous membranes slightly dry Cardiovascular: Regular rate and rhythm, S1-S2 Respiratory: Clear to auscultation bilaterally Abdomen: Soft, nontender, nondistended, positive bowel sounds Musculoskeletal: No clubbing or cyanosis, trace pitting edema Skin: No skin breaks, tears or lesions Psychiatry: Chronic underlying dementia, but no evidence of acute psychoses Neurology: Left-sided weakness  Data Reviewed: Labs today pending  Disposition:  Status is: Inpatient Remains inpatient appropriate because:  -Improvement in renal function -Evaluation by CIR    Anticipated discharge date: 12/30  Family Communication: Updated wife by phone DVT Prophylaxis: enoxaparin (LOVENOX) injection 30 mg Start: 08/15/22 0800    Author: Hollice Espy ,MD 08/15/2022 7:24 PM  To reach On-call, see care teams to locate the attending and reach out via [email or web address]. Between 7PM-7AM, please contact night-coverage If you still have difficulty reaching the attending provider, please page the [facility] (Director on Call) for [facility] on amion for assistance.

## 2022-08-15 NOTE — Evaluation (Signed)
Physical Therapy Evaluation Patient Details Name: Mitchell Washington MRN: [id number] DOB: 08/15/1951 Today's Date: 08/15/2022  History of Present Illness  71 y/o male presented to ED on 08/14/22 for L sided weakness and difficulty walking. MRI showed slight expansion of previous R pontine infarct. Recent admission to AIR 12/7-12/14 for R CVA. PMH: CVA s/p balloon angioplasty for posterior circulation occlusion on 07/16/22, T2DM, Gout, HLD , HTN ,CKD, dementia  Clinical Impression  Patient admitted with the above. Patient recently discharged from AIR on 12/14 at supervision-min guard level with RW. Patient lives with wife who can assist. Difficult to obtain information from patient due to hx of dementia. Patient presents with increased L sided weakness, impaired balance, decreased activity tolerance, and impaired speech. Required modA for bed mobility for L LE management primarily and repositioning. Able to stand from elevated stretcher with minA. Initially, reluctant to ambulate but agreeable to sidesteps towards [facility] with minA and RW. Following commands with increased time but appropriately. Patient will benefit from skilled PT services during acute stay to address listed deficits. Recommend AIR at discharge to maximize functional independence and safety.        Recommendations for follow up therapy are one component of a multi-disciplinary discharge planning process, led by the attending physician.  Recommendations may be updated based on patient status, additional functional criteria and insurance authorization.  Follow Up Recommendations Acute inpatient rehab (3hours/day)      Assistance Recommended at Discharge Frequent or constant Supervision/Assistance  Patient can return home with the following  A little help with walking and/or transfers;A little help with bathing/dressing/bathroom;Assist for transportation;Help with stairs or ramp for entrance;Assistance with cooking/housework;Direct  supervision/assist for medications management;Direct supervision/assist for financial management    Equipment Recommendations None recommended by PT  Recommendations for Other Services  Rehab consult    Functional Status Assessment Patient has had a recent decline in their functional status and demonstrates the ability to make significant improvements in function in a reasonable and predictable amount of time.     Precautions / Restrictions Precautions Precautions: Fall Precaution Comments: L hemi, dementia Restrictions Weight Bearing Restrictions: No      Mobility  Bed Mobility Overal bed mobility: Needs Assistance Bed Mobility: Supine to Sit, Sit to Supine     Supine to sit: Mod assist, HOB elevated Sit to supine: Mod assist   General bed mobility comments: assist for LLE management out of bed and trunk elevation. On return to supine, patient required assistance to bring LLE back into bed and reposition shoulders    Transfers Overall transfer level: Needs assistance Equipment used: Rolling Marybeth Dandy (2 wheels) Transfers: Sit to/from Stand, Bed to chair/wheelchair/BSC Sit to Stand: Min assist, From elevated surface   Step pivot transfers: Min assist       General transfer comment: able to stand x 2 from elevated stretcher with minA. Initially declining taking steps in room. With encouragement, patient able to take sidesteps towards [facility] with minA for balance and RW management. Declines further attempts at mobility.    Ambulation/Gait                  Stairs            Wheelchair Mobility    Modified Rankin (Stroke Patients Only) Modified Rankin (Stroke Patients Only) Pre-Morbid Rankin Score: No symptoms Modified Rankin: Moderately severe disability     Balance Overall balance assessment: Needs assistance Sitting-balance support: Feet supported, Single extremity supported Sitting balance-Leahy Scale: Fair  Standing balance support: Bilateral  upper extremity supported Standing balance-Leahy Scale: Poor                               Pertinent Vitals/Pain Pain Assessment Pain Assessment: No/denies pain    Home Living Family/patient expects to be discharged to:: Private residence Living Arrangements: Spouse/significant other Available Help at Discharge: Family;Available 24 hours/day Type of Home: House Home Access: Stairs to enter Entrance Stairs-Rails: Can reach both;Left;Right Entrance Stairs-Number of Steps: 3   Home Layout: One level Home Equipment: Shower seat;Cane - single point;Rolling Talynn Lebon (2 wheels)      Prior Function Prior Level of Function : Patient poor historian/Family not available             Mobility Comments: per chart, prior to initial CVA patient was independent.       Hand Dominance        Extremity/Trunk Assessment   Upper Extremity Assessment Upper Extremity Assessment: Defer to OT evaluation    Lower Extremity Assessment Lower Extremity Assessment: LLE deficits/detail LLE Deficits / Details: grossly ~3/5 LLE Coordination: decreased fine motor    Cervical / Trunk Assessment Cervical / Trunk Assessment: Kyphotic  Communication   Communication: Expressive difficulties  Cognition Arousal/Alertness: Awake/alert Behavior During Therapy: WFL for tasks assessed/performed Overall Cognitive Status: History of cognitive impairments - at baseline                                 General Comments: hx of dementia. Difficult to understand speech. Follows commands appropriately.        General Comments      Exercises     Assessment/Plan    PT Assessment Patient needs continued PT services  PT Problem List Decreased strength;Decreased activity tolerance;Decreased balance;Decreased mobility;Decreased coordination;Decreased knowledge of use of DME;Decreased knowledge of precautions;Decreased safety awareness;Decreased cognition       PT Treatment  Interventions DME instruction;Gait training;Functional mobility training;Therapeutic activities;Balance training;Neuromuscular re-education;Patient/family education;Stair training;Therapeutic exercise    PT Goals (Current goals can be found in the Care Plan section)  Acute Rehab PT Goals Patient Stated Goal: did not state PT Goal Formulation: Patient unable to participate in goal setting Time For Goal Achievement: 08/29/22 Potential to Achieve Goals: Fair    Frequency 7X/week     Co-evaluation               AM-PAC PT "6 Clicks" Mobility  Outcome Measure Help needed turning from your back to your side while in a flat bed without using bedrails?: A Lot Help needed moving from lying on your back to sitting on the side of a flat bed without using bedrails?: A Lot Help needed moving to and from a bed to a chair (including a wheelchair)?: A Little Help needed standing up from a chair using your arms (e.g., wheelchair or bedside chair)?: A Little Help needed to walk in hospital room?: A Lot Help needed climbing 3-5 steps with a railing? : Total 6 Click Score: 13    End of Session Equipment Utilized During Treatment: Gait belt Activity Tolerance: Patient tolerated treatment well Patient left: in bed;with call bell/phone within reach Nurse Communication: Mobility status PT Visit Diagnosis: Other symptoms and signs involving the nervous system (R29.898);Muscle weakness (generalized) (M62.81);Other abnormalities of gait and mobility (R26.89)    Time: 6256-3893 PT Time Calculation (min) (ACUTE ONLY): 24 min   Charges:  PT Evaluation $PT Eval Moderate Complexity: 1 Mod PT Treatments $Therapeutic Activity: 8-22 mins        Morgaine Kimball A. Dan Humphreys PT, DPT [facility] - Acute Rehabilitation Services   Keyry Iracheta A Hydia Copelin 08/15/2022, 10:42 AM

## 2022-08-15 NOTE — ED Notes (Signed)
Stretcher locked low, rails up and call bell within reach on pt's R side.

## 2022-08-15 NOTE — Progress Notes (Signed)
? ?  Inpatient Rehab Admissions Coordinator : ? ?Per therapy recommendations, patient was screened for CIR candidacy by Agustus Mane RN MSN.  At this time patient appears to be a potential candidate for CIR. I will place a rehab consult per protocol for full assessment. Please call me with any questions. ? ?Lupita Rosales RN MSN ?Admissions Coordinator ?[phone] ?  ?

## 2022-08-15 NOTE — ED Notes (Signed)
CBG 102 

## 2022-08-15 NOTE — Assessment & Plan Note (Signed)
History of bilateral cerebellar infarcts 07/16/2022 s/p balloon angioplasty for posterior circulation occlusion Continue aspirin and Brilinta

## 2022-08-16 DIAGNOSIS — I10 Essential (primary) hypertension: Secondary | ICD-10-CM

## 2022-08-16 DIAGNOSIS — I639 Cerebral infarction, unspecified: Secondary | ICD-10-CM | POA: Diagnosis not present

## 2022-08-16 DIAGNOSIS — E7800 Pure hypercholesterolemia, unspecified: Secondary | ICD-10-CM | POA: Diagnosis not present

## 2022-08-16 DIAGNOSIS — E1142 Type 2 diabetes mellitus with diabetic polyneuropathy: Secondary | ICD-10-CM

## 2022-08-16 DIAGNOSIS — R531 Weakness: Secondary | ICD-10-CM | POA: Diagnosis not present

## 2022-08-16 DIAGNOSIS — N1831 Chronic kidney disease, stage 3a: Secondary | ICD-10-CM

## 2022-08-16 DIAGNOSIS — N179 Acute kidney failure, unspecified: Secondary | ICD-10-CM | POA: Diagnosis not present

## 2022-08-16 LAB — BASIC METABOLIC PANEL
Anion gap: 9 (ref 5–15)
BUN: 41 mg/dL — ABNORMAL HIGH (ref 8–23)
CO2: 21 mmol/L — ABNORMAL LOW (ref 22–32)
Calcium: 9.1 mg/dL (ref 8.9–10.3)
Chloride: 112 mmol/L — ABNORMAL HIGH (ref 98–111)
Creatinine, Ser: 1.61 mg/dL — ABNORMAL HIGH (ref 0.61–1.24)
GFR, Estimated: 46 mL/min — ABNORMAL LOW (ref >60)
Glucose, Bld: 107 mg/dL — ABNORMAL HIGH (ref 70–99)
Potassium: 4.9 mmol/L (ref 3.5–5.1)
Sodium: 142 mmol/L (ref 135–145)

## 2022-08-16 LAB — CBC
HCT: 37.2 % — ABNORMAL LOW (ref 39.0–52.0)
Hemoglobin: 11.8 g/dL — ABNORMAL LOW (ref 13.0–17.0)
MCH: 26.5 pg (ref 26.0–34.0)
MCHC: 31.7 g/dL (ref 30.0–36.0)
MCV: 83.4 fL (ref 80.0–100.0)
Platelets: 261 10*3/uL (ref 150–400)
RBC: 4.46 MIL/uL (ref 4.22–5.81)
RDW: 13.9 % (ref 11.5–15.5)
WBC: 6.2 10*3/uL (ref 4.0–10.5)
nRBC: 0 % (ref 0.0–0.2)

## 2022-08-16 LAB — CBG MONITORING, ED
Glucose-Capillary: 101 mg/dL — ABNORMAL HIGH (ref 70–99)
Glucose-Capillary: 156 mg/dL — ABNORMAL HIGH (ref 70–99)
Glucose-Capillary: 102 mg/dL — ABNORMAL HIGH (ref 70–99)

## 2022-08-16 LAB — GLUCOSE, CAPILLARY: Glucose-Capillary: 107 mg/dL — ABNORMAL HIGH (ref 70–99)

## 2022-08-16 MED ORDER — ENOXAPARIN SODIUM 40 MG/0.4ML IJ SOSY
40.0000 mg | PREFILLED_SYRINGE | INTRAMUSCULAR | Status: DC
  Administered 2022-08-17 – 2022-08-22 (×6): 40 mg via SUBCUTANEOUS
  Filled 2022-08-16 (×6): qty 0.4

## 2022-08-16 NOTE — ED Notes (Signed)
ED TO INPATIENT HANDOFF REPORT  ED Nurse Name and Phone #: [phone] flex  S Name/Age/Gender Mitchell Washington 71 y.o. male Room/Bed: ED41A/ED41A  Code Status   Code Status: DNR  Home/SNF/Other Home Patient oriented to: self, place, time, and situation Is this baseline? Yes a&ox4, forgetful at times   Triage Complete: Triage complete  Chief Complaint Acute CVA (cerebrovascular accident) ([place]) [I63.9] AKI (acute kidney injury) ([place]) [N17.9]  Triage Note Pt here with dehydration. Wife states pt was fine until Christmas day when he could not walk. Pt's left side is dangling per wife.LKW was Sunday 08/11/22.   Allergies No Known Allergies  Level of Care/Admitting Diagnosis ED Disposition     ED Disposition  Admit   Condition  --   Comment  Hospital Area: [place] [[id number]]  Level of Care: Telemetry Medical [[id number]]  Covid Evaluation: Confirmed COVID Negative  Diagnosis: [facility]acute kidney injury) [id number]  Admitting Physician: Junius Argyle  Attending Physician: Annita Brod [id number]  Certification:: I certify this patient will need inpatient services for at least 2 midnights          B Medical/Surgery History Past Medical History:  Diagnosis Date   CVA (cerebral infarction)    Diabetes mellitus without complication ([place])    Gout    Hyperlipidemia    Hypertension    Memory loss    Stroke [facility])    Wears dentures    partial upper   Past Surgical History:  Procedure Laterality Date   APPENDECTOMY     CATARACT EXTRACTION W/PHACO Left 10/25/2019   Procedure: CATARACT EXTRACTION PHACO AND INTRAOCULAR LENS PLACEMENT (IOC) LEFT DIABETIC INTRAVEITREAL KENALOG INJECTION 1.93  00:22.6;  Surgeon: Eulogio Bear, MD;  Location: [place];  Service: Ophthalmology;  Laterality: Left;  Diabetic - oral meds   CATARACT EXTRACTION W/PHACO Right 11/15/2019   Procedure: CATARACT EXTRACTION PHACO AND INTRAOCULAR LENS  PLACEMENT ([place]) RIGHT DIABETIC;  Surgeon: Eulogio Bear, MD;  Location: [place];  Service: Ophthalmology;  Laterality: Right;  0.92 0 ;19.7   IR ANGIO INTRA EXTRACRAN SEL COM CAROTID INNOMINATE BILAT MOD SED  07/16/2022   IR ANGIO VERTEBRAL SEL SUBCLAVIAN INNOMINATE UNI R MOD SED  07/16/2022   IR ANGIO VERTEBRAL SEL VERTEBRAL UNI L MOD SED  07/16/2022   IR ANGIO VERTEBRAL SEL VERTEBRAL UNI L MOD SED  07/22/2022   IR CT HEAD LTD  07/18/2022   IR PTA INTRACRANIAL  07/18/2022   RADIOLOGY WITH ANESTHESIA N/A 07/18/2022   Procedure: Cerebral angioplasty with possible stenting;  Surgeon: Luanne Bras, MD;  Location: [place];  Service: Radiology;  Laterality: N/A;     A IV Location/Drains/Wounds Patient Lines/Drains/Airways Status     Active Line/Drains/Airways     Name Placement date Placement time Site Days   Peripheral IV 08/14/22 20 G 1" Anterior;Distal;Right;Upper Arm 08/14/22  1512  Arm  2   Wound / Incision (Open or Dehisced) 07/26/22 Skin tear Toe (Comment  which one) Right;Lateral 07/26/22  1130  Toe (Comment  which one)  21            Intake/Output Last 24 hours No intake or output data in the 24 hours ending 08/16/22 1638  Labs/Imaging Results for orders placed or performed during the hospital encounter of 08/14/22 (from the past 48 hour(s))  Resp panel by RT-PCR (RSV, Flu A&B, Covid) Anterior Nasal Swab     Status: None   Collection Time: 08/14/22  7:28  PM   Specimen: Anterior Nasal Swab  Result Value Ref Range   SARS Coronavirus 2 by RT PCR NEGATIVE NEGATIVE    Comment: (NOTE) SARS-CoV-2 target nucleic acids are NOT DETECTED.  The SARS-CoV-2 RNA is generally detectable in upper respiratory specimens during the acute phase of infection. The lowest concentration of SARS-CoV-2 viral copies this assay can detect is 138 copies/mL. A negative result does not preclude SARS-Cov-2 infection and should not be used as the sole basis for treatment or other  patient management decisions. A negative result may occur with  improper specimen collection/handling, submission of specimen other than nasopharyngeal swab, presence of viral mutation(s) within the areas targeted by this assay, and inadequate number of viral copies(<138 copies/mL). A negative result must be combined with clinical observations, patient history, and epidemiological information. The expected result is Negative.  Fact Sheet for Patients:  BloggerCourse.comhttps://www.fda.gov/media/152166/download  Fact Sheet for Healthcare Providers:  SeriousBroker.ithttps://www.fda.gov/media/152162/download  This test is no t yet approved or cleared by the [place] FDA and  has been authorized for detection and/or diagnosis of SARS-CoV-2 by FDA under an Emergency Use Authorization (EUA). This EUA will remain  in effect (meaning this test can be used) for the duration of the COVID-19 declaration under Section 564(b)(1) of the Act, 21 U.S.C.section 360bbb-3(b)(1), unless the authorization is terminated  or revoked sooner.       Influenza A by PCR NEGATIVE NEGATIVE   Influenza B by PCR NEGATIVE NEGATIVE    Comment: (NOTE) The Xpert Xpress SARS-CoV-2/FLU/RSV plus assay is intended as an aid in the diagnosis of influenza from Nasopharyngeal swab specimens and should not be used as a sole basis for treatment. Nasal washings and aspirates are unacceptable for Xpert Xpress SARS-CoV-2/FLU/RSV testing.  Fact Sheet for Patients: BloggerCourse.comhttps://www.fda.gov/media/152166/download  Fact Sheet for Healthcare Providers: SeriousBroker.ithttps://www.fda.gov/media/152162/download  This test is not yet approved or cleared by the [place] FDA and has been authorized for detection and/or diagnosis of SARS-CoV-2 by FDA under an Emergency Use Authorization (EUA). This EUA will remain in effect (meaning this test can be used) for the duration of the COVID-19 declaration under Section 564(b)(1) of the Act, 21 U.S.C. section 360bbb-3(b)(1), unless  the authorization is terminated or revoked.     Resp Syncytial Virus by PCR NEGATIVE NEGATIVE    Comment: (NOTE) Fact Sheet for Patients: BloggerCourse.comhttps://www.fda.gov/media/152166/download  Fact Sheet for Healthcare Providers: SeriousBroker.ithttps://www.fda.gov/media/152162/download  This test is not yet approved or cleared by the [place] FDA and has been authorized for detection and/or diagnosis of SARS-CoV-2 by FDA under an Emergency Use Authorization (EUA). This EUA will remain in effect (meaning this test can be used) for the duration of the COVID-19 declaration under Section 564(b)(1) of the Act, 21 U.S.C. section 360bbb-3(b)(1), unless the authorization is terminated or revoked.  Performed at [facility], [address] [place], [place] [id number]   CBG monitoring, ED     Status: Abnormal   Collection Time: 08/14/22 10:59 PM  Result Value Ref Range   Glucose-Capillary 143 (H) 70 - 99 mg/dL    Comment: Glucose reference range applies only to samples taken after fasting for at least 8 hours.  Lipid panel     Status: Abnormal   Collection Time: 08/15/22  4:17 AM  Result Value Ref Range   Cholesterol 71 0 - 200 mg/dL   Triglycerides 72 <604<150 mg/dL   HDL 29 (L) >54>40 mg/dL   Total CHOL/HDL Ratio 2.4 RATIO   VLDL 14 0 - 40 mg/dL  LDL Cholesterol 28 0 - 99 mg/dL    Comment:        Total Cholesterol/HDL:CHD Risk Coronary Heart Disease Risk Table                     Men   Women  1/2 Average Risk   3.4   3.3  Average Risk       5.0   4.4  2 X Average Risk   9.6   7.1  3 X Average Risk  23.4   11.0        Use the calculated Patient Ratio above and the CHD Risk Table to determine the patient's CHD Risk.        ATP III CLASSIFICATION (LDL):  <100     mg/dL   Optimal  100-129  mg/dL   Near or Above                    Optimal  130-159  mg/dL   Borderline  160-189  mg/dL   High  >190     mg/dL   Very High Performed at [facility], [address] [place], [place]  [address]   CBG monitoring, ED     Status: None   Collection Time: 08/15/22  7:10 AM  Result Value Ref Range   Glucose-Capillary 73 70 - 99 mg/dL    Comment: Glucose reference range applies only to samples taken after fasting for at least 8 hours.  CBG monitoring, ED     Status: Abnormal   Collection Time: 08/15/22 11:39 AM  Result Value Ref Range   Glucose-Capillary 183 (H) 70 - 99 mg/dL    Comment: Glucose reference range applies only to samples taken after fasting for at least 8 hours.  CBG monitoring, ED     Status: Abnormal   Collection Time: 08/15/22  4:42 PM  Result Value Ref Range   Glucose-Capillary 130 (H) 70 - 99 mg/dL    Comment: Glucose reference range applies only to samples taken after fasting for at least 8 hours.  CBG monitoring, ED     Status: Abnormal   Collection Time: 08/15/22 11:20 PM  Result Value Ref Range   Glucose-Capillary 102 (H) 70 - 99 mg/dL    Comment: Glucose reference range applies only to samples taken after fasting for at least 8 hours.  Basic metabolic panel     Status: Abnormal   Collection Time: 08/16/22  5:39 AM  Result Value Ref Range   Sodium 142 135 - 145 mmol/L   Potassium 4.9 3.5 - 5.1 mmol/L   Chloride 112 (H) 98 - 111 mmol/L   CO2 21 (L) 22 - 32 mmol/L   Glucose, Bld 107 (H) 70 - 99 mg/dL    Comment: Glucose reference range applies only to samples taken after fasting for at least 8 hours.   BUN 41 (H) 8 - 23 mg/dL   Creatinine, Ser 1.61 (H) 0.61 - 1.24 mg/dL    Comment: RESULTS VERIFIED BY REPEAT TESTING   SB   Calcium 9.1 8.9 - 10.3 mg/dL   GFR, Estimated 46 (L) >60 mL/min    Comment: (NOTE) Calculated using the CKD-EPI Creatinine Equation (2021)    Anion gap 9 5 - 15    Comment: Performed at [facility], [address] [place], [place] [address]  CBC     Status: Abnormal   Collection Time: 08/16/22  5:39 AM  Result Value Ref Range  WBC 6.2 4.0 - 10.5 K/uL   RBC 4.46 4.22 - 5.81 MIL/uL   Hemoglobin 11.8 (L) 13.0 -  17.0 g/dL   HCT 37.2 (L) 39.0 - 52.0 %   MCV 83.4 80.0 - 100.0 fL   MCH 26.5 26.0 - 34.0 pg   MCHC 31.7 30.0 - 36.0 g/dL   RDW 13.9 11.5 - 15.5 %   Platelets 261 150 - 400 K/uL   nRBC 0.0 0.0 - 0.2 %    Comment: Performed at [facility], [address] [place], [place] [address]  CBG monitoring, ED     Status: Abnormal   Collection Time: 08/16/22  7:32 AM  Result Value Ref Range   Glucose-Capillary 101 (H) 70 - 99 mg/dL    Comment: Glucose reference range applies only to samples taken after fasting for at least 8 hours.  CBG monitoring, ED     Status: Abnormal   Collection Time: 08/16/22 11:53 AM  Result Value Ref Range   Glucose-Capillary 156 (H) 70 - 99 mg/dL    Comment: Glucose reference range applies only to samples taken after fasting for at least 8 hours.   MR BRAIN WO CONTRAST  Result Date: 08/14/2022 CLINICAL DATA:  Initial evaluation for neuro deficit, stroke suspected. Recent hospitalization for stroke, status post posterior circulation angioplasty. EXAM: MRI HEAD WITHOUT CONTRAST TECHNIQUE: Multiplanar, multiecho pulse sequences of the brain and surrounding structures were obtained without intravenous contrast. COMPARISON:  Prior CT from earlier the same day as well as recent MRI from 12/17/2021. FINDINGS: Brain: Age-related cerebral atrophy. Patchy and confluent T2/FLAIR hyperintensity involving the periventricular deep white matter both cerebral hemispheres, consistent with chronic small vessel ischemic disease. Patchy involvement of the pons. Remote hemorrhagic lacunar infarct noted at the left basal ganglia. Remote bilateral PCA territory infarcts, left larger than right noted. Few scattered remote bilateral cerebellar infarcts. Previously seen small scattered cerebellar infarcts have largely resolved, with only faint residual punctate diffusion signal abnormality at the left cerebellum (series 5, image 9). There has been interval expansion of the previously a seen  right pontine infarct, slightly increased in size as compared to previous exam, with further posterior extension towards the floor of the fourth ventricle (series 5, image 15). Degree of new/interval infarction best appreciated on ADC map (series 6, image 15). No associated hemorrhage or mass effect. No other evidence for new or interval infarct elsewhere. No acute intracranial hemorrhage. No mass lesion, midline shift or mass effect. Mild ex vacuo dilatation of the left lateral ventricle related to the chronic left basal ganglia infarct. No hydrocephalus. No extra-axial fluid collection. Pituitary gland and suprasellar region within normal limits. 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 Major intracranial vascular flow voids are otherwise maintained. Skull and upper cervical spine: Craniocervical junction within normal limits. Bone marrow signal intensity heterogeneous but overall within normal limits. No scalp soft tissue abnormality. Sinuses/Orbits: Prior bilateral ocular lens replacement. Scattered mucosal thickening noted about the sphenoethmoidal and maxillary sinuses. Small right maxillary sinus retention cyst. No mastoid effusion. Other: None. IMPRESSION: 1. Slight interval expansion of the previously seen right pontine infarct, slightly increased in size as compared to previous, with further posterior extension towards the floor of the fourth ventricle. No associated hemorrhage or mass effect. 2. Previously seen scattered small cerebellar infarcts have largely resolved, with only faint residual diffusion signal abnormality at the left cerebellum. 3. No other new acute intracranial  abnormality. 4. Underlying atrophy with chronic microvascular ischemic disease, with additional remote infarcts involving the left basal ganglia, bilateral PCA territories, and bilateral cerebellar hemispheres.  Electronically Signed   By: Rise Mu M.D.   On: 08/14/2022 22:47   DG Chest Portable 1 View  Result Date: 08/14/2022 CLINICAL DATA:  Weakness EXAM: PORTABLE CHEST 1 VIEW COMPARISON:  07/15/2022 FINDINGS: Heart and mediastinal contours are within normal limits. No focal opacities or effusions. No acute bony abnormality. Aortic atherosclerosis. IMPRESSION: No active cardiopulmonary disease. Electronically Signed   By: Charlett Nose M.D.   On: 08/14/2022 20:03    Pending Labs Unresulted Labs (From admission, onward)     Start     Ordered   08/21/22 0500  Creatinine, serum  (enoxaparin (LOVENOX)    CrCl >/= 30 ml/min)  Weekly,   R     Comments: while on enoxaparin therapy    08/14/22 2241            Vitals/Pain Today's Vitals   08/16/22 0000 08/16/22 0539 08/16/22 0717 08/16/22 1236  BP: (!) 118/56 (!) 146/77 (!) 140/70 138/70  Pulse: 64 60 62 66  Resp: 18  18 18   Temp: 97.9 F (36.6 C)  98 F (36.7 C) 98 F (36.7 C)  TempSrc: Oral  Oral Oral  SpO2: 99%  99% 99%  Weight:      Height:      PainSc: 0-No pain  0-No pain 0-No pain    Isolation Precautions No active isolations  Medications Medications  aspirin EC tablet 81 mg (81 mg Oral Given 08/16/22 1040)  atorvastatin (LIPITOR) tablet 80 mg (80 mg Oral Given 08/16/22 1040)  donepezil (ARICEPT) tablet 10 mg (10 mg Oral Given 08/16/22 0003)  memantine (NAMENDA) tablet 10 mg (10 mg Oral Given 08/16/22 1040)  ticagrelor (BRILINTA) tablet 90 mg (90 mg Oral Given 08/16/22 1041)  acetaminophen (TYLENOL) tablet 650 mg (has no administration in time range)    Or  acetaminophen (TYLENOL) 160 MG/5ML solution 650 mg (has no administration in time range)    Or  acetaminophen (TYLENOL) suppository 650 mg (has no administration in time range)  enoxaparin (LOVENOX) injection 30 mg (30 mg Subcutaneous Given 08/16/22 0800)  insulin aspart (novoLOG) injection 0-15 Units (3 Units Subcutaneous Given 08/16/22 1158)  insulin  aspart (novoLOG) injection 0-5 Units ( Subcutaneous Not Given 08/15/22 2321)  sodium chloride 0.9 % bolus 500 mL (0 mLs Intravenous Stopped 08/14/22 2125)   stroke: early stages of recovery book ( Does not apply Given 08/15/22 0948)    Mobility Non-ambulatory, normally walks with device at home Low fall risk   Focused Assessments    R Recommendations: See Admitting Provider Note  Report given to:   Additional Notes: please call with any questions

## 2022-08-16 NOTE — Progress Notes (Signed)
[facility]  - [place] [facility]   PATIENT NAME: Mitchell Washington    MR#:  [id number]  DATE OF BIRTH:  10-May-1951  SUBJECTIVE:   No family at bedside. Came in with increasing left sided weakness. Recently had CVa and was in CIR   VITALS:  Blood pressure (!) 147/50, pulse 69, temperature 97.9 F (36.6 C), resp. rate 16, height 5\' 9"  (1.753 m), weight 75.6 kg, SpO2 100 %.  PHYSICAL EXAMINATION:   GENERAL:  71 y.o.-year-old patient lying in the bed with no acute distress.  LUNGS: Normal breath sounds bilaterally, no wheezing CARDIOVASCULAR: S1, S2 normal. No murmur   ABDOMEN: Soft, nontender, nondistended. Bowel sounds present.  EXTREMITIES: No  edema b/l.    NEUROLOGIC:left side hemiparesis SKIN: No obvious rash, lesion, or ulcer.   LABORATORY PANEL:  CBC Recent Labs  Lab 08/16/22 0539  WBC 6.2  HGB 11.8*  HCT 37.2*  PLT 261    Chemistries  Recent Labs  Lab 08/14/22 1622 08/16/22 0539  NA 139 142  K 5.4* 4.9  CL 110 112*  CO2 20* 21*  GLUCOSE 131* 107*  BUN 57* 41*  CREATININE 2.75* 1.61*  CALCIUM 8.9 9.1  AST 83*  --   ALT 189*  --   ALKPHOS 77  --   BILITOT 0.5  --    Cardiac Enzymes No results for input(s): "TROPONINI" in the last 168 hours. RADIOLOGY:  MR BRAIN WO CONTRAST  Result Date: 08/14/2022 CLINICAL DATA:  Initial evaluation for neuro deficit, stroke suspected. Recent hospitalization for stroke, status post posterior circulation angioplasty. EXAM: MRI HEAD WITHOUT CONTRAST TECHNIQUE: Multiplanar, multiecho pulse sequences of the brain and surrounding structures were obtained without intravenous contrast. COMPARISON:  Prior CT from earlier the same day as well as recent MRI from 12/17/2021. FINDINGS: Brain: Age-related cerebral atrophy. Patchy and confluent T2/FLAIR hyperintensity involving the periventricular deep white matter both cerebral hemispheres, consistent with chronic small vessel ischemic disease. Patchy involvement of  the pons. Remote hemorrhagic lacunar infarct noted at the left basal ganglia. Remote bilateral PCA territory infarcts, left larger than right noted. Few scattered remote bilateral cerebellar infarcts. Previously seen small scattered cerebellar infarcts have largely resolved, with only faint residual punctate diffusion signal abnormality at the left cerebellum (series 5, image 9). There has been interval expansion of the previously a seen right pontine infarct, slightly increased in size as compared to previous exam, with further posterior extension towards the floor of the fourth ventricle (series 5, image 15). Degree of new/interval infarction best appreciated on ADC map (series 6, image 15). No associated hemorrhage or mass effect. No other evidence for new or interval infarct elsewhere. No acute intracranial hemorrhage. No mass lesion, midline shift or mass effect. Mild ex vacuo dilatation of the left lateral ventricle related to the chronic left basal ganglia infarct. No hydrocephalus. No extra-axial fluid collection. Pituitary gland and suprasellar region within normal limits. 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 Major intracranial vascular flow voids are otherwise maintained. Skull and upper cervical spine: Craniocervical junction within normal limits. Bone marrow signal intensity heterogeneous but overall within normal limits. No scalp soft tissue abnormality. Sinuses/Orbits: Prior bilateral ocular lens replacement. Scattered mucosal thickening noted about the sphenoethmoidal and maxillary sinuses. Small right maxillary sinus retention cyst. No mastoid effusion. Other: None. IMPRESSION: 1. Slight interval expansion of the previously seen right pontine infarct, slightly increased in size as  compared to previous, with further posterior extension towards the floor of the fourth ventricle.  No associated hemorrhage or mass effect. 2. Previously seen scattered small cerebellar infarcts have largely resolved, with only faint residual diffusion signal abnormality at the left cerebellum. 3. No other new acute intracranial abnormality. 4. Underlying atrophy with chronic microvascular ischemic disease, with additional remote infarcts involving the left basal ganglia, bilateral PCA territories, and bilateral cerebellar hemispheres. Electronically Signed   By: Rise Mu M.D.   On: 08/14/2022 22:47   DG Chest Portable 1 View  Result Date: 08/14/2022 CLINICAL DATA:  Weakness EXAM: PORTABLE CHEST 1 VIEW COMPARISON:  07/15/2022 FINDINGS: Heart and mediastinal contours are within normal limits. No focal opacities or effusions. No acute bony abnormality. Aortic atherosclerosis. IMPRESSION: No active cardiopulmonary disease. Electronically Signed   By: Charlett Nose M.D.   On: 08/14/2022 20:03    Assessment and Plan 71 year old male with past medical history of diabetes mellitus type 2, CKD 3B, hypertension, dementia and previous CVA most recently 1 month prior with multiple bilateral cerebellar infarcts status post balloon angioplasty for posterior circulation occlusion who presented to the emergency room on 12/27 with left-sided weakness.   Left-sided weakness suspected due to extension of recent CVA (1 month ago) --Initially thought to be new CVA, but MRI confirms only extension of old CVA.   --Given that patient initially presented with borderline hypotension, this is likely exacerbation of symptoms brought on by previous CVA from hypotension.  -- Neurology following.  Will carefully limit home medications.   --cont ASA and Brilinta --cont statins --Neurology consulted   --Patient seen by PT and OT who are recommending an patient rehab.  To be evaluated.   History of multiple strokes History of bilateral cerebellar infarcts 07/16/2022 s/p balloon angioplasty for posterior  circulation occlusion --Continue aspirin and Brilinta   Acute renal failure superimposed on stage 3b chronic kidney disease (HCC) Creatinine 2.75, up from 1.38, Due to hypotension.   --creat 2.75--IVF--1.6   Dementia without behavioral disturbance (HCC) Continue donepezil and memantine Delirium precautions   Primary hypertension Holding home medications due to hypotension   Type 2 diabetes mellitus with peripheral neuropathy (HCC) Sliding scale insulin coverage       Procedures:none Family communication : Consults :Neuro CODE STATUS: DNR DVT Prophylaxis :lovenox Level of care: Telemetry Medical Status is: Inpatient Remains inpatient appropriate because: CVA.Marland Kitchen planning d/cto CIR if approved    TOTAL TIME TAKING CARE OF THIS PATIENT: 35 minutes.  >50% time spent on counselling and coordination of care  Note: This dictation was prepared with Dragon dictation along with smaller phrase technology. Any transcriptional errors that result from this process are unintentional.  Enedina Finner M.D    [facility]   CC: Primary care physician; Kandyce Rud, MD

## 2022-08-16 NOTE — Progress Notes (Signed)
Physical Therapy Treatment Patient Details Name: Mitchell Washington MRN: [id number] DOB: 1950/12/08 Today's Date: 08/16/2022   History of Present Illness 71 y/o male presented to ED on 08/14/22 for L sided weakness and difficulty walking. MRI showed slight expansion of previous R pontine infarct. Recent admission to [facility] 12/7-12/14 for R CVA. 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 Patient required modA to come to EOB for LLE management and trunk elevation. Able to stand from elevated stretcher with minA and RW. Took several fwd/bwd/lateral steps with RW and modA for balance due to L knee instability with stance. Patient not willing to progress beyond short steps fwd/bwd this session. Will continue to progress as able. Educated family on simple LE exercises to encourage LLE strengthening while therapy not present, family in agreement. Continue to recommend acute inpatient rehab (AIR) for post-acute therapy needs.     Recommendations for follow up therapy are one component of a multi-disciplinary discharge planning process, led by the attending physician.  Recommendations may be updated based on patient status, additional functional criteria and insurance authorization.  Follow Up Recommendations  Acute inpatient rehab (3hours/day)     Assistance Recommended at Discharge Frequent or constant Supervision/Assistance  Patient can return home with the following A little help with bathing/dressing/bathroom;Assist for transportation;Help with stairs or ramp for entrance;Assistance with cooking/housework;Direct supervision/assist for medications management;Direct supervision/assist for financial management;A lot of help with walking and/or transfers   Equipment  Recommendations  None recommended by PT    Recommendations for Other Services Rehab consult     Precautions / Restrictions Precautions Precautions: Fall Precaution Comments: L hemi, dementia Restrictions Weight Bearing Restrictions: No     Mobility  Bed Mobility Overal bed mobility: Needs Assistance Bed Mobility: Supine to Sit, Sit to Supine     Supine to sit: Mod assist, HOB elevated Sit to supine: Mod assist   General bed mobility comments: assist for management of L LE    Transfers Overall transfer level: Needs assistance Equipment used: Rolling Parmvir Boomer (2 wheels) Transfers: Sit to/from Stand Sit to Stand: Min assist           General transfer comment: assist for balance upon standing and cues for hand placement    Ambulation/Gait Ambulation/Gait assistance: Mod assist Gait Distance (Feet): 8 Feet Assistive device: Rolling Wadsworth Skolnick (2 wheels) Gait Pattern/deviations: Step-through pattern, Decreased stride length, Knee flexed in stance - left, Trunk flexed Gait velocity: decreased     General Gait Details: L knee instability during stance phase. Able to take steps fwd/bwd/lateral with RW and modA for balance and RW management.   Stairs             Wheelchair Mobility    Modified Rankin (Stroke Patients Only)       Balance Overall balance assessment: Needs assistance Sitting-balance support: Feet supported, Single extremity supported Sitting balance-Leahy Scale: Fair     Standing balance support: Bilateral upper extremity supported Standing balance-Leahy Scale: Poor                              Cognition Arousal/Alertness: Awake/alert Behavior During Therapy: Flat affect Overall Cognitive Status: Impaired/Different from baseline Area of Impairment: Orientation, Attention, Memory, Following commands, Safety/judgement, Awareness, Problem solving  Orientation Level: Disoriented to, Time, Situation Current  Attention Level: Focused Memory: Decreased short-term memory Following Commands: Follows one step commands with increased time, Follows multi-step commands inconsistently Safety/Judgement: Decreased awareness of deficits Awareness: Intellectual Problem Solving: Slow processing, Decreased initiation, Difficulty sequencing, Requires verbal cues, Requires tactile cues General Comments: wife reports cognition is diffierent than baseline  and is typically talkative and joking but has been slowly improving since initial stroke        Exercises      General Comments        Pertinent Vitals/Pain Pain Assessment Pain Assessment: No/denies pain    Home Living                          Prior Function            PT Goals (current goals can now be found in the care plan section) Acute Rehab PT Goals Patient Stated Goal: did not state PT Goal Formulation: Patient unable to participate in goal setting Time For Goal Achievement: 08/29/22 Potential to Achieve Goals: Fair Progress towards PT goals: Progressing toward goals    Frequency    7X/week      PT Plan Current plan remains appropriate    Co-evaluation              AM-PAC PT "6 Clicks" Mobility   Outcome Measure  Help needed turning from your back to your side while in a flat bed without using bedrails?: A Lot Help needed moving from lying on your back to sitting on the side of a flat bed without using bedrails?: A Lot Help needed moving to and from a bed to a chair (including a wheelchair)?: A Little Help needed standing up from a chair using your arms (e.g., wheelchair or bedside chair)?: A Little Help needed to walk in hospital room?: A Lot Help needed climbing 3-5 steps with a railing? : Total 6 Click Score: 13    End of Session Equipment Utilized During Treatment: Gait belt Activity Tolerance: Patient tolerated treatment well Patient left: in bed;with call bell/phone within reach;with  family/visitor present Nurse Communication: Mobility status PT Visit Diagnosis: Other symptoms and signs involving the nervous system (R29.898);Muscle weakness (generalized) (M62.81);Other abnormalities of gait and mobility (R26.89)     Time: 7741-2878 PT Time Calculation (min) (ACUTE ONLY): 27 min  Charges:  $Gait Training: 8-22 mins $Therapeutic Activity: 8-22 mins                     Yzabelle Calles A. Dan Humphreys PT, DPT [facility] - Acute Rehabilitation Services    Pope Brunty A Kerryn Tennant 08/16/2022, 3:12 PM

## 2022-08-16 NOTE — ED Notes (Signed)
Called bell within reach, stretcher locked low, rails up.

## 2022-08-16 NOTE — Plan of Care (Signed)
Problem: Education: Goal: Understanding of CV disease, CV risk reduction, and recovery process will improve Outcome: Progressing Goal: Individualized Educational Video(s) Outcome: Progressing   Problem: Activity: Goal: Ability to return to baseline activity level will improve Outcome: Progressing   Problem: Cardiovascular: Goal: Ability to achieve and maintain adequate cardiovascular perfusion will improve Outcome: Progressing Goal: Vascular access site(s) Level 0-1 will be maintained Outcome: Progressing   Problem: Health Behavior/Discharge Planning: Goal: Ability to safely manage health-related needs after discharge will improve Outcome: Progressing   Problem: Education: Goal: Knowledge of disease or condition will improve Outcome: Progressing Goal: Knowledge of secondary prevention will improve (MUST DOCUMENT ALL) Outcome: Progressing Goal: Knowledge of patient specific risk factors will improve (Mark N/A or DELETE if not current risk factor) Outcome: Progressing   Problem: Ischemic Stroke/TIA Tissue Perfusion: Goal: Complications of ischemic stroke/TIA will be minimized Outcome: Progressing   Problem: Coping: Goal: Will verbalize positive feelings about self Outcome: Progressing Goal: Will identify appropriate support needs Outcome: Progressing   Problem: Health Behavior/Discharge Planning: Goal: Ability to manage health-related needs will improve Outcome: Progressing Goal: Goals will be collaboratively established with patient/family Outcome: Progressing   Problem: Self-Care: Goal: Ability to participate in self-care as condition permits will improve Outcome: Progressing Goal: Verbalization of feelings and concerns over difficulty with self-care will improve Outcome: Progressing Goal: Ability to communicate needs accurately will improve Outcome: Progressing   Problem: Nutrition: Goal: Risk of aspiration will decrease Outcome: Progressing Goal: Dietary  intake will improve Outcome: Progressing   Problem: Education: Goal: Ability to describe self-care measures that may prevent or decrease complications (Diabetes Survival Skills Education) will improve Outcome: Progressing Goal: Individualized Educational Video(s) Outcome: Progressing   Problem: Coping: Goal: Ability to adjust to condition or change in health will improve Outcome: Progressing   Problem: Fluid Volume: Goal: Ability to maintain a balanced intake and output will improve Outcome: Progressing   Problem: Health Behavior/Discharge Planning: Goal: Ability to identify and utilize available resources and services will improve Outcome: Progressing Goal: Ability to manage health-related needs will improve Outcome: Progressing   Problem: Metabolic: Goal: Ability to maintain appropriate glucose levels will improve Outcome: Progressing   Problem: Nutritional: Goal: Maintenance of adequate nutrition will improve Outcome: Progressing Goal: Progress toward achieving an optimal weight will improve Outcome: Progressing   Problem: Skin Integrity: Goal: Risk for impaired skin integrity will decrease Outcome: Progressing   Problem: Tissue Perfusion: Goal: Adequacy of tissue perfusion will improve Outcome: Progressing   Problem: Education: Goal: Knowledge of General Education information will improve Description: Including pain rating scale, medication(s)/side effects and non-pharmacologic comfort measures Outcome: Progressing   Problem: Health Behavior/Discharge Planning: Goal: Ability to manage health-related needs will improve Outcome: Progressing   Problem: Clinical Measurements: Goal: Ability to maintain clinical measurements within normal limits will improve Outcome: Progressing Goal: Will remain free from infection Outcome: Progressing Goal: Diagnostic test results will improve Outcome: Progressing Goal: Respiratory complications will improve Outcome:  Progressing Goal: Cardiovascular complication will be avoided Outcome: Progressing   Problem: Activity: Goal: Risk for activity intolerance will decrease Outcome: Progressing   Problem: Nutrition: Goal: Adequate nutrition will be maintained Outcome: Progressing   Problem: Coping: Goal: Level of anxiety will decrease Outcome: Progressing   Problem: Elimination: Goal: Will not experience complications related to bowel motility Outcome: Progressing Goal: Will not experience complications related to urinary retention Outcome: Progressing   Problem: Pain Managment: Goal: General experience of comfort will improve Outcome: Progressing   Problem: Safety: Goal: Ability to remain free from injury   will improve Outcome: Progressing   Problem: Skin Integrity: Goal: Risk for impaired skin integrity will decrease Outcome: Progressing   

## 2022-08-16 NOTE — Progress Notes (Addendum)
  Inpatient Rehabilitation Admissions Coordinator   I spoke with patient's wife by phone,where she and daughter are at his bedside in the ER for rehab assessment. Patient previously at [facility] CIR/AIR rehab at [facility] 12/6 until 12/15 after CVA. Was discharged at CGA/min assist level. Returned to ER 12/27. I reviewed again goals and expectations of a possible CIR admit. They prefer CIR for rehab. Family can provide expected caregiver support that is recommended. I await medical workup completion over the next few days and then would follow up with his progress to determine if readmit to [facility] CIR needed.Wife states his Insurance changes 08/19/22 to [facility] policy number [id number]. I will notify admitting of that upcoming change.  Please call me with any questions.   Ottie Glazier, RN, MSN Rehab Admissions Coordinator [phone]

## 2022-08-16 NOTE — TOC Initial Note (Signed)
Transition of Care [facility]) - Initial/Assessment Note    Patient Details  Name: Mitchell Washington MRN: [id number] Date of Birth: [birth year]  Transition of Care [facility]) CM/SW Contact:    Allayne Butcher, RN Phone Number: 08/16/2022, 1:02 PM  Clinical Narrative:                 Patient recently discharged from inpatient rehab at [facility] after acute CVA.  Patient agin admitted for Acute CVA and recommendation for Acute inpatient rehab again.  Patient's wife is interested in patient returning to AIR at [facility] at discharge.  [facility] rehab admissions coordinator following.    Expected Discharge Plan: IP Rehab Facility Barriers to Discharge: Continued Medical Work up   Patient Goals and CMS Choice Patient states their goals for this hospitalization and ongoing recovery are:: wife interested in going back to rehab CMS Medicare.gov Compare Post Acute Care list provided to:: Patient Choice offered to / list presented to : Patient, Spouse      Expected Discharge Plan and Services       Living arrangements for the past 2 months: Single Family Home                                      Prior Living Arrangements/Services Living arrangements for the past 2 months: Single Family Home Lives with:: Spouse Patient language and need for interpreter reviewed:: Yes Do you feel safe going back to the place where you live?: Yes      Need for Family Participation in Patient Care: Yes (Comment) Care giver support system in place?: Yes (comment)   Criminal Activity/Legal Involvement Pertinent to Current Situation/Hospitalization: No - Comment as needed  Activities of Daily Living      Permission Sought/Granted Permission sought to share information with : Case Manager, Family Supports, Facility Medical sales representative    Share Information with NAME: Pattricia Boss [place]  Permission granted to share info w AGENCY: [facility]  Permission granted to share info w Relationship: spouse  Permission  granted to share info w Contact Information: [phone]  Emotional Assessment         Alcohol / Substance Use: Not Applicable Psych Involvement: No (comment)  Admission diagnosis:  Acute CVA (cerebrovascular accident) (HCC) [I63.9] AKI (acute kidney injury) (HCC) [N17.9] Patient Active Problem List   Diagnosis Date Noted   AKI (acute kidney injury) (HCC) 08/15/2022   Left-sided weakness 08/14/2022   Acute renal failure superimposed on stage 3b chronic kidney disease (HCC) 08/14/2022   Dementia without behavioral disturbance (HCC) 08/14/2022   History of multiple strokes 08/14/2022   Posterior circulation stroke (HCC) 07/24/2022   Basilar artery stenosis with infarction (HCC) 07/18/2022   TIA (transient ischemic attack) 07/16/2022   Acute focal neurological deficit 07/15/2022   B12 deficiency 09/06/2019   Loss of memory 03/24/2018   Primary hypertension 01/15/2018   Cerebrovascular disease 01/15/2018   CKD (chronic kidney disease) stage 3, GFR 30-59 ml/min (HCC) 01/15/2018   Pure hypercholesterolemia 01/15/2018   Type 2 diabetes mellitus with peripheral neuropathy (HCC) 01/15/2018   PCP:  Kandyce Rud, MD Pharmacy:   [facility] [address] [place] [facility] [address] [place] [address] Phone: [phone] Fax: [phone]  [facility] [address] [place] [address] Phone: [phone] Fax: [phone]     Social Determinants of Health (SDOH) Social History: SDOH Screenings  Food Insecurity: No Food Insecurity (07/16/2022)  Housing: Low Risk  (07/16/2022)  Transportation Needs: No Transportation Needs (07/16/2022)  Utilities: Not At Risk (07/16/2022)  Tobacco Use: Medium Risk (08/15/2022)   SDOH Interventions:     Readmission Risk Interventions     No data to display

## 2022-08-16 NOTE — ED Notes (Signed)
Pt's briefs wet with urine. Pt's briefs changed, linens changed, repositioned in bed, monitor plugged into wall as pt was attached to monitor but it lost power, and assisted with dinner tray.

## 2022-08-16 NOTE — Consult Note (Addendum)
NEURO HOSPITALIST CONSULT NOTE   Requesting physician: Dr. Posey Pronto  Reason for Consult: Left sided weakness  History obtained from:  Chart     HPI:                                                                                                                                          Mitchell Washington is a 71 y.o. male with a PMHx of CVA (most recent stroke on 11/28, s/p balloon angioplasty for posterior circulation occlusion - MRI confirmed multiple small bilateral cerebellar infarcts), DM, gout, HLD, HTN, dementia who presented to the ED on Wednesday night with left sided weakness that had started on 12/25. Per his wife, he had been walking and then abruptly seemed to stumble and limp, with difficulty supporting himself on his left leg. Wife thought he was tired because he had been walking a lot that day but then noticed that the following day he continued to have difficulty and also had poor oral intake.   He was evaluated by Teleneurology. Note has been reviewed: "Patient likely suffered a recurrent stroke. MRI of the brain repeat is currently pending. Patient is out of the time window for IV thrombolytic therapy as well as endovascular therapy. I would still consider repeating the CTA of the head and neck though to assess the status of both of the regions that had the angioplasty performed.  I was able to review per the electronic medical records the procedure that the patient underwent. It appears the patient underwent status post balloon angioplasty x 2 involving the mid basilar artery on the left vertebrobasilar junction. I would keep the patient on a low-dose aspirin and Brilinta for now."  Stroke Team progress note from last admission to [facility] has been reviewed: "71 y.o. male with history of stroke, hypertension, hyperlipidemia, memory loss, REM sleep behavior disorder, tremor and dementia presenting with  difficulty with ambulation and maintaining equilibrium which started  on 07/14/2022.  He did not immediately seek treatment for this, but noticed yesterday that symptoms were recurring when he attempted to walk.  Upon arrival to the ED, he was found to have left facial droop as well as dysmetria.  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    Past Medical History:  Diagnosis Date   CVA (cerebral infarction)    Diabetes mellitus without complication ([place])    Gout    Hyperlipidemia    Hypertension    Memory loss    Stroke [facility])    Wears dentures    partial upper  Past Surgical History:  Procedure Laterality Date   APPENDECTOMY     CATARACT EXTRACTION W/PHACO Left 10/25/2019   Procedure: CATARACT EXTRACTION PHACO AND INTRAOCULAR LENS PLACEMENT (IOC) LEFT DIABETIC INTRAVEITREAL KENALOG INJECTION 1.93  00:22.6;  Surgeon: Eulogio Bear, MD;  Location: [facility];  Service: Ophthalmology;  Laterality: Left;  Diabetic - oral meds   CATARACT EXTRACTION W/PHACO Right 11/15/2019   Procedure: CATARACT EXTRACTION PHACO AND INTRAOCULAR LENS PLACEMENT ([place]) RIGHT DIABETIC;  Surgeon: Eulogio Bear, MD;  Location: [facility];  Service: Ophthalmology;  Laterality: Right;  0.92 0 ;19.7   IR ANGIO INTRA EXTRACRAN SEL COM CAROTID INNOMINATE BILAT MOD SED  07/16/2022   IR ANGIO VERTEBRAL SEL SUBCLAVIAN INNOMINATE UNI R MOD SED  07/16/2022   IR ANGIO VERTEBRAL SEL VERTEBRAL UNI L MOD SED  07/16/2022   IR ANGIO VERTEBRAL SEL VERTEBRAL UNI L MOD SED  07/22/2022   IR CT HEAD LTD  07/18/2022   IR PTA INTRACRANIAL  07/18/2022   RADIOLOGY WITH ANESTHESIA N/A 07/18/2022   Procedure: Cerebral angioplasty with possible stenting;  Surgeon: Luanne Bras, MD;  Location: [place];  Service: Radiology;  Laterality: N/A;    Family History  Problem Relation Age of  Onset   Diabetes Mother             Social History:  reports that he has quit smoking. He has never used smokeless tobacco. He reports that he does not currently use alcohol. He reports that he does not use drugs.  No Known Allergies  MEDICATIONS:                                                                                                                     Prior to Admission:  Medications Prior to Admission  Medication Sig Dispense Refill Last Dose   clopidogrel (PLAVIX) 75 MG tablet Take 75 mg by mouth daily.   08/12/2022   acetaminophen (TYLENOL) 325 MG tablet Take 2 tablets (650 mg total) by mouth every 4 (four) hours as needed for mild pain (or temp > 37.5 C (99.5 F)).      amLODipine (NORVASC) 2.5 MG tablet Take 1 tablet (2.5 mg total) by mouth daily. 30 tablet 0 08/12/2022   aspirin EC 81 MG tablet Take 81 mg by mouth daily.   08/12/2022   atorvastatin (LIPITOR) 80 MG tablet Take 1 tablet (80 mg total) by mouth daily. 30 tablet 0 08/12/2022   cyanocobalamin (VITAMIN B12) 1000 MCG tablet Take 1 tablet (1,000 mcg total) by mouth daily. 30 tablet 0 08/12/2022   diclofenac Sodium (VOLTAREN) 1 % GEL Apply 2 g topically 4 (four) times daily. 2 g 0 prn   donepezil (ARICEPT) 10 MG tablet Take 10 mg by mouth at bedtime.   08/12/2022   glipiZIDE (GLUCOTROL XL) 2.5 MG 24 hr tablet Take 1 tablet (2.5 mg total) by mouth daily before breakfast. 30 tablet 0 08/12/2022   lisinopril (ZESTRIL) 40 MG tablet Take 1 tablet (40 mg total)  by mouth daily. 30 tablet 0 08/12/2022   memantine (NAMENDA) 10 MG tablet Take 10 mg by mouth 2 (two) times daily.   08/12/2022   ticagrelor (BRILINTA) 90 MG TABS tablet Take 1 tablet (90 mg total) by mouth 2 (two) times daily. 60 tablet 0 08/12/2022   Scheduled:  aspirin EC  81 mg Oral Daily   atorvastatin  80 mg Oral Daily   donepezil  10 mg Oral QHS   enoxaparin (LOVENOX) injection  30 mg Subcutaneous Q24H   insulin aspart  0-15 Units Subcutaneous TID WC    insulin aspart  0-5 Units Subcutaneous QHS   memantine  10 mg Oral BID   ticagrelor  90 mg Oral BID      ROS:                                                                                                                                       Unable to obtain due to minimal speech output.    Blood pressure (!) 140/70, pulse 62, temperature 98 F (36.7 C), temperature source Oral, resp. rate 18, height 5' 9" (1.753 m), weight 75.6 kg, SpO2 99 %.   General Examination:                                                                                                       Physical Exam  HEENT-  [place]/AT    Lungs- Respirations unlabored Extremities- No edema   Neurological Examination Mental Status: Awake and alert. [place] with minimal, hyopophonic speech output, but follows commands and performs simple naming. Oriented to the state, but not the city. Oriented to the month, but not the day or the year.  Cranial Nerves: II: Temporal visual fields intact bilaterally. Fixates normally on visual stimuli.   III,IV, VI: No ptosis. EOMI but with hesitancy tracking to the left. No nystagmus.  V: Decreased temp sensation to left face VII: Left facial droop.  VIII: Hearing intact to voice IX,X: Hypophonic speech XI: Head slightly preferentially rotated to the right XII: Midline tongue extension Motor: RUE 4-/5 proximally, 4+/5 distally. Bradykinetic with mildly increased tone.  RLE 4-/5 hip flexion, 4+/5 knee extension, requiring coaching LUE 2/5 deltoid, 3/5 biceps/triceps, 1-2/5 grip LLE 2/5 hip flexion, 4-/5 knee extension Sensory: Temp sensation subjectively intact and symmetric x 4.   Deep Tendon Reflexes: Asymmetric reflexes are 1-3+ upper and lower extremities.  Plantars: Equivocal bilaterally Cerebellar: FNF slow but without ataxia on the right. Unable to perform  on the left.  Gait: Deferred   Lab Results: Basic Metabolic Panel: Recent Labs  Lab 08/14/22 1622  NA 139  K 5.4*   CL 110  CO2 20*  GLUCOSE 131*  BUN 57*  CREATININE 2.75*  CALCIUM 8.9    CBC: Recent Labs  Lab 08/14/22 1504 08/16/22 0539  WBC 6.2 6.2  HGB 12.3* 11.8*  HCT 38.8* 37.2*  MCV 84.7 83.4  PLT 297 261    Cardiac Enzymes: No results for input(s): "CKTOTAL", "CKMB", "CKMBINDEX", "TROPONINI" in the last 168 hours.  Lipid Panel: Recent Labs  Lab 08/15/22 0417  CHOL 71  TRIG 72  HDL 29*  CHOLHDL 2.4  VLDL 14  LDLCALC 28    Imaging: MR BRAIN WO CONTRAST  Result Date: 08/14/2022 CLINICAL DATA:  Initial evaluation for neuro deficit, stroke suspected. Recent hospitalization for stroke, status post posterior circulation angioplasty. EXAM: MRI HEAD WITHOUT CONTRAST TECHNIQUE: Multiplanar, multiecho pulse sequences of the brain and surrounding structures were obtained without intravenous contrast. COMPARISON:  Prior CT from earlier the same day as well as recent MRI from 12/17/2021. FINDINGS: Brain: Age-related cerebral atrophy. Patchy and confluent T2/FLAIR hyperintensity involving the periventricular deep white matter both cerebral hemispheres, consistent with chronic small vessel ischemic disease. Patchy involvement of the pons. Remote hemorrhagic lacunar infarct noted at the left basal ganglia. Remote bilateral PCA territory infarcts, left larger than right noted. Few scattered remote bilateral cerebellar infarcts. Previously seen small scattered cerebellar infarcts have largely resolved, with only faint residual punctate diffusion signal abnormality at the left cerebellum (series 5, image 9). There has been interval expansion of the previously a seen right pontine infarct, slightly increased in size as compared to previous exam, with further posterior extension towards the floor of the fourth ventricle (series 5, image 15). Degree of new/interval infarction best appreciated on ADC map (series 6, image 15). No associated hemorrhage or mass effect. No other evidence for new or interval  infarct elsewhere. No acute intracranial hemorrhage. No mass lesion, midline shift or mass effect. Mild ex vacuo dilatation of the left lateral ventricle related to the chronic left basal ganglia infarct. No hydrocephalus. No extra-axial fluid collection. Pituitary gland and suprasellar region within normal limits. 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 Major intracranial vascular flow voids are otherwise maintained. Skull and upper cervical spine: Craniocervical junction within normal limits. Bone marrow signal intensity heterogeneous but overall within normal limits. No scalp soft tissue abnormality. Sinuses/Orbits: Prior bilateral ocular lens replacement. Scattered mucosal thickening noted about the sphenoethmoidal and maxillary sinuses. Small right maxillary sinus retention cyst. No mastoid effusion. Other: None. IMPRESSION: 1. Slight interval expansion of the previously seen right pontine infarct, slightly increased in size as compared to previous, with further posterior extension towards the floor of the fourth ventricle. No associated hemorrhage or mass effect. 2. Previously seen scattered small cerebellar infarcts have largely resolved, with only faint residual diffusion signal abnormality at the left cerebellum. 3. No other new acute intracranial abnormality. 4. Underlying atrophy with chronic microvascular ischemic disease, with additional remote infarcts involving the left basal ganglia, bilateral PCA territories, and bilateral cerebellar hemispheres. Electronically Signed   By: Jeannine Boga M.D.   On: 08/14/2022 22:47   DG Chest Portable 1 View  Result Date: 08/14/2022 CLINICAL DATA:  Weakness EXAM: PORTABLE CHEST 1 VIEW COMPARISON:  07/15/2022 FINDINGS: Heart and mediastinal contours are within normal limits. No focal opacities or  effusions. No acute bony abnormality.  Aortic atherosclerosis. IMPRESSION: No active cardiopulmonary disease. Electronically Signed   By: Rolm Baptise M.D.   On: 08/14/2022 20:03   CT HEAD WO CONTRAST (5MM)  Result Date: 08/14/2022 CLINICAL DATA:  TIA EXAM: CT HEAD WITHOUT CONTRAST TECHNIQUE: Contiguous axial images were obtained from the base of the skull through the vertex without intravenous contrast. RADIATION DOSE REDUCTION: This exam was performed according to the departmental dose-optimization program which includes automated exposure control, adjustment of the mA and/or kV according to patient size and/or use of iterative reconstruction technique. COMPARISON:  None Available. FINDINGS: Brain: Old left occipital infarct. Old left basal ganglia lacunar infarct. There is atrophy and chronic small vessel disease changes. No acute intracranial abnormality. Specifically, no hemorrhage, hydrocephalus, mass lesion, acute infarction, or significant intracranial injury. Vascular: No hyperdense vessel or unexpected calcification. Skull: No acute calvarial abnormality. Sinuses/Orbits: No acute findings Other: None IMPRESSION: Old left infarct as above. Atrophy, chronic microvascular disease. No acute intracranial abnormality. Electronically Signed   By: Rolm Baptise M.D.   On: 08/14/2022 15:29    MRI brain with MRA head, 07/19/22:  1. Multiple new, small acute infarcts within both cerebellar hemispheres, right greater than left. 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 2. Unchanged bilateral P1 occlusions. No new intracranial occlusion.  TTE 11/28: 1. Left ventricular ejection fraction, by estimation, is 60 to 65%. The  left ventricle has normal function. The left ventricle has no regional  wall motion abnormalities. Left ventricular diastolic parameters are  indeterminate.   2. Right ventricular systolic function is normal. The right ventricular  size is normal. Tricuspid regurgitation signal is inadequate for assessing   PA pressure.   3. Left atrial size was mildly dilated.   4. The mitral valve is normal in structure. Trivial mitral valve  regurgitation. No evidence of mitral stenosis.   5. The aortic valve is tricuspid. There is mild thickening of the aortic  valve. Aortic valve regurgitation is not visualized. Aortic valve  sclerosis is present, with no evidence of aortic valve stenosis.   6. The inferior vena cava is normal in size with greater than 50%  respiratory variability, suggesting right atrial pressure of 3 mmHg.   CTA head and neck, 11/27: 1. Bilateral proximal PCA occlusion. 2. 24 mL of reported penumbra in the occipital lobes which largely corresponds to chronic infarcts. 3. 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 4. Mild cervical carotid artery atherosclerosis without significant stenosis. 5. Moderate left and mild right vertebral artery origin stenoses. 6. Aortic Atherosclerosis (ICD10-I70.0).    Assessment: 71 y.o. male with a PMHx of CVA (most recent stroke on 11/28, diagnosed as secondary to severe posterior circulation stenosis, s/p balloon angioplasty for posterior circulation occlusion - MRI confirmed multiple small bilateral cerebellar infarcts), DM, gout, HLD, HTN, dementia who presented to the ED on Wednesday night with left sided weakness that had started on 12/25. Per his wife, he had been walking and then abruptly seemed to stumble and limp, with difficulty supporting himself on his left leg. Wife thought he was tired because he had been walking a lot that day but then noticed that the following day he continued to have difficulty and also had poor oral intake.  - Exam reveals multiple deficits with separate localizations attributable to the old left BG infarct and new right pontine ischemic infarction. Deficits are worse on the left.  - MRI brain (12/27):  Slight interval  expansion of the previously seen acute right pontine infarct, slightly increased in size as compared to previous, with further posterior extension towards the floor of the fourth ventricle. No associated hemorrhage or mass effect. Previously seen scattered small cerebellar infarcts have largely resolved, with only faint residual diffusion signal abnormality at the left cerebellum. No other new acute intracranial abnormality. Underlying atrophy with chronic microvascular ischemic disease, with additional remote infarcts involving the left basal ganglia, bilateral PCA territories (left worse than right), and bilateral cerebellar hemispheres. - Labs: - BUN 41 and Cr 1.61 with eGFR 46.  - AST 83, ALT 189 - WBC 6.2 - DDx for etiology of neurological worsening: Agree with Hospitalist service that given his presentation with borderline hypotension, the stroke extension seen on MRI +/- overlapping exacerbation of pre-existing deficits from previous CVA secondary to hypotension are the most likely explanations.     Recommendations: - Continue ASA 81 mg po qd and Brilinta 90 mg po BID - Continue atorvastatin 80 mg po qd - Long-term BP goal 130-150 given BA stenosis  - Has had recent TTE. No need to repeat.  - Would consider repeating the CTA of the head and neck though to assess the status of both of the regions that had the angioplasty performed.  - PT/OT/Speech - HgbA1c, fasting lipid panel - Cardiac telemetry - Frequent neuro checks - NPO until passes stroke swallow screen   Electronically signed: Dr. Kerney Elbe 08/16/2022, 8:09 AM

## 2022-08-16 NOTE — Progress Notes (Signed)
Speech Language Pathology Treatment: Cognitive-Linquistic  Patient Details Name: Mitchell Washington MRN: [id number] DOB: 1951/01/14 Today's Date: 08/16/2022 Time: 1440-1455 SLP Time Calculation (min) (ACUTE ONLY): 15 min  Assessment / Plan / Recommendation Clinical Impression  Pt seen for ongoing speech intelligibility. Pt was very talkative during today's session. He primarily spoke at the simple sentence level and while his speech continues to appear atypical, he is able to communicate basic wants and needs functionally (let's go, get me out of here, help me up, let's go anywhere). In speaking with pt's nurses, they report that pt is able to make his wants/needs met with good functionally. In reading pt's discharge summary from [facility], it appears that pt is no longer experiencing an acute exacerbation of his speech impairment. At this time, recommend further assessment at next venue of care for preparation of potential discharge home.    HPI HPI: Mitchell Washington is a 71 y.o. male presenting with dysarthria and left sided weakness.  MRI revealed acute pontine infarct as well as basilar artery insufficiency; multiple new, small acute infarcts within both cerebellar hemispheres, right greater than left. Basilar artery angioplasty completed on 11/30.  PMH of stroke, hypertension, hyperlipidemia, memory loss, REM sleep behavior disorder, tremor and dementia. His wife reports baseline stuttering.      SLP Plan  Discharge SLP treatment due to (comment) (functional speech-language abilities to communicate basic simple wants/needs)      Recommendations for follow up therapy are one component of a multi-disciplinary discharge planning process, led by the attending physician.  Recommendations may be updated based on patient status, additional functional criteria and insurance authorization.    Recommendations                   Oral Care Recommendations: Oral care BID Follow Up Recommendations:  Acute inpatient rehab (3hours/day) Assistance recommended at discharge: Frequent or constant Supervision/Assistance SLP Visit Diagnosis: Cognitive communication deficit (W09.811) Plan: Discharge SLP treatment due to (comment) (functional speech-language abilities to communicate basic simple wants/needs)         Sankalp Ferrell B. Rutherford Nail, M.S., CCC-SLP, Mining engineer Certified Brain Injury [place]  [place] Office [phone] Ascom [phone] Fax [phone]

## 2022-08-17 ENCOUNTER — Inpatient Hospital Stay: Payer: Medicare HMO

## 2022-08-17 DIAGNOSIS — N179 Acute kidney failure, unspecified: Secondary | ICD-10-CM | POA: Diagnosis not present

## 2022-08-17 DIAGNOSIS — E7800 Pure hypercholesterolemia, unspecified: Secondary | ICD-10-CM | POA: Diagnosis not present

## 2022-08-17 DIAGNOSIS — E1142 Type 2 diabetes mellitus with diabetic polyneuropathy: Secondary | ICD-10-CM | POA: Diagnosis not present

## 2022-08-17 DIAGNOSIS — R531 Weakness: Secondary | ICD-10-CM | POA: Diagnosis not present

## 2022-08-17 LAB — CREATININE, SERUM
Creatinine, Ser: 1.35 mg/dL — ABNORMAL HIGH (ref 0.61–1.24)
GFR, Estimated: 56 mL/min — ABNORMAL LOW (ref >60)

## 2022-08-17 LAB — GLUCOSE, CAPILLARY
Glucose-Capillary: 105 mg/dL — ABNORMAL HIGH (ref 70–99)
Glucose-Capillary: 111 mg/dL — ABNORMAL HIGH (ref 70–99)
Glucose-Capillary: 118 mg/dL — ABNORMAL HIGH (ref 70–99)
Glucose-Capillary: 88 mg/dL (ref 70–99)

## 2022-08-17 MED ORDER — AMLODIPINE BESYLATE 5 MG PO TABS
2.5000 mg | ORAL_TABLET | Freq: Every day | ORAL | Status: DC
  Administered 2022-08-17 – 2022-08-22 (×6): 2.5 mg via ORAL
  Filled 2022-08-17 (×4): qty 1
  Filled 2022-08-17: qty 0.5
  Filled 2022-08-17 (×2): qty 1

## 2022-08-17 MED ORDER — ORAL CARE MOUTH RINSE: 15.0000 mL | OROMUCOSAL | Status: DC | PRN

## 2022-08-17 MED ORDER — IOHEXOL 350 MG/ML SOLN
75.0000 mL | Freq: Once | INTRAVENOUS | Status: AC | PRN
  Administered 2022-08-17: 75 mL via INTRAVENOUS

## 2022-08-17 MED ORDER — VITAMIN B-12 1000 MCG PO TABS
1000.0000 ug | ORAL_TABLET | Freq: Every day | ORAL | Status: DC
  Administered 2022-08-17 – 2022-08-22 (×6): 1000 ug via ORAL
  Filled 2022-08-17 (×7): qty 1

## 2022-08-17 NOTE — Progress Notes (Signed)
Physical Therapy Treatment Patient Details Name: Mitchell Washington MRN: [id number] DOB: [birth year] Today's Date: 08/17/2022   History of Present Illness 71 y/o male presented to ED on 08/14/22 for L sided weakness and difficulty walking. MRI showed slight expansion of previous R pontine infarct. Recent admission to [facility] 12/7-12/14 for R CVA. PMH: CVA s/p balloon angioplasty for posterior circulation occlusion on 07/16/22, T2DM, Gout, HLD , HTN ,CKD, dementia    PT Comments    Pt seen for PT tx with pt agreeable. Pt requires up to mod assist for bed mobility with hospital bed features, mod<>max assist for transfers, but was able to progress to walking with rail in hallway with mod assist & chair follow for safety. Pt demonstrates no active movement in even L shoulder shrug but is able to assist in advancing LLE during stepping/gait. At this time, do not anticipate pt could use RW 2/2 no active LUE movement nor ability to maintain hold on AD. Pt is making good progress with functional mobility & remains a great CIR candidate. Will continue to follow pt acutely to address balance, L NMR, and transfers & gait.  Of note, pt with aphasia & PT with difficulty understanding pt ~50% of the time during session. Will notify SLP assigned to pt tomorrow.    Recommendations for follow up therapy are one component of a multi-disciplinary discharge planning process, led by the attending physician.  Recommendations may be updated based on patient status, additional functional criteria and insurance authorization.  Follow Up Recommendations  Acute inpatient rehab (3hours/day)     Assistance Recommended at Discharge Frequent or constant Supervision/Assistance  Patient can return home with the following A little help with bathing/dressing/bathroom;Assist for transportation;Help with stairs or ramp for entrance;Assistance with cooking/housework;Direct supervision/assist for medications management;Direct  supervision/assist for financial management;A lot of help with walking and/or transfers   Equipment Recommendations  None recommended by PT    Recommendations for Other Services Rehab consult     Precautions / Restrictions Precautions Precautions: Fall Precaution Comments: L hemi, dementia Restrictions Weight Bearing Restrictions: No     Mobility  Bed Mobility Overal bed mobility: Needs Assistance Bed Mobility: Sidelying to Sit   Sidelying to sit: HOB elevated, Mod assist       General bed mobility comments: cuing to move BLE off EOB, assistance to upright trunk with pt putting forth good effort throughout    Transfers Overall transfer level: Needs assistance Equipment used: 1 person hand held assist Transfers: Sit to/from Stand, Bed to chair/wheelchair/BSC Sit to Stand: Mod assist, Max assist   Step pivot transfers: Mod assist       General transfer comment: Pt requires cuing to scoot out to edge of seat & for hand placement. Pt requires mod assist to power up from EOB, max assist to power up from low recliner. Pt is able to complete step pivot bed>recliner with mod assist.    Ambulation/Gait Ambulation/Gait assistance: Mod assist, +2 safety/equipment (+2 for chair follow) Gait Distance (Feet): 15 Feet Assistive device:  (rail in hallway) Gait Pattern/deviations: Decreased step length - right, Decreased step length - left, Decreased stride length, Decreased dorsiflexion - left, Decreased dorsiflexion - right, Decreased weight shift to left, Decreased stance time - left Gait velocity: decreased     General Gait Details: Pt requires cuing for stepping pattern, assistance 50% of the time to advance LLE forwards, pt with narrow step width RLE, decreased step length BLE, decreased heel strike BLE, decreased foot clearance BLE. PT provides  tactile cuing/blocking at L knee 2/2 A/P knee instability but no overt buckling noted.   Stairs             Wheelchair  Mobility    Modified Rankin (Stroke Patients Only)       Balance Overall balance assessment: Needs assistance Sitting-balance support: Feet supported Sitting balance-Leahy Scale: Fair Sitting balance - Comments: supervision static sitting EOB   Standing balance support: No upper extremity supported, During functional activity Standing balance-Leahy Scale: Poor                              Cognition Arousal/Alertness: Awake/alert Behavior During Therapy: Flat affect Overall Cognitive Status: Impaired/Different from baseline Area of Impairment: Orientation, Attention, Memory, Following commands, Safety/judgement, Awareness, Problem solving                 Orientation Level: Disoriented to, Time, Situation   Memory: Decreased short-term memory Following Commands: Follows one step commands with increased time Safety/Judgement: Decreased awareness of deficits Awareness: Intellectual Problem Solving: Slow processing, Decreased initiation, Difficulty sequencing, Requires verbal cues, Requires tactile cues General Comments: Pt appears to demonstrate L inattention to body        Exercises      General Comments General comments (skin integrity, edema, etc.): Pt required total assist for peri hygiene for thoroughness & PT provided total assist for changing into clean gown. HR up to 118 bpm after activity but drops to 44 bpm (nurse notified but already aware). SPO2 >90% on room air despite pt c/o SOB at times. Educated pt on pursed lip breathing.      Pertinent Vitals/Pain Pain Assessment Pain Assessment: Faces Faces Pain Scale: Hurts little more Pain Location: pt states "I hurt in my body" during sidelying>sitting but then reports he feels okay once sitting up, c/o neck pain 2/2 need to shave Pain Intervention(s): Monitored during session    Home Living                          Prior Function            PT Goals (current goals can now be found  in the care plan section) Acute Rehab PT Goals Patient Stated Goal: did not state PT Goal Formulation: Patient unable to participate in goal setting Time For Goal Achievement: 08/29/22 Potential to Achieve Goals: Fair Progress towards PT goals: Progressing toward goals    Frequency    7X/week      PT Plan Current plan remains appropriate    Co-evaluation              AM-PAC PT "6 Clicks" Mobility   Outcome Measure  Help needed turning from your back to your side while in a flat bed without using bedrails?: A Little Help needed moving from lying on your back to sitting on the side of a flat bed without using bedrails?: A Lot Help needed moving to and from a bed to a chair (including a wheelchair)?: A Lot Help needed standing up from a chair using your arms (e.g., wheelchair or bedside chair)?: A Lot Help needed to walk in hospital room?: Total Help needed climbing 3-5 steps with a railing? : Total 6 Click Score: 11    End of Session Equipment Utilized During Treatment: Gait belt Activity Tolerance: Patient tolerated treatment well;Patient limited by fatigue Patient left: in chair;with chair alarm set;with call bell/phone within reach Nurse  Communication: Mobility status PT Visit Diagnosis: Other symptoms and signs involving the nervous system (R29.898);Muscle weakness (generalized) (M62.81);Other abnormalities of gait and mobility (R26.89);Unsteadiness on feet (R26.81);Difficulty in walking, not elsewhere classified (R26.2)     Time: 7322-0254 PT Time Calculation (min) (ACUTE ONLY): 23 min  Charges:  $Therapeutic Activity: 8-22 mins $Neuromuscular Re-education: 8-22 mins                     Aleda Grana, PT, DPT 08/17/22, 2:04 PM  Sandi Mariscal 08/17/2022, 2:02 PM

## 2022-08-17 NOTE — Progress Notes (Signed)
[facility]  - [employer] [facility]   PATIENT NAME: Mitchell Washington    MR#:  [id number]  DATE OF BIRTH:  11/09/1950  SUBJECTIVE:   No family at bedside. Came in with increasing left sided weakness. Recently had CVa and was in CIR   VITALS:  Blood pressure (!) 148/57, pulse 67, temperature 98 F (36.7 C), resp. rate 16, height 5\' 9"  (1.753 m), weight 75.6 kg, SpO2 99 %.  PHYSICAL EXAMINATION:   GENERAL:  71 y.o.-year-old patient lying in the bed with no acute distress.  LUNGS: Normal breath sounds bilaterally, no wheezing CARDIOVASCULAR: S1, S2 normal. No murmur   ABDOMEN: Soft, nontender, nondistended. Bowel sounds present.  EXTREMITIES: No  edema b/l.    NEUROLOGIC:left side hemiparesis   LABORATORY PANEL:  CBC Recent Labs  Lab 08/16/22 0539  WBC 6.2  HGB 11.8*  HCT 37.2*  PLT 261     Chemistries  Recent Labs  Lab 08/14/22 1622 08/16/22 0539 08/17/22 0856  NA 139 142  --   K 5.4* 4.9  --   CL 110 112*  --   CO2 20* 21*  --   GLUCOSE 131* 107*  --   BUN 57* 41*  --   CREATININE 2.75* 1.61* 1.35*  CALCIUM 8.9 9.1  --   AST 83*  --   --   ALT 189*  --   --   ALKPHOS 77  --   --   BILITOT 0.5  --   --     Cardiac Enzymes No results for input(s): "TROPONINI" in the last 168 hours. RADIOLOGY:  CT ANGIO HEAD W OR WO CONTRAST  Result Date: 08/17/2022 CLINICAL DATA:  71 year old male status post bilateral cerebellar infarcts and brainstem about a month ago. Bilateral PCA occlusions. Status post endovascular treatment of high-grade Basilar artery and vertebrobasilar stenoses. EXAM: CT ANGIOGRAPHY HEAD TECHNIQUE: Multidetector CT imaging of the head was performed using the standard protocol during bolus administration of intravenous contrast. Multiplanar CT image reconstructions and MIPs were obtained to evaluate the vascular anatomy. RADIATION DOSE REDUCTION: This exam was performed according to the departmental dose-optimization program which  includes automated exposure control, adjustment of the mA and/or kV according to patient size and/or use of iterative reconstruction technique. CONTRAST:  80mL OMNIPAQUE IOHEXOL 350 MG/ML SOLN COMPARISON:  Head CT 08/14/2022. Brain MRI yesterday.  Post treatment intracranial MRA 07/19/2022. FINDINGS: CT HEAD Brain: Stable non contrast CT appearance of the brain. No acute intracranial hemorrhage identified. No midline shift, mass effect, or evidence of intracranial mass lesion. Chronic cerebellar, brainstem, left greater than right PCA, and left MCA territory infarcts. Calvarium and skull base: No acute osseous abnormality identified. Paranasal sinuses: Stable paranasal sinuses. Tympanic cavities and mastoids remain clear. Orbits: No acute orbit or scalp soft tissue finding. CTA HEAD Posterior circulation: Dominant left vertebral V4 segment. Severe distal vertebral artery atherosclerosis and tandem stenoses. Functionally occluded distal right vertebral at the vertebrobasilar junction, unchanged. Enhancement of the distal left vertebral artery is mildly improved compared to 07/15/2022. Left PICA origin remains patent. Severe proximal basilar artery stenosis persists (series 11 image 132 and series 13, image 22 today. This appears slightly improved since prior CTA. Basilar tip and SCA origins remain patent. Highly irregular PCA origins are patent, with improved bilateral PCA enhancement especially on the right. Left PCA P3 branches remain attenuated. Posterior communicating arteries are diminutive or absent. Anterior circulation: Both ICA siphons are patent. On the right moderate calcified plaque, and moderate  to severe short segment supraclinoid stenosis (series 16, image 97) appears stable. Right ICA terminus remains patent. On the left similar bulky supraclinoid plaque and at least moderate supraclinoid stenosis (series 16, image 115) appears stable. Left ICA terminus remains patent. MCA and ACA origins remain  patent. Severe left A1 stenosis appears stable. Anterior communicating artery and bilateral ACA branches remain within normal limits. Left MCA M1 segment is patent with mild irregularity at the bifurcation. Left MCA branches are stable and within normal limits. Right MCA M1 segment bifurcates early without stenosis. Right MCA branches are stable with mild posterior M2 segment irregularity. Venous sinuses: Early contrast timing, superior sagittal sinus is patent. Anatomic variants: Dominant left vertebral artery. Review of the MIP images confirms the above findings IMPRESSION: 1. 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 Mildly improved appearance of the pronounced distal Left Vertebral Artery and mid Basilar artery stenoses since 07/15/2022, but hemodynamically significant narrowing remains. Mildly improved appearance also bilateral PCA enhancement, distal left PCA branches remain occluded. 2. Superimposed significant bilateral supraclinoid ICA atherosclerotic stenoses also re-demonstrated and stable. Anterior circulation remains patent. Stable moderate to severe Left A1 stenosis. 3. Stable CT appearance of the brain, no new intracranial abnormality. Electronically Signed   By: Odessa Fleming M.D.   On: 08/17/2022 12:40    Assessment and Plan 71 year old male with past medical history of diabetes mellitus type 2, CKD 3B, hypertension, dementia and previous CVA most recently 1 month prior with multiple bilateral cerebellar infarcts status post balloon angioplasty for posterior circulation occlusion who presented to the emergency room on 12/27 with left-sided weakness.   Left-sided weakness suspected due to extension of recent CVA (1 month ago) --Initially thought to be new CVA, but MRI confirms only extension of old CVA.   --Given that patient initially presented with borderline hypotension, this is likely exacerbation of symptoms brought on by previous CVA from hypotension.  -- Neurology  following.  Will carefully limit home medications.   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 Mildly improved appearance of the pronounced distal Left Vertebral Artery and mid Basilar artery stenoses since 07/15/2022, but hemodynamically significant narrowing remains.Mildly improved appearance also bilateral PCA enhancement, distal left PCA branches remain occluded.   2. Superimposed significant bilateral supraclinoid ICA atherosclerotic stenoses also re-demonstrated and stable. Anterior circulation remains patent. Stable moderate to severe Left A1 stenosis.   --Patient seen by PT and OT who are recommending an patient rehab.  To be evaluated.   History of multiple strokes History of bilateral cerebellar infarcts 07/16/2022 s/p balloon angioplasty for posterior circulation occlusion --Continue aspirin and Brilinta   Acute renal failure superimposed on stage 3b chronic kidney disease (HCC) Creatinine 2.75, up from 1.38, Due to hypotension.   --creat 2.75--IVF--1.6--1.3   Dementia without behavioral disturbance (HCC) Continue donepezil and memantine Delirium precautions   Primary hypertension Holding home medications due to hypotension   Type 2 diabetes mellitus with peripheral neuropathy (HCC) Sliding scale insulin coverage   D/c tele--NSR    Procedures:none Family communication :spoke with wife on the phone and updated Consults :Neuro CODE STATUS: DNR DVT Prophylaxis :lovenox Level of care: Telemetry Medical Status is: Inpatient Remains inpatient appropriate because: CVA.Marland Kitchen planning d/cto CIR if approved    TOTAL TIME TAKING CARE OF THIS PATIENT: 35 minutes.  >50% time spent on counselling and coordination of care  Note: This dictation was prepared with Dragon dictation along with  smaller phrase technology. Any transcriptional errors that  result from this process are unintentional.  Enedina Finner M.D    [facility]   CC: Primary care physician; Kandyce Rud, MD

## 2022-08-17 NOTE — Progress Notes (Signed)
PT Cancellation Note  Patient Details Name: Mitchell Washington MRN: [id number] DOB: 03/20/1951   Cancelled Treatment:    Reason Eval/Treat Not Completed: Other (comment) PT orders received, chart reviewed. Pt noted to be out of his room at time of PT attempt. Will f/u as able.  Aleda Grana, PT, DPT 08/17/22, 11:34 AM   Sandi Mariscal 08/17/2022, 11:34 AM

## 2022-08-17 NOTE — Plan of Care (Signed)
CTA of head and neck: 1. 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 Mildly improved appearance of the pronounced distal Left Vertebral Artery and mid Basilar artery stenoses since 07/15/2022, but hemodynamically significant narrowing remains. Mildly improved appearance also bilateral PCA enhancement, distal left PCA branches remain occluded.  2. Superimposed significant bilateral supraclinoid ICA atherosclerotic stenoses also re-demonstrated and stable. Anterior circulation remains patent. Stable moderate to severe Left A1 stenosis.  3. Stable CT appearance of the brain, no new intracranial abnormality.  Assessment/Recommendations: - Based on the CTA findings above, there is no change to the management plan outlined in the initial Neurology consult note.  - Continue ASA 81 mg po qd and Brilinta 90 mg po BID - Continue atorvastatin 80 mg po qd - Long-term BP goal 130-150 given BA stenosis  - PT has assessed the patient. He is felt to be a good candidate for inpatient rehab.  - Neurohospitalist service will sign off. Please call if there are additional questions.   Electronically signed: Dr. Caryl Pina

## 2022-08-18 DIAGNOSIS — N179 Acute kidney failure, unspecified: Secondary | ICD-10-CM | POA: Diagnosis not present

## 2022-08-18 DIAGNOSIS — R531 Weakness: Secondary | ICD-10-CM | POA: Diagnosis not present

## 2022-08-18 DIAGNOSIS — E1142 Type 2 diabetes mellitus with diabetic polyneuropathy: Secondary | ICD-10-CM | POA: Diagnosis not present

## 2022-08-18 DIAGNOSIS — E7800 Pure hypercholesterolemia, unspecified: Secondary | ICD-10-CM | POA: Diagnosis not present

## 2022-08-18 LAB — GLUCOSE, CAPILLARY
Glucose-Capillary: 115 mg/dL — ABNORMAL HIGH (ref 70–99)
Glucose-Capillary: 197 mg/dL — ABNORMAL HIGH (ref 70–99)
Glucose-Capillary: 91 mg/dL (ref 70–99)
Glucose-Capillary: 114 mg/dL — ABNORMAL HIGH (ref 70–99)

## 2022-08-18 NOTE — Progress Notes (Signed)
Physical Therapy Treatment Patient Details Name: Mitchell Washington MRN: [id number] DOB: November 20, 1950 Today's Date: 08/18/2022   History of Present Illness Pt is a 71 y/o male presented to ED on 08/14/22 for L sided weakness and difficulty walking. MRI showed slight expansion of previous R pontine infarct. Recent admission to [facility] 12/7-12/14 for R CVA. PMH: CVA s/p balloon angioplasty for posterior circulation occlusion on 07/16/22, T2DM, Gout, HLD , HTN ,CKD, dementia    PT Comments    Pt was pleasant and motivated to participate during the session and put forth good effort throughout. Pt continued to require physical assistance with functional tasks as well as cuing for proper sequencing but did make some meaningful progress this session.  Pt was able to move his LUE somewhat at both the shoulder and elbow and once he was assisted to grasp the RW with his L hand he was able to maintain his grip on the walker during gait training.  Pt was able to amb around 8 feet at the EOB and then from the bed to chair with min A for stability and for proper sequencing for step-to pattern.  Pt will benefit from PT services in an IR setting upon discharge to safely address deficits listed in patient problem list for decreased caregiver assistance and eventual return to PLOF.     Recommendations for follow up therapy are one component of a multi-disciplinary discharge planning process, led by the attending physician.  Recommendations may be updated based on patient status, additional functional criteria and insurance authorization.  Follow Up Recommendations  Acute inpatient rehab (3hours/day)     Assistance Recommended at Discharge Frequent or constant Supervision/Assistance  Patient can return home with the following A little help with bathing/dressing/bathroom;Assist for transportation;Help with stairs or ramp for entrance;Assistance with cooking/housework;Direct supervision/assist for medications  management;Direct supervision/assist for financial management;A lot of help with walking and/or transfers   Equipment Recommendations  None recommended by PT    Recommendations for Other Services       Precautions / Restrictions Precautions Precautions: Fall Precaution Comments: L hemiparesis, dementia Restrictions Weight Bearing Restrictions: No     Mobility  Bed Mobility Overal bed mobility: Needs Assistance Bed Mobility: Supine to Sit     Supine to sit: Mod assist     General bed mobility comments: Mod A for BLE and trunk control and mod verbal and tactile cues for sequencing    Transfers Overall transfer level: Needs assistance Equipment used: Rolling walker (2 wheels) Transfers: Sit to/from Stand Sit to Stand: Mod assist           General transfer comment: Pt required min A to place L hand on the walker but was able to keep it there without assist; mod A to come to full upright standing and then min A to prevent LOB upon initial stand    Ambulation/Gait Ambulation/Gait assistance: Min assist Gait Distance (Feet): 8 Feet Assistive device: Rolling walker (2 wheels) Gait Pattern/deviations: Step-to pattern, Decreased step length - right, Decreased stance time - left Gait velocity: decreased     General Gait Details: Pt able to amb 8 feet with a RW with cues for step-to sequencing for improved stability   Stairs             Wheelchair Mobility    Modified Rankin (Stroke Patients Only)       Balance Overall balance assessment: Needs assistance Sitting-balance support: Feet supported Sitting balance-Leahy Scale: Good     Standing balance support: Bilateral  upper extremity supported, During functional activity, Reliant on assistive device for balance Standing balance-Leahy Scale: Poor                              Cognition Arousal/Alertness: Awake/alert Behavior During Therapy: Flat affect Overall Cognitive Status: No  family/caregiver present to determine baseline cognitive functioning                                          Exercises Total Joint Exercises Ankle Circles/Pumps: AROM, Strengthening, Both, 10 reps Quad Sets: Strengthening, Both, 10 reps Heel Slides: Strengthening, Both, 10 reps (AAROM on LLE) Hip ABduction/ADduction: Strengthening, Both, 10 reps (AAROM on LLE) Straight Leg Raises: Strengthening, Both, 10 reps (AAROM on LLE) Long Arc Quad: Strengthening, Both, 10 reps General Exercises - Upper Extremity Shoulder Flexion: Left, AAROM, 10 reps, Strengthening Elbow Flexion: Strengthening, AAROM, Left, 10 reps Elbow Extension: Strengthening, Left, 10 reps    General Comments        Pertinent Vitals/Pain Pain Assessment Pain Assessment: No/denies pain    Home Living                          Prior Function            PT Goals (current goals can now be found in the care plan section) Progress towards PT goals: Progressing toward goals    Frequency    7X/week      PT Plan Current plan remains appropriate    Co-evaluation              AM-PAC PT "6 Clicks" Mobility   Outcome Measure  Help needed turning from your back to your side while in a flat bed without using bedrails?: A Little Help needed moving from lying on your back to sitting on the side of a flat bed without using bedrails?: A Lot Help needed moving to and from a bed to a chair (including a wheelchair)?: A Lot Help needed standing up from a chair using your arms (e.g., wheelchair or bedside chair)?: A Lot Help needed to walk in hospital room?: A Lot Help needed climbing 3-5 steps with a railing? : Total 6 Click Score: 12    End of Session Equipment Utilized During Treatment: Gait belt Activity Tolerance: Patient tolerated treatment well Patient left: in chair;with chair alarm set;with call bell/phone within reach Nurse Communication: Mobility status PT Visit Diagnosis:  Other symptoms and signs involving the nervous system (R29.898);Muscle weakness (generalized) (M62.81);Other abnormalities of gait and mobility (R26.89);Unsteadiness on feet (R26.81);Difficulty in walking, not elsewhere classified (R26.2)     Time: 8182-9937 PT Time Calculation (min) (ACUTE ONLY): 24 min  Charges:  $Gait Training: 8-22 mins $Therapeutic Exercise: 8-22 mins                     D. Scott Sheppard Luckenbach PT, DPT 08/18/22, 5:30 PM

## 2022-08-18 NOTE — Progress Notes (Signed)
[facility]  - [place] at [facility]   PATIENT NAME: Mitchell Washington    MR#:  [id number]  DATE OF BIRTH:  1950/12/24  SUBJECTIVE:   No family at bedside. Came in with increasing left sided weakness. Recently had CVa and was in CIR Sitting out int he chair   VITALS:  Blood pressure 135/74, pulse 84, temperature 98 F (36.7 C), resp. rate 17, height 5\' 9"  (1.753 m), weight 75.6 kg, SpO2 96 %.  PHYSICAL EXAMINATION:   GENERAL:  71 y.o.-year-old patient lying in the bed with no acute distress.  LUNGS: Normal breath sounds bilaterally, no wheezing CARDIOVASCULAR: S1, S2 normal. No murmur   ABDOMEN: Soft, nontender, nondistended. Bowel sounds present.  EXTREMITIES: No  edema b/l.    NEUROLOGIC:left side hemiparesis   LABORATORY PANEL:  CBC Recent Labs  Lab 08/16/22 0539  WBC 6.2  HGB 11.8*  HCT 37.2*  PLT 261     Chemistries  Recent Labs  Lab 08/14/22 1622 08/16/22 0539 08/17/22 0856  NA 139 142  --   K 5.4* 4.9  --   CL 110 112*  --   CO2 20* 21*  --   GLUCOSE 131* 107*  --   BUN 57* 41*  --   CREATININE 2.75* 1.61* 1.35*  CALCIUM 8.9 9.1  --   AST 83*  --   --   ALT 189*  --   --   ALKPHOS 77  --   --   BILITOT 0.5  --   --     Cardiac Enzymes No results for input(s): "TROPONINI" in the last 168 hours. RADIOLOGY:  CT ANGIO HEAD W OR WO CONTRAST  Result Date: 08/17/2022 CLINICAL DATA:  71 year old male status post bilateral cerebellar infarcts and brainstem about a month ago. Bilateral PCA occlusions. Status post endovascular treatment of high-grade Basilar artery and vertebrobasilar stenoses. EXAM: CT ANGIOGRAPHY HEAD TECHNIQUE: Multidetector CT imaging of the head was performed using the standard protocol during bolus administration of intravenous contrast. Multiplanar CT image reconstructions and MIPs were obtained to evaluate the vascular anatomy. RADIATION DOSE REDUCTION: This exam was performed according to the departmental  dose-optimization program which includes automated exposure control, adjustment of the mA and/or kV according to patient size and/or use of iterative reconstruction technique. CONTRAST:  33mL OMNIPAQUE IOHEXOL 350 MG/ML SOLN COMPARISON:  Head CT 08/14/2022. Brain MRI yesterday.  Post treatment intracranial MRA 07/19/2022. FINDINGS: CT HEAD Brain: Stable non contrast CT appearance of the brain. No acute intracranial hemorrhage identified. No midline shift, mass effect, or evidence of intracranial mass lesion. Chronic cerebellar, brainstem, left greater than right PCA, and left MCA territory infarcts. Calvarium and skull base: No acute osseous abnormality identified. Paranasal sinuses: Stable paranasal sinuses. Tympanic cavities and mastoids remain clear. Orbits: No acute orbit or scalp soft tissue finding. CTA HEAD Posterior circulation: Dominant left vertebral V4 segment. Severe distal vertebral artery atherosclerosis and tandem stenoses. Functionally occluded distal right vertebral at the vertebrobasilar junction, unchanged. Enhancement of the distal left vertebral artery is mildly improved compared to 07/15/2022. Left PICA origin remains patent. Severe proximal basilar artery stenosis persists (series 11 image 132 and series 13, image 22 today. This appears slightly improved since prior CTA. Basilar tip and SCA origins remain patent. Highly irregular PCA origins are patent, with improved bilateral PCA enhancement especially on the right. Left PCA P3 branches remain attenuated. Posterior communicating arteries are diminutive or absent. Anterior circulation: Both ICA siphons are patent. On the right moderate  calcified plaque, and moderate to severe short segment supraclinoid stenosis (series 16, image 97) appears stable. Right ICA terminus remains patent. On the left similar bulky supraclinoid plaque and at least moderate supraclinoid stenosis (series 16, image 115) appears stable. Left ICA terminus remains patent.  MCA and ACA origins remain patent. Severe left A1 stenosis appears stable. Anterior communicating artery and bilateral ACA branches remain within normal limits. Left MCA M1 segment is patent with mild irregularity at the bifurcation. Left MCA branches are stable and within normal limits. Right MCA M1 segment bifurcates early without stenosis. Right MCA branches are stable with mild posterior M2 segment irregularity. Venous sinuses: Early contrast timing, superior sagittal sinus is patent. Anatomic variants: Dominant left vertebral artery. Review of the MIP images confirms the above findings IMPRESSION: 1. 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 Mildly improved appearance of the pronounced distal Left Vertebral Artery and mid Basilar artery stenoses since 07/15/2022, but hemodynamically significant narrowing remains. Mildly improved appearance also bilateral PCA enhancement, distal left PCA branches remain occluded. 2. Superimposed significant bilateral supraclinoid ICA atherosclerotic stenoses also re-demonstrated and stable. Anterior circulation remains patent. Stable moderate to severe Left A1 stenosis. 3. Stable CT appearance of the brain, no new intracranial abnormality. Electronically Signed   By: Odessa Fleming M.D.   On: 08/17/2022 12:40    Assessment and Plan 71 year old male with past medical history of diabetes mellitus type 2, CKD 3B, hypertension, dementia and previous CVA most recently 1 month prior with multiple bilateral cerebellar infarcts status post balloon angioplasty for posterior circulation occlusion who presented to the emergency room on 12/27 with left-sided weakness.   Left-sided weakness suspected due to extension of recent CVA (1 month ago) --Initially thought to be new CVA, but MRI confirms only extension of old CVA.   --Given that patient initially presented with borderline hypotension, this is likely exacerbation of symptoms brought on by previous CVA from  hypotension.  -- Neurology following.  Will carefully limit home medications.   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 Mildly improved appearance of the pronounced distal Left Vertebral Artery and mid Basilar artery stenoses since 07/15/2022, but hemodynamically significant narrowing remains.Mildly improved appearance also bilateral PCA enhancement, distal left PCA branches remain occluded. 2. Superimposed significant bilateral supraclinoid ICA atherosclerotic stenoses also re-demonstrated and stable. Anterior circulation remains patent. Stable moderate to severe Left A1 stenosis.   --Patient seen by PT and OT who are recommending acute patient rehab.  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   --creat 2.75--IVF--1.6--1.3   Dementia without behavioral disturbance (HCC) Continue donepezil and memantine Delirium precautions   Primary hypertension Holding home medications due to hypotension   Type 2 diabetes mellitus with peripheral neuropathy (HCC) Sliding scale insulin coverage       Procedures:none Family communication :snone todayupdated Consults :Neuro CODE STATUS: DNR DVT Prophylaxis :lovenox Level of care: Telemetry Medical Status is: Inpatient Remains inpatient appropriate because: CVA.Marland Kitchen planning d/cto CIR if approved and bed available. Pt is medically best at present for discharge    TOTAL TIME TAKING CARE OF THIS PATIENT: 35 minutes.  >50% time spent on counselling and coordination of care  Note: This dictation was  prepared with Dragon dictation  along with smaller phrase technology. Any transcriptional errors that result from this process are unintentional.  Enedina Finner M.D    [facility]   CC: Primary care physician; Kandyce Rud, MD

## 2022-08-18 NOTE — TOC Progression Note (Signed)
Transition of Care [facility]) - Progression Note    Patient Details  Name: Mitchell Washington MRN: [id number] Date of Birth: 02-Nov-1950  Transition of [facility]) CM/SW Contact  Liliana Cline, LCSW Phone Number: 08/18/2022, 8:45 AM  Clinical Narrative:    Per chart review, plan for CIR. TOC will continue to follow to assist with DC planning.    Expected Discharge Plan: IP Rehab Facility Barriers to Discharge: Continued Medical Work up  Expected Discharge Plan and Services       Living arrangements for the past 2 months: Single Family Home Expected Discharge Date: 08/17/22                                     Social Determinants of Health (SDOH) Interventions SDOH Screenings   Food Insecurity: No Food Insecurity (08/16/2022)  Housing: Low Risk  (08/16/2022)  Transportation Needs: No Transportation Needs (08/16/2022)  Utilities: Not At Risk (08/16/2022)  Tobacco Use: Medium Risk (08/15/2022)    Readmission Risk Interventions     No data to display

## 2022-08-18 NOTE — Progress Notes (Signed)
Occupational Therapy Treatment Patient Details Name: Mitchell Washington MRN: [id number] DOB: 09-12-1950 Today's Date: 08/18/2022   History of present illness 71 y/o male presented to ED on 08/14/22 for L sided weakness and difficulty walking. MRI showed slight expansion of previous R pontine infarct. Recent admission to [facility] 12/7-12/14 for R CVA. PMH: CVA s/p balloon angioplasty for posterior circulation occlusion on 07/16/22, T2DM, Gout, HLD , HTN ,CKD, dementia   OT comments  Chart reviewed, pt greeted in chair requesting to transfer to [facility] Pt is making slow progress towards goals, continues to present with functional deficits affecting safe and optimal ADL completion. MAX A required for peri care in STS, MOD A for STS and functional transfers. Pt continues to present with L sided inattention. Progress is being made towards goals set fourth, discharge recommendation remains appropriate.    Recommendations for follow up therapy are one component of a multi-disciplinary discharge planning process, led by the attending physician.  Recommendations may be updated based on patient status, additional functional criteria and insurance authorization.    Follow Up Recommendations  Acute inpatient rehab (3hours/day)     Assistance Recommended at Discharge Frequent or constant Supervision/Assistance  Patient can return home with the following  A little help with walking and/or transfers;A little help with bathing/dressing/bathroom;Assistance with cooking/housework;Direct supervision/assist for financial management;Assist for transportation;Help with stairs or ramp for entrance;Direct supervision/assist for medications management   Equipment Recommendations  Other (comment) (defer)    Recommendations for Other Services      Precautions / Restrictions Precautions Precautions: Fall Precaution Comments: L hemi, dementia Restrictions Weight Bearing Restrictions: No       Mobility Bed Mobility                General bed mobility comments: NT in recliner pre/post session    Transfers Overall transfer level: Needs assistance Equipment used: 1 person hand held assist Transfers: Sit to/from Stand, Bed to chair/wheelchair/BSC Sit to Stand: Mod assist           General transfer comment: step by step vcs     Balance Overall balance assessment: Needs assistance Sitting-balance support: Feet supported Sitting balance-Leahy Scale: Fair     Standing balance support: No upper extremity supported, During functional activity Standing balance-Leahy Scale: Poor                             ADL either performed or assessed with clinical judgement   ADL Overall ADL's : Needs assistance/impaired                                       General ADL Comments: MAX A for peri care following BM, MAX A for doffing/donning socks in seated, MOD-MAX A for UB dressing using hemi technique, MOD A for SPT bedside chair<>bsc    Extremity/Trunk Assessment              Vision       Perception Perception Perception: Impaired Inattention/Neglect: Does not attend to left side of body   Praxis Praxis Praxis: Impaired Praxis Impairment Details: Motor planning;Perseveration    Cognition Arousal/Alertness: Awake/alert Behavior During Therapy: Flat affect Overall Cognitive Status: Impaired/Different from baseline Area of Impairment: Orientation, Attention, Memory, Following commands, Safety/judgement, Awareness, Problem solving                 Orientation Level: Disoriented  to, Time, Situation Current Attention Level: Focused Memory: Decreased short-term memory Following Commands: Follows one step commands with increased time Safety/Judgement: Decreased awareness of deficits Awareness: Intellectual Problem Solving: Slow processing, Decreased initiation, Difficulty sequencing, Requires verbal cues, Requires tactile cues          Exercises       Shoulder Instructions       General Comments      Pertinent Vitals/ Pain       Pain Assessment Pain Assessment: No/denies pain  Home Living                                          Prior Functioning/Environment              Frequency  Min 3X/week        Progress Toward Goals  OT Goals(current goals can now be found in the care plan section)  Progress towards OT goals: Progressing toward goals     Plan Discharge plan remains appropriate;Frequency needs to be updated    Co-evaluation                 AM-PAC OT "6 Clicks" Daily Activity     Outcome Measure   Help from another person eating meals?: A Little Help from another person taking care of personal grooming?: A Lot Help from another person toileting, which includes using toliet, bedpan, or urinal?: A Lot Help from another person bathing (including washing, rinsing, drying)?: A Lot Help from another person to put on and taking off regular upper body clothing?: A Lot Help from another person to put on and taking off regular lower body clothing?: A Lot 6 Click Score: 13    End of Session    OT Visit Diagnosis: Unsteadiness on feet (R26.81);Other abnormalities of gait and mobility (R26.89);Muscle weakness (generalized) (M62.81);Hemiplegia and hemiparesis   Activity Tolerance Patient tolerated treatment well   Patient Left in chair;with call bell/phone within reach;with chair alarm set   Nurse Communication Mobility status        Time: [id number] OT Time Calculation (min): 27 min  Charges: OT General Charges $OT Visit: 1 Visit OT Treatments $Self Care/Home Management : 8-22 mins $Therapeutic Activity: 8-22 mins  Shanon Payor, OTD OTR/L  08/18/22, 1:19 PM

## 2022-08-19 DIAGNOSIS — R531 Weakness: Secondary | ICD-10-CM | POA: Diagnosis not present

## 2022-08-19 LAB — GLUCOSE, CAPILLARY
Glucose-Capillary: 125 mg/dL — ABNORMAL HIGH (ref 70–99)
Glucose-Capillary: 126 mg/dL — ABNORMAL HIGH (ref 70–99)
Glucose-Capillary: 149 mg/dL — ABNORMAL HIGH (ref 70–99)
Glucose-Capillary: 184 mg/dL — ABNORMAL HIGH (ref 70–99)
Glucose-Capillary: 181 mg/dL — ABNORMAL HIGH (ref 70–99)
Glucose-Capillary: 123 mg/dL — ABNORMAL HIGH (ref 70–99)

## 2022-08-19 MED ORDER — ENSURE ENLIVE PO LIQD
237.0000 mL | Freq: Two times a day (BID) | ORAL | Status: DC
  Administered 2022-08-20 – 2022-08-22 (×5): 237 mL via ORAL

## 2022-08-19 NOTE — TOC Progression Note (Signed)
Transition of Care [facility]) - Progression Note    Patient Details  Name: Mitchell Washington MRN: [id number] Date of Birth: 11-19-1950  Transition of Care [facility]) CM/SW Kinder, RN Phone Number: 08/19/2022, 12:30 PM  Clinical Narrative:   Awaiting to see if [facility] will be making a bed offer for this patient, in the meantime TOC continues to follow for additional needs    Expected Discharge Plan: [place] Barriers to Discharge: Continued Medical Work up  Expected Discharge Plan and [place] arrangements for the past 2 months: Single Family Home Expected Discharge Date: 08/17/22                                     Social Determinants of Health (SDOH) Interventions SDOH Screenings   Food Insecurity: No Food Insecurity (08/16/2022)  Housing: Low Risk  (08/16/2022)  Transportation Needs: No Transportation Needs (08/16/2022)  Utilities: Not At Risk (08/16/2022)  Tobacco Use: Medium Risk (08/15/2022)    Readmission Risk Interventions     No data to display

## 2022-08-19 NOTE — Progress Notes (Signed)
[facility]  - [place] at [facility]   PATIENT NAME: Mitchell Washington    MR#:  [id number]  DATE OF BIRTH:  1951-05-11  SUBJECTIVE:   No family at bedside. Came in with increasing left sided weakness. Recently had CVa and was in CIR No new complaints   VITALS:  Blood pressure (!) 144/52, pulse 74, temperature 97.6 F (36.4 C), temperature source Oral, resp. rate 18, height 5\' 9"  (1.753 m), weight 75.6 kg, SpO2 100 %.  PHYSICAL EXAMINATION:   GENERAL:  72 y.o.-year-old patient lying in the bed with no acute distress.  LUNGS: Normal breath sounds bilaterally, no wheezing CARDIOVASCULAR: S1, S2 normal. No murmur   ABDOMEN: Soft, nontender, nondistended. Bowel sounds present.  EXTREMITIES: No  edema b/l.    NEUROLOGIC:left side hemiparesis   LABORATORY PANEL:  CBC Recent Labs  Lab 08/16/22 0539  WBC 6.2  HGB 11.8*  HCT 37.2*  PLT 261     Chemistries  Recent Labs  Lab 08/14/22 1622 08/16/22 0539 08/17/22 0856  NA 139 142  --   K 5.4* 4.9  --   CL 110 112*  --   CO2 20* 21*  --   GLUCOSE 131* 107*  --   BUN 57* 41*  --   CREATININE 2.75* 1.61* 1.35*  CALCIUM 8.9 9.1  --   AST 83*  --   --   ALT 189*  --   --   ALKPHOS 77  --   --   BILITOT 0.5  --   --     Cardiac Enzymes No results for input(s): "TROPONINI" in the last 168 hours. RADIOLOGY:  CT ANGIO HEAD W OR WO CONTRAST  Result Date: 08/17/2022 CLINICAL DATA:  72 year old male status post bilateral cerebellar infarcts and brainstem about a month ago. Bilateral PCA occlusions. Status post endovascular treatment of high-grade Basilar artery and vertebrobasilar stenoses. EXAM: CT ANGIOGRAPHY HEAD TECHNIQUE: Multidetector CT imaging of the head was performed using the standard protocol during bolus administration of intravenous contrast. Multiplanar CT image reconstructions and MIPs were obtained to evaluate the vascular anatomy. RADIATION DOSE REDUCTION: This exam was performed according to the  departmental dose-optimization program which includes automated exposure control, adjustment of the mA and/or kV according to patient size and/or use of iterative reconstruction technique. CONTRAST:  55mL OMNIPAQUE IOHEXOL 350 MG/ML SOLN COMPARISON:  Head CT 08/14/2022. Brain MRI yesterday.  Post treatment intracranial MRA 07/19/2022. FINDINGS: CT HEAD Brain: Stable non contrast CT appearance of the brain. No acute intracranial hemorrhage identified. No midline shift, mass effect, or evidence of intracranial mass lesion. Chronic cerebellar, brainstem, left greater than right PCA, and left MCA territory infarcts. Calvarium and skull base: No acute osseous abnormality identified. Paranasal sinuses: Stable paranasal sinuses. Tympanic cavities and mastoids remain clear. Orbits: No acute orbit or scalp soft tissue finding. CTA HEAD Posterior circulation: Dominant left vertebral V4 segment. Severe distal vertebral artery atherosclerosis and tandem stenoses. Functionally occluded distal right vertebral at the vertebrobasilar junction, unchanged. Enhancement of the distal left vertebral artery is mildly improved compared to 07/15/2022. Left PICA origin remains patent. Severe proximal basilar artery stenosis persists (series 11 image 132 and series 13, image 22 today. This appears slightly improved since prior CTA. Basilar tip and SCA origins remain patent. Highly irregular PCA origins are patent, with improved bilateral PCA enhancement especially on the right. Left PCA P3 branches remain attenuated. Posterior communicating arteries are diminutive or absent. Anterior circulation: Both ICA siphons are patent. On the  right moderate calcified plaque, and moderate to severe short segment supraclinoid stenosis (series 16, image 97) appears stable. Right ICA terminus remains patent. On the left similar bulky supraclinoid plaque and at least moderate supraclinoid stenosis (series 16, image 115) appears stable. Left ICA terminus  remains patent. MCA and ACA origins remain patent. Severe left A1 stenosis appears stable. Anterior communicating artery and bilateral ACA branches remain within normal limits. Left MCA M1 segment is patent with mild irregularity at the bifurcation. Left MCA branches are stable and within normal limits. Right MCA M1 segment bifurcates early without stenosis. Right MCA branches are stable with mild posterior M2 segment irregularity. Venous sinuses: Early contrast timing, superior sagittal sinus is patent. Anatomic variants: Dominant left vertebral artery. Review of the MIP images confirms the above findings IMPRESSION: 1. 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 Mildly improved appearance of the pronounced distal Left Vertebral Artery and mid Basilar artery stenoses since 07/15/2022, but hemodynamically significant narrowing remains. Mildly improved appearance also bilateral PCA enhancement, distal left PCA branches remain occluded. 2. Superimposed significant bilateral supraclinoid ICA atherosclerotic stenoses also re-demonstrated and stable. Anterior circulation remains patent. Stable moderate to severe Left A1 stenosis. 3. Stable CT appearance of the brain, no new intracranial abnormality. Electronically Signed   By: Genevie Ann M.D.   On: 08/17/2022 12:40    Assessment and Plan 72 year old male with past medical history of diabetes mellitus type 2, CKD 3B, hypertension, dementia and previous CVA most recently 1 month prior with multiple bilateral cerebellar infarcts status post balloon angioplasty for posterior circulation occlusion who presented to the emergency room on 12/27 with left-sided weakness.   Left-sided weakness suspected due to extension of recent CVA (1 month ago) --Initially thought to be new CVA, but MRI confirms only extension of old CVA.   --Given that patient initially presented with borderline hypotension, this is likely exacerbation of symptoms brought on by  previous CVA from hypotension.  -- Neurology following.  Will carefully limit home medications.   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 Mildly improved appearance of the pronounced distal Left Vertebral Artery and mid Basilar artery stenoses since 07/15/2022, but hemodynamically significant narrowing remains.Mildly improved appearance also bilateral PCA enhancement, distal left PCA branches remain occluded. 2. Superimposed significant bilateral supraclinoid ICA atherosclerotic stenoses also re-demonstrated and stable. Anterior circulation remains patent. Stable moderate to severe Left A1 stenosis.   --Patient seen by PT and OT who are recommending acute patient rehab.  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   --creat 2.75--IVF--1.6--1.3   Dementia without behavioral disturbance (HCC) Continue donepezil and memantine Delirium precautions   Primary hypertension Holding home medications due to hypotension   Type 2 diabetes mellitus with peripheral neuropathy (HCC) Sliding scale insulin coverage       Procedures:none Family communication :none Consults :Neuro CODE STATUS: DNR DVT Prophylaxis :lovenox Level of care: Telemetry Medical Status is: Inpatient Remains inpatient appropriate because: CVA.Marland Kitchen planning d/cto CIR if approved and bed available. Pt is medically best at present for discharge    TOTAL TIME TAKING CARE OF THIS PATIENT: 25 minutes.  >50% time spent on counselling and coordination of care  Note: This dictation  was prepared with Dragon dictation  along with smaller phrase technology. Any transcriptional errors that result from this process are unintentional.  Fritzi Mandes M.D    [facility]   CC: Primary care physician; Derinda Late, MD

## 2022-08-19 NOTE — Progress Notes (Addendum)
Physical Therapy Treatment Patient Details Name: Mitchell Washington MRN: [id number] DOB: 1951/05/30 Today's Date: 08/19/2022   History of Present Illness Pt is a 72 y/o male presented to ED on 08/14/22 for L sided weakness and difficulty walking. MRI showed slight expansion of previous R pontine infarct. Recent admission to [facility] 12/7-12/14 for R CVA. PMH: CVA s/p balloon angioplasty for posterior circulation occlusion on 07/16/22, T2DM, Gout, HLD , HTN ,CKD, dementia    PT Comments    Pt presents supine in bed alert and oriented x 3 with family present and agreeable to PT. He continues to show limited muscular activation, strength, and ROM on left side on UE and LE. As result, he continues to demonstrate limited mobility especially with advancing the LLE and gripping walker with LUE. Pt did show progress with dynamic balance and transfers with ability to perform sit to stand with min A with use of BUE using bed railing from chair and min guard with lateral and anterior and posterior weight lifts. PT continued to stress importance of PROM exercises for LUE and LLE to avoid joint stiffening. He will continue to benefit from skilled PT to improve neuromuscular activation of left UE and LE to improve mobility to decrease caregiver burden and return home safely. PT continues to recommend inpatient rehab to address aforementioned deficits.     Recommendations for follow up therapy are one component of a multi-disciplinary discharge planning process, led by the attending physician.  Recommendations may be updated based on patient status, additional functional criteria and insurance authorization.  Follow Up Recommendations  Acute inpatient rehab (3hours/day)     Assistance Recommended at Discharge Frequent or constant Supervision/Assistance  Patient can return home with the following A lot of help with walking and/or transfers;A lot of help with bathing/dressing/bathroom   Equipment Recommendations    (Recommendation to be made by next facility)    Recommendations for Other Services       Precautions / Restrictions Precautions Precautions: Fall Precaution Comments: L hemiparesis, dementia Restrictions Weight Bearing Restrictions: No     Mobility  Bed Mobility Overal bed mobility: Needs Assistance Bed Mobility: Supine to Sit Rolling: Supervision Sidelying to sit: HOB elevated, Min assist            Transfers Overall transfer level: Needs assistance Equipment used: Rolling walker (2 wheels) Transfers: Sit to/from Stand Sit to Stand: Mod assist Stand pivot transfers: Mod assist         General transfer comment: Mod A for support with progressing LLE    Ambulation/Gait                   Stairs             Wheelchair Mobility    Modified Rankin (Stroke Patients Only)       Balance Overall balance assessment: Needs assistance Sitting-balance support: Feet supported Sitting balance-Leahy Scale: Good Sitting balance - Comments: supervision static sitting EOB Postural control: Right lateral lean Standing balance support: Bilateral upper extremity supported, During functional activity, Reliant on assistive device for balance Standing balance-Leahy Scale: Poor Standing balance comment: pt reliant on external support                            Cognition Arousal/Alertness: Awake/alert Behavior During Therapy: Anxious Overall Cognitive Status: Impaired/Different from baseline Area of Impairment: Problem solving  Following Commands: Follows one step commands with increased time     Problem Solving: Slow processing, Decreased initiation, Requires verbal cues, Requires tactile cues General Comments: Diffiuclty with attention on left side of body        Exercises Other Exercises Other Exercises: AAROM LUE Shoulder Flexion with RUE 1 x 30 Other Exercises: AAROM LUE Shoulder Abduction with RUE 1 x  30 Other Exercises: AAROM LLE knee ext with RLE 1 x 30 Other Exercises: Lateral weight shifts x 30 with BUE support Other Exercises: Mini-Squats x 5 with BUE support    General Comments        Pertinent Vitals/Pain Pain Assessment Pain Assessment: No/denies pain    Home Living                          Prior Function            PT Goals (current goals can now be found in the care plan section) Acute Rehab PT Goals Patient Stated Goal: did not state PT Goal Formulation: Patient unable to participate in goal setting Time For Goal Achievement: 08/29/22 Potential to Achieve Goals: Fair Progress towards PT goals: Progressing toward goals    Frequency    7X/week      PT Plan Current plan remains appropriate    Co-evaluation              AM-PAC PT "6 Clicks" Mobility   Outcome Measure  Help needed turning from your back to your side while in a flat bed without using bedrails?: A Little Help needed moving from lying on your back to sitting on the side of a flat bed without using bedrails?: A Little Help needed moving to and from a bed to a chair (including a wheelchair)?: A Lot Help needed standing up from a chair using your arms (e.g., wheelchair or bedside chair)?: A Lot Help needed to walk in hospital room?: A Lot Help needed climbing 3-5 steps with a railing? : Total 6 Click Score: 13    End of Session Equipment Utilized During Treatment: Gait belt Activity Tolerance: Patient tolerated treatment well Patient left: in chair;with chair alarm set;with call bell/phone within reach Nurse Communication: Mobility status PT Visit Diagnosis: Other symptoms and signs involving the nervous system (R29.898);Muscle weakness (generalized) (M62.81);Other abnormalities of gait and mobility (R26.89);Unsteadiness on feet (R26.81);Difficulty in walking, not elsewhere classified (R26.2)     Time: 1420-1500 PT Time Calculation (min) (ACUTE ONLY): 40 min  Charges:   $Therapeutic Exercise: 8-22 mins $Therapeutic Activity: 23-37 mins                    Bradly Chris PT, DPT  08/19/2022, 3:23 PM

## 2022-08-20 DIAGNOSIS — F0390 Unspecified dementia without behavioral disturbance: Secondary | ICD-10-CM

## 2022-08-20 DIAGNOSIS — I10 Essential (primary) hypertension: Secondary | ICD-10-CM

## 2022-08-20 DIAGNOSIS — N179 Acute kidney failure, unspecified: Secondary | ICD-10-CM

## 2022-08-20 DIAGNOSIS — R531 Weakness: Secondary | ICD-10-CM

## 2022-08-20 DIAGNOSIS — N1831 Chronic kidney disease, stage 3a: Secondary | ICD-10-CM

## 2022-08-20 DIAGNOSIS — E7800 Pure hypercholesterolemia, unspecified: Secondary | ICD-10-CM

## 2022-08-20 DIAGNOSIS — E1142 Type 2 diabetes mellitus with diabetic polyneuropathy: Secondary | ICD-10-CM

## 2022-08-20 LAB — GLUCOSE, CAPILLARY
Glucose-Capillary: 143 mg/dL — ABNORMAL HIGH (ref 70–99)
Glucose-Capillary: 131 mg/dL — ABNORMAL HIGH (ref 70–99)
Glucose-Capillary: 227 mg/dL — ABNORMAL HIGH (ref 70–99)
Glucose-Capillary: 184 mg/dL — ABNORMAL HIGH (ref 70–99)
Glucose-Capillary: 106 mg/dL — ABNORMAL HIGH (ref 70–99)

## 2022-08-20 MED ORDER — ENSURE ENLIVE PO LIQD: 237.0000 mL | Freq: Two times a day (BID) | ORAL | 12 refills | Status: DC

## 2022-08-20 MED ORDER — LISINOPRIL 5 MG PO TABS: 5.0000 mg | ORAL_TABLET | Freq: Every day | ORAL | 0 refills | Status: DC

## 2022-08-20 NOTE — Discharge Summary (Signed)
Physician Discharge Summary   Patient: Mitchell Washington MRN: [id number] DOB: November 24, 1950  Admit date:     08/14/2022  Discharge date: 08/20/22  Discharge Physician: Enedina Finner   PCP: Kandyce Rud, MD  Discharge Diagnoses: Principal Problem:   Left-sided weakness Active Problems:   History of multiple strokes   Acute renal failure superimposed on stage 3b chronic kidney disease (HCC)   Dementia without behavioral disturbance (HCC)   Primary hypertension   Type 2 diabetes mellitus with peripheral neuropathy (HCC)   Pure hypercholesterolemia   AKI (acute kidney injury) [facility])   [facility] 72 year old male with past medical history of diabetes mellitus type 2, CKD 3B, hypertension, dementia and previous CVA most recently 1 month prior with multiple bilateral cerebellar infarcts status post balloon angioplasty for posterior circulation occlusion who presented to the emergency room on 12/27 with left-sided weakness.    Left-sided weakness suspected due to extension of recent CVA (1 month ago) --Initially thought to be new CVA, but MRI confirms only extension of old CVA.   --Given that patient initially presented with borderline hypotension, this is likely exacerbation of symptoms brought on by previous CVA from hypotension.  -- Neurology following.  Will carefully limit home medications.   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 Mildly improved appearance of the pronounced distal Left Vertebral Artery and mid Basilar artery stenoses since 07/15/2022, but hemodynamically significant narrowing remains.Mildly improved appearance also bilateral PCA enhancement, distal left PCA branches remain occluded. 2. Superimposed significant bilateral supraclinoid ICA atherosclerotic stenoses also re-demonstrated and stable. Anterior circulation remains patent. Stable  moderate to severe Left A1 stenosis.   --Patient seen by PT and OT who are recommending acute patient rehab.  awaiting insurance approval for CIR   History of multiple strokes History of bilateral cerebellar infarcts 07/16/2022 s/p balloon angioplasty for posterior circulation occlusion --Continue aspirin and Brilinta   Acute renal failure superimposed on stage 3b chronic kidney disease (HCC) Creatinine 2.75, up from 1.38, Due to hypotension.   --creat 2.75--IVF--1.6--1.3   Dementia without behavioral disturbance (HCC) Continue donepezil and memantine    Primary hypertension Resumed bp meds at lowers dose from before   Type 2 diabetes mellitus with peripheral neuropathy (HCC) Sliding scale insulin coverage Resume Glipizide at d/c           Procedures:none Family communication :wife aware of plans Consults :Neuro CODE STATUS: DNR DVT Prophylaxis :lovenox  Disposition:  CIR when insurance approval obtained Diet recommendation:  Cardiac and Carb modified diet DISCHARGE MEDICATION: Allergies as of 08/20/2022   No Known Allergies      Medication List     STOP taking these medications    clopidogrel 75 MG tablet Commonly known as: PLAVIX       TAKE these medications    acetaminophen 325 MG tablet Commonly known as: TYLENOL Take 2 tablets (650 mg total) by mouth every 4 (four) hours as needed for mild pain (or temp > 37.5 C (99.5 F)).   amLODipine 2.5 MG tablet Commonly known as: NORVASC Take 1 tablet (2.5 mg total) by mouth daily.   aspirin EC 81 MG tablet Take 81 mg by mouth daily.   atorvastatin 80 MG tablet Commonly known as: LIPITOR Take 1 tablet (80 mg total) by mouth daily.   Brilinta 90 MG Tabs tablet Generic drug: ticagrelor Take 1 tablet (90 mg total) by mouth 2 (two) times daily.  cyanocobalamin 1000 MCG tablet Commonly known as: VITAMIN B12 Take 1 tablet (1,000 mcg total) by mouth daily.   diclofenac Sodium 1 % Gel Commonly known as:  VOLTAREN Apply 2 g topically 4 (four) times daily.   donepezil 10 MG tablet Commonly known as: ARICEPT Take 10 mg by mouth at bedtime.   feeding supplement Liqd Take 237 mLs by mouth 2 (two) times daily between meals.   glipiZIDE 2.5 MG 24 hr tablet Commonly known as: GLUCOTROL XL Take 1 tablet (2.5 mg total) by mouth daily before breakfast.   lisinopril 5 MG tablet Commonly known as: ZESTRIL Take 1 tablet (5 mg total) by mouth daily. What changed:  medication strength how much to take   memantine 10 MG tablet Commonly known as: NAMENDA Take 10 mg by mouth 2 (two) times daily.        Discharge Exam: Filed Weights   08/14/22 1457  Weight: 75.6 kg   GENERAL:  72 y.o.-year-old patient lying in the bed with no acute distress.  LUNGS: Normal breath sounds bilaterally, no wheezing CARDIOVASCULAR: S1, S2 normal. No murmur   ABDOMEN: Soft, nontender, nondistended. Bowel sounds present.  EXTREMITIES: No  edema b/l.    NEUROLOGIC:left side hemiparesis  Condition at discharge: fair  The results of significant diagnostics from this hospitalization (including imaging, microbiology, ancillary and laboratory) are listed below for reference.   Imaging Studies: CT ANGIO HEAD W OR WO CONTRAST  Result Date: 08/17/2022 CLINICAL DATA:  72 year old male status post bilateral cerebellar infarcts and brainstem about a month ago. Bilateral PCA occlusions. Status post endovascular treatment of high-grade Basilar artery and vertebrobasilar stenoses. EXAM: CT ANGIOGRAPHY HEAD TECHNIQUE: Multidetector CT imaging of the head was performed using the standard protocol during bolus administration of intravenous contrast. Multiplanar CT image reconstructions and MIPs were obtained to evaluate the vascular anatomy. RADIATION DOSE REDUCTION: This exam was performed according to the departmental dose-optimization program which includes automated exposure control, adjustment of the mA and/or kV according  to patient size and/or use of iterative reconstruction technique. CONTRAST:  56mL OMNIPAQUE IOHEXOL 350 MG/ML SOLN COMPARISON:  Head CT 08/14/2022. Brain MRI yesterday.  Post treatment intracranial MRA 07/19/2022. FINDINGS: CT HEAD Brain: Stable non contrast CT appearance of the brain. No acute intracranial hemorrhage identified. No midline shift, mass effect, or evidence of intracranial mass lesion. Chronic cerebellar, brainstem, left greater than right PCA, and left MCA territory infarcts. Calvarium and skull base: No acute osseous abnormality identified. Paranasal sinuses: Stable paranasal sinuses. Tympanic cavities and mastoids remain clear. Orbits: No acute orbit or scalp soft tissue finding. CTA HEAD Posterior circulation: Dominant left vertebral V4 segment. Severe distal vertebral artery atherosclerosis and tandem stenoses. Functionally occluded distal right vertebral at the vertebrobasilar junction, unchanged. Enhancement of the distal left vertebral artery is mildly improved compared to 07/15/2022. Left PICA origin remains patent. Severe proximal basilar artery stenosis persists (series 11 image 132 and series 13, image 22 today. This appears slightly improved since prior CTA. Basilar tip and SCA origins remain patent. Highly irregular PCA origins are patent, with improved bilateral PCA enhancement especially on the right. Left PCA P3 branches remain attenuated. Posterior communicating arteries are diminutive or absent. Anterior circulation: Both ICA siphons are patent. On the right moderate calcified plaque, and moderate to severe short segment supraclinoid stenosis (series 16, image 97) appears stable. Right ICA terminus remains patent. On the left similar bulky supraclinoid plaque and at least moderate supraclinoid stenosis (series 16, image 115) appears stable. Left ICA  terminus remains patent. MCA and ACA origins remain patent. Severe left A1 stenosis appears stable. Anterior communicating artery and  bilateral ACA branches remain within normal limits. Left MCA M1 segment is patent with mild irregularity at the bifurcation. Left MCA branches are stable and within normal limits. Right MCA M1 segment bifurcates early without stenosis. Right MCA branches are stable with mild posterior M2 segment irregularity. Venous sinuses: Early contrast timing, superior sagittal sinus is patent. Anatomic variants: Dominant left vertebral artery. Review of the MIP images confirms the above findings IMPRESSION: 1. 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 Mildly improved appearance of the pronounced distal Left Vertebral Artery and mid Basilar artery stenoses since 07/15/2022, but hemodynamically significant narrowing remains. Mildly improved appearance also bilateral PCA enhancement, distal left PCA branches remain occluded. 2. Superimposed significant bilateral supraclinoid ICA atherosclerotic stenoses also re-demonstrated and stable. Anterior circulation remains patent. Stable moderate to severe Left A1 stenosis. 3. Stable CT appearance of the brain, no new intracranial abnormality. Electronically Signed   By: Odessa Fleming M.D.   On: 08/17/2022 12:40   MR BRAIN WO CONTRAST  Result Date: 08/14/2022 CLINICAL DATA:  Initial evaluation for neuro deficit, stroke suspected. Recent hospitalization for stroke, status post posterior circulation angioplasty. EXAM: MRI HEAD WITHOUT CONTRAST TECHNIQUE: Multiplanar, multiecho pulse sequences of the brain and surrounding structures were obtained without intravenous contrast. COMPARISON:  Prior CT from earlier the same day as well as recent MRI from 12/17/2021. FINDINGS: Brain: Age-related cerebral atrophy. Patchy and confluent T2/FLAIR hyperintensity involving the periventricular deep white matter both cerebral hemispheres, consistent with chronic small vessel ischemic disease. Patchy involvement of the pons. Remote hemorrhagic lacunar infarct noted at the left basal  ganglia. Remote bilateral PCA territory infarcts, left larger than right noted. Few scattered remote bilateral cerebellar infarcts. Previously seen small scattered cerebellar infarcts have largely resolved, with only faint residual punctate diffusion signal abnormality at the left cerebellum (series 5, image 9). There has been interval expansion of the previously a seen right pontine infarct, slightly increased in size as compared to previous exam, with further posterior extension towards the floor of the fourth ventricle (series 5, image 15). Degree of new/interval infarction best appreciated on ADC map (series 6, image 15). No associated hemorrhage or mass effect. No other evidence for new or interval infarct elsewhere. No acute intracranial hemorrhage. No mass lesion, midline shift or mass effect. Mild ex vacuo dilatation of the left lateral ventricle related to the chronic left basal ganglia infarct. No hydrocephalus. No extra-axial fluid collection. Pituitary gland and suprasellar region within normal limits. 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 Major intracranial vascular flow voids are otherwise maintained. Skull and upper cervical spine: Craniocervical junction within normal limits. Bone marrow signal intensity heterogeneous but overall within normal limits. No scalp soft tissue abnormality. Sinuses/Orbits: Prior bilateral ocular lens replacement. Scattered mucosal thickening noted about the sphenoethmoidal and maxillary sinuses. Small right maxillary sinus retention cyst. No mastoid effusion. Other: None. IMPRESSION: 1. Slight interval expansion of the previously seen right pontine infarct, slightly increased in size as compared to previous, with further posterior extension towards the floor of the fourth ventricle. No associated hemorrhage or mass effect. 2. Previously seen scattered  small cerebellar infarcts have largely resolved, with only faint residual diffusion signal abnormality at the left cerebellum. 3. No other new acute intracranial abnormality. 4. Underlying atrophy with chronic microvascular ischemic disease, with additional remote infarcts  involving the left basal ganglia, bilateral PCA territories, and bilateral cerebellar hemispheres. Electronically Signed   By: Jeannine Boga M.D.   On: 08/14/2022 22:47   DG Chest Portable 1 View  Result Date: 08/14/2022 CLINICAL DATA:  Weakness EXAM: PORTABLE CHEST 1 VIEW COMPARISON:  07/15/2022 FINDINGS: Heart and mediastinal contours are within normal limits. No focal opacities or effusions. No acute bony abnormality. Aortic atherosclerosis. IMPRESSION: No active cardiopulmonary disease. Electronically Signed   By: Rolm Baptise M.D.   On: 08/14/2022 20:03   CT HEAD WO CONTRAST (5MM)  Result Date: 08/14/2022 CLINICAL DATA:  TIA EXAM: CT HEAD WITHOUT CONTRAST TECHNIQUE: Contiguous axial images were obtained from the base of the skull through the vertex without intravenous contrast. RADIATION DOSE REDUCTION: This exam was performed according to the departmental dose-optimization program which includes automated exposure control, adjustment of the mA and/or kV according to patient size and/or use of iterative reconstruction technique. COMPARISON:  None Available. FINDINGS: Brain: Old left occipital infarct. Old left basal ganglia lacunar infarct. There is atrophy and chronic small vessel disease changes. No acute intracranial abnormality. Specifically, no hemorrhage, hydrocephalus, mass lesion, acute infarction, or significant intracranial injury. Vascular: No hyperdense vessel or unexpected calcification. Skull: No acute calvarial abnormality. Sinuses/Orbits: No acute findings Other: None IMPRESSION: Old left infarct as above. Atrophy, chronic microvascular disease. No acute intracranial abnormality. Electronically Signed   By:  Rolm Baptise M.D.   On: 08/14/2022 15:29   DG Knee Left Port  Result Date: 07/24/2022 CLINICAL DATA:  Pain.  No injury. EXAM: PORTABLE LEFT KNEE - 1-2 VIEW COMPARISON:  None Available. FINDINGS: No fracture. No dislocation. There are bidirectional enthesopathic changes of the patella. Vascular calcifications are present. No radiopaque foreign body. No effusion. IMPRESSION: Patellofemoral compartment predominant degenerative change. No fracture or dislocation. Electronically Signed   By: Marin Roberts M.D.   On: 07/24/2022 10:33    Microbiology: Results for orders placed or performed during the hospital encounter of 08/14/22  Resp panel by RT-PCR (RSV, Flu A&B, Covid) Anterior Nasal Swab     Status: None   Collection Time: 08/14/22  7:28 PM   Specimen: Anterior Nasal Swab  Result Value Ref Range Status   SARS Coronavirus 2 by RT PCR NEGATIVE NEGATIVE Final    Comment: (NOTE) SARS-CoV-2 target nucleic acids are NOT DETECTED.  The SARS-CoV-2 RNA is generally detectable in upper respiratory specimens during the acute phase of infection. The lowest concentration of SARS-CoV-2 viral copies this assay can detect is 138 copies/mL. A negative result does not preclude SARS-Cov-2 infection and should not be used as the sole basis for treatment or other patient management decisions. A negative result may occur with  improper specimen collection/handling, submission of specimen other than nasopharyngeal swab, presence of viral mutation(s) within the areas targeted by this assay, and inadequate number of viral copies(<138 copies/mL). A negative result must be combined with clinical observations, patient history, and epidemiological information. The expected result is Negative.  Fact Sheet for Patients:  EntrepreneurPulse.com.au  Fact Sheet for Healthcare Providers:  IncredibleEmployment.be  This test is no t yet approved or cleared by the [place] FDA and   has been authorized for detection and/or diagnosis of SARS-CoV-2 by FDA under an Emergency Use Authorization (EUA). This EUA will remain  in effect (meaning this test can be used) for the duration of the COVID-19 declaration under Section 564(b)(1) of the Act, 21 U.S.C.section 360bbb-3(b)(1), unless the authorization is terminated  or revoked sooner.  Influenza A by PCR NEGATIVE NEGATIVE Final   Influenza B by PCR NEGATIVE NEGATIVE Final    Comment: (NOTE) The Xpert Xpress SARS-CoV-2/FLU/RSV plus assay is intended as an aid in the diagnosis of influenza from Nasopharyngeal swab specimens and should not be used as a sole basis for treatment. Nasal washings and aspirates are unacceptable for Xpert Xpress SARS-CoV-2/FLU/RSV testing.  Fact Sheet for Patients: BloggerCourse.com  Fact Sheet for Healthcare Providers: SeriousBroker.it  This test is not yet approved or cleared by the [place] FDA and has been authorized for detection and/or diagnosis of SARS-CoV-2 by FDA under an Emergency Use Authorization (EUA). This EUA will remain in effect (meaning this test can be used) for the duration of the COVID-19 declaration under Section 564(b)(1) of the Act, 21 U.S.C. section 360bbb-3(b)(1), unless the authorization is terminated or revoked.     Resp Syncytial Virus by PCR NEGATIVE NEGATIVE Final    Comment: (NOTE) Fact Sheet for Patients: BloggerCourse.com  Fact Sheet for Healthcare Providers: SeriousBroker.it  This test is not yet approved or cleared by the [place] FDA and has been authorized for detection and/or diagnosis of SARS-CoV-2 by FDA under an Emergency Use Authorization (EUA). This EUA will remain in effect (meaning this test can be used) for the duration of the COVID-19 declaration under Section 564(b)(1) of the Act, 21 U.S.C. section 360bbb-3(b)(1),  unless the authorization is terminated or revoked.  Performed at [facility], [address] [place], [place] [address]     Labs: CBC: Recent Labs  Lab 08/14/22 1504 08/16/22 0539  WBC 6.2 6.2  HGB 12.3* 11.8*  HCT 38.8* 37.2*  MCV 84.7 83.4  PLT 297 261   Basic Metabolic Panel: Recent Labs  Lab 08/14/22 1622 08/16/22 0539 08/17/22 0856  NA 139 142  --   K 5.4* 4.9  --   CL 110 112*  --   CO2 20* 21*  --   GLUCOSE 131* 107*  --   BUN 57* 41*  --   CREATININE 2.75* 1.61* 1.35*  CALCIUM 8.9 9.1  --    Liver Function Tests: Recent Labs  Lab 08/14/22 1622  AST 83*  ALT 189*  ALKPHOS 77  BILITOT 0.5  PROT 7.8  ALBUMIN 3.8   CBG: Recent Labs  Lab 08/19/22 1320 08/19/22 1510 08/19/22 1711 08/19/22 2151 08/20/22 0845  GLUCAP 149* 184* 181* 123* 131*    Discharge time spent: greater than 30 minutes.  Signed: Enedina Finner, MD [facility] 08/20/2022

## 2022-08-20 NOTE — Progress Notes (Signed)
Physical Therapy Treatment Patient Details Name: Mitchell Washington MRN: [id number] DOB: 07-11-1951 Today's Date: 08/20/2022   History of Present Illness 72 y/o male presented to ED on 08/14/22 for L sided weakness and difficulty walking. MRI showed slight expansion of previous R pontine infarct. Recent admission to [facility] 12/7-12/14 for R CVA. 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 Patient complaining of L knee pain while in supine and with movement towards EOB. Required modA to stand from EOB with cues for hand placement. Once in standing, patient complaining of significant L knee pain requiring modA to take steps towards [facility] with RW. Able to maintain grasp on RW with L UE throughout but needed assistance for RW management, balance, and at times L LE advancement. Wife reports patient attempting to climb out of chair on her arrival. Nursing staff assisted patient back to bed. Patient denies pain this AM but reports starting after he returned to bed. Notified MD and RN of L knee pain. Continue to recommend acute inpatient rehab ([facility]) for post-acute therapy needs.     Recommendations for follow up therapy are one component of a multi-disciplinary discharge planning process, led by the attending physician.  Recommendations may be updated based on patient status, additional functional criteria and insurance authorization.  Follow Up Recommendations  Acute inpatient rehab (3hours/day)     Assistance Recommended at Discharge Frequent or constant Supervision/Assistance  Patient can return home with the following A lot of help with walking and/or transfers;A lot of help with bathing/dressing/bathroom   Equipment Recommendations  None recommended by PT    Recommendations for Other Services Rehab consult     Precautions / Restrictions  Precautions Precautions: Fall Precaution Comments: L hemiparesis, dementia Restrictions Weight Bearing Restrictions: No     Mobility  Bed Mobility Overal bed mobility: Needs Assistance Bed Mobility: Supine to Sit, Sit to Supine     Supine to sit: Mod assist Sit to supine: Mod assist   General bed mobility comments: assist for trunk elevation and BLE management in/out of bed. Cues for sequencing    Transfers Overall transfer level: Needs assistance Equipment used: Rolling Bowie Doiron (2 wheels) Transfers: Sit to/from Stand Sit to Stand: Mod assist           General transfer comment: cues for hand placement. ModA to stand from EOB    Ambulation/Gait Ambulation/Gait assistance: Mod assist Gait Distance (Feet): 5 Feet Assistive device: Rolling Kyrielle Urbanski (2 wheels) Gait Pattern/deviations: Step-to pattern, Decreased step length - right, Decreased stance time - left Gait velocity: decreased     General Gait Details: took lateral steps towards HOB. Complaining of pain in L knee with weightbearing. Required assist for balance, RW management, and at times L foot advancement due to pain.   Stairs             Wheelchair Mobility    Modified Rankin (Stroke Patients Only)       Balance Overall balance assessment: Needs assistance Sitting-balance support: Feet supported Sitting balance-Leahy Scale: Good     Standing balance support: Bilateral upper extremity supported, Reliant on assistive device for balance Standing balance-Leahy Scale: Poor                              Cognition Arousal/Alertness: Awake/alert Behavior During Therapy: Flat  affect Overall Cognitive Status: History of cognitive impairments - at baseline Area of Impairment: Attention, Memory, Awareness, Following commands, Safety/judgement, Problem solving                 Orientation Level: Disoriented to, Time, Situation Current Attention Level: Focused Memory: Decreased  short-term memory Following Commands: Follows one step commands with increased time Safety/Judgement: Decreased awareness of deficits Awareness: Intellectual Problem Solving: Slow processing, Decreased initiation, Requires verbal cues, Requires tactile cues, Difficulty sequencing          Exercises      General Comments        Pertinent Vitals/Pain Pain Assessment Pain Assessment: Faces Faces Pain Scale: Hurts whole lot Pain Location: L knee with weightbearing and at rest Pain Descriptors / Indicators: Discomfort, Grimacing, Guarding Pain Intervention(s): Monitored during session, Repositioned    Home Living                          Prior Function            PT Goals (current goals can now be found in the care plan section) Acute Rehab PT Goals PT Goal Formulation: Patient unable to participate in goal setting Time For Goal Achievement: 08/29/22 Potential to Achieve Goals: Fair Progress towards PT goals: Progressing toward goals    Frequency    7X/week      PT Plan Current plan remains appropriate    Co-evaluation              AM-PAC PT "6 Clicks" Mobility   Outcome Measure  Help needed turning from your back to your side while in a flat bed without using bedrails?: A Little Help needed moving from lying on your back to sitting on the side of a flat bed without using bedrails?: A Lot Help needed moving to and from a bed to a chair (including a wheelchair)?: A Lot Help needed standing up from a chair using your arms (e.g., wheelchair or bedside chair)?: A Lot Help needed to walk in hospital room?: A Lot Help needed climbing 3-5 steps with a railing? : Total 6 Click Score: 12    End of Session Equipment Utilized During Treatment: Gait belt Activity Tolerance: Patient limited by pain Patient left: in bed;with call bell/phone within reach;with bed alarm set;with family/visitor present Nurse Communication: Mobility status;Other (comment) (L  knee pain) PT Visit Diagnosis: Other symptoms and signs involving the nervous system (R29.898);Muscle weakness (generalized) (M62.81);Other abnormalities of gait and mobility (R26.89);Unsteadiness on feet (R26.81);Difficulty in walking, not elsewhere classified (R26.2)     Time: 3785-8850 PT Time Calculation (min) (ACUTE ONLY): 24 min  Charges:  $Therapeutic Activity: 23-37 mins                     Kendrell Lottman A. Gilford Rile PT, DPT Alsey A Yetzali Weld 08/20/2022, 11:55 AM

## 2022-08-20 NOTE — Plan of Care (Signed)
  Problem: Education: Goal: Understanding of CV disease, CV risk reduction, and recovery process will improve Outcome: Progressing Goal: Individualized Educational Video(s) Outcome: Progressing   Problem: Activity: Goal: Ability to return to baseline activity level will improve Outcome: Progressing   Problem: Cardiovascular: Goal: Ability to achieve and maintain adequate cardiovascular perfusion will improve Outcome: Progressing Goal: Vascular access site(s) Level 0-1 will be maintained Outcome: Progressing   Problem: Health Behavior/Discharge Planning: Goal: Ability to safely manage health-related needs after discharge will improve Outcome: Progressing   Problem: Education: Goal: Knowledge of disease or condition will improve Outcome: Progressing Goal: Knowledge of secondary prevention will improve (MUST DOCUMENT ALL) Outcome: Progressing Goal: Knowledge of patient specific risk factors will improve Elta [place] N/A or DELETE if not current risk factor) Outcome: Progressing   Problem: Ischemic Stroke/TIA Tissue Perfusion: Goal: Complications of ischemic stroke/TIA will be minimized Outcome: Progressing   Problem: Coping: Goal: Will verbalize positive feelings about self Outcome: Progressing Goal: Will identify appropriate support needs Outcome: Progressing   Problem: Health Behavior/Discharge Planning: Goal: Ability to manage health-related needs will improve Outcome: Progressing Goal: Goals will be collaboratively established with patient/family Outcome: Progressing   Problem: Self-Care: Goal: Ability to participate in self-care as condition permits will improve Outcome: Progressing Goal: Verbalization of feelings and concerns over difficulty with self-care will improve Outcome: Progressing Goal: Ability to communicate needs accurately will improve Outcome: Progressing   Problem: Nutrition: Goal: Risk of aspiration will decrease Outcome: Progressing Goal: Dietary  intake will improve Outcome: Progressing   Problem: Education: Goal: Ability to describe self-care measures that may prevent or decrease complications (Diabetes Survival Skills Education) will improve Outcome: Progressing Goal: Individualized Educational Video(s) Outcome: Progressing   Problem: Coping: Goal: Ability to adjust to condition or change in health will improve Outcome: Progressing   Problem: Fluid Volume: Goal: Ability to maintain a balanced intake and output will improve Outcome: Progressing   Problem: Health Behavior/Discharge Planning: Goal: Ability to identify and utilize available resources and services will improve Outcome: Progressing Goal: Ability to manage health-related needs will improve Outcome: Progressing   Problem: Metabolic: Goal: Ability to maintain appropriate glucose levels will improve Outcome: Progressing   Problem: Nutritional: Goal: Maintenance of adequate nutrition will improve Outcome: Progressing Goal: Progress toward achieving an optimal weight will improve Outcome: Progressing   Problem: Skin Integrity: Goal: Risk for impaired skin integrity will decrease Outcome: Progressing   Problem: Tissue Perfusion: Goal: Adequacy of tissue perfusion will improve Outcome: Progressing   Problem: Education: Goal: Knowledge of General Education information will improve Description: Including pain rating scale, medication(s)/side effects and non-pharmacologic comfort measures Outcome: Progressing   Problem: Health Behavior/Discharge Planning: Goal: Ability to manage health-related needs will improve Outcome: Progressing   Problem: Clinical Measurements: Goal: Ability to maintain clinical measurements within normal limits will improve Outcome: Progressing Goal: Will remain free from infection Outcome: Progressing Goal: Diagnostic test results will improve Outcome: Progressing Goal: Respiratory complications will improve Outcome:  Progressing Goal: Cardiovascular complication will be avoided Outcome: Progressing   Problem: Activity: Goal: Risk for activity intolerance will decrease Outcome: Progressing   Problem: Nutrition: Goal: Adequate nutrition will be maintained Outcome: Progressing   Problem: Coping: Goal: Level of anxiety will decrease Outcome: Progressing   Problem: Elimination: Goal: Will not experience complications related to bowel motility Outcome: Progressing Goal: Will not experience complications related to urinary retention Outcome: Progressing

## 2022-08-20 NOTE — Care Management Important Message (Signed)
Important Message  Patient Details  Name: Mitchell Washington MRN: [id number] Date of Birth: [birth year]   Medicare Important Message Given:  Yes     Juliann Pulse A Veronica Fretz 08/20/2022, 2:32 PM

## 2022-08-20 NOTE — Progress Notes (Signed)
Inpatient Rehab Admissions Coordinator:   Following for my colleague Danne Baxter.  Will open insurance with [facility] today.   Shann Medal, PT, DPT Admissions Coordinator [phone] 08/20/22  10:01 AM

## 2022-08-20 NOTE — Progress Notes (Signed)
Occupational Therapy Treatment Patient Details Name: Mitchell Washington MRN: [id number] DOB: 29-May-1951 Today's Date: 08/20/2022   History of present illness Pt is a 72 y/o male presented to ED on 08/14/22 for L sided weakness and difficulty walking. MRI showed slight expansion of previous R pontine infarct. Recent admission to [facility] 12/7-12/14 for R CVA. 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 RN present giving meds. Tx session targeted improving tolerance for functional mobility in the setting of ADL tasks. Pt required Mod A to come to sitting EOB, Mod A for stand pivot transfer from EOB>recliner using RW, and set up A for self-feeding. Pt required step by step VC for all mobility. Pt left sitting in recliner with all needs in reach. Pt is making progress toward goal completion. D/C recommendation remains appropriate. OT will continue to follow acutely.    Recommendations for follow up therapy are one component of a multi-disciplinary discharge planning process, led by the attending physician.  Recommendations may be updated based on patient status, additional functional criteria and insurance authorization.    Follow Up Recommendations  Acute inpatient rehab (3hours/day)     Assistance Recommended at Discharge Frequent or constant Supervision/Assistance  Patient can return home with the following  A little help with walking and/or transfers;A little help with bathing/dressing/bathroom;Assistance with cooking/housework;Direct supervision/assist for financial management;Assist for transportation;Help with stairs or ramp for entrance;Direct supervision/assist for medications management   Equipment Recommendations  Other (comment) (defer to next venue of care)    Recommendations for Other Services Rehab consult    Precautions /  Restrictions Precautions Precautions: Fall Precaution Comments: L hemiparesis, dementia Restrictions Weight Bearing Restrictions: No       Mobility Bed Mobility Overal bed mobility: Needs Assistance Bed Mobility: Supine to Sit     Supine to sit: Mod assist     General bed mobility comments: Mod A for BLE management and trunk elevation, multimodal cues for sequencing    Transfers Overall transfer level: Needs assistance Equipment used: Rolling walker (2 wheels) Transfers: Bed to chair/wheelchair/BSC   Stand pivot transfers: Mod assist         General transfer comment: Step by step VC, assist for RW management and for progressing LLE     Balance Overall balance assessment: Needs assistance Sitting-balance support: Feet supported Sitting balance-Leahy Scale: Good     Standing balance support: Bilateral upper extremity supported, Reliant on assistive device for balance Standing balance-Leahy Scale: Poor                             ADL either performed or assessed with clinical judgement   ADL Overall ADL's : Needs assistance/impaired Eating/Feeding: Set up;Sitting                                          Extremity/Trunk Assessment Upper Extremity Assessment Upper Extremity Assessment: Generalized weakness   Lower Extremity Assessment Lower Extremity Assessment: Generalized weakness   Cervical / Trunk Assessment Cervical / Trunk Assessment: Kyphotic    Vision       Perception Perception Perception: Impaired Inattention/Neglect: Does not attend to left side of body   Praxis Praxis Praxis: Impaired Praxis Impairment Details: Motor planning;Perseveration  Cognition Arousal/Alertness: Awake/alert Behavior During Therapy: Flat affect Overall Cognitive Status: History of cognitive impairments - at baseline Area of Impairment: Orientation                 Orientation Level: Disoriented to, Time, Situation Current  Attention Level: Focused Memory: Decreased short-term memory Following Commands: Follows one step commands with increased time Safety/Judgement: Decreased awareness of deficits Awareness: Intellectual Problem Solving: Slow processing, Decreased initiation, Requires verbal cues, Requires tactile cues, Difficulty sequencing          Exercises      Shoulder Instructions       General Comments      Pertinent Vitals/ Pain       Pain Assessment Pain Assessment: No/denies pain  Home Living                                          Prior Functioning/Environment              Frequency  Min 3X/week        Progress Toward Goals  OT Goals(current goals can now be found in the care plan section)  Progress towards OT goals: Progressing toward goals  Acute Rehab OT Goals Patient Stated Goal: improve function OT Goal Formulation: With patient/family Time For Goal Achievement: 08/29/22 Potential to Achieve Goals: Good  Plan Discharge plan remains appropriate;Frequency remains appropriate    Co-evaluation                 AM-PAC OT "6 Clicks" Daily Activity     Outcome Measure   Help from another person eating meals?: A Little Help from another person taking care of personal grooming?: A Lot Help from another person toileting, which includes using toliet, bedpan, or urinal?: A Lot Help from another person bathing (including washing, rinsing, drying)?: A Lot Help from another person to put on and taking off regular upper body clothing?: A Lot Help from another person to put on and taking off regular lower body clothing?: A Lot 6 Click Score: 13    End of Session Equipment Utilized During Treatment: Gait belt;Rolling walker (2 wheels)  OT Visit Diagnosis: Unsteadiness on feet (R26.81);Other abnormalities of gait and mobility (R26.89);Muscle weakness (generalized) (M62.81);Hemiplegia and hemiparesis   Activity Tolerance Patient tolerated  treatment well   Patient Left in chair;with call bell/phone within reach;with chair alarm set   Nurse Communication Mobility status        Time: 4332-9518 OT Time Calculation (min): 16 min  Charges: OT General Charges $OT Visit: 1 Visit OT Treatments $Self Care/Home Management : 8-22 mins  [facility] MS, OTR/L ascom [phone]  08/20/22, 10:29 AM

## 2022-08-20 NOTE — PMR Pre-admission (Signed)
PMR Admission Coordinator Pre-Admission Assessment  Patient: Mitchell Washington is an 72 y.o., male MRN: [id number] DOB: 09-23-1950 Height: _0  (175.3 cm) Weight: 75.6 kg  Insurance Information HMO: yes    PPO:      PCP:      IPA:      80/20:      OTHER:  PRIMARY: [employer] Medicare (starting 08/19/22, Acute stay with [facility])      Policy#: [id number]      Subscriber: pt CM Name: Noralyn Pick      Phone#: [phone] (ext not provided)     Fax#: [phone] Pre-Cert#: [id number] [place] for CIR from [place] with [employer] with updates due to Hilbert Odor (ext [phone]) at fax above on 8/10      Employer:  Benefits:  Phone #: [phone]     Name:  Eff. Date: 08/19/22     Deduct: $0      Out of Pocket Max: $7550 ($0 met)      Life Max: n/a CIR: $335/day for days 1-6      SNF: $10/day for days 1-20, then $203/day for days 7-100 Outpatient:      Co-Pay: $25/visit Home Health: 100%      Co-Pay:  DME: 80%     Co-Pay: 20%  Providers:  SECONDARY: n/a      Policy#: n     Phone#:   Financial Counselor:       Phone#:   The "Data Collection Information Summary" for patients in Inpatient Rehabilitation Facilities with attached "Privacy Act [place] Records" was provided and verbally reviewed with: Patient and Family  Emergency Contact Information Contact Information     Name Relation Home Work [address]   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  Presented 08/14/2022 to [facility] hospital with acute onset of left-sided weakness as well as hypotension.  CT/MRI showed slight interval expansion of the previously seen right pontine infarction, slightly increased in size compared to previous with further posterior extension towards the floor of the fourth ventricle.  No associated hemorrhage or mass effect.  Previously seen scattered small cerebellar infarcts have largely resolved.  No other acute intracranial abnormality.  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  Superimposed significant bilateral supraclinoid ICA atherosclerotic stenosis also redemonstrated and stable.  Recent echocardiogram with ejection fraction of 60 to 65% no wall motion abnormalities.  Admission chemistries unremarkable except potassium 5.4 BUN 57 creatinine 2.75.  Neurology follow-up patient currently remains on aspirin and Brilinta as prior to admission.  Lovenox added for DVT prophylaxis.  AKI on CKD stage III elevated creatinine felt to be related to hypotension received IV fluids with latest creatinine 1.58.  He is tolerating a regular consistency diet.  Therapy evaluations completed and pt was recommended for a comprehensive rehab program.    Complete NIHSS TOTAL: 5  Patient's medical record from [facility] has been reviewed by the rehabilitation admission coordinator and physician.  Past Medical History  Past Medical History:  Diagnosis Date   CVA (cerebral infarction)    Diabetes mellitus without complication ([place])    Gout    Hyperlipidemia    Hypertension    Memory loss    Stroke [facility])    Wears dentures    partial upper    Has the patient had major surgery during 100 days prior to admission? No  Family History   family history includes Diabetes in his mother.  Current Medications  Current Facility-Administered Medications:    acetaminophen (TYLENOL) tablet 650 mg, 650 mg,  Oral, Q4H PRN, 650 mg at 08/21/22 1230 **OR** acetaminophen (TYLENOL) 160 MG/5ML solution 650 mg, 650 mg, Per Tube, Q4H PRN **OR** acetaminophen (TYLENOL) suppository 650 mg, 650 mg, Rectal, Q4H PRN, Athena Masse, MD   amLODipine (NORVASC) tablet 2.5 mg, 2.5 mg, Oral, Daily, Posey Pronto, Sona, MD, 2.5 mg at 08/22/22 7793   aspirin EC tablet 81 mg, 81 mg, Oral, Daily, Judd Gaudier V, MD, 81 mg at 08/22/22 0911   atorvastatin (LIPITOR) tablet 80 mg, 80 mg, Oral, Daily, Judd Gaudier V, MD, 80 mg at 08/22/22 9030   cyanocobalamin (VITAMIN B12) tablet 1,000 mcg, 1,000 mcg, Oral, Daily, Fritzi Mandes, MD, 1,000 mcg at 08/22/22 0911   donepezil (ARICEPT) tablet 10 mg, 10 mg, Oral, QHS, Judd Gaudier V, MD, 10 mg at 08/21/22 2140   enoxaparin (LOVENOX) injection 40 mg, 40 mg, Subcutaneous, Q24H, Dorothe Pea, RPH, 40 mg at 08/22/22 0910   feeding supplement (ENSURE ENLIVE / ENSURE PLUS) liquid 237 mL, 237 mL, Oral, BID BM, Fritzi Mandes, MD, 237 mL at 08/22/22 0911   insulin aspart (novoLOG) injection 0-15 Units, 0-15 Units, Subcutaneous, TID WC, Athena Masse, MD, 5 Units at 08/21/22 1731   insulin aspart (novoLOG) injection 0-5 Units, 0-5 Units, Subcutaneous, QHS, Athena Masse, MD   memantine [facility]) tablet 10 mg, 10 mg, Oral, BID, Athena Masse, MD, 10 mg at 08/22/22 0923   Oral care mouth rinse, 15 mL, Mouth Rinse, PRN, Fritzi Mandes, MD   ticagrelor [facility]) tablet 90 mg, 90 mg, Oral, BID, Athena Masse, MD, 90 mg at 08/22/22 0911  Patients Current Diet:  Diet Order             Diet Heart Room service appropriate? Yes; Fluid consistency: Thin  Diet effective now                   Precautions / Restrictions Precautions Precautions: Fall Precaution Comments: L hemiparesis, dementia Restrictions Weight Bearing Restrictions: No   Has the patient had 2 or more falls or a fall with injury in the past year? Yes  Prior Activity Level Community (5-7x/wk): independent,  dementia  Prior Functional Level Self Care: Did the patient need help bathing, dressing, using the toilet or eating? Needed some help  Indoor Mobility: Did the patient need assistance with walking from room to room (with or without device)? Needed some help  Stairs: Did the patient need assistance with internal or external stairs (with or without device)? Needed some help  Functional Cognition: Did the patient need help planning regular tasks such as shopping or remembering to take medications? Needed some help  Patient Information Are you of Hispanic, Latino/a,or Spanish origin?: A. No, not of Hispanic, Latino/a, or Spanish origin, X. Patient unable to respond (proxy) What is your race?: B. Black or African American, X. Patient unable to respond (proxy) Do you need or want an interpreter to communicate with a doctor or health care staff?:  9. Unable to respond  Patient's Response To:  Health Literacy and Transportation Is the patient able to respond to health literacy and transportation needs?: No Health Literacy - How often do you need to have someone help you when you read instructions, pamphlets, or other written material from your doctor or pharmacy?: Patient unable to respond In the past 12 months, has lack of transportation kept you from medical appointments or from getting medications?: No (proxy) In the past 12 months, has lack of transportation kept you from meetings, work, or from getting things needed for daily living?: No (proxy)  Development worker, international aid / [employer] Devices/Equipment: Eyeglasses Home Equipment: Shower seat, Radio producer - single point, Conservation officer, nature (2 wheels), BSC/3in1  Prior Device Use: Indicate devices/aids used by the patient prior to current illness, exacerbation or injury? Walker  Current Functional Level Cognition  Arousal/Alertness: Awake/alert Overall Cognitive Status: History of cognitive impairments - at baseline Current Attention Level:  Focused Orientation Level: Oriented to person, Oriented to place, Disoriented to time, Disoriented to situation Following Commands: Follows one step commands with increased time Safety/Judgement: Decreased awareness of deficits, Decreased awareness of safety General Comments: Oriented to self only, repetition required for single step commands    Extremity Assessment (includes Sensation/Coordination)  Upper Extremity Assessment: Generalized weakness LUE Deficits / Details: flaccid LUE- MAS 1 throughout shoulder, elbow; 1+ in wrist flexion/extension LUE Sensation: decreased light touch LUE Coordination: decreased fine motor, decreased gross motor  Lower Extremity Assessment: Generalized weakness LLE Deficits / Details: grossly ~3/5 LLE Coordination: decreased fine motor    ADLs  Overall ADL's : Needs assistance/impaired Eating/Feeding: Set up, Sitting Grooming: Cueing for sequencing, Set up, Sitting, Wash/dry face Lower Body Dressing: Maximal assistance, Sit to/from stand Lower Body Dressing Details (indicate cue type and reason): brief Toileting- Clothing Manipulation and Hygiene: Maximal assistance, Sit to/from stand Functional mobility during ADLs: Minimal assistance, Rolling walker (2 wheels), Cueing for sequencing, Cueing for safety, Moderate assistance (to take ~4 lateral steps at EOB, assist for RW management) General ADL Comments: MAX A for peri care following BM, MAX A for doffing/donning socks in seated, MOD-MAX A for UB dressing using hemi technique, MOD A for SPT bedside chair<>bsc    Mobility  Overal bed mobility: Needs Assistance Bed Mobility: Supine to Sit, Sit to Supine Rolling: Supervision Sidelying to sit: HOB elevated, Min assist Supine to sit: Mod assist (assist for trunk elevation and facilitation of BLE movement) Sit to supine: Min assist (assist to return BLEs back into bed) General bed mobility comments: HOH assist for placement of LUE 2/2 inattention     Transfers  Overall transfer level: Needs assistance Equipment used: Rolling walker (2 wheels) Transfers: Sit to/from Stand Sit to Stand: Mod assist Bed to/from chair/wheelchair/BSC transfer type:: Step pivot Stand pivot transfers: Mod assist Step pivot transfers: Mod assist General transfer comment: STS from EOB, HOH assist for placement of LUE 2/2 inattention    Ambulation / Gait / Stairs / Wheelchair Mobility  Ambulation/Gait Ambulation/Gait assistance: Mod assist Gait Distance (Feet): 5 Feet Assistive device: Rolling walker (2 wheels) Gait Pattern/deviations: Step-to pattern, Decreased step length - right, Decreased stance time - left General Gait Details: took lateral steps towards HOB. Complaining of pain in L knee with weightbearing. Required assist for balance, RW management, and at times L foot advancement due to pain. Gait velocity: decreased    Posture / Balance Dynamic Sitting Balance Sitting balance - Comments: supervision static sitting EOB Balance Overall balance assessment: Needs assistance Sitting-balance  support: Feet supported, Bilateral upper extremity supported Sitting balance-Leahy Scale: Good Sitting balance - Comments: supervision static sitting EOB Postural control: Right lateral lean Standing balance support: Bilateral upper extremity supported, Reliant on assistive device for balance Standing balance-Leahy Scale: Poor Standing balance comment: pt reliant on external support    Special needs/care consideration Diabetic management yes and Behavioral consideration dementia   Previous Home Environment (from acute therapy documentation) Living Arrangements: Spouse/significant other  Lives With: Spouse Available Help at Discharge: Family, Available 24 hours/day Type of Home: House Home Layout: One level Home Access: Stairs to enter Entrance Stairs-Rails: Can reach both, Left, Right Entrance Stairs-Number of Steps: 3 Bathroom Shower/Tub: Clinical cytogeneticist: Standard Bathroom Accessibility: Yes How Accessible: Accessible via walker, Accessible via wheelchair Home Care Services: No  Discharge Living Setting Plans for Discharge Living Setting: Patient's home, Lives with (comment) (wife) Type of Home at Discharge: House Discharge Home Layout: One level Discharge Home Access: Stairs to enter Entrance Stairs-Rails: Can reach both Entrance Stairs-Number of Steps: 3 Discharge Bathroom Shower/Tub: Walk-in shower Discharge Bathroom Toilet: Standard Discharge Bathroom Accessibility: Yes How Accessible: Accessible via walker Does the patient have any problems obtaining your medications?: No  Social/Family/Support Systems Patient Roles: Spouse Anticipated Caregiver: wife, Deneise Lever Anticipated Ambulance person Information: [phone] Ability/Limitations of Caregiver: n/a Caregiver Availability: 24/7 Discharge Plan Discussed with Primary Caregiver: Yes Is Caregiver In Agreement with Plan?: Yes Does Caregiver/Family have Issues with Lodging/Transportation while Pt is in Rehab?: No  Goals Patient/Family Goal for Rehab: PT/OT supervision to min assist; SLP supervision Expected length of stay: 10-14 days Additional Information: 12/6-15 with [place] Pt/Family Agrees to Admission and willing to participate: Yes Program Orientation Provided & Reviewed with Pt/Caregiver Including Roles  & Responsibilities: Yes  Barriers to Discharge: Insurance for SNF coverage  Decrease burden of Care through IP rehab admission: n/a  Possible need for SNF placement upon discharge: Not anticipated.  Pt with recent CIR stay and return home with wife providing assist.   Patient Condition: I have reviewed medical records from [facility], spoken with CM, and spouse. I discussed via phone for inpatient rehabilitation assessment.  Patient will benefit from ongoing PT, OT, and SLP, can actively participate in 3 hours of therapy a day 5 days of the week,  and can make measurable gains during the admission.  Patient will also benefit from the coordinated team approach during an Inpatient Acute Rehabilitation admission.  The patient will receive intensive therapy as well as Rehabilitation physician, nursing, social worker, and care management interventions.  Due to bladder management, bowel management, safety, skin/wound care, disease management, medication administration, pain management, and patient education the patient requires 24 hour a day rehabilitation nursing.  The patient is currently mod assist +2 with mobility and basic ADLs.  Discharge setting and therapy post discharge at home with home health is anticipated.  Patient has agreed to participate in the Acute Inpatient Rehabilitation Program and will admit today.  Preadmission Screen Completed By:  Michel Santee, PT, DPT 08/22/2022 10:32 AM ______________________________________________________________________   Discussed status with Dr. Dagoberto Ligas on 08/22/22 at 10:32 AM and received approval for admission today.  Admission Coordinator:  Michel Santee, PT, DPT time 10:32 AM/Date 08/22/22    Assessment/Plan: Diagnosis: Does the need for close, 24 hr/day Medical supervision in concert with the patient's rehab needs make it unreasonable for this patient to be served in a less intensive setting? Yes Co-Morbidities requiring supervision/potential complications: R pontine expanding infarct- L hemiparesis; B/L cerebellar CVAs/prior 1  month ago; DM, CKD3B; dementia Due to bladder management, bowel management, safety, skin/wound care, disease management, medication administration, pain management, and patient education, does the patient require 24 hr/day rehab nursing? Yes Does the patient require coordinated care of a physician, rehab nurse, PT, OT, and SLP to address physical and functional deficits in the context of the above medical diagnosis(es)? Yes Addressing deficits in the following areas:  balance, endurance, locomotion, strength, transferring, bowel/bladder control, bathing, dressing, feeding, grooming, toileting, cognition, speech, and language Can the patient actively participate in an intensive therapy program of at least 3 hrs of therapy 5 days a week? Yes The potential for patient to make measurable gains while on inpatient rehab is fair Anticipated functional outcomes upon discharge from inpatient rehab: supervision and min assist PT, supervision and min assist OT, supervision and min assist SLP Estimated rehab length of stay to reach the above functional goals is: 10-14 days Anticipated discharge destination: Home 10. Overall Rehab/Functional Prognosis: fair   MD Signature:

## 2022-08-21 DIAGNOSIS — R531 Weakness: Secondary | ICD-10-CM | POA: Diagnosis not present

## 2022-08-21 DIAGNOSIS — Z8673 Personal history of transient ischemic attack (TIA), and cerebral infarction without residual deficits: Secondary | ICD-10-CM | POA: Diagnosis not present

## 2022-08-21 LAB — GLUCOSE, CAPILLARY
Glucose-Capillary: 111 mg/dL — ABNORMAL HIGH (ref 70–99)
Glucose-Capillary: 294 mg/dL — ABNORMAL HIGH (ref 70–99)
Glucose-Capillary: 230 mg/dL — ABNORMAL HIGH (ref 70–99)
Glucose-Capillary: 176 mg/dL — ABNORMAL HIGH (ref 70–99)

## 2022-08-21 LAB — CREATININE, SERUM
Creatinine, Ser: 1.58 mg/dL — ABNORMAL HIGH (ref 0.61–1.24)
GFR, Estimated: 47 mL/min — ABNORMAL LOW (ref >60)

## 2022-08-21 NOTE — H&P (Incomplete)
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  Per chart review patient lives with spouse.  1 level home 3 steps to entry.  Presented 08/14/2022 to [facility] hospital with acute onset of left-sided weakness as well as hypotension.  CT/MRI showed slight interval expansion of the previously seen right pontine infarction, slightly increased in size compared to previous with further posterior extension towards the floor of the fourth ventricle.  No associated hemorrhage or mass effect.  Previously seen scattered small cerebellar infarcts have largely resolved.  No other acute intracranial abnormality.  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  Superimposed significant bilateral supraclinoid ICA atherosclerotic stenosis also redemonstrated and stable.  Recent echocardiogram with ejection fraction of 60 to 65% no wall motion abnormalities.  Admission chemistries unremarkable except potassium 5.4 BUN 57 creatinine 2.75.  Neurology follow-up patient currently remains on aspirin and Brilinta as prior to admission.  Lovenox added for DVT prophylaxis.  AKI on CKD stage III elevated creatinine felt to be related to hypotension received IV fluids with latest creatinine 1.58.  He is tolerating a regular consistency diet.  Therapy evaluations completed due to patient decreased functional mobility left-sided  weakness was admitted for a comprehensive rehab program.  Review of Systems  Constitutional:  Negative for chills and fever.  HENT:  Negative for hearing loss.   Eyes:  Negative for blurred vision and double vision.  Respiratory:  Negative for cough, shortness of breath and wheezing.   Cardiovascular:  Negative for chest pain, palpitations and leg swelling.  Gastrointestinal:  Positive for constipation. Negative for heartburn, nausea and vomiting.  Genitourinary:  Positive for urgency. Negative for dysuria, flank pain and hematuria.  Musculoskeletal:  Positive for joint pain and myalgias.  Skin:  Negative for rash.  Neurological:  Positive for weakness.  Psychiatric/Behavioral:  Positive for memory loss.   All other systems reviewed and are negative.  Past Medical History:  Diagnosis Date   CVA (cerebral infarction)    Diabetes mellitus without complication ([place])    Gout    Hyperlipidemia    Hypertension    Memory loss    Stroke [facility])    Wears dentures    partial upper   Past Surgical History:  Procedure Laterality Date   APPENDECTOMY     CATARACT EXTRACTION W/PHACO Left 10/25/2019   Procedure: CATARACT EXTRACTION PHACO AND INTRAOCULAR LENS PLACEMENT (IOC) LEFT DIABETIC INTRAVEITREAL KENALOG INJECTION 1.93  00:22.6;  Surgeon: Eulogio Bear, MD;  Location: [place];  Service: Ophthalmology;  Laterality: Left;  Diabetic - oral meds   CATARACT EXTRACTION W/PHACO Right 11/15/2019   Procedure: CATARACT EXTRACTION PHACO AND INTRAOCULAR LENS PLACEMENT ([place]) RIGHT DIABETIC;  Surgeon: Eulogio Bear, MD;  Location: [place];  Service: Ophthalmology;  Laterality: Right;  0.92 0 ;19.7   IR ANGIO INTRA EXTRACRAN SEL COM CAROTID INNOMINATE BILAT MOD SED  07/16/2022   IR ANGIO VERTEBRAL SEL SUBCLAVIAN INNOMINATE UNI R MOD SED  07/16/2022   IR ANGIO VERTEBRAL SEL  VERTEBRAL UNI L MOD SED  07/16/2022   IR ANGIO VERTEBRAL SEL VERTEBRAL UNI L MOD SED  07/22/2022   IR  CT HEAD LTD  07/18/2022   IR PTA INTRACRANIAL  07/18/2022   RADIOLOGY WITH ANESTHESIA N/A 07/18/2022   Procedure: Cerebral angioplasty with possible stenting;  Surgeon: Julieanne Cotton, MD;  Location: [facility] OR;  Service: Radiology;  Laterality: N/A;   Family History  Problem Relation Age of Onset   Diabetes Mother    Social History:  reports that he has quit smoking. He has never used smokeless tobacco. He reports that he does not currently use alcohol. He reports that he does not use drugs. Allergies: No Known Allergies Medications Prior to Admission  Medication Sig Dispense Refill   acetaminophen (TYLENOL) 325 MG tablet Take 2 tablets (650 mg total) by mouth every 4 (four) hours as needed for mild pain (or temp > 37.5 C (99.5 F)).     amLODipine (NORVASC) 2.5 MG tablet Take 1 tablet (2.5 mg total) by mouth daily. 30 tablet 0   aspirin EC 81 MG tablet Take 81 mg by mouth daily.     atorvastatin (LIPITOR) 80 MG tablet Take 1 tablet (80 mg total) by mouth daily. 30 tablet 0   cyanocobalamin (VITAMIN B12) 1000 MCG tablet Take 1 tablet (1,000 mcg total) by mouth daily. 30 tablet 0   diclofenac Sodium (VOLTAREN) 1 % GEL Apply 2 g topically 4 (four) times daily. 2 g 0   donepezil (ARICEPT) 10 MG tablet Take 10 mg by mouth at bedtime.     glipiZIDE (GLUCOTROL XL) 2.5 MG 24 hr tablet Take 1 tablet (2.5 mg total) by mouth daily before breakfast. 30 tablet 0   memantine (NAMENDA) 10 MG tablet Take 10 mg by mouth 2 (two) times daily.     ticagrelor (BRILINTA) 90 MG TABS tablet Take 1 tablet (90 mg total) by mouth 2 (two) times daily. 60 tablet 0   [DISCONTINUED] lisinopril (ZESTRIL) 40 MG tablet Take 1 tablet (40 mg total) by mouth daily. 30 tablet 0   clopidogrel (PLAVIX) 75 MG tablet Take 75 mg by mouth daily. (Patient not taking: Reported on 08/17/2022)        Home: Home Living Family/patient expects to be discharged to:: Private residence Living Arrangements: Spouse/significant  other Available Help at Discharge: Family, Available 24 hours/day Type of Home: House Home Access: Stairs to enter [employer] of Steps: 3 Entrance Stairs-Rails: Can reach both, Left, Right Home Layout: One level Bathroom Shower/Tub: Health visitor: Standard Bathroom Accessibility: Yes Home Equipment: Information systems manager, Medical laboratory scientific officer - single point, Agricultural consultant (2 wheels), BSC/3in1  Lives With: Spouse   Functional History: Prior Function Prior Level of Function : Independent/Modified Independent, Needs assist Mobility Comments: pt wife report pt amb with RW at a supervision level, was recieving HH therapy after discharge from [facility] ADLs Comments: MIN A for ADLs, prior to initial stroke pt was performing ADLs with supervision-MOD I; assist for IADLs from wife  Functional Status:  Mobility: Bed Mobility Overal bed mobility: Needs Assistance Bed Mobility: Supine to Sit, Sit to Supine Rolling: Supervision Sidelying to sit: HOB elevated, Min assist Supine to sit: Mod assist Sit to supine: Mod assist General bed mobility comments: assist for trunk elevation and BLE management in/out of bed. Cues for sequencing Transfers Overall transfer level: Needs assistance Equipment used: Rolling walker (2 wheels) Transfers: Sit to/from Stand Sit to Stand: Mod assist Bed to/from chair/wheelchair/BSC transfer type:: Stand pivot Stand pivot  transfers: Mod assist Step pivot transfers: Mod assist General transfer comment: cues for hand placement. ModA to stand from EOB Ambulation/Gait Ambulation/Gait assistance: Mod assist Gait Distance (Feet): 5 Feet Assistive device: Rolling walker (2 wheels) Gait Pattern/deviations: Step-to pattern, Decreased step length - right, Decreased stance time - left General Gait Details: took lateral steps towards HOB. Complaining of pain in L knee with weightbearing. Required assist for balance, RW management, and at times L foot advancement due to  pain. Gait velocity: decreased    ADL: ADL Overall ADL's : Needs assistance/impaired Eating/Feeding: Set up, Sitting Lower Body Dressing: Maximal assistance, Sit to/from stand Lower Body Dressing Details (indicate cue type and reason): brief Toileting- Clothing Manipulation and Hygiene: Maximal assistance, Sit to/from stand General ADL Comments: MAX A for peri care following BM, MAX A for doffing/donning socks in seated, MOD-MAX A for UB dressing using hemi technique, MOD A for SPT bedside chair<>bsc  Cognition: Cognition Overall Cognitive Status: History of cognitive impairments - at baseline Arousal/Alertness: Awake/alert Orientation Level: Oriented to person, Disoriented to place, Disoriented to time, Disoriented to situation Cognition Arousal/Alertness: Awake/alert Behavior During Therapy: Flat affect Overall Cognitive Status: History of cognitive impairments - at baseline Area of Impairment: Attention, Memory, Awareness, Following commands, Safety/judgement, Problem solving Orientation Level: Disoriented to, Time, Situation Current Attention Level: Focused Memory: Decreased short-term memory Following Commands: Follows one step commands with increased time Safety/Judgement: Decreased awareness of deficits Awareness: Intellectual Problem Solving: Slow processing, Decreased initiation, Requires verbal cues, Requires tactile cues, Difficulty sequencing General Comments: Diffiuclty with attention on left side of body  Physical Exam: Blood pressure (!) 149/68, pulse 67, temperature (!) 97.5 F (36.4 C), resp. rate 17, height 5\' 9"  (1.753 m), weight 75.6 kg, SpO2 100 %. Physical Exam Neurological:     Comments: Patient is alert makes eye contact with examiner.  Follows simple commands.  Oriented to state but not city.  He can provide the month but not appropriate year.     Results for orders placed or performed during the hospital encounter of 08/14/22 (from the past 48  hour(s))  Glucose, capillary     Status: Abnormal   Collection Time: 08/19/22  9:32 AM  Result Value Ref Range   Glucose-Capillary 125 (H) 70 - 99 mg/dL    Comment: Glucose reference range applies only to samples taken after fasting for at least 8 hours.  Glucose, capillary     Status: Abnormal   Collection Time: 08/19/22 11:08 AM  Result Value Ref Range   Glucose-Capillary 126 (H) 70 - 99 mg/dL    Comment: Glucose reference range applies only to samples taken after fasting for at least 8 hours.  Glucose, capillary     Status: Abnormal   Collection Time: 08/19/22  1:20 PM  Result Value Ref Range   Glucose-Capillary 149 (H) 70 - 99 mg/dL    Comment: Glucose reference range applies only to samples taken after fasting for at least 8 hours.  Glucose, capillary     Status: Abnormal   Collection Time: 08/19/22  3:10 PM  Result Value Ref Range   Glucose-Capillary 184 (H) 70 - 99 mg/dL    Comment: Glucose reference range applies only to samples taken after fasting for at least 8 hours.  Glucose, capillary     Status: Abnormal   Collection Time: 08/19/22  5:11 PM  Result Value Ref Range   Glucose-Capillary 181 (H) 70 - 99 mg/dL    Comment: Glucose reference range applies only to samples taken  after fasting for at least 8 hours.  Glucose, capillary     Status: Abnormal   Collection Time: 08/19/22  9:51 PM  Result Value Ref Range   Glucose-Capillary 123 (H) 70 - 99 mg/dL    Comment: Glucose reference range applies only to samples taken after fasting for at least 8 hours.  Glucose, capillary     Status: Abnormal   Collection Time: 08/20/22  8:45 AM  Result Value Ref Range   Glucose-Capillary 131 (H) 70 - 99 mg/dL    Comment: Glucose reference range applies only to samples taken after fasting for at least 8 hours.  Glucose, capillary     Status: Abnormal   Collection Time: 08/20/22 11:24 AM  Result Value Ref Range   Glucose-Capillary 227 (H) 70 - 99 mg/dL    Comment: Glucose reference  range applies only to samples taken after fasting for at least 8 hours.  Glucose, capillary     Status: Abnormal   Collection Time: 08/20/22  5:30 PM  Result Value Ref Range   Glucose-Capillary 184 (H) 70 - 99 mg/dL    Comment: Glucose reference range applies only to samples taken after fasting for at least 8 hours.  Glucose, capillary     Status: Abnormal   Collection Time: 08/20/22  9:12 PM  Result Value Ref Range   Glucose-Capillary 106 (H) 70 - 99 mg/dL    Comment: Glucose reference range applies only to samples taken after fasting for at least 8 hours.   Comment 1 Notify RN    No results found.    Blood pressure (!) 149/68, pulse 67, temperature (!) 97.5 F (36.4 C), resp. rate 17, height 5\' 9"  (1.753 m), weight 75.6 kg, SpO2 100 %.  Medical Problem List and Plan: 1. 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  MRI 12/27 slight interval expansion of previously an acute right pontine infarction  -patient may *** shower  -ELOS/Goals: *** 2.  Antithrombotics: -DVT/anticoagulation:  Pharmaceutical: Lovenox  -antiplatelet therapy: Aspirin 81 mg daily and Brilinta 90 mg twice daily 3. Pain Management: Tylenol as needed 4. Mood/Behavior/Sleep: Aricept 10 mg nightly, Namenda 10 mg twice daily  -antipsychotic agents: N/A 5. Neuropsych/cognition: This patient is not capable of making decisions on his own behalf. 6. Skin/Wound Care: Routine skin checks 7. Fluids/Electrolytes/Nutrition: Routine in and outs with follow-up chemistries 8.  Hypertension.  Norvasc 2.5 mg daily, lisinopril 5 mg daily.  Monitor with increased mobility 9.  Hyperlipidemia.  Lipitor 10.  AKI on CKD stage III.  AKI felt to be related to hypotension.  Follow-up chemistries.  Baseline creatinine 1.38-1.86 11.  Diabetes mellitus.   Hemoglobin A1c 6.5.  Resume glipizide 2.5 mg daily  ***  Cathlyn Parsons, PA-C 08/21/2022

## 2022-08-21 NOTE — Progress Notes (Signed)
Occupational Therapy Treatment Patient Details Name: Mitchell Washington MRN: [id number] DOB: [birth year] Today's Date: 08/21/2022   History of present illness 72 y/o male presented to ED on 08/14/22 for L sided weakness and difficulty walking. MRI showed slight expansion of previous R pontine infarct. Recent admission to [facility] 12/7-12/14 for R CVA. 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 Pt required Min-Mod A for bed mobility this date. He engaged in seated grooming tasks. Pt was then instructed in EOB activities to address L sided inattention including bimanual activities, dynamic reaching activities, crossing midline, and OT providing stimulus in L visual field to facilitate visual tracking to affected side. Education provided on visual compensatory strategies of environmental scanning and the clock method for improved spatial awareness, safety with mobility, increased independence with self-feeding, and to address L sided inattention. Pt was then able to stand and take lateral steps at EOB before returning to supine. Pt left as received with all needs in reach. Pt is making progress toward goal completion. D/C recommendation remains appropriate. OT will continue to follow acutely.     Recommendations for follow up therapy are one component of a multi-disciplinary discharge planning process, led by the attending physician.  Recommendations may be updated based on patient status, additional functional criteria and insurance authorization.    Follow Up Recommendations  Acute inpatient rehab (3hours/day)     Assistance Recommended at Discharge Frequent or constant Supervision/Assistance  Patient can return home with the following  Assistance with cooking/housework;Direct supervision/assist for financial management;Assist for transportation;Help  with stairs or ramp for entrance;Direct supervision/assist for medications management;A lot of help with bathing/dressing/bathroom;A lot of help with walking and/or transfers   Equipment Recommendations  Other (comment) (defer to next venue of care)    Recommendations for Other Services      Precautions / Restrictions Precautions Precautions: Fall Precaution Comments: L hemiparesis, dementia Restrictions Weight Bearing Restrictions: No       Mobility Bed Mobility Overal bed mobility: Needs Assistance Bed Mobility: Supine to Sit, Sit to Supine     Supine to sit: Mod assist (assist for trunk elevation and facilitation of BLE movement) Sit to supine: Min assist (assist to return BLEs back into bed)   General bed mobility comments: HOH assist for placement of LUE 2/2 inattention    Transfers Overall transfer level: Needs assistance Equipment used: Rolling walker (2 wheels) Transfers: Sit to/from Stand Sit to Stand: Mod assist           General transfer comment: STS from EOB, HOH assist for placement of LUE 2/2 inattention     Balance Overall balance assessment: Needs assistance Sitting-balance support: Feet supported, Bilateral upper extremity supported Sitting balance-Leahy Scale: Good     Standing balance support: Bilateral upper extremity supported, Reliant on assistive device for balance Standing balance-Leahy Scale: Poor                             ADL either performed or assessed with clinical judgement   ADL Overall ADL's : Needs assistance/impaired     Grooming: Cueing for sequencing;Set up;Sitting;Wash/dry face                               Functional mobility during ADLs: Minimal assistance;Rolling  walker (2 wheels);Cueing for sequencing;Cueing for safety;Moderate assistance (to take ~4 lateral steps at EOB, assist for RW management)      Extremity/Trunk Assessment Upper Extremity Assessment Upper Extremity Assessment:  Generalized weakness   Lower Extremity Assessment Lower Extremity Assessment: Generalized weakness   Cervical / Trunk Assessment Cervical / Trunk Assessment: Kyphotic    Vision Baseline Vision/History: 1 Wears glasses     Perception Perception Perception: Impaired Inattention/Neglect: Does not attend to left side of body   Praxis Praxis Praxis: Impaired Praxis Impairment Details: Motor planning;Perseveration    Cognition Arousal/Alertness: Awake/alert Behavior During Therapy: Flat affect Overall Cognitive Status: History of cognitive impairments - at baseline Area of Impairment: Attention, Memory, Awareness, Following commands, Safety/judgement, Problem solving                 Orientation Level: Disoriented to, Time, Situation, Place Current Attention Level: Focused Memory: Decreased short-term memory Following Commands: Follows one step commands with increased time Safety/Judgement: Decreased awareness of deficits, Decreased awareness of safety Awareness: Intellectual Problem Solving: Slow processing, Decreased initiation, Requires verbal cues, Requires tactile cues, Difficulty sequencing General Comments: Oriented to self only, repetition required for single step commands        Exercises Other Exercises Other Exercises: Education provided on visual compensatory strategies of environmental scanning and the clock method for improved spatial awareness, safety with mobility, increased independence with self-feeding, and to address L sided inattention.    Shoulder Instructions       General Comments      Pertinent Vitals/ Pain       Pain Assessment Pain Assessment: Faces Faces Pain Scale: Hurts little more Pain Location: L knee with weightbearing Pain Descriptors / Indicators: Discomfort, Grimacing, Guarding Pain Intervention(s): Monitored during session, Repositioned  Home Living                                          Prior  Functioning/Environment              Frequency  Min 3X/week        Progress Toward Goals  OT Goals(current goals can now be found in the care plan section)  Progress towards OT goals: Progressing toward goals  Acute Rehab OT Goals Patient Stated Goal: improve function OT Goal Formulation: With patient/family Time For Goal Achievement: 08/29/22 Potential to Achieve Goals: Good  Plan Discharge plan remains appropriate;Frequency remains appropriate    Co-evaluation                 AM-PAC OT "6 Clicks" Daily Activity     Outcome Measure   Help from another person eating meals?: A Little Help from another person taking care of personal grooming?: A Little Help from another person toileting, which includes using toliet, bedpan, or urinal?: A Lot Help from another person bathing (including washing, rinsing, drying)?: A Lot Help from another person to put on and taking off regular upper body clothing?: A Lot Help from another person to put on and taking off regular lower body clothing?: A Lot 6 Click Score: 14    End of Session Equipment Utilized During Treatment: Gait belt;Rolling walker (2 wheels)  OT Visit Diagnosis: Unsteadiness on feet (R26.81);Other abnormalities of gait and mobility (R26.89);Muscle weakness (generalized) (M62.81);Hemiplegia and hemiparesis Hemiplegia - Right/Left: Left Hemiplegia - dominant/non-dominant: Non-Dominant Hemiplegia - caused by: Cerebral infarction   Activity Tolerance Patient tolerated treatment well;Patient limited  by pain   Patient Left in bed;with call bell/phone within reach;with bed alarm set   Nurse Communication Mobility status        Time: 2330-0762 OT Time Calculation (min): 18 min  Charges: OT General Charges $OT Visit: 1 Visit OT Treatments $Self Care/Home Management : 8-22 mins  [facility] MS, OTR/L ascom [phone]  08/21/22, 5:39 PM

## 2022-08-21 NOTE — Progress Notes (Addendum)
Inpatient Rehab Admissions Coordinator:   Awaiting determination from [facility] regarding [facility] prior auth request.  Will continue to follow and update once we hear from insurance.   Addendum: I received auth from [facility].  Will plan potential admit tomorrow pending stability and bed availability.   Shann Medal, PT, DPT Admissions Coordinator [phone] 08/21/22  10:31 AM

## 2022-08-21 NOTE — Progress Notes (Signed)
Progress Note    Mitchell Washington  XTK:[id number] DOB: [birth year]  DOA: 08/14/2022 PCP: Derinda Late, MD      Brief Narrative:    Medical records reviewed and are as summarized below:  Mitchell Washington is a 72 y.o. male with past medical history of diabetes mellitus type 2, CKD 3B, hypertension, dementia and previous CVA most recently 1 month prior with multiple bilateral cerebellar infarcts status post balloon angioplasty for posterior circulation occlusion who presented to the emergency room on 12/27 with left-sided weakness.  In the emergency room, noted to be borderline hypotensive with a systolic blood pressure of 97 and AKI with creatinine 2.75 and GFR of 24.  Teleneurology initial evaluation with concerns for new CVA.  CT scan of head unremarkable.  Patient brought in for further evaluation.  MRI revealed no evidence of acute new CVA, but did note slight interval expansion of previously seen right pontine infarct with further posterior extension towards floor of fourth ventricle.      Assessment/Plan:   Principal Problem:   Left-sided weakness Active Problems:   History of multiple strokes   Acute renal failure superimposed on stage 3b chronic kidney disease (HCC)   Dementia without behavioral disturbance (HCC)   Primary hypertension   Type 2 diabetes mellitus with peripheral neuropathy (HCC)   Pure hypercholesterolemia   AKI (acute kidney injury) ([place])    Body mass index is 24.61 kg/m.    Left-sided weakness suspected to be due to extension of her recent stroke 1 month prior to admission: MRI brain showed extension of old CVA.  Continue atorvastatin, aspirin and Brilinta.  Awaiting placement to acute inpatient rehab for further rehabilitation. 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  History of multiple strokes, bilateral cerebellar infarcts on 07/16/2022 s/p balloon angioplasty for posterior circulation  occlusion  AKI on CKD stage IIIb: Improved.  Left knee pain: Chart review shows patient has osteoarthritis of the left knee.  Outpatient follow-up with PCP.  Dementia: Continue donepezil and memantine.  Other comorbidities include type II DM with peripheral neuropathy, hypertension  Diet Order             Diet Heart Room service appropriate? Yes; Fluid consistency: Thin  Diet effective now                            Consultants: Neurologist  Procedures: S/p four-vessel cerebral arteriogram on 07/16/2022 S/p left vertebral arteriogram on 07/18/2022    Medications:    amLODipine  2.5 mg Oral Daily   aspirin EC  81 mg Oral Daily   atorvastatin  80 mg Oral Daily   cyanocobalamin  1,000 mcg Oral Daily   donepezil  10 mg Oral QHS   enoxaparin (LOVENOX) injection  40 mg Subcutaneous Q24H   feeding supplement  237 mL Oral BID BM   insulin aspart  0-15 Units Subcutaneous TID WC   insulin aspart  0-5 Units Subcutaneous QHS   memantine  10 mg Oral BID   ticagrelor  90 mg Oral BID   Continuous Infusions:   Anti-infectives (From admission, onward)    None              Family Communication/Anticipated D/C date and plan/Code Status   DVT prophylaxis: enoxaparin (LOVENOX) injection 40 mg Start: 08/17/22 0800     Code Status: DNR  Family Communication: Plan discussed with his sisters at the bedside. Disposition  Plan: Plan to discharge to acute inpatient rehab   Status is: Inpatient Remains inpatient appropriate because: Awaiting placement       Subjective:   Interval events noted.  He complains of pain in the left knee.  His sisters were at the bedside.  Objective:    Vitals:   08/20/22 0851 08/20/22 1610 08/21/22 0352 08/21/22 0833  BP: 136/72 (!) 140/64 (!) 149/68 (!) 149/70  Pulse: 70 86 67 70  Resp:   17   Temp: 97.8 F (36.6 C) 98.8 F (37.1 C) (!) 97.5 F (36.4 C) 97.9 F (36.6 C)  TempSrc:      SpO2: 100% 99% 100% 100%   Weight:      Height:       No data found.   Intake/Output Summary (Last 24 hours) at 08/21/2022 1609 Last data filed at 08/21/2022 1500 Gross per 24 hour  Intake 914 ml  Output 700 ml  Net 214 ml   Filed Weights   08/14/22 1457  Weight: 75.6 kg    Exam:  GEN: NAD SKIN: Warm and dry EYES: No pallor or icterus ENT: MMM CV: RRR PULM: CTA B ABD: soft, ND, NT, +BS CNS: AAO x 3, left facial droop, mild left hemiparesis EXT: Mild left knee tenderness without swelling or erythema        Data Reviewed:   I have personally reviewed following labs and imaging studies:  Labs: Labs show the following:   Basic Metabolic Panel: Recent Labs  Lab 08/14/22 1622 08/16/22 0539 08/17/22 0856 08/21/22 0429  NA 139 142  --   --   K 5.4* 4.9  --   --   CL 110 112*  --   --   CO2 20* 21*  --   --   GLUCOSE 131* 107*  --   --   BUN 57* 41*  --   --   CREATININE 2.75* 1.61* 1.35* 1.58*  CALCIUM 8.9 9.1  --   --    GFR Estimated Creatinine Clearance: 43.5 mL/min (A) (by C-G formula based on SCr of 1.58 mg/dL (H)). Liver Function Tests: Recent Labs  Lab 08/14/22 1622  AST 83*  ALT 189*  ALKPHOS 77  BILITOT 0.5  PROT 7.8  ALBUMIN 3.8   No results for input(s): "LIPASE", "AMYLASE" in the last 168 hours. No results for input(s): "AMMONIA" in the last 168 hours. Coagulation profile No results for input(s): "INR", "PROTIME" in the last 168 hours.  CBC: Recent Labs  Lab 08/16/22 0539  WBC 6.2  HGB 11.8*  HCT 37.2*  MCV 83.4  PLT 261   Cardiac Enzymes: No results for input(s): "CKTOTAL", "CKMB", "CKMBINDEX", "TROPONINI" in the last 168 hours. BNP (last 3 results) No results for input(s): "PROBNP" in the last 8760 hours. CBG: Recent Labs  Lab 08/20/22 1124 08/20/22 1730 08/20/22 2112 08/21/22 0731 08/21/22 1209  GLUCAP 227* 184* 106* 111* 294*   D-Dimer: No results for input(s): "DDIMER" in the last 72 hours. Hgb A1c: No results for input(s): "HGBA1C"  in the last 72 hours. Lipid Profile: No results for input(s): "CHOL", "HDL", "LDLCALC", "TRIG", "CHOLHDL", "LDLDIRECT" in the last 72 hours. Thyroid function studies: No results for input(s): "TSH", "T4TOTAL", "T3FREE", "THYROIDAB" in the last 72 hours.  Invalid input(s): "FREET3" Anemia work up: No results for input(s): "VITAMINB12", "FOLATE", "FERRITIN", "TIBC", "IRON", "RETICCTPCT" in the last 72 hours. Sepsis Labs: Recent Labs  Lab 08/16/22 0539  WBC 6.2    Microbiology Recent Results (from  the past 240 hour(s))  Resp panel by RT-PCR (RSV, Flu A&B, Covid) Anterior Nasal Swab     Status: None   Collection Time: 08/14/22  7:28 PM   Specimen: Anterior Nasal Swab  Result Value Ref Range Status   SARS Coronavirus 2 by RT PCR NEGATIVE NEGATIVE Final    Comment: (NOTE) SARS-CoV-2 target nucleic acids are NOT DETECTED.  The SARS-CoV-2 RNA is generally detectable in upper respiratory specimens during the acute phase of infection. The lowest concentration of SARS-CoV-2 viral copies this assay can detect is 138 copies/mL. A negative result does not preclude SARS-Cov-2 infection and should not be used as the sole basis for treatment or other patient management decisions. A negative result may occur with  improper specimen collection/handling, submission of specimen other than nasopharyngeal swab, presence of viral mutation(s) within the areas targeted by this assay, and inadequate number of viral copies(<138 copies/mL). A negative result must be combined with clinical observations, patient history, and epidemiological information. The expected result is Negative.  Fact Sheet for Patients:  BloggerCourse.com  Fact Sheet for Healthcare Providers:  SeriousBroker.it  This test is no t yet approved or cleared by the [place] FDA and  has been authorized for detection and/or diagnosis of SARS-CoV-2 by FDA under an Emergency Use  Authorization (EUA). This EUA will remain  in effect (meaning this test can be used) for the duration of the COVID-19 declaration under Section 564(b)(1) of the Act, 21 U.S.C.section 360bbb-3(b)(1), unless the authorization is terminated  or revoked sooner.       Influenza A by PCR NEGATIVE NEGATIVE Final   Influenza B by PCR NEGATIVE NEGATIVE Final    Comment: (NOTE) The Xpert Xpress SARS-CoV-2/FLU/RSV plus assay is intended as an aid in the diagnosis of influenza from Nasopharyngeal swab specimens and should not be used as a sole basis for treatment. Nasal washings and aspirates are unacceptable for Xpert Xpress SARS-CoV-2/FLU/RSV testing.  Fact Sheet for Patients: BloggerCourse.com  Fact Sheet for Healthcare Providers: SeriousBroker.it  This test is not yet approved or cleared by the [place] FDA and has been authorized for detection and/or diagnosis of SARS-CoV-2 by FDA under an Emergency Use Authorization (EUA). This EUA will remain in effect (meaning this test can be used) for the duration of the COVID-19 declaration under Section 564(b)(1) of the Act, 21 U.S.C. section 360bbb-3(b)(1), unless the authorization is terminated or revoked.     Resp Syncytial Virus by PCR NEGATIVE NEGATIVE Final    Comment: (NOTE) Fact Sheet for Patients: BloggerCourse.com  Fact Sheet for Healthcare Providers: SeriousBroker.it  This test is not yet approved or cleared by the [place] FDA and has been authorized for detection and/or diagnosis of SARS-CoV-2 by FDA under an Emergency Use Authorization (EUA). This EUA will remain in effect (meaning this test can be used) for the duration of the COVID-19 declaration under Section 564(b)(1) of the Act, 21 U.S.C. section 360bbb-3(b)(1), unless the authorization is terminated or revoked.  Performed at [facility], [address] [place], [place] [address]     Procedures and diagnostic studies:  No results found.             LOS: 7 days   Janeil Schexnayder  [facility]   Pager on [email or web address]. If 7PM-7AM, please contact night-coverage at [email or web address]     08/21/2022, 4:09 PM

## 2022-08-21 NOTE — Progress Notes (Signed)
Physical Therapy Treatment Patient Details Name: Mitchell Washington MRN: [id number] DOB: 1950-12-17 Today's Date: 08/21/2022   History of Present Illness 72 y/o male presented to ED on 08/14/22 for L sided weakness and difficulty walking. MRI showed slight expansion of previous R pontine infarct. Recent admission to [facility] 12/7-12/14 for R CVA. PMH: CVA s/p balloon angioplasty for posterior circulation occlusion on 07/16/22, T2DM, Gout, HLD , HTN ,CKD, dementia    PT Comments    Patient supine in bed on arrival and reluctant to participate. Provided encouragement with patient eventually agreeing to therapy session. Required minA for bed mobility and modA to stand from bed surface initially. With subsequent trials, able to stand with minA x 3. Patient able to make needs known and assisted with setting up with lunch tray. Continue to recommend acute inpatient rehab ([facility]) for post-acute therapy needs.     Recommendations for follow up therapy are one component of a multi-disciplinary discharge planning process, led by the attending physician.  Recommendations may be updated based on patient status, additional functional criteria and insurance authorization.  Follow Up Recommendations  Acute inpatient rehab (3hours/day)     Assistance Recommended at Discharge Frequent or constant Supervision/Assistance  Patient can return home with the following A lot of help with walking and/or transfers;A lot of help with bathing/dressing/bathroom   Equipment Recommendations  None recommended by PT    Recommendations for Other Services       Precautions / Restrictions Precautions Precautions: Fall Precaution Comments: L hemiparesis, dementia Restrictions Weight Bearing Restrictions: No     Mobility  Bed Mobility Overal bed mobility: Needs Assistance Bed Mobility: Supine to Sit, Sit to Supine     Supine to sit: Min assist Sit to supine: Min assist   General bed mobility comments: assist for trunk  elevation to EOB and to return LEs back into bed    Transfers Overall transfer level: Needs assistance Equipment used: Rolling Gwynn Crossley (2 wheels) Transfers: Sit to/from Stand Sit to Stand: Mod assist, Min assist   Step pivot transfers: Mod assist       General transfer comment: cues for hand placement. Initially requiring modA to stand but with subsequent stands able to stand with minA. Took sidesteps towards [facility] due to patient continuing to complain of L knee pain    Ambulation/Gait                   Stairs             Wheelchair Mobility    Modified Rankin (Stroke Patients Only)       Balance Overall balance assessment: Needs assistance Sitting-balance support: Feet supported Sitting balance-Leahy Scale: Good     Standing balance support: Bilateral upper extremity supported, Reliant on assistive device for balance Standing balance-Leahy Scale: Poor                              Cognition Arousal/Alertness: Awake/alert Behavior During Therapy: Flat affect Overall Cognitive Status: History of cognitive impairments - at baseline                                 General Comments: requires encouragement to participate        Exercises General Exercises - Lower Extremity Long Arc Quad: Right, 10 reps, Left, 5 reps, Seated Other Exercises Other Exercises: sit to stand x 3  General Comments        Pertinent Vitals/Pain Pain Assessment Pain Assessment: Faces Faces Pain Scale: Hurts little more Pain Location: L knee with weightbearing Pain Descriptors / Indicators: Discomfort, Grimacing, Guarding Pain Intervention(s): Monitored during session, Repositioned    Home Living                          Prior Function            PT Goals (current goals can now be found in the care plan section) Acute Rehab PT Goals Patient Stated Goal: did not state PT Goal Formulation: Patient unable to participate in goal  setting Time For Goal Achievement: 08/29/22 Potential to Achieve Goals: Fair Progress towards PT goals: Progressing toward goals    Frequency    7X/week      PT Plan Current plan remains appropriate    Co-evaluation              AM-PAC PT "6 Clicks" Mobility   Outcome Measure  Help needed turning from your back to your side while in a flat bed without using bedrails?: A Little Help needed moving from lying on your back to sitting on the side of a flat bed without using bedrails?: A Lot Help needed moving to and from a bed to a chair (including a wheelchair)?: A Lot Help needed standing up from a chair using your arms (e.g., wheelchair or bedside chair)?: A Lot Help needed to walk in hospital room?: A Lot Help needed climbing 3-5 steps with a railing? : Total 6 Click Score: 12    End of Session Equipment Utilized During Treatment: Gait belt Activity Tolerance: Patient limited by pain Patient left: in bed;with call bell/phone within reach;with bed alarm set Nurse Communication: Mobility status PT Visit Diagnosis: Other symptoms and signs involving the nervous system (R29.898);Muscle weakness (generalized) (M62.81);Other abnormalities of gait and mobility (R26.89);Unsteadiness on feet (R26.81);Difficulty in walking, not elsewhere classified (R26.2)     Time: 1350-1416 PT Time Calculation (min) (ACUTE ONLY): 26 min  Charges:  $Therapeutic Exercise: 8-22 mins $Therapeutic Activity: 8-22 mins                     Stacee Earp A. Gilford Rile PT, DPT Mitchell Washington 08/21/2022, 2:26 PM

## 2022-08-22 ENCOUNTER — Encounter (HOSPITAL_COMMUNITY): Payer: Self-pay | Admitting: Physical Medicine and Rehabilitation

## 2022-08-22 ENCOUNTER — Other Ambulatory Visit: Payer: Self-pay

## 2022-08-22 ENCOUNTER — Inpatient Hospital Stay (HOSPITAL_COMMUNITY)
Admission: RE | Admit: 2022-08-22 | Discharge: 2022-09-03 | DRG: 057 | Disposition: A | Discharge status: Home Health | Payer: Medicare HMO | Source: Other Acute Inpatient Hospital | Attending: Physical Medicine and Rehabilitation | Admitting: Physical Medicine and Rehabilitation

## 2022-08-22 DIAGNOSIS — Z7982 Long term (current) use of aspirin: Secondary | ICD-10-CM | POA: Diagnosis not present

## 2022-08-22 DIAGNOSIS — Z87891 Personal history of nicotine dependence: Secondary | ICD-10-CM | POA: Diagnosis not present

## 2022-08-22 DIAGNOSIS — R413 Other amnesia: Secondary | ICD-10-CM | POA: Diagnosis present

## 2022-08-22 DIAGNOSIS — Z7984 Long term (current) use of oral hypoglycemic drugs: Secondary | ICD-10-CM

## 2022-08-22 DIAGNOSIS — I69398 Other sequelae of cerebral infarction: Principal | ICD-10-CM

## 2022-08-22 DIAGNOSIS — I959 Hypotension, unspecified: Secondary | ICD-10-CM | POA: Diagnosis present

## 2022-08-22 DIAGNOSIS — R4589 Other symptoms and signs involving emotional state: Secondary | ICD-10-CM | POA: Diagnosis not present

## 2022-08-22 DIAGNOSIS — Z79899 Other long term (current) drug therapy: Secondary | ICD-10-CM

## 2022-08-22 DIAGNOSIS — F0390 Unspecified dementia without behavioral disturbance: Secondary | ICD-10-CM | POA: Diagnosis not present

## 2022-08-22 DIAGNOSIS — M6281 Muscle weakness (generalized): Secondary | ICD-10-CM | POA: Diagnosis present

## 2022-08-22 DIAGNOSIS — E1122 Type 2 diabetes mellitus with diabetic chronic kidney disease: Secondary | ICD-10-CM | POA: Diagnosis present

## 2022-08-22 DIAGNOSIS — M109 Gout, unspecified: Secondary | ICD-10-CM | POA: Diagnosis present

## 2022-08-22 DIAGNOSIS — M25552 Pain in left hip: Secondary | ICD-10-CM | POA: Diagnosis present

## 2022-08-22 DIAGNOSIS — Z8673 Personal history of transient ischemic attack (TIA), and cerebral infarction without residual deficits: Secondary | ICD-10-CM

## 2022-08-22 DIAGNOSIS — I129 Hypertensive chronic kidney disease with stage 1 through stage 4 chronic kidney disease, or unspecified chronic kidney disease: Secondary | ICD-10-CM | POA: Diagnosis present

## 2022-08-22 DIAGNOSIS — Z7401 Bed confinement status: Secondary | ICD-10-CM | POA: Diagnosis not present

## 2022-08-22 DIAGNOSIS — I6350 Cerebral infarction due to unspecified occlusion or stenosis of unspecified cerebral artery: Secondary | ICD-10-CM | POA: Diagnosis present

## 2022-08-22 DIAGNOSIS — E559 Vitamin D deficiency, unspecified: Secondary | ICD-10-CM | POA: Diagnosis present

## 2022-08-22 DIAGNOSIS — R531 Weakness: Secondary | ICD-10-CM | POA: Diagnosis not present

## 2022-08-22 DIAGNOSIS — N179 Acute kidney failure, unspecified: Secondary | ICD-10-CM | POA: Diagnosis not present

## 2022-08-22 DIAGNOSIS — N183 Chronic kidney disease, stage 3 unspecified: Secondary | ICD-10-CM | POA: Diagnosis present

## 2022-08-22 DIAGNOSIS — K5900 Constipation, unspecified: Secondary | ICD-10-CM | POA: Diagnosis present

## 2022-08-22 DIAGNOSIS — E785 Hyperlipidemia, unspecified: Secondary | ICD-10-CM | POA: Diagnosis not present

## 2022-08-22 DIAGNOSIS — I10 Essential (primary) hypertension: Secondary | ICD-10-CM | POA: Diagnosis not present

## 2022-08-22 DIAGNOSIS — E1169 Type 2 diabetes mellitus with other specified complication: Secondary | ICD-10-CM | POA: Diagnosis not present

## 2022-08-22 DIAGNOSIS — Z515 Encounter for palliative care: Secondary | ICD-10-CM | POA: Diagnosis not present

## 2022-08-22 DIAGNOSIS — R2689 Other abnormalities of gait and mobility: Secondary | ICD-10-CM | POA: Diagnosis present

## 2022-08-22 DIAGNOSIS — Z833 Family history of diabetes mellitus: Secondary | ICD-10-CM | POA: Diagnosis not present

## 2022-08-22 DIAGNOSIS — Z66 Do not resuscitate: Secondary | ICD-10-CM

## 2022-08-22 DIAGNOSIS — R001 Bradycardia, unspecified: Secondary | ICD-10-CM | POA: Diagnosis present

## 2022-08-22 LAB — CBC
HCT: 36.4 % — ABNORMAL LOW (ref 39.0–52.0)
Hemoglobin: 11.7 g/dL — ABNORMAL LOW (ref 13.0–17.0)
MCH: 26.9 pg (ref 26.0–34.0)
MCHC: 32.1 g/dL (ref 30.0–36.0)
MCV: 83.7 fL (ref 80.0–100.0)
Platelets: 350 10*3/uL (ref 150–400)
RBC: 4.35 MIL/uL (ref 4.22–5.81)
RDW: 14 % (ref 11.5–15.5)
WBC: 6.3 10*3/uL (ref 4.0–10.5)
nRBC: 0 % (ref 0.0–0.2)

## 2022-08-22 LAB — GLUCOSE, CAPILLARY
Glucose-Capillary: 170 mg/dL — ABNORMAL HIGH (ref 70–99)
Glucose-Capillary: 231 mg/dL — ABNORMAL HIGH (ref 70–99)
Glucose-Capillary: 165 mg/dL — ABNORMAL HIGH (ref 70–99)
Glucose-Capillary: 117 mg/dL — ABNORMAL HIGH (ref 70–99)

## 2022-08-22 LAB — CREATININE, SERUM
Creatinine, Ser: 1.68 mg/dL — ABNORMAL HIGH (ref 0.61–1.24)
GFR, Estimated: 43 mL/min — ABNORMAL LOW (ref >60)

## 2022-08-22 MED ORDER — ENOXAPARIN SODIUM 40 MG/0.4ML IJ SOSY
40.0000 mg | PREFILLED_SYRINGE | INTRAMUSCULAR | Status: DC
  Administered 2022-08-23 – 2022-09-02 (×11): 40 mg via SUBCUTANEOUS
  Filled 2022-08-22 (×11): qty 0.4

## 2022-08-22 MED ORDER — ASPIRIN 81 MG PO TBEC
81.0000 mg | DELAYED_RELEASE_TABLET | Freq: Every day | ORAL | Status: DC
  Administered 2022-08-23 – 2022-09-03 (×12): 81 mg via ORAL
  Filled 2022-08-22 (×12): qty 1

## 2022-08-22 MED ORDER — ACETAMINOPHEN 650 MG RE SUPP: 650.0000 mg | RECTAL | Status: DC | PRN

## 2022-08-22 MED ORDER — MEMANTINE HCL 10 MG PO TABS
10.0000 mg | ORAL_TABLET | Freq: Two times a day (BID) | ORAL | Status: DC
  Administered 2022-08-22 – 2022-09-03 (×24): 10 mg via ORAL
  Filled 2022-08-22 (×24): qty 1

## 2022-08-22 MED ORDER — AMLODIPINE BESYLATE 2.5 MG PO TABS
2.5000 mg | ORAL_TABLET | Freq: Every day | ORAL | Status: DC
  Administered 2022-08-23 – 2022-08-25 (×3): 2.5 mg via ORAL
  Filled 2022-08-22 (×3): qty 1

## 2022-08-22 MED ORDER — ACETAMINOPHEN 325 MG PO TABS
650.0000 mg | ORAL_TABLET | ORAL | Status: DC | PRN
  Administered 2022-08-24 (×2): 650 mg via ORAL
  Filled 2022-08-22 (×2): qty 2

## 2022-08-22 MED ORDER — ACETAMINOPHEN 160 MG/5ML PO SOLN: 650.0000 mg | ORAL | Status: DC | PRN

## 2022-08-22 MED ORDER — VITAMIN B-12 1000 MCG PO TABS
1000.0000 ug | ORAL_TABLET | Freq: Every day | ORAL | Status: DC
  Administered 2022-08-23 – 2022-08-28 (×6): 1000 ug via ORAL
  Filled 2022-08-22 (×6): qty 1

## 2022-08-22 MED ORDER — ENOXAPARIN SODIUM 40 MG/0.4ML IJ SOSY: 40.0000 mg | PREFILLED_SYRINGE | INTRAMUSCULAR | Status: DC

## 2022-08-22 MED ORDER — ATORVASTATIN CALCIUM 80 MG PO TABS
80.0000 mg | ORAL_TABLET | Freq: Every day | ORAL | Status: DC
  Administered 2022-08-23 – 2022-09-03 (×12): 80 mg via ORAL
  Filled 2022-08-22 (×12): qty 1

## 2022-08-22 MED ORDER — DONEPEZIL HCL 10 MG PO TABS
10.0000 mg | ORAL_TABLET | Freq: Every day | ORAL | Status: DC
  Administered 2022-08-22 – 2022-09-02 (×12): 10 mg via ORAL
  Filled 2022-08-22 (×12): qty 1

## 2022-08-22 MED ORDER — TICAGRELOR 90 MG PO TABS
90.0000 mg | ORAL_TABLET | Freq: Two times a day (BID) | ORAL | Status: DC
  Administered 2022-08-22 – 2022-09-03 (×24): 90 mg via ORAL
  Filled 2022-08-22 (×25): qty 1

## 2022-08-22 MED ORDER — ENSURE ENLIVE PO LIQD
237.0000 mL | Freq: Two times a day (BID) | ORAL | Status: DC
  Administered 2022-08-23 – 2022-08-26 (×5): 237 mL via ORAL

## 2022-08-22 MED ORDER — INSULIN ASPART 100 UNIT/ML IJ SOLN
0.0000 [IU] | Freq: Three times a day (TID) | INTRAMUSCULAR | Status: DC
  Administered 2022-08-22: 3 [IU] via SUBCUTANEOUS
  Administered 2022-08-23: 2 [IU] via SUBCUTANEOUS
  Administered 2022-08-23 – 2022-08-24 (×2): 3 [IU] via SUBCUTANEOUS
  Administered 2022-08-25 (×2): 2 [IU] via SUBCUTANEOUS
  Administered 2022-08-25: 3 [IU] via SUBCUTANEOUS
  Administered 2022-08-26: 5 [IU] via SUBCUTANEOUS
  Administered 2022-08-26: 3 [IU] via SUBCUTANEOUS
  Administered 2022-08-27: 5 [IU] via SUBCUTANEOUS
  Administered 2022-08-28: 2 [IU] via SUBCUTANEOUS
  Administered 2022-08-28: 5 [IU] via SUBCUTANEOUS
  Administered 2022-08-29 – 2022-09-01 (×3): 2 [IU] via SUBCUTANEOUS

## 2022-08-22 MED ORDER — LISINOPRIL 5 MG PO TABS
5.0000 mg | ORAL_TABLET | Freq: Every day | ORAL | Status: DC
  Administered 2022-08-23 – 2022-08-28 (×6): 5 mg via ORAL
  Filled 2022-08-22 (×6): qty 1

## 2022-08-22 MED ORDER — GLIPIZIDE 5 MG PO TABS
2.5000 mg | ORAL_TABLET | Freq: Every day | ORAL | Status: DC
  Administered 2022-08-23: 2.5 mg via ORAL
  Filled 2022-08-22 (×2): qty 1
  Filled 2022-08-22: qty 0.5
  Filled 2022-08-22 (×4): qty 1

## 2022-08-22 NOTE — Progress Notes (Incomplete)
Reported to receiving nurse at [facility].

## 2022-08-22 NOTE — Plan of Care (Signed)
  Problem: Coping: Goal: Will verbalize positive feelings about self Outcome: Progressing   Problem: Education: Goal: Knowledge of disease or condition will improve Outcome: Progressing   Problem: Nutrition: Goal: Risk of aspiration will decrease Outcome: Progressing   Problem: Education: Goal: Ability to describe self-care measures that may prevent or decrease complications (Diabetes Survival Skills Education) will improve Outcome: Progressing   Problem: Skin Integrity: Goal: Risk for impaired skin integrity will decrease Outcome: Progressing   Problem: Activity: Goal: Risk for activity intolerance will decrease Outcome: Progressing

## 2022-08-22 NOTE — Discharge Summary (Addendum)
Physician Discharge Summary   Patient: Mitchell Washington MRN: [id number] DOB: 02-11-1951  Admit date:     08/14/2022  Discharge date: 08/22/22  Discharge Physician: Lurene Shadow   PCP: Kandyce Rud, MD   Recommendations at discharge:   Follow-up with PCP in 2 weeks  Discharge Diagnoses: Principal Problem:   Left-sided weakness Active Problems:   History of multiple strokes   Acute renal failure superimposed on stage 3b chronic kidney disease (HCC)   Dementia without behavioral disturbance (HCC)   Primary hypertension   Type 2 diabetes mellitus with peripheral neuropathy (HCC)   Pure hypercholesterolemia   AKI (acute kidney injury) (HCC)  Resolved Problems:   * No resolved hospital problems. [facility] Course:  Mitchell Washington is a 72 y.o. male with past medical history of diabetes mellitus type 2, CKD 3B, hypertension, dementia and previous CVA most recently 1 month prior with multiple bilateral cerebellar infarcts status post balloon angioplasty for posterior circulation occlusion who presented to the emergency room on 12/27 with left-sided weakness.  In the emergency room, noted to be borderline hypotensive with a systolic blood pressure of 97 and AKI with creatinine 2.75 and GFR of 24.  Teleneurology initial evaluation with concerns for new CVA.  CT scan of head unremarkable.  Patient brought in for further evaluation.  MRI revealed no evidence of acute new CVA, but did note slight interval expansion of previously seen right pontine infarct with further posterior extension towards floor of fourth ventricle.    Neurologist was consulted to assist with management.  She recommended treatment with aspirin and Brilinta.  He had AKI which improved with IV fluids.  He was evaluated by PT and OT recommended further rehabilitation of the acute inpatient rehab facility.  Discharge plan was discussed with his brother at the bedside. EMTALA has been completed.   Assessment and  Plan:  Left-sided weakness suspected to be due to extension of her recent stroke 1 month prior to admission: MRI brain showed extension of old CVA.  Continue atorvastatin, aspirin and Brilinta. 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 He is stable for discharge to acute inpatient rehab at [facility].   History of multiple strokes, bilateral cerebellar infarcts on 07/16/2022 s/p balloon angioplasty for posterior circulation occlusion   AKI on CKD stage IIIb: Improved.   Left knee pain: Chart review shows patient has osteoarthritis of the left knee.  Tylenol as needed for pain.  Outpatient follow-up with PCP.   Dementia: Continue donepezil and memantine.   Other comorbidities include type II DM with peripheral neuropathy, hypertension         Consultants: Neurologist Procedures performed:  S/p four-vessel cerebral arteriogram on 07/16/2022 S/p left vertebral arteriogram on 07/18/2022  Disposition: Rehabilitation facility Diet recommendation:  Discharge Diet Orders (From admission, onward)     Start     Ordered   08/22/22 0000  Diet - low sodium heart healthy        08/22/22 1034   08/22/22 0000  Diet Carb Modified        08/22/22 1034           Cardiac and Carb modified diet DISCHARGE MEDICATION: Allergies as of 08/22/2022   No Known Allergies      Medication List     STOP taking these medications    clopidogrel 75 MG tablet Commonly known as: PLAVIX       TAKE these medications    acetaminophen 325  MG tablet Commonly known as: TYLENOL Take 2 tablets (650 mg total) by mouth every 4 (four) hours as needed for mild pain (or temp > 37.5 C (99.5 F)).   amLODipine 2.5 MG tablet Commonly known as: NORVASC Take 1 tablet (2.5 mg total) by mouth daily.   aspirin EC 81 MG tablet Take 81 mg by mouth daily.   atorvastatin 80 MG tablet Commonly known as: LIPITOR Take 1 tablet (80 mg total) by mouth daily.    Brilinta 90 MG Tabs tablet Generic drug: ticagrelor Take 1 tablet (90 mg total) by mouth 2 (two) times daily.   cyanocobalamin 1000 MCG tablet Commonly known as: VITAMIN B12 Take 1 tablet (1,000 mcg total) by mouth daily.   diclofenac Sodium 1 % Gel Commonly known as: VOLTAREN Apply 2 g topically 4 (four) times daily.   donepezil 10 MG tablet Commonly known as: ARICEPT Take 10 mg by mouth at bedtime.   feeding supplement Liqd Take 237 mLs by mouth 2 (two) times daily between meals.   glipiZIDE 2.5 MG 24 hr tablet Commonly known as: GLUCOTROL XL Take 1 tablet (2.5 mg total) by mouth daily before breakfast.   lisinopril 5 MG tablet Commonly known as: ZESTRIL Take 1 tablet (5 mg total) by mouth daily. What changed:  medication strength how much to take   memantine 10 MG tablet Commonly known as: NAMENDA Take 10 mg by mouth 2 (two) times daily.        Follow-up Information     Derinda Late, MD. Schedule an appointment as soon as possible for a visit in 2 week(s).   Specialty: Family Medicine Contact information: [address] [facility] [place] [address] [phone]                Discharge Exam: Danley Danker Weights   08/14/22 1457  Weight: 75.6 kg   GEN: NAD SKIN: No rash EYES: EOMI ENT: MMM CV: RRR PULM: CTA B ABD: soft, ND, NT, +BS CNS: AAO x 2 (person and place), non focal EXT: No edema or tenderness   Condition at discharge: good  The results of significant diagnostics from this hospitalization (including imaging, microbiology, ancillary and laboratory) are listed below for reference.   Imaging Studies: CT ANGIO HEAD W OR WO CONTRAST  Result Date: 08/17/2022 CLINICAL DATA:  72 year old male status post bilateral cerebellar infarcts and brainstem about a month ago. Bilateral PCA occlusions. Status post endovascular treatment of high-grade Basilar artery and vertebrobasilar stenoses. EXAM: CT  ANGIOGRAPHY HEAD TECHNIQUE: Multidetector CT imaging of the head was performed using the standard protocol during bolus administration of intravenous contrast. Multiplanar CT image reconstructions and MIPs were obtained to evaluate the vascular anatomy. RADIATION DOSE REDUCTION: This exam was performed according to the departmental dose-optimization program which includes automated exposure control, adjustment of the mA and/or kV according to patient size and/or use of iterative reconstruction technique. CONTRAST:  32mL OMNIPAQUE IOHEXOL 350 MG/ML SOLN COMPARISON:  Head CT 08/14/2022. Brain MRI yesterday.  Post treatment intracranial MRA 07/19/2022. FINDINGS: CT HEAD Brain: Stable non contrast CT appearance of the brain. No acute intracranial hemorrhage identified. No midline shift, mass effect, or evidence of intracranial mass lesion. Chronic cerebellar, brainstem, left greater than right PCA, and left MCA territory infarcts. Calvarium and skull base: No acute osseous abnormality identified. Paranasal sinuses: Stable paranasal sinuses. Tympanic cavities and mastoids remain clear. Orbits: No acute orbit or scalp soft tissue finding. CTA HEAD Posterior circulation: Dominant  left vertebral V4 segment. Severe distal vertebral artery atherosclerosis and tandem stenoses. Functionally occluded distal right vertebral at the vertebrobasilar junction, unchanged. Enhancement of the distal left vertebral artery is mildly improved compared to 07/15/2022. Left PICA origin remains patent. Severe proximal basilar artery stenosis persists (series 11 image 132 and series 13, image 22 today. This appears slightly improved since prior CTA. Basilar tip and SCA origins remain patent. Highly irregular PCA origins are patent, with improved bilateral PCA enhancement especially on the right. Left PCA P3 branches remain attenuated. Posterior communicating arteries are diminutive or absent. Anterior circulation: Both ICA siphons are patent.  On the right moderate calcified plaque, and moderate to severe short segment supraclinoid stenosis (series 16, image 97) appears stable. Right ICA terminus remains patent. On the left similar bulky supraclinoid plaque and at least moderate supraclinoid stenosis (series 16, image 115) appears stable. Left ICA terminus remains patent. MCA and ACA origins remain patent. Severe left A1 stenosis appears stable. Anterior communicating artery and bilateral ACA branches remain within normal limits. Left MCA M1 segment is patent with mild irregularity at the bifurcation. Left MCA branches are stable and within normal limits. Right MCA M1 segment bifurcates early without stenosis. Right MCA branches are stable with mild posterior M2 segment irregularity. Venous sinuses: Early contrast timing, superior sagittal sinus is patent. Anatomic variants: Dominant left vertebral artery. Review of the MIP images confirms the above findings IMPRESSION: 1. 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 Mildly improved appearance of the pronounced distal Left Vertebral Artery and mid Basilar artery stenoses since 07/15/2022, but hemodynamically significant narrowing remains. Mildly improved appearance also bilateral PCA enhancement, distal left PCA branches remain occluded. 2. Superimposed significant bilateral supraclinoid ICA atherosclerotic stenoses also re-demonstrated and stable. Anterior circulation remains patent. Stable moderate to severe Left A1 stenosis. 3. Stable CT appearance of the brain, no new intracranial abnormality. Electronically Signed   By: Genevie Ann M.D.   On: 08/17/2022 12:40   MR BRAIN WO CONTRAST  Result Date: 08/14/2022 CLINICAL DATA:  Initial evaluation for neuro deficit, stroke suspected. Recent hospitalization for stroke, status post posterior circulation angioplasty. EXAM: MRI HEAD WITHOUT CONTRAST TECHNIQUE: Multiplanar, multiecho pulse sequences of the brain and surrounding structures  were obtained without intravenous contrast. COMPARISON:  Prior CT from earlier the same day as well as recent MRI from 12/17/2021. FINDINGS: Brain: Age-related cerebral atrophy. Patchy and confluent T2/FLAIR hyperintensity involving the periventricular deep white matter both cerebral hemispheres, consistent with chronic small vessel ischemic disease. Patchy involvement of the pons. Remote hemorrhagic lacunar infarct noted at the left basal ganglia. Remote bilateral PCA territory infarcts, left larger than right noted. Few scattered remote bilateral cerebellar infarcts. Previously seen small scattered cerebellar infarcts have largely resolved, with only faint residual punctate diffusion signal abnormality at the left cerebellum (series 5, image 9). There has been interval expansion of the previously a seen right pontine infarct, slightly increased in size as compared to previous exam, with further posterior extension towards the floor of the fourth ventricle (series 5, image 15). Degree of new/interval infarction best appreciated on ADC map (series 6, image 15). No associated hemorrhage or mass effect. No other evidence for new or interval infarct elsewhere. No acute intracranial hemorrhage. No mass lesion, midline shift or mass effect. Mild ex vacuo dilatation of the left lateral ventricle related to the chronic left basal ganglia infarct. No hydrocephalus. No extra-axial fluid collection. Pituitary gland and suprasellar region within normal limits. Vascular: Residual mildly irregular flow void within the  proximal basilar artery, presumably related to known vertebrobasilar insufficiency (series 10, image 8). Major intracranial vascular flow voids are otherwise maintained. Skull and upper cervical spine: Craniocervical junction within normal limits. Bone marrow signal intensity heterogeneous but overall within normal limits. No scalp soft tissue abnormality. Sinuses/Orbits: Prior bilateral ocular lens replacement.  Scattered mucosal thickening noted about the sphenoethmoidal and maxillary sinuses. Small right maxillary sinus retention cyst. No mastoid effusion. Other: None. IMPRESSION: 1. Slight interval expansion of the previously seen right pontine infarct, slightly increased in size as compared to previous, with further posterior extension towards the floor of the fourth ventricle. No associated hemorrhage or mass effect. 2. Previously seen scattered small cerebellar infarcts have largely resolved, with only faint residual diffusion signal abnormality at the left cerebellum. 3. No other new acute intracranial abnormality. 4. Underlying atrophy with chronic microvascular ischemic disease, with additional remote infarcts involving the left basal ganglia, bilateral PCA territories, and bilateral cerebellar hemispheres. Electronically Signed   By: Rise Mu M.D.   On: 08/14/2022 22:47   DG Chest Portable 1 View  Result Date: 08/14/2022 CLINICAL DATA:  Weakness EXAM: PORTABLE CHEST 1 VIEW COMPARISON:  07/15/2022 FINDINGS: Heart and mediastinal contours are within normal limits. No focal opacities or effusions. No acute bony abnormality. Aortic atherosclerosis. IMPRESSION: No active cardiopulmonary disease. Electronically Signed   By: Charlett Nose M.D.   On: 08/14/2022 20:03   CT HEAD WO CONTRAST ( )  Result Date: 08/14/2022 CLINICAL DATA:  TIA EXAM: CT HEAD WITHOUT CONTRAST TECHNIQUE: Contiguous axial images were obtained from the base of the skull through the vertex without intravenous contrast. RADIATION DOSE REDUCTION: This exam was performed according to the departmental dose-optimization program which includes automated exposure control, adjustment of the mA and/or kV according to patient size and/or use of iterative reconstruction technique. COMPARISON:  None Available. FINDINGS: Brain: Old left occipital infarct. Old left basal ganglia lacunar infarct. There is atrophy and chronic small vessel  disease changes. No acute intracranial abnormality. Specifically, no hemorrhage, hydrocephalus, mass lesion, acute infarction, or significant intracranial injury. Vascular: No hyperdense vessel or unexpected calcification. Skull: No acute calvarial abnormality. Sinuses/Orbits: No acute findings Other: None IMPRESSION: Old left infarct as above. Atrophy, chronic microvascular disease. No acute intracranial abnormality. Electronically Signed   By: Charlett Nose M.D.   On: 08/14/2022 15:29   DG Knee Left Port  Result Date: 07/24/2022 CLINICAL DATA:  Pain.  No injury. EXAM: PORTABLE LEFT KNEE - 1-2 VIEW COMPARISON:  None Available. FINDINGS: No fracture. No dislocation. There are bidirectional enthesopathic changes of the patella. Vascular calcifications are present. No radiopaque foreign body. No effusion. IMPRESSION: Patellofemoral compartment predominant degenerative change. No fracture or dislocation. Electronically Signed   By: Lorenza Cambridge M.D.   On: 07/24/2022 10:33    Microbiology: Results for orders placed or performed during the hospital encounter of 08/14/22  Resp panel by RT-PCR (RSV, Flu A&B, Covid) Anterior Nasal Swab     Status: None   Collection Time: 08/14/22  7:28 PM   Specimen: Anterior Nasal Swab  Result Value Ref Range Status   SARS Coronavirus 2 by RT PCR NEGATIVE NEGATIVE Final    Comment: (NOTE) SARS-CoV-2 target nucleic acids are NOT DETECTED.  The SARS-CoV-2 RNA is generally detectable in upper respiratory specimens during the acute phase of infection. The lowest concentration of SARS-CoV-2 viral copies this assay can detect is 138 copies/mL. A negative result does not preclude SARS-Cov-2 infection and should not be used as the sole basis for  treatment or other patient management decisions. A negative result may occur with  improper specimen collection/handling, submission of specimen other than nasopharyngeal swab, presence of viral mutation(s) within the areas targeted  by this assay, and inadequate number of viral copies(<138 copies/mL). A negative result must be combined with clinical observations, patient history, and epidemiological information. The expected result is Negative.  Fact Sheet for Patients:  BloggerCourse.com  Fact Sheet for Healthcare Providers:  SeriousBroker.it  This test is no t yet approved or cleared by the [place] FDA and  has been authorized for detection and/or diagnosis of SARS-CoV-2 by FDA under an Emergency Use Authorization (EUA). This EUA will remain  in effect (meaning this test can be used) for the duration of the COVID-19 declaration under Section 564(b)(1) of the Act, 21 U.S.C.section 360bbb-3(b)(1), unless the authorization is terminated  or revoked sooner.       Influenza A by PCR NEGATIVE NEGATIVE Final   Influenza B by PCR NEGATIVE NEGATIVE Final    Comment: (NOTE) The Xpert Xpress SARS-CoV-2/FLU/RSV plus assay is intended as an aid in the diagnosis of influenza from Nasopharyngeal swab specimens and should not be used as a sole basis for treatment. Nasal washings and aspirates are unacceptable for Xpert Xpress SARS-CoV-2/FLU/RSV testing.  Fact Sheet for Patients: BloggerCourse.com  Fact Sheet for Healthcare Providers: SeriousBroker.it  This test is not yet approved or cleared by the [place] FDA and has been authorized for detection and/or diagnosis of SARS-CoV-2 by FDA under an Emergency Use Authorization (EUA). This EUA will remain in effect (meaning this test can be used) for the duration of the COVID-19 declaration under Section 564(b)(1) of the Act, 21 U.S.C. section 360bbb-3(b)(1), unless the authorization is terminated or revoked.     Resp Syncytial Virus by PCR NEGATIVE NEGATIVE Final    Comment: (NOTE) Fact Sheet for Patients: BloggerCourse.com  Fact  Sheet for Healthcare Providers: SeriousBroker.it  This test is not yet approved or cleared by the [place] FDA and has been authorized for detection and/or diagnosis of SARS-CoV-2 by FDA under an Emergency Use Authorization (EUA). This EUA will remain in effect (meaning this test can be used) for the duration of the COVID-19 declaration under Section 564(b)(1) of the Act, 21 U.S.C. section 360bbb-3(b)(1), unless the authorization is terminated or revoked.  Performed at [facility], [address] [place], [place] [address]     Labs: CBC: Recent Labs  Lab 08/16/22 0539  WBC 6.2  HGB 11.8*  HCT 37.2*  MCV 83.4  PLT 261   Basic Metabolic Panel: Recent Labs  Lab 08/16/22 0539 08/17/22 0856 08/21/22 0429  NA 142  --   --   K 4.9  --   --   CL 112*  --   --   CO2 21*  --   --   GLUCOSE 107*  --   --   BUN 41*  --   --   CREATININE 1.61* 1.35* 1.58*  CALCIUM 9.1  --   --    Liver Function Tests: No results for input(s): "AST", "ALT", "ALKPHOS", "BILITOT", "PROT", "ALBUMIN" in the last 168 hours. CBG: Recent Labs  Lab 08/21/22 0731 08/21/22 1209 08/21/22 1720 08/21/22 2047 08/22/22 0807  GLUCAP 111* 294* 230* 176* 170*    Discharge time spent: greater than 30 minutes.  Signed: Lurene Shadow, MD [facility] 08/22/2022

## 2022-08-22 NOTE — Progress Notes (Signed)
INPATIENT REHABILITATION ADMISSION NOTE   Arrival Method: PTAR     Mental Orientation: oriented to self and place   Assessment: done   Skin: done   IV'S: none   Pain: bilateral shoulder pain.    Tubes and Drains: none   Safety Measures: reviewed with pt   Vital Signs: done   Height and Weight: done   Rehab Orientation: done    Family: not present at this time    Notes: done  Gerald Stabs, RN

## 2022-08-22 NOTE — Plan of Care (Signed)
  Problem: Health Behavior/Discharge Planning: Goal: Ability to manage health-related needs will improve Outcome: Progressing   Problem: Clinical Measurements: Goal: Ability to maintain clinical measurements within normal limits will improve Outcome: Progressing Goal: Diagnostic test results will improve Outcome: Progressing Goal: Respiratory complications will improve Outcome: Progressing Goal: Cardiovascular complication will be avoided Outcome: Progressing   Problem: Nutrition: Goal: Adequate nutrition will be maintained Outcome: Progressing   Problem: Elimination: Goal: Will not experience complications related to bowel motility Outcome: Progressing Goal: Will not experience complications related to urinary retention Outcome: Progressing   

## 2022-08-22 NOTE — Progress Notes (Signed)
[facility] transported pt from [facility] to [facility].  Pt discharged with his tennis shoes.

## 2022-08-22 NOTE — Progress Notes (Addendum)
PMR Admission Coordinator Pre-Admission Assessment   Patient: Mitchell Washington is an 72 y.o., male MRN: [id number] DOB: 08/08/1951 Height: _0  (175.3 cm) Weight: 75.6 kg   Insurance Information HMO: yes    PPO:      PCP:      IPA:      80/20:      OTHER:  PRIMARY: [employer] Medicare (starting 08/19/22, Acute stay with [facility])      Policy#: [id number]      Subscriber: pt CM Name: Noralyn Pick      Phone#: [phone] (ext not provided)     Fax#: [phone] Pre-Cert#: [id number] [place] for CIR from [place] with [employer] with updates due to Hilbert Odor (ext [phone]) at fax above on 1/10      Employer:  Benefits:  Phone #: [phone]     Name:  Eff. Date: 08/19/22     Deduct: $0      Out of Pocket Max: $7550 ($0 met)      Life Max: n/a CIR: $335/day for days 1-6      SNF: $10/day for days 1-20, then $203/day for days 7-100 Outpatient:      Co-Pay: $25/visit Home Health: 100%      Co-Pay:  DME: 80%     Co-Pay: 20%  Providers:  SECONDARY: n/a      Policy#: n     Phone#:    Financial Counselor:       Phone#:    The "Data Collection Information Summary" for patients in Inpatient Rehabilitation Facilities with attached "Privacy Act [place] Records" was provided and verbally reviewed with: Patient and Family   Emergency Contact Information Contact Information       Name Relation Home Work [address]    [place], [place] [phone]   [phone]           Current Medical History  Patient Admitting Diagnosis: CVA   History of Present Illness: Mitchell Washington is a 72 year old right-handed male with history of diabetes mellitus, CKD stage III with baseline creatinine 1.38-1.86, hypertension, dementia maintained on Aricept as well as Namenda, 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  Presented 08/14/2022 to [facility] hospital with acute onset of left-sided weakness as well as hypotension.  CT/MRI showed slight interval expansion of the previously seen right pontine infarction, slightly increased in size compared to previous with further posterior extension towards the floor of the fourth ventricle.  No associated hemorrhage or mass effect.  Previously seen scattered small cerebellar infarcts have largely resolved.  No other acute intracranial abnormality.  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  Superimposed significant bilateral supraclinoid ICA atherosclerotic stenosis also redemonstrated and stable.  Recent echocardiogram with ejection fraction of 60 to 65% no wall motion abnormalities.  Admission chemistries unremarkable except potassium 5.4 BUN 57 creatinine 2.75.  Neurology follow-up patient currently remains on aspirin and Brilinta as prior to admission.  Lovenox added for DVT prophylaxis.  AKI on CKD stage III elevated creatinine felt to be related to hypotension received IV fluids with latest creatinine 1.58.  He is tolerating a regular consistency diet.  Therapy evaluations completed and pt was recommended for a comprehensive rehab program.     Complete NIHSS TOTAL: 5   Patient's medical record from [facility] has been  reviewed by the rehabilitation admission coordinator and physician.   Past Medical History      Past Medical History:  Diagnosis Date   CVA (cerebral infarction)     Diabetes mellitus without complication ([place])     Gout     Hyperlipidemia     Hypertension     Memory loss     Stroke [facility])     Wears dentures      partial upper      Has the patient had major surgery during 100 days prior to admission? No   Family History   family history includes Diabetes in his mother.   Current Medications   Current Facility-Administered Medications:     acetaminophen (TYLENOL) tablet 650 mg, 650 mg, Oral, Q4H PRN, 650 mg at 08/21/22 1230 **OR** acetaminophen (TYLENOL) 160 MG/5ML solution 650 mg, 650 mg, Per Tube, Q4H PRN **OR** acetaminophen (TYLENOL) suppository 650 mg, 650 mg, Rectal, Q4H PRN, Athena Masse, MD   amLODipine (NORVASC) tablet 2.5 mg, 2.5 mg, Oral, Daily, Posey Pronto, Sona, MD, 2.5 mg at 08/22/22 5170   aspirin EC tablet 81 mg, 81 mg, Oral, Daily, Judd Gaudier V, MD, 81 mg at 08/22/22 0911   atorvastatin (LIPITOR) tablet 80 mg, 80 mg, Oral, Daily, Judd Gaudier V, MD, 80 mg at 08/22/22 0174   cyanocobalamin (VITAMIN B12) tablet 1,000 mcg, 1,000 mcg, Oral, Daily, Fritzi Mandes, MD, 1,000 mcg at 08/22/22 0911   donepezil (ARICEPT) tablet 10 mg, 10 mg, Oral, QHS, Judd Gaudier V, MD, 10 mg at 08/21/22 2140   enoxaparin (LOVENOX) injection 40 mg, 40 mg, Subcutaneous, Q24H, Dorothe Pea, RPH, 40 mg at 08/22/22 0910   feeding supplement (ENSURE ENLIVE / ENSURE PLUS) liquid 237 mL, 237 mL, Oral, BID BM, Fritzi Mandes, MD, 237 mL at 08/22/22 0911   insulin aspart (novoLOG) injection 0-15 Units, 0-15 Units, Subcutaneous, TID WC, Athena Masse, MD, 5 Units at 08/21/22 1731   insulin aspart (novoLOG) injection 0-5 Units, 0-5 Units, Subcutaneous, QHS, Athena Masse, MD   memantine [facility]) tablet 10 mg, 10 mg, Oral, BID, Athena Masse, MD, 10 mg at 08/22/22 9449   Oral care mouth rinse, 15 mL, Mouth Rinse, PRN, Fritzi Mandes, MD   ticagrelor [facility]) tablet 90 mg, 90 mg, Oral, BID, Athena Masse, MD, 90 mg at 08/22/22 0911   Patients Current Diet:  Diet Order                  Diet Heart Room service appropriate? Yes; Fluid consistency: Thin  Diet effective now                         Precautions / Restrictions Precautions Precautions: Fall Precaution Comments: L hemiparesis, dementia Restrictions Weight Bearing Restrictions: No    Has the patient had 2 or more falls or a fall with injury in the past year? Yes   Prior  Activity Level Community (5-7x/wk): independent, dementia   Prior Functional Level Self Care: Did the patient need help bathing, dressing, using the toilet or eating? Needed some help   Indoor Mobility: Did the patient need assistance with walking from room to room (with or without device)? Needed some help   Stairs: Did the patient need assistance with internal or external stairs (with or without device)? Needed some help   Functional Cognition: Did the patient need help planning regular tasks such as shopping or remembering to take medications? Needed some help  Patient Information Are you of Hispanic, Latino/a,or Spanish origin?: A. No, not of Hispanic, Latino/a, or Spanish origin, X. Patient unable to respond (proxy) What is your race?: B. Black or African American, X. Patient unable to respond (proxy) Do you need or want an interpreter to communicate with a doctor or health care staff?: 9. Unable to respond   Patient's Response To:  Health Literacy and Transportation Is the patient able to respond to health literacy and transportation needs?: No Health Literacy - How often do you need to have someone help you when you read instructions, pamphlets, or other written material from your doctor or pharmacy?: Patient unable to respond In the past 12 months, has lack of transportation kept you from medical appointments or from getting medications?: No (proxy) In the past 12 months, has lack of transportation kept you from meetings, work, or from getting things needed for daily living?: No (proxy)   Development worker, international aid / [place] Devices/Equipment: Eyeglasses Home Equipment: Shower seat, Radio producer - single point, Conservation officer, nature (2 wheels), BSC/3in1   Prior Device Use: Indicate devices/aids used by the patient prior to current illness, exacerbation or injury? Walker   Current Functional Level Cognition   Arousal/Alertness: Awake/alert Overall Cognitive Status: History of  cognitive impairments - at baseline Current Attention Level: Focused Orientation Level: Oriented to person, Oriented to place, Disoriented to time, Disoriented to situation Following Commands: Follows one step commands with increased time Safety/Judgement: Decreased awareness of deficits, Decreased awareness of safety General Comments: Oriented to self only, repetition required for single step commands    Extremity Assessment (includes Sensation/Coordination)   Upper Extremity Assessment: Generalized weakness LUE Deficits / Details: flaccid LUE- MAS 1 throughout shoulder, elbow; 1+ in wrist flexion/extension LUE Sensation: decreased light touch LUE Coordination: decreased fine motor, decreased gross motor  Lower Extremity Assessment: Generalized weakness LLE Deficits / Details: grossly ~3/5 LLE Coordination: decreased fine motor     ADLs   Overall ADL's : Needs assistance/impaired Eating/Feeding: Set up, Sitting Grooming: Cueing for sequencing, Set up, Sitting, Wash/dry face Lower Body Dressing: Maximal assistance, Sit to/from stand Lower Body Dressing Details (indicate cue type and reason): brief Toileting- Clothing Manipulation and Hygiene: Maximal assistance, Sit to/from stand Functional mobility during ADLs: Minimal assistance, Rolling walker (2 wheels), Cueing for sequencing, Cueing for safety, Moderate assistance (to take ~4 lateral steps at EOB, assist for RW management) General ADL Comments: MAX A for peri care following BM, MAX A for doffing/donning socks in seated, MOD-MAX A for UB dressing using hemi technique, MOD A for SPT bedside chair<>bsc     Mobility   Overal bed mobility: Needs Assistance Bed Mobility: Supine to Sit, Sit to Supine Rolling: Supervision Sidelying to sit: HOB elevated, Min assist Supine to sit: Mod assist (assist for trunk elevation and facilitation of BLE movement) Sit to supine: Min assist (assist to return BLEs back into bed) General bed mobility  comments: HOH assist for placement of LUE 2/2 inattention     Transfers   Overall transfer level: Needs assistance Equipment used: Rolling walker (2 wheels) Transfers: Sit to/from Stand Sit to Stand: Mod assist Bed to/from chair/wheelchair/BSC transfer type:: Step pivot Stand pivot transfers: Mod assist Step pivot transfers: Mod assist General transfer comment: STS from EOB, HOH assist for placement of LUE 2/2 inattention     Ambulation / Gait / Stairs / Wheelchair Mobility   Ambulation/Gait Ambulation/Gait assistance: Mod assist Gait Distance (Feet): 5 Feet Assistive device: Rolling walker (2 wheels) Gait Pattern/deviations: Step-to  pattern, Decreased step length - right, Decreased stance time - left General Gait Details: took lateral steps towards HOB. Complaining of pain in L knee with weightbearing. Required assist for balance, RW management, and at times L foot advancement due to pain. Gait velocity: decreased     Posture / Balance Dynamic Sitting Balance Sitting balance - Comments: supervision static sitting EOB Balance Overall balance assessment: Needs assistance Sitting-balance support: Feet supported, Bilateral upper extremity supported Sitting balance-Leahy Scale: Good Sitting balance - Comments: supervision static sitting EOB Postural control: Right lateral lean Standing balance support: Bilateral upper extremity supported, Reliant on assistive device for balance Standing balance-Leahy Scale: Poor Standing balance comment: pt reliant on external support     Special needs/care consideration Diabetic management yes and Behavioral consideration dementia    Previous Home Environment (from acute therapy documentation) Living Arrangements: Spouse/significant other  Lives With: Spouse Available Help at Discharge: Family, Available 24 hours/day Type of Home: House Home Layout: One level Home Access: Stairs to enter Entrance Stairs-Rails: Can reach both, Left,  Right Entrance Stairs-Number of Steps: 3 Bathroom Shower/Tub: Multimedia programmer: Standard Bathroom Accessibility: Yes How Accessible: Accessible via walker, Accessible via wheelchair Home Care Services: No   Discharge Living Setting Plans for Discharge Living Setting: Patient's home, Lives with (comment) (wife) Type of Home at Discharge: House Discharge Home Layout: One level Discharge Home Access: Stairs to enter Entrance Stairs-Rails: Can reach both Entrance Stairs-Number of Steps: 3 Discharge Bathroom Shower/Tub: Walk-in shower Discharge Bathroom Toilet: Standard Discharge Bathroom Accessibility: Yes How Accessible: Accessible via walker Does the patient have any problems obtaining your medications?: No   Social/Family/Support Systems Patient Roles: Spouse Anticipated Caregiver: wife, Deneise Lever Anticipated Ambulance person Information: [phone] Ability/Limitations of Caregiver: n/a Caregiver Availability: 24/7 Discharge Plan Discussed with Primary Caregiver: Yes Is Caregiver In Agreement with Plan?: Yes Does Caregiver/Family have Issues with Lodging/Transportation while Pt is in Rehab?: No   Goals Patient/Family Goal for Rehab: PT/OT supervision to min assist; SLP supervision Expected length of stay: 10-14 days Additional Information: 12/6-15 with [place] Pt/Family Agrees to Admission and willing to participate: Yes Program Orientation Provided & Reviewed with Pt/Caregiver Including Roles  & Responsibilities: Yes  Barriers to Discharge: Insurance for SNF coverage   Decrease burden of Care through IP rehab admission: n/a   Possible need for SNF placement upon discharge: Not anticipated.  Pt with recent CIR stay and return home with wife providing assist.    Patient Condition: I have reviewed medical records from [facility], spoken with CM, and spouse. I discussed via phone for inpatient rehabilitation assessment.  Patient will benefit from ongoing PT, OT, and  SLP, can actively participate in 3 hours of therapy a day 5 days of the week, and can make measurable gains during the admission.  Patient will also benefit from the coordinated team approach during an Inpatient Acute Rehabilitation admission.  The patient will receive intensive therapy as well as Rehabilitation physician, nursing, social worker, and care management interventions.  Due to bladder management, bowel management, safety, skin/wound care, disease management, medication administration, pain management, and patient education the patient requires 24 hour a day rehabilitation nursing.  The patient is currently mod assist +2 with mobility and basic ADLs.  Discharge setting and therapy post discharge at home with home health is anticipated.  Patient has agreed to participate in the Acute Inpatient Rehabilitation Program and will admit today.   Preadmission Screen Completed By:  Michel Santee, PT, DPT 08/22/2022 10:32 AM ______________________________________________________________________  Discussed status with Dr. Dagoberto Ligas on 08/22/22 at 10:32 AM and received approval for admission today.   Admission Coordinator:  Michel Santee, PT, DPT time 10:32 AM/Date 08/22/22     Assessment/Plan: Diagnosis: Does the need for close, 24 hr/day Medical supervision in concert with the patient's rehab needs make it unreasonable for this patient to be served in a less intensive setting? Yes Co-Morbidities requiring supervision/potential complications: R pontine expanding infarct- L hemiparesis; B/L cerebellar CVAs/prior 1 month ago; DM, CKD3B; dementia Due to bladder management, bowel management, safety, skin/wound care, disease management, medication administration, pain management, and patient education, does the patient require 24 hr/day rehab nursing? Yes Does the patient require coordinated care of a physician, rehab nurse, PT, OT, and SLP to address physical and functional deficits in the context of the  above medical diagnosis(es)? Yes Addressing deficits in the following areas: balance, endurance, locomotion, strength, transferring, bowel/bladder control, bathing, dressing, feeding, grooming, toileting, cognition, speech, and language Can the patient actively participate in an intensive therapy program of at least 3 hrs of therapy 5 days a week? Yes The potential for patient to make measurable gains while on inpatient rehab is fair Anticipated functional outcomes upon discharge from inpatient rehab: supervision and min assist PT, supervision and min assist OT, supervision and min assist SLP Estimated rehab length of stay to reach the above functional goals is: 10-14 days Anticipated discharge destination: Home 10. Overall Rehab/Functional Prognosis: fair     MD Signature:

## 2022-08-23 DIAGNOSIS — I6350 Cerebral infarction due to unspecified occlusion or stenosis of unspecified cerebral artery: Secondary | ICD-10-CM

## 2022-08-23 LAB — COMPREHENSIVE METABOLIC PANEL
ALT: 131 U/L — ABNORMAL HIGH (ref 0–44)
AST: 67 U/L — ABNORMAL HIGH (ref 15–41)
Albumin: 3.2 g/dL — ABNORMAL LOW (ref 3.5–5.0)
Alkaline Phosphatase: 68 U/L (ref 38–126)
Anion gap: 11 (ref 5–15)
BUN: 29 mg/dL — ABNORMAL HIGH (ref 8–23)
CO2: 23 mmol/L (ref 22–32)
Calcium: 9.1 mg/dL (ref 8.9–10.3)
Chloride: 105 mmol/L (ref 98–111)
Creatinine, Ser: 1.58 mg/dL — ABNORMAL HIGH (ref 0.61–1.24)
GFR, Estimated: 47 mL/min — ABNORMAL LOW (ref >60)
Glucose, Bld: 145 mg/dL — ABNORMAL HIGH (ref 70–99)
Potassium: 4.6 mmol/L (ref 3.5–5.1)
Sodium: 139 mmol/L (ref 135–145)
Total Bilirubin: 0.7 mg/dL (ref 0.3–1.2)
Total Protein: 7.1 g/dL (ref 6.5–8.1)

## 2022-08-23 LAB — CBC WITH DIFFERENTIAL/PLATELET
Abs Immature Granulocytes: 0.01 10*3/uL (ref 0.00–0.07)
Basophils Absolute: 0.1 10*3/uL (ref 0.0–0.1)
Basophils Relative: 1 %
Eosinophils Absolute: 0.1 10*3/uL (ref 0.0–0.5)
Eosinophils Relative: 2 %
HCT: 36.7 % — ABNORMAL LOW (ref 39.0–52.0)
Hemoglobin: 12.3 g/dL — ABNORMAL LOW (ref 13.0–17.0)
Immature Granulocytes: 0 %
Lymphocytes Relative: 25 %
Lymphs Abs: 1.6 10*3/uL (ref 0.7–4.0)
MCH: 27.1 pg (ref 26.0–34.0)
MCHC: 33.5 g/dL (ref 30.0–36.0)
MCV: 80.8 fL (ref 80.0–100.0)
Monocytes Absolute: 0.6 10*3/uL (ref 0.1–1.0)
Monocytes Relative: 9 %
Neutro Abs: 4.1 10*3/uL (ref 1.7–7.7)
Neutrophils Relative %: 63 %
Platelets: 346 10*3/uL (ref 150–400)
RBC: 4.54 MIL/uL (ref 4.22–5.81)
RDW: 13.7 % (ref 11.5–15.5)
WBC: 6.5 10*3/uL (ref 4.0–10.5)
nRBC: 0 % (ref 0.0–0.2)

## 2022-08-23 LAB — FOLATE: Folate: 9.6 ng/mL (ref >5.9)

## 2022-08-23 LAB — IRON AND TIBC
Iron: 41 ug/dL — ABNORMAL LOW (ref 45–182)
Saturation Ratios: 17 % — ABNORMAL LOW (ref 17.9–39.5)
TIBC: 242 ug/dL — ABNORMAL LOW (ref 250–450)
UIBC: 201 ug/dL

## 2022-08-23 LAB — VITAMIN B12: Vitamin B-12: 2716 pg/mL — ABNORMAL HIGH (ref 180–914)

## 2022-08-23 LAB — GLUCOSE, CAPILLARY
Glucose-Capillary: 141 mg/dL — ABNORMAL HIGH (ref 70–99)
Glucose-Capillary: 174 mg/dL — ABNORMAL HIGH (ref 70–99)
Glucose-Capillary: 133 mg/dL — ABNORMAL HIGH (ref 70–99)
Glucose-Capillary: 113 mg/dL — ABNORMAL HIGH (ref 70–99)

## 2022-08-23 LAB — MAGNESIUM: Magnesium: 2.1 mg/dL (ref 1.7–2.4)

## 2022-08-23 MED ORDER — VITAMIN D (ERGOCALCIFEROL) 1.25 MG (50000 UNIT) PO CAPS
50000.0000 [IU] | ORAL_CAPSULE | ORAL | Status: DC
  Administered 2022-08-23 – 2022-08-30 (×2): 50000 [IU] via ORAL
  Filled 2022-08-23 (×2): qty 1

## 2022-08-23 NOTE — Progress Notes (Signed)
Inpatient Rehabilitation Center Individual Statement of Services  Patient Name:  Mitchell Washington  Date:  08/23/2022  Welcome to the [facility].  Our goal is to provide you with an individualized program based on your diagnosis and situation, designed to meet your specific needs.  With this comprehensive rehabilitation program, you will be expected to participate in at least 3 hours of rehabilitation therapies Monday-Friday, with modified therapy programming on the weekends.  Your rehabilitation program will include the following services:  Physical Therapy (PT), Occupational Therapy (OT), Speech Therapy (ST), 24 hour per day rehabilitation nursing, Care Coordinator, Rehabilitation Medicine, Nutrition Services, and Pharmacy Services  Weekly team conferences will be held on Wednesday to discuss your progress.  Your Inpatient Rehabilitation Care Coordinator will talk with you frequently to get your input and to update you on team discussions.  Team conferences with you and your family in attendance may also be held.  Expected length of stay: 10-12 days  Overall anticipated outcome: min assist level  Depending on your progress and recovery, your program may change. Your Inpatient Rehabilitation Care Coordinator will coordinate services and will keep you informed of any changes. Your Inpatient Rehabilitation Care Coordinator's name and contact numbers are listed  below.  The following services may also be recommended but are not provided by the [facility] [facility]:   [place] [facility] will be made to provide these services after discharge if needed.  Arrangements include referral to agencies that provide these services.  Your insurance has been verified to be:  [place] primary doctor is:  Derinda Late  Pertinent information will be shared with your doctor and your insurance  company.  Inpatient Rehabilitation Care Coordinator:  Ovidio Kin, [place] or Emilia Beck  Information discussed with and copy given to patient by: Elease Hashimoto, 08/23/2022, 8:47 AM

## 2022-08-23 NOTE — Evaluation (Signed)
Occupational Therapy Assessment and Plan  Patient Details  Name: Mitchell Washington MRN: [id number] Date of Birth: 10-08-1950  OT Diagnosis: abnormal posture, cognitive deficits, disturbance of vision, hemiplegia affecting non-dominant side, and muscle weakness (generalized) Rehab Potential: Rehab Potential (ACUTE ONLY): Good ELOS: 10-12 days   Today's Date: 08/23/2022 OT Individual Time: 8366-2947 OT Individual Time Calculation (min): 75 min     Hospital Problem: Principal Problem:   Right pontine cerebrovascular accident [facility])   Past Medical History:  Past Medical History:  Diagnosis Date   CVA (cerebral infarction)    Diabetes mellitus without complication ([place])    Gout    Hyperlipidemia    Hypertension    Memory loss    Stroke [facility])    Wears dentures    partial upper   Past Surgical History:  Past Surgical History:  Procedure Laterality Date   APPENDECTOMY     CATARACT EXTRACTION W/PHACO Left 10/25/2019   Procedure: CATARACT EXTRACTION PHACO AND INTRAOCULAR LENS PLACEMENT (IOC) LEFT DIABETIC INTRAVEITREAL KENALOG INJECTION 1.93  00:22.6;  Surgeon: Eulogio Bear, MD;  Location: [place];  Service: Ophthalmology;  Laterality: Left;  Diabetic - oral meds   CATARACT EXTRACTION W/PHACO Right 11/15/2019   Procedure: CATARACT EXTRACTION PHACO AND INTRAOCULAR LENS PLACEMENT ([place]) RIGHT DIABETIC;  Surgeon: Eulogio Bear, MD;  Location: [place];  Service: Ophthalmology;  Laterality: Right;  0.92 0 ;19.7   IR ANGIO INTRA EXTRACRAN SEL COM CAROTID INNOMINATE BILAT MOD SED  07/16/2022   IR ANGIO VERTEBRAL SEL SUBCLAVIAN INNOMINATE UNI R MOD SED  07/16/2022   IR ANGIO VERTEBRAL SEL VERTEBRAL UNI L MOD SED  07/16/2022   IR ANGIO VERTEBRAL SEL VERTEBRAL UNI L MOD SED  07/22/2022   IR CT HEAD LTD  07/18/2022   IR PTA INTRACRANIAL  07/18/2022   RADIOLOGY WITH ANESTHESIA N/A 07/18/2022   Procedure: Cerebral angioplasty with possible stenting;  Surgeon:  Luanne Bras, MD;  Location: [place];  Service: Radiology;  Laterality: N/A;    Assessment & Plan Clinical Impression: Patient is a 72 y.o. year old male with recent admission  on 08/14/2022 to [facility] hospital with acute onset of left-sided weakness as well as hypotension.  CT/MRI showed slight interval expansion of the previously seen right pontine infarction, slightly increased in size compared to previous with further posterior extension towards the floor of the fourth ventricle. Patient transferred to CIR on 08/22/2022 .    Patient currently requires max with basic self-care skills secondary to muscle weakness, abnormal tone, decreased coordination, and decreased motor planning, decreased midline orientation and decreased attention to left, decreased initiation, decreased attention, decreased awareness, decreased problem solving, decreased safety awareness, decreased memory, and delayed processing, and decreased sitting balance, decreased standing balance, decreased postural control, hemiplegia, and decreased balance strategies.  Prior to hospitalization, patient could complete BADL  with min.  Patient will benefit from skilled intervention to decrease level of assist with basic self-care skills and increase independence with basic self-care skills prior to discharge home with care partner.  Anticipate patient will require minimal physical assistance and follow up home health.  OT - End of Session Activity Tolerance: Decreased this session Endurance Deficit: Yes Endurance Deficit Description: multiple rest breaks needed OT Assessment Rehab Potential (ACUTE ONLY): Good OT Patient demonstrates impairments in the following area(s): Balance;Behavior;Perception;Cognition;Sensory;Pain;Endurance;Safety;Vision;Motor OT Basic ADL's Functional Problem(s): Eating;Grooming;Bathing;Dressing;Toileting OT Transfers Functional Problem(s): Toilet;Tub/Shower OT Additional Impairment(s): Fuctional  Use of Upper Extremity OT Plan OT Intensity: Minimum of 1-2 x/day, 45  to 90 minutes OT Frequency: 5 out of 7 days OT Duration/Estimated Length of Stay: 10-12 days OT Treatment/Interventions: Balance/vestibular training;Discharge planning;Functional electrical stimulation;Pain management;Self Care/advanced ADL retraining;Therapeutic Activities;UE/LE Coordination activities;Functional mobility training;Patient/family education;Therapeutic Exercise;Community reintegration;DME/adaptive equipment instruction;Neuromuscular re-education;Splinting/orthotics;UE/LE Strength taining/ROM;Wheelchair propulsion/positioning;Cognitive remediation/compensation;Visual/perceptual remediation/compensation OT Self Feeding Anticipated Outcome(s): supervision OT Basic Self-Care Anticipated Outcome(s): Min assist OT Toileting Anticipated Outcome(s): Min assist OT Bathroom Transfers Anticipated Outcome(s): Min assist OT Recommendation Patient destination: Home Follow Up Recommendations: Home health OT Equipment Recommended: To be determined   OT Evaluation Precautions/Restrictions  Precautions Precautions: Fall Precaution Comments: L hemiparesis, dementia Restrictions Weight Bearing Restrictions: No Vital Signs Therapy Vitals Pulse Rate: (!) 108 BP: (!) 153/87 Patient Position (if appropriate): Sitting Pain Pain Assessment Pain Scale: 0-10 Pain Score: 0-No pain Home Living/Prior Functioning Home Living Living Arrangements: Spouse/significant other Available Help at Discharge: Family, Available 24 hours/day Type of Home: House Home Access: Stairs to enter [employer] of Steps: 3 Entrance Stairs-Rails: Can reach both, Left, Right Home Layout: One level Bathroom Shower/Tub: Multimedia programmer: Standard Bathroom Accessibility: Yes  Lives With: Spouse Prior Function Level of Independence: Needs assistance with ADLs, Needs assistance with homemaking, Requires assistive device  for independence, Needs assistance with gait, Needs assistance with tranfers  Able to Take Stairs?: Yes Driving: No Vocation: Retired Surveyor, mining Baseline Vision/History: 1 Wears glasses Ability to See in Adequate Light: 0 Adequate Patient Visual Report: No change from baseline Vision Assessment?: Vision impaired- to be further tested in functional context Eye Alignment: Impaired (comment) Ocular Range of Motion: Restricted on the left Alignment/Gaze Preference: Within Defined Limits Tracking/Visual Pursuits: Impaired - to be further tested in functional context;Requires cues, head turns, or add eye shifts to track;Decreased smoothness of horizontal tracking Visual Fields: Left visual field deficit Perception  Perception: Impaired Inattention/Neglect: Does not attend to left side of body;Does not attend to left visual field Praxis Praxis: Impaired Praxis Impairment Details: Motor planning;Initiation Praxis-Other Comments: posterior bias, left inattention Cognition Cognition Overall Cognitive Status: History of cognitive impairments - at baseline Arousal/Alertness: Awake/alert Orientation Level: Person;Place;Situation Person: Oriented Place: Oriented Situation: Oriented Memory: Impaired Memory Impairment: Decreased short term memory;Decreased recall of new information;Retrieval deficit;Storage deficit Decreased Short Term Memory: Functional basic Attention: Sustained Focused Attention: Appears intact Sustained Attention: Impaired Sustained Attention Impairment: Functional basic Selective Attention: Impaired Selective Attention Impairment: Verbal basic;Functional basic Awareness: Impaired Awareness Impairment: Intellectual impairment;Emergent impairment Problem Solving: Impaired Problem Solving Impairment: Functional basic Safety/Judgment: Impaired Comments: Decreased insight and awareness Brief Interview for Mental Status (BIMS) Repetition of Three Words (First Attempt):  3 Temporal Orientation: Year: Missed by more than 5 years Temporal Orientation: Month: Accurate within 5 days Temporal Orientation: Day: Incorrect Recall: "Sock": No, could not recall Recall: "Blue": No, could not recall Recall: "Bed": No, could not recall BIMS Summary Score: 5 Sensation Sensation Light Touch: Appears Intact Hot/Cold: Appears Intact Proprioception: Impaired by gross assessment Stereognosis: Not tested Coordination Gross Motor Movements are Fluid and Coordinated: No Fine Motor Movements are Fluid and Coordinated: No Coordination and Movement Description: left hemi, generalized weakness/deconditioning 9 Hole Peg Test: NT Motor  Motor Motor: Hemiplegia;Motor apraxia;Abnormal postural alignment and control;Abnormal tone Motor - Skilled Clinical Observations: L hemi (UE > LE)  Trunk/Postural Assessment  Cervical Assessment Cervical Assessment: Exceptions to [facility] Thoracic Assessment Thoracic Assessment: Exceptions to [facility] Lumbar Assessment Lumbar Assessment: Exceptions to [facility] Postural Control Trunk Control: posterior bias, flexed trunk Righting Reactions: delayed and insufficient  Balance Balance Balance Assessed: Yes Static Sitting Balance Static Sitting - Balance Support:  No upper extremity supported Static Sitting - Level of Assistance: 5: Stand by assistance Dynamic Sitting Balance Dynamic Sitting - Balance Support: During functional activity;No upper extremity supported Dynamic Sitting - Level of Assistance: 4: Min Insurance risk surveyor Standing - Balance Support: Right upper extremity supported;During functional activity Static Standing - Level of Assistance: 4: Min assist Dynamic Standing Balance Dynamic Standing - Balance Support: During functional activity;Right upper extremity supported Dynamic Standing - Level of Assistance: 3: Mod assist Dynamic Standing - Balance Activities: Reaching for objects;Lateral lean/weight shifting;Forward  lean/weight shifting Extremity/Trunk Assessment RUE Assessment RUE Assessment: Within Functional Limits LUE Assessment LUE Assessment: Exceptions to [facility] Passive Range of Motion (PROM) Comments: Functional P/ROM shoulder, elbow, wrist, hand in all ranges. Active Range of Motion (AROM) Comments: shoulder flexion 15*, elbow flex/ext - 50%, mass grasp/release 50% General Strength Comments: NT LUE Body System: Neuro Brunstrum levels for arm and hand: Arm;Hand Brunstrum level for arm: Stage III Synergy is performed voluntarily Brunstrum level for hand: Stage IV Movements deviating from synergies  Care Tool Care Tool Self Care Eating   Eating Assist Level: Supervision/Verbal cueing    Oral Care    Oral Care Assist Level: Minimal Assistance - Patient > 75%    Bathing   Body parts bathed by patient: Face;Chest;Abdomen;Left upper leg;Right upper leg Body parts bathed by helper: Right arm;Left arm;Front perineal area;Buttocks;Left lower leg;Right lower leg   Assist Level: Moderate Assistance - Patient 50 - 74%    Upper Body Dressing(including orthotics)   What is the patient wearing?: Pull over shirt   Assist Level: Moderate Assistance - Patient 50 - 74%    Lower Body Dressing (excluding footwear)   What is the patient wearing?: Pants;Incontinence brief Assist for lower body dressing: Maximal Assistance - Patient 25 - 49%    Putting on/Taking off footwear   What is the patient wearing?: [place] for footwear: Maximal Assistance - Patient 25 - 49%       Care Tool Toileting Toileting activity   Assist for toileting: Total Assistance - Patient < 25%     Care Tool Bed Mobility Roll left and right activity   Roll left and right assist level: Moderate Assistance - Patient 50 - 74%    Sit to lying activity   Sit to lying assist level: Moderate Assistance - Patient 50 - 74%    Lying to sitting on side of bed activity   Lying to sitting on side of bed assist level: the ability  to move from lying on the back to sitting on the side of the bed with no back support.: Moderate Assistance - Patient 50 - 74%     Care Tool Transfers Sit to stand transfer   Sit to stand assist level: Moderate Assistance - Patient 50 - 74%    Chair/bed transfer   Chair/bed transfer assist level: Moderate Assistance - Patient 50 - 74%     Toilet transfer   Assist Level: Maximal Assistance - Patient 24 - 49%     Care Tool Cognition  Expression of Ideas and Wants Expression of Ideas and Wants: 2. Frequent difficulty - frequently exhibits difficulty with expressing needs and ideas  Understanding Verbal and Non-Verbal Content Understanding Verbal and Non-Verbal Content: 2. Sometimes understands - understands only basic conversations or simple, direct phrases. Frequently requires cues to understand   Memory/Recall Ability Memory/Recall Ability : That he or she is in a hospital/hospital unit   Refer to Care Plan for Long Term Goals  SHORT TERM GOAL WEEK 1 OT Short Term Goal 1 (Week 1): Patient will don pants with mod assist OT Short Term Goal 2 (Week 1): Patient will dress upper body with min assist OT Short Term Goal 3 (Week 1): Patient will bathe upper body self  min assist OT Short Term Goal 4 (Week 1): Pt will complete toilet transfer with Min A.  Recommendations for other services: None    Skilled Therapeutic Intervention:   Patient received supine in bed, dressed for the day.  Patient indicating need to void and walked to bathroom with max assist.  Patient with surface dependent behaviors and grabbing for sink, wall bars, doorframe.  Patient able to walk more safely in room with rolling walker - he may benefit from a built up grip on left side.  Patient had large continent BM - needing max/total assist for toileting.  Patient's wife not present.  Patient completed bathing and hygiene at sink, and returned to bed with signs of fatigue.   Bed alarm set and call bell within reach.  Lab  personnel in room.  ADL ADL Eating: Supervision/safety Where Assessed-Eating: Wheelchair Grooming: Minimal assistance Where Assessed-Grooming: Sitting at sink;Wheelchair Upper Body Bathing: Moderate assistance Where Assessed-Upper Body Bathing: Sitting at sink Lower Body Bathing: Maximal assistance Where Assessed-Lower Body Bathing: Sitting at sink;Standing at sink Upper Body Dressing: Moderate assistance Where Assessed-Upper Body Dressing: Sitting at sink;Wheelchair Lower Body Dressing: Maximal assistance Where Assessed-Lower Body Dressing: Sitting at sink;Wheelchair;Standing at sink Toileting: Maximal assistance Where Assessed-Toileting: Glass blower/designer: Maximal Print production planner Method: Arts development officer: Energy manager: Unable to assess Tub/Shower Transfer Method: Unable to assess Social research officer, government: Unable to assess Health Net Method: Unable to assess ADL Comments: need to reassess patient's equipment needs when wife is present Mobility  Bed Mobility Bed Mobility: Rolling Right;Rolling Left;Supine to Sit;Sit to Supine Rolling Right: Moderate Assistance - Patient 50-74% Rolling Left: Supervision/Verbal cueing Supine to Sit: Moderate Assistance - Patient 50-74% Sit to Supine: Moderate Assistance - Patient 50-74% Transfers Sit to Stand: Moderate Assistance - Patient 50-74% Stand to Sit: Moderate Assistance - Patient 50-74%   Discharge Criteria: Patient will be discharged from OT if patient refuses treatment 3 consecutive times without medical reason, if treatment goals not met, if there is a change in medical status, if patient makes no progress towards goals or if patient is discharged from hospital.  The above assessment, treatment plan, treatment alternatives and goals were discussed and mutually agreed upon: No family available/patient unable  Mariah Milling 08/23/2022, 12:29 PM

## 2022-08-23 NOTE — Progress Notes (Addendum)
Inpatient Rehabilitation Admission Medication Review by a Pharmacist  A complete drug regimen review was completed for this patient to identify any potential clinically significant medication issues.  High Risk Drug Classes Is patient taking? Indication by Medication  Antipsychotic No   Anticoagulant Yes Enoxaparin- DVT prophylaxis  Antibiotic No   Opioid No   Antiplatelet Yes ASA, Brilinta-CVA   Hypoglycemics/insulin Yes Insulin aspart, glipizide- diabetes  Vasoactive Medication Yes Amlodipine, lisinopril -HTN  Chemotherapy No   Other Yes Atorvastatin- HLD Tylenol-pain Memenatine, Aricept-dementia Vitamin B12- supplement Ergocalciferol- Vit D deficiency     Type of Medication Issue Identified Description of Issue Recommendation(s)  Drug Interaction(s) (clinically significant)     Duplicate Therapy     Allergy     No Medication Administration End Date     Incorrect Dose     Additional Drug Therapy Needed     Significant med changes from prior encounter (inform family/care partners about these prior to discharge). Plavix PTA Plavix changed to Brilinta  Other       Clinically significant medication issues were identified that warrant physician communication and completion of prescribed/recommended actions by midnight of the next day:  No  Name of provider notified for urgent issues identified:   Provider Method of Notification:     Pharmacist comments:   Time spent performing this drug regimen review (minutes):  20  Thank you for allowing pharmacy to be part of this patients care team.  Nicole Cella, RPh Clinical Pharmacist 08/23/2022 12:07 PM

## 2022-08-23 NOTE — Evaluation (Addendum)
Speech Language Pathology Assessment and Plan  Patient Details  Name: Mitchell Washington MRN: [id number] Date of Birth: 07/01/1951  SLP Diagnosis: Dysarthria  Rehab Potential: Fair ELOS: 7-10 days    Today's Date: 08/23/2022 SLP Individual Time: 5188-4166 SLP Individual Time Calculation (min): 54 min   Hospital Problem: Principal Problem:   Right pontine cerebrovascular accident [facility])  Past Medical History:  Past Medical History:  Diagnosis Date   CVA (cerebral infarction)    Diabetes mellitus without complication (HCC)    Gout    Hyperlipidemia    Hypertension    Memory loss    Stroke [facility])    Wears dentures    partial upper   Past Surgical History:  Past Surgical History:  Procedure Laterality Date   APPENDECTOMY     CATARACT EXTRACTION W/PHACO Left 10/25/2019   Procedure: CATARACT EXTRACTION PHACO AND INTRAOCULAR LENS PLACEMENT (IOC) LEFT DIABETIC INTRAVEITREAL KENALOG INJECTION 1.93  00:22.6;  Surgeon: Nevada Crane, MD;  Location: [facility];  Service: Ophthalmology;  Laterality: Left;  Diabetic - oral meds   CATARACT EXTRACTION W/PHACO Right 11/15/2019   Procedure: CATARACT EXTRACTION PHACO AND INTRAOCULAR LENS PLACEMENT (IOC) RIGHT DIABETIC;  Surgeon: Nevada Crane, MD;  Location: [facility];  Service: Ophthalmology;  Laterality: Right;  0.92 0 ;19.7   IR ANGIO INTRA EXTRACRAN SEL COM CAROTID INNOMINATE BILAT MOD SED  07/16/2022   IR ANGIO VERTEBRAL SEL SUBCLAVIAN INNOMINATE UNI R MOD SED  07/16/2022   IR ANGIO VERTEBRAL SEL VERTEBRAL UNI L MOD SED  07/16/2022   IR ANGIO VERTEBRAL SEL VERTEBRAL UNI L MOD SED  07/22/2022   IR CT HEAD LTD  07/18/2022   IR PTA INTRACRANIAL  07/18/2022   RADIOLOGY WITH ANESTHESIA N/A 07/18/2022   Procedure: Cerebral angioplasty with possible stenting;  Surgeon: Julieanne Cotton, MD;  Location: [facility] OR;  Service: Radiology;  Laterality: N/A;    Assessment / Plan / Recommendation Clinical Impression Patient  Admitting Diagnosis: CVA   History of Present Illness: Mitchell Washington is a 72 year old right-handed male with history of diabetes mellitus, CKD stage III with baseline creatinine 1.38-1.86, hypertension, dementia maintained on Aricept as well as Namenda, 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  Presented 08/14/2022 to [facility] hospital with acute onset of left-sided weakness as well as hypotension.  CT/MRI showed slight interval expansion of the previously seen right pontine infarction, slightly increased in size compared to previous with further posterior extension towards the floor of the fourth ventricle.  No associated hemorrhage or mass effect.  Previously seen scattered small cerebellar infarcts have largely resolved.  No other acute intracranial abnormality.  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  Superimposed significant bilateral supraclinoid ICA atherosclerotic stenosis also redemonstrated and stable.  Recent echocardiogram with ejection fraction of 60 to 65% no wall motion abnormalities.  Admission chemistries unremarkable except potassium 5.4 BUN 57 creatinine 2.75.  Neurology follow-up patient currently remains on aspirin and Brilinta as prior to admission.  Lovenox added for DVT prophylaxis.  AKI on CKD stage III elevated creatinine felt to be related to hypotension received IV fluids with latest creatinine 1.58.  He is tolerating a regular consistency diet.  Therapy evaluations completed and pt was recommended for a comprehensive rehab program.     Complete NIHSS  TOTAL: 5  SLP consulted to completed cognitive-linguistic, motor speech, and swallowing evaluation in the setting of extension of  previous CVA. Pt greeted sleeping in bed; aroused to name. Affect flat with pt asking when he can go home. Of note, pt known to evaluating SLP during previous CIR admission, at which time he presented with functional deficits in the areas of motor speech (moderate dysarthria) c/b low vocal intensity, poor pacing/rapid speech rate, and imprecise articulation.  Per informal and portions of formal assessments, pt continues to present with at least moderate dysarthria c/b low vocal intensity, poor pacing, atypical/rapid speech rate, and imprecise articulation. Speech intelligibility was further compounded by baseline dysfluencies to include sound repetitions, incoordination, and phonatory breaks. Pt continues to exhibit decreased awareness of how his speech is perceived by communication partner; therefore does not correct speech errors nor attempt to repair communication breakdown. Pt's speech intelligibility was subjectively judged to be ~80% at the word level with phrase level being judged to be ~75% in a quiet environment to an unfamiliar listener. Pt with minimal vs intermittent response to skilled verbal cues to increase vocal intensity and slow the rate of his speech, less than last admission. Pt with cognitive-linguistic deficits at baseline; only oriented to self, general location, and month. Unable to demonstrate recall of previous events from earlier this date. Receptive and expressive language skills appear grossly intact for simple communication and command following. Breakdown occurs with multi-step commands due to decreased attention + working memory and higher-level naming tasks that require increasing mental flexibility (e.g. generative naming). Pt verbalized that he wishes to go home despite education on purpose of CIR admission. Continues to appear apathetic towards therapy with no generalization/carryover noted from previous SLP sessions; pt unable to recall any speech intelligibility strategies  addressed during recent admission in December; therefore, prognosis for recovery/improvement of speech intelligibility is fair.  Re: deglutition, pt presents with functional oropharyngeal skills as evident by no overt s/sx concerning for airway compromise and functional oral manipulation, mastication, and timely A-P transit for solid textures. Pt mildly impulsive with intake of solid textures; nevertheless, managed safely with minimal to no oral residuals noted post-swallow. Continue to recommend current diet textures and thin liquids given full supervision for safety and adherence to general aspiration precautions, particularly slow rate, small + single bites/sips, and upright positioning; NT and RN notified.  Given pt's current deficits, which appear slightly decreased from admission in December 2023, recommend short-course of ST intervention to provide compensatory strategy training and family/pt education. Results and recommendations reviewed with pt, though he is unable to demonstrate full understanding of ST POC in the setting of dementia.  Inpusive with eating little awareness and response to verbal cues.   Skilled Therapeutic Interventions          BSE and informal speech, language, and cognitive assessment administered. Please see full note for details.    SLP Assessment  Patient will need skilled Speech Lanaguage Pathology Services during CIR admission    Recommendations  SLP Diet Recommendations: Age appropriate regular solids;Thin Oral Care Recommendations: Oral care BID Patient destination: Home Follow up Recommendations: Home Health SLP;24 hour supervision/assistance Equipment Recommended: None recommended by SLP    SLP Frequency 1 to 3 out of 7 days   SLP Duration  SLP Intensity  SLP Treatment/Interventions 7-10 days  Minumum of 1-2 x/day, 30 to 90 minutes  Internal/external aids;Functional tasks;Patient/family education;Multimodal communication approach    Pain Pain  Assessment Pain Scale: 0-10 Pain Score: 0-No pain  Prior Functioning Cognitive/Linguistic Baseline: Baseline deficits Baseline deficit details: hx of dementia with baseline memory deficits Type of Home: House  Lives With: Spouse Available Help at Discharge: Family;Available 24 hours/day Vocation: Retired  Architectural technologist Overall Cognitive Status: History of cognitive impairments - at baseline Arousal/Alertness: Awake/alert Orientation Level: Oriented to person;Oriented to place;Disoriented to time;Disoriented to situation Year: Other (Comment) (1993) Month: January Day of Week: Other (Comment) (Pt unable to provide) Attention: Sustained;Focused Focused Attention: Appears intact Sustained Attention: Impaired Sustained Attention Impairment: Functional basic Selective Attention: Impaired Selective Attention Impairment: Verbal basic;Functional basic Memory: Impaired Memory Impairment: Decreased short term memory;Decreased recall of new information;Retrieval deficit;Storage deficit Decreased Short Term Memory: Functional basic;Verbal basic Awareness: Impaired Awareness Impairment: Intellectual impairment;Emergent impairment Problem Solving: Impaired Problem Solving Impairment: Functional basic;Verbal basic Executive Function:  (impaired) Behaviors: Perseveration Safety/Judgment: Impaired Comments: Decreased insight and awareness  Comprehension Auditory Comprehension Overall Auditory Comprehension: Appears within functional limits for tasks assessed Yes/No Questions: Impaired (for basic and environmental) Basic Biographical Questions: 76-100% accurate (100%) Basic Immediate Environment Questions: 75-100% accurate (100%) Complex Questions: 25-49% accurate (40%) Commands: Impaired One Step Basic Commands: 75-100% accurate (100%) Two Step Basic Commands: 75-100% accurate (100%) Multistep Basic Commands: 25-49% accurate (33%) Conversation: Simple Interfering  Components: Attention;Motor planning;Working Radio broadcast assistant: Wellsite geologist: Not tested Reading Comprehension Reading Status: Not tested Expression Expression Primary Mode of Expression: Verbal Verbal Expression Initiation: No impairment Automatic Speech: Counting;Day of week Level of Generative/Spontaneous Verbalization: Word;Phrase Repetition: No impairment (COGNISTAT verbal repetition subtest: 11/12 - WFL) Naming: Impairment Responsive: 26-50% accurate Confrontation: Within functional limits (CLQT Confrontation Naming: 10/10 with extra time; generaltive naming - only named 1 animal in 1 minute) Divergent: 0-24% accurate Pragmatics: Impairment Impairments: Abnormal affect;Dysprosody;Eye contact;Monotone Interfering Components: Attention;Speech intelligibility Non-Verbal Means of Communication: Not applicable Written Expression Dominant Hand: Right Written Expression: Not tested Oral Motor Oral Motor/Sensory Function Facial ROM: Reduced left Facial Symmetry: Abnormal symmetry left Facial Strength: Reduced left Facial Sensation: Within Functional Limits Lingual ROM: Within Functional Limits Mandible: Within Functional Limits Motor Speech Overall Motor Speech: Impaired at baseline Respiration: Within functional limits Phonation: Low vocal intensity;Breathy;Other (comment);Aphonic (phonatory breaks) Resonance: Within functional limits Articulation: Impaired Level of Impairment: Phrase Intelligibility: Intelligibility reduced Word: 75-100% accurate (80-90%) Phrase: 75-100% accurate (75-85%) Sentence: 50-74% accurate (50%) Conversation: Not tested Motor Planning: Impaired Level of Impairment: Phrase Motor Speech Errors: Consistent (repetitions, irregular rhythm) Effective Techniques: Increased vocal intensity;Slow rate;Over-articulate;Pause  Care Tool Care Tool  Cognition Ability to hear (with hearing aid or hearing appliances if normally used Ability to hear (with hearing aid or hearing appliances if normally used): 0. Adequate - no difficulty in normal conservation, social interaction, listening to TV   Expression of Ideas and Wants Expression of Ideas and Wants: 2. Frequent difficulty - frequently exhibits difficulty with expressing needs and ideas   Understanding Verbal and Non-Verbal Content Understanding Verbal and Non-Verbal Content: 2. Sometimes understands - understands only basic conversations or simple, direct phrases. Frequently requires cues to understand  Memory/Recall Ability Memory/Recall Ability : That he or she is in a hospital/hospital unit     Bedside Swallowing Assessment General Previous Swallow Assessment: BSE on 12/7 Diet Prior to this Study: Regular;Thin liquids Temperature Spikes Noted: No Respiratory Status: Room air History of Recent Intubation: No Behavior/Cognition: Distractible;Requires cueing;Impulsive;Lethargic/Drowsy Oral Cavity - Dentition: Poor condition;Dentures, top;Missing dentition Self-Feeding Abilities: Able to feed self;Needs set up Patient Positioning: Upright in bed Baseline Vocal Quality: Low vocal intensity;Breathy Volitional Cough: Weak Volitional Swallow: Able  to elicit  Ice Chips Ice chips: Not tested Thin Liquid Thin Liquid: Within functional limits Nectar Thick Nectar Thick Liquid: Not tested Honey Thick Honey Thick Liquid: Not tested Puree Puree: Not tested Solid Solid: Within functional limits Other Comments: Impulsive BSE Assessment Suspected Esophageal Findings Suspected Esophageal Findings:  (None observed) Risk for Aspiration Impact on safety and function: No limitations Other Related Risk Factors: Previous CVA;Cognitive impairment  Short Term Goals: Week 1: SLP Short Term Goal 1 (Week 1): Pt will utilize pacing, overarticulation, and increased vocal intensity to improve  speech intelligibility to 80% at the phrase level, to communicate wants/needs/ideas and personal information + functional phrases given Mod A multimodal cues. SLP Short Term Goal 2 (Week 1): Pt will verbalize 2 techniques/modifications that would be helpful to aid in speech comprehensibility within the context of functional communication tasks with Mod A following education.  Refer to Care Plan for Long Term Goals  Recommendations for other services: None   Discharge Criteria: Patient will be discharged from SLP if patient refuses treatment 3 consecutive times without medical reason, if treatment goals not met, if there is a change in medical status, if patient makes no progress towards goals or if patient is discharged from hospital.  The above assessment, treatment plan, treatment alternatives and goals were discussed and mutually agreed upon: No family available/patient unable  Mitchell Washington 08/23/2022, 2:03 PM

## 2022-08-23 NOTE — H&P (Signed)
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  Per chart review patient lives with spouse.  1 level home 3 steps to entry.  Presented 08/14/2022 to [facility] hospital with acute onset of left-sided weakness as well as hypotension.  CT/MRI showed slight interval expansion of the previously seen right pontine infarction, slightly increased in size compared to previous with further posterior extension towards the floor of the fourth ventricle.  No associated hemorrhage or mass effect.  Previously seen scattered small cerebellar infarcts have largely resolved.  No other acute intracranial abnormality.  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  Superimposed significant bilateral supraclinoid ICA atherosclerotic stenosis also redemonstrated and stable.  Recent echocardiogram with ejection fraction of 60 to 65% no wall motion abnormalities.  Admission chemistries unremarkable except potassium 5.4 BUN 57 creatinine 2.75.  Neurology follow-up patient currently remains on aspirin and Brilinta as prior to admission.  Lovenox added for DVT prophylaxis.  AKI on CKD stage III elevated creatinine felt to be related to hypotension received IV fluids with latest creatinine 1.58.  He is tolerating a regular consistency diet.  Therapy evaluations completed due to patient decreased functional mobility left-sided weakness was admitted for a comprehensive  rehab program. He has no complaints this morning.   Review of Systems  Constitutional:  Negative for chills and fever.  HENT:  Negative for hearing loss.   Eyes:  Negative for blurred vision and double vision.  Respiratory:  Negative for cough, shortness of breath and wheezing.   Cardiovascular:  Negative for chest pain, palpitations and leg swelling.  Gastrointestinal:  Positive for constipation. Negative for heartburn, nausea and vomiting.  Genitourinary:  Positive for urgency. Negative for dysuria, flank pain and hematuria.  Musculoskeletal:  Positive for joint pain and myalgias.  Skin:  Negative for rash.  Neurological:  Positive for weakness.  Psychiatric/Behavioral:  Positive for memory loss.   All other systems reviewed and are negative.  Past Medical History:  Diagnosis Date   CVA (cerebral infarction)    Diabetes mellitus without complication ([place])    Gout    Hyperlipidemia    Hypertension    Memory loss    Stroke [facility])    Wears dentures    partial upper   Past Surgical History:  Procedure Laterality Date   APPENDECTOMY     CATARACT EXTRACTION W/PHACO Left 10/25/2019   Procedure: CATARACT EXTRACTION PHACO AND INTRAOCULAR LENS PLACEMENT (IOC) LEFT DIABETIC INTRAVEITREAL KENALOG INJECTION 1.93  00:22.6;  Surgeon: Eulogio Bear, MD;  Location: [place];  Service: Ophthalmology;  Laterality: Left;  Diabetic - oral meds   CATARACT EXTRACTION W/PHACO Right 11/15/2019   Procedure: CATARACT EXTRACTION PHACO AND INTRAOCULAR LENS PLACEMENT ([place]) RIGHT DIABETIC;  Surgeon: Eulogio Bear, MD;  Location: [place];  Service: Ophthalmology;  Laterality: Right;  0.92 0 ;19.7   IR ANGIO INTRA EXTRACRAN SEL COM CAROTID INNOMINATE BILAT MOD SED  07/16/2022   IR ANGIO VERTEBRAL SEL SUBCLAVIAN INNOMINATE UNI R MOD SED  07/16/2022   IR ANGIO VERTEBRAL SEL VERTEBRAL  UNI L MOD SED  07/16/2022   IR ANGIO VERTEBRAL SEL VERTEBRAL UNI L MOD SED  07/22/2022   IR CT HEAD  LTD  07/18/2022   IR PTA INTRACRANIAL  07/18/2022   RADIOLOGY WITH ANESTHESIA N/A 07/18/2022   Procedure: Cerebral angioplasty with possible stenting;  Surgeon: Julieanne Cotton, MD;  [facility];  Service: Radiology;  Laterality: N/A;   Family History  Problem Relation Age of Onset   Diabetes Mother    Social History:  reports that he has quit smoking. He has never used smokeless tobacco. He reports that he does not currently use alcohol. He reports that he does not use drugs. Allergies: No Known Allergies Medications Prior to Admission  Medication Sig Dispense Refill   acetaminophen (TYLENOL) 325 MG tablet Take 2 tablets (650 mg total) by mouth every 4 (four) hours as needed for mild pain (or temp > 37.5 C (99.5 F)).     amLODipine (NORVASC) 2.5 MG tablet Take 1 tablet (2.5 mg total) by mouth daily. 30 tablet 0   aspirin EC 81 MG tablet Take 81 mg by mouth daily.     atorvastatin (LIPITOR) 80 MG tablet Take 1 tablet (80 mg total) by mouth daily. 30 tablet 0   cyanocobalamin (VITAMIN B12) 1000 MCG tablet Take 1 tablet (1,000 mcg total) by mouth daily. 30 tablet 0   diclofenac Sodium (VOLTAREN) 1 % GEL Apply 2 g topically 4 (four) times daily. 2 g 0   donepezil (ARICEPT) 10 MG tablet Take 10 mg by mouth at bedtime.     feeding supplement (ENSURE ENLIVE / ENSURE PLUS) LIQD Take 237 mLs by mouth 2 (two) times daily between meals. 237 mL 12   glipiZIDE (GLUCOTROL XL) 2.5 MG 24 hr tablet Take 1 tablet (2.5 mg total) by mouth daily before breakfast. 30 tablet 0   lisinopril (ZESTRIL) 5 MG tablet Take 1 tablet (5 mg total) by mouth daily. 30 tablet 0   memantine (NAMENDA) 10 MG tablet Take 10 mg by mouth 2 (two) times daily.     ticagrelor (BRILINTA) 90 MG TABS tablet Take 1 tablet (90 mg total) by mouth 2 (two) times daily. 60 tablet 0      Home: Home Living Living Arrangements: Spouse/significant other Available Help at Discharge: Family, Available 24 hours/day Type of Home:  House Home Access: Stairs to enter [employer] of Steps: 3 Entrance Stairs-Rails: Can reach both, Left, Right Home Layout: One level Bathroom Shower/Tub: Engineer, manufacturing systems: Yes  Lives With: Spouse   Functional History:    Functional Status:  Mobility:     Ambulation/Gait Gait Distance (Feet): 8 Feet Gait velocity: decreased Number of Stairs: 4    ADL:    Cognition: Cognition Overall Cognitive Status: History of cognitive impairments - at baseline Arousal/Alertness: Awake/alert Orientation Level: Oriented to person, Oriented to place, Disoriented to time, Disoriented to situation ("bad shape" for situation. "hospital" for place) Year: 2023 Month: January Day of Week: Incorrect Focused Attention: Appears intact Sustained Attention: Impaired Sustained Attention Impairment: Verbal basic, Functional basic Selective Attention: Impaired Selective Attention Impairment: Verbal basic, Functional basic Memory: Impaired Memory Impairment: Decreased short term memory, Decreased recall of new information, Retrieval deficit, Storage deficit Decreased Short Term Memory: Verbal basic, Functional basic Awareness: Impaired Awareness Impairment: Emergent impairment, Intellectual impairment Problem Solving: Impaired Problem Solving Impairment: Functional basic, Verbal basic Safety/Judgment: Impaired Comments: Decreased insight and awareness Cognition Overall Cognitive Status: History of cognitive impairments - at baseline  Physical Exam: Blood pressure (!) 153/87, pulse 76, temperature 98.3 F (36.8 C), resp. rate 16, height 5\' 9"  (1.753 m), weight 69.7 kg, SpO2 98 %. Gen: no distress, normal appearing HEENT: oral mucosa pink and moist, NCAT Cardio: Reg rate Chest: normal effort, normal rate of breathing Abd: soft, non-distended Ext: no edema Psych: pleasant, normal affect Skin: intact Neuro: Patient is alert makes eye  contact with examiner.  Follows simple commands.  Oriented to state but not city.  He can provide the month but not appropriate year. LUE 3/5 strength, 4/5 throughout otherwise    Results for orders placed or performed during the hospital encounter of 08/22/22 (from the past 48 hour(s))  Glucose, capillary     Status: Abnormal   Collection Time: 08/22/22  5:11 PM  Result Value Ref Range   Glucose-Capillary 165 (H) 70 - 99 mg/dL    Comment: Glucose reference range applies only to samples taken after fasting for at least 8 hours.  CBC     Status: Abnormal   Collection Time: 08/22/22  5:32 PM  Result Value Ref Range   WBC 6.3 4.0 - 10.5 K/uL   RBC 4.35 4.22 - 5.81 MIL/uL   Hemoglobin 11.7 (L) 13.0 - 17.0 g/dL   HCT 36.4 (L) 39.0 - 52.0 %   MCV 83.7 80.0 - 100.0 fL   MCH 26.9 26.0 - 34.0 pg   MCHC 32.1 30.0 - 36.0 g/dL   RDW 14.0 11.5 - 15.5 %   Platelets 350 150 - 400 K/uL   nRBC 0.0 0.0 - 0.2 %    Comment: Performed at [facility], [place] [address], [place], [place] [address]  Creatinine, serum     Status: Abnormal   Collection Time: 08/22/22  5:32 PM  Result Value Ref Range   Creatinine, Ser 1.68 (H) 0.61 - 1.24 mg/dL   GFR, Estimated 43 (L) >60 mL/min    Comment: (NOTE) Calculated using the CKD-EPI Creatinine Equation (2021) Performed at [facility] [address], [place], [place] [address]   Glucose, capillary     Status: Abnormal   Collection Time: 08/22/22  9:11 PM  Result Value Ref Range   Glucose-Capillary 117 (H) 70 - 99 mg/dL    Comment: Glucose reference range applies only to samples taken after fasting for at least 8 hours.   Comment 1 Notify RN   Glucose, capillary     Status: Abnormal   Collection Time: 08/23/22  5:39 AM  Result Value Ref Range   Glucose-Capillary 141 (H) 70 - 99 mg/dL    Comment: Glucose reference range applies only to samples taken after fasting for at least 8 hours.  Folate     Status: None   Collection Time: 08/23/22  6:02 AM   Result Value Ref Range   Folate 9.6 >5.9 ng/mL    Comment: Performed at [facility], [place] [address], [place], [place] [address]  Comprehensive metabolic panel     Status: Abnormal   Collection Time: 08/23/22  6:07 AM  Result Value Ref Range   Sodium 139 135 - 145 mmol/L   Potassium 4.6 3.5 - 5.1 mmol/L   Chloride 105 98 - 111 mmol/L   CO2 23 22 - 32 mmol/L   Glucose, Bld 145 (H) 70 - 99 mg/dL    Comment: Glucose reference range applies only to samples taken after fasting for at least 8 hours.   BUN 29 (H) 8 - 23 mg/dL  Creatinine, Ser 1.58 (H) 0.61 - 1.24 mg/dL   Calcium 9.1 8.9 - 94.7 mg/dL   Total Protein 7.1 6.5 - 8.1 g/dL   Albumin 3.2 (L) 3.5 - 5.0 g/dL   AST 67 (H) 15 - 41 U/L   ALT 131 (H) 0 - 44 U/L   Alkaline Phosphatase 68 38 - 126 U/L   Total Bilirubin 0.7 0.3 - 1.2 mg/dL   GFR, Estimated 47 (L) >60 mL/min    Comment: (NOTE) Calculated using the CKD-EPI Creatinine Equation (2021)    Anion gap 11 5 - 15    Comment: Performed at [facility], [address], [place], [place] [address]  CBC with Differential/Platelet     Status: Abnormal   Collection Time: 08/23/22  6:07 AM  Result Value Ref Range   WBC 6.5 4.0 - 10.5 K/uL   RBC 4.54 4.22 - 5.81 MIL/uL   Hemoglobin 12.3 (L) 13.0 - 17.0 g/dL   HCT 36.6 (L) 29.4 - 76.5 %   MCV 80.8 80.0 - 100.0 fL   MCH 27.1 26.0 - 34.0 pg   MCHC 33.5 30.0 - 36.0 g/dL   RDW 46.5 03.5 - 46.5 %   Platelets 346 150 - 400 K/uL   nRBC 0.0 0.0 - 0.2 %   Neutrophils Relative % 63 %   Neutro Abs 4.1 1.7 - 7.7 K/uL   Lymphocytes Relative 25 %   Lymphs Abs 1.6 0.7 - 4.0 K/uL   Monocytes Relative 9 %   Monocytes Absolute 0.6 0.1 - 1.0 K/uL   Eosinophils Relative 2 %   Eosinophils Absolute 0.1 0.0 - 0.5 K/uL   Basophils Relative 1 %   Basophils Absolute 0.1 0.0 - 0.1 K/uL   Immature Granulocytes 0 %   Abs Immature Granulocytes 0.01 0.00 - 0.07 K/uL    Comment: Performed at [facility], [address]  [place], [place] [address]   No results found.    Blood pressure (!) 153/87, pulse 76, temperature 98.3 F (36.8 C), resp. rate 16, height 5\' 9"  (1.753 m), weight 69.7 kg, SpO2 98 %.  Medical Problem List and Plan: 1. 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  MRI 12/27 slight interval expansion of previously an acute right pontine infarction  -patient may shower  -ELOS/Goals: 10-14 days  Admit to CIR 2.  Antithrombotics: -DVT/anticoagulation:  Pharmaceutical: Lovenox  -antiplatelet therapy: Aspirin 81 mg daily and Brilinta 90 mg twice daily 3. Pain Management: Tylenol as needed 4. Mood/Behavior/Sleep: Aricept 10 mg nightly, Namenda 10 mg twice daily  -antipsychotic agents: N/A 5. Neuropsych/cognition: This patient is not capable of making decisions on his own behalf. 6. Skin/Wound Care: Routine skin checks 7. Fluids/Electrolytes/Nutrition: Routine in and outs with follow-up chemistries 8.  Hypertension.  Norvasc 2.5 mg daily, lisinopril 5 mg daily.  Monitor with increased mobility 9.  Hyperlipidemia.  Lipitor 10.  AKI on CKD stage III.  AKI felt to be related to hypotension.  Follow-up chemistries.  Baseline creatinine 1.38-1.86. Cr reviewed and is 1.58.  11.  Diabetes mellitus.  Hemoglobin A1c 6.5.  Resume glipizide 2.5 mg daily. Magnesium reviewed and is normal.  12. Vitamin D deficiency: start ergocalciferol 50,000U once per week  I have personally performed a face to face diagnostic evaluation, including, but not limited to relevant history and physical exam findings, of this patient  and developed relevant assessment and plan.  Additionally, I have reviewed and concur with the physician assistant's documentation above.  Maryla Morrow, MD, ABPMR  Horton Chin, MD 08/23/2022

## 2022-08-23 NOTE — Discharge Instructions (Addendum)
Inpatient Rehab Discharge Instructions  Mitchell Washington Discharge date and time: No discharge date for patient encounter.   Activities/Precautions/ Functional Status: Activity: activity as tolerated Diet: diabetic diet Wound Care: Routine skin checks Functional status:  ___ No restrictions     ___ Walk up steps independently ___ 24/7 supervision/assistance   ___ Walk up steps with assistance ___ Intermittent supervision/assistance  ___ Bathe/dress independently ___ Walk with walker     _x__ Bathe/dress with assistance ___ Walk Independently    ___ Shower independently ___ Walk with assistance    ___ Shower with assistance ___ No alcohol     ___ Return to work/school ________  Special Instructions:  No driving smoking or alcohol  Continue to hold Norvasc and lisinopril due to hypotension follow-up with PCP on resuming antihypertensive medications as needed    COMMUNITY REFERRALS UPON DISCHARGE:    Home Health:   PT  OT  SP                 Agency: [facility]    Phone:[phone]   Medical Equipment/Items Ordered:HOSPITAL BED RECOMMENDED BUT WIFE REPORTS NOT ROOM FOR ONE AND DECLINED IT                                                 Agency/Supplier: NA    STROKE/TIA DISCHARGE INSTRUCTIONS SMOKING Cigarette smoking nearly doubles your risk of having a stroke & is the single most alterable risk factor  If you smoke or have smoked in the last 12 months, you are advised to quit smoking for your health. Most of the excess cardiovascular risk related to smoking disappears within a year of stopping. Ask you doctor about anti-smoking medications [place] Quit Line: 1-800-QUIT NOW Free Smoking Cessation Classes [phone]  CHOLESTEROL Know your levels; limit fat & cholesterol in your diet  Lipid Panel     Component Value Date/Time   CHOL 71 08/15/2022 0417   TRIG 72 08/15/2022 0417   HDL 29 (L) 08/15/2022 0417   CHOLHDL 2.4 08/15/2022 0417   VLDL 14 08/15/2022 0417    LDLCALC 28 08/15/2022 0417     Many patients benefit from treatment even if their cholesterol is at goal. Goal: Total Cholesterol (CHOL) less than 160 Goal:  Triglycerides (TRIG) less than 150 Goal:  HDL greater than 40 Goal:  LDL (LDLCALC) less than 100   BLOOD PRESSURE American Stroke Association blood pressure target is less that 120/80 mm/Hg  Your discharge blood pressure is:  BP: (!) 153/67 Monitor your blood pressure Limit your salt and alcohol intake Many individuals will require more than one medication for high blood pressure  DIABETES (A1c is a blood sugar average for last 3 months) Goal HGBA1c is under 7% (HBGA1c is blood sugar average for last 3 months)  Diabetes:   Lab Results  Component Value Date   HGBA1C 6.5 (H) 07/16/2022    Your HGBA1c can be lowered with medications, healthy diet, and exercise. Check your blood sugar as directed by your physician Call your physician if you experience unexplained or low blood sugars.  PHYSICAL ACTIVITY/REHABILITATION Goal is 30 minutes at least 4 days per week  Activity: Increase activity slowly, Therapies: Physical Therapy: Home Health Return to work:  Activity decreases your risk of heart attack and stroke and makes your heart stronger.  It helps control your  weight and blood pressure; helps you relax and can improve your mood. Participate in a regular exercise program. Talk with your doctor about the best form of exercise for you (dancing, walking, swimming, cycling).  DIET/WEIGHT Goal is to maintain a healthy weight  Your discharge diet is:  Diet Order             Diet heart healthy/carb modified Room service appropriate? Yes; Fluid consistency: Thin  Diet effective now                   liquids Your height is:  Height: 5\' 9"  (175.3 cm) Your current weight is: Weight: 69.7 kg Your Body Mass Index (BMI) is:  BMI (Calculated): 22.68 Following the type of diet specifically designed for you will help prevent another  stroke. Your goal weight range is:   Your goal Body Mass Index (BMI) is 19-24. Healthy food habits can help reduce 3 risk factors for stroke:  High cholesterol, hypertension, and excess weight.  RESOURCES Stroke/Support Group:  Call [phone]   STROKE EDUCATION PROVIDED/REVIEWED AND GIVEN TO PATIENT Stroke warning signs and symptoms How to activate emergency medical system (call 911). Medications prescribed at discharge. Need for follow-up after discharge. Personal risk factors for stroke. Pneumonia vaccine given: No Flu vaccine given: No My questions have been answered, the writing is legible, and I understand these instructions.  I will adhere to these goals & educational materials that have been provided to me after my discharge from the hospital.     My questions have been answered and I understand these instructions. I will adhere to these goals and the provided educational materials after my discharge from the hospital.  Patient/Caregiver Signature _______________________________ Date __________  Clinician Signature _______________________________________ Date __________  Please bring this form and your medication list with you to all your follow-up doctor's appointments.

## 2022-08-23 NOTE — Plan of Care (Signed)
Problem: RH Balance Goal: LTG: Patient will maintain dynamic sitting balance (OT) Description: LTG:  Patient will maintain dynamic sitting balance with assistance during activities of daily living (OT) Flowsheets (Taken 08/23/2022 1241) LTG: Pt will maintain dynamic sitting balance during ADLs with: Supervision/Verbal cueing Goal: LTG Patient will maintain dynamic standing with ADLs (OT) Description: LTG:  Patient will maintain dynamic standing balance with assist during activities of daily living (OT)  Flowsheets (Taken 08/23/2022 1241) LTG: Pt will maintain dynamic standing balance during ADLs with: Minimal Assistance - Patient > 75%   Problem: Sit to Stand Goal: LTG:  Patient will perform sit to stand in prep for activites of daily living with assistance level (OT) Description: LTG:  Patient will perform sit to stand in prep for activites of daily living with assistance level (OT) Flowsheets (Taken 08/23/2022 1241) LTG: PT will perform sit to stand in prep for activites of daily living with assistance level: Supervision/Verbal cueing   Problem: RH Eating Goal: LTG Patient will perform eating w/assist, cues/equip (OT) Description: LTG: Patient will perform eating with assist, with/without cues using equipment (OT) Flowsheets (Taken 08/23/2022 1241) LTG: Pt will perform eating with assistance level of: Supervision/Verbal cueing   Problem: RH Grooming Goal: LTG Patient will perform grooming w/assist,cues/equip (OT) Description: LTG: Patient will perform grooming with assist, with/without cues using equipment (OT) Flowsheets (Taken 08/23/2022 1241) LTG: Pt will perform grooming with assistance level of: Supervision/Verbal cueing   Problem: RH Bathing Goal: LTG Patient will bathe all body parts with assist levels (OT) Description: LTG: Patient will bathe all body parts with assist levels (OT) Flowsheets (Taken 08/23/2022 1241) LTG: Pt will perform bathing with assistance level/cueing: Minimal  Assistance - Patient > 75% LTG: Position pt will perform bathing:  Shower  At sink   Problem: RH Dressing Goal: LTG Patient will perform upper body dressing (OT) Description: LTG Patient will perform upper body dressing with assist, with/without cues (OT). Flowsheets (Taken 08/23/2022 1241) LTG: Pt will perform upper body dressing with assistance level of: Supervision/Verbal cueing Goal: LTG Patient will perform lower body dressing w/assist (OT) Description: LTG: Patient will perform lower body dressing with assist, with/without cues in positioning using equipment (OT) Flowsheets (Taken 08/23/2022 1241) LTG: Pt will perform lower body dressing with assistance level of: Minimal Assistance - Patient > 75%   Problem: RH Toileting Goal: LTG Patient will perform toileting task (3/3 steps) with assistance level (OT) Description: LTG: Patient will perform toileting task (3/3 steps) with assistance level (OT)  Flowsheets (Taken 08/23/2022 1241) LTG: Pt will perform toileting task (3/3 steps) with assistance level: Minimal Assistance - Patient > 75%   Problem: RH Functional Use of Upper Extremity Goal: LTG Patient will use RT/LT upper extremity as a (OT) Description: LTG: Patient will use right/left upper extremity as a stabilizer/gross assist/diminished/nondominant/dominant level with assist, with/without cues during functional activity (OT) Flowsheets (Taken 08/23/2022 1241) LTG: Use of upper extremity in functional activities: LUE as gross assist level LTG: Pt will use upper extremity in functional activity with assistance level of: Minimal Assistance - Patient > 75%   Problem: RH Toilet Transfers Goal: LTG Patient will perform toilet transfers w/assist (OT) Description: LTG: Patient will perform toilet transfers with assist, with/without cues using equipment (OT) Flowsheets (Taken 08/23/2022 1241) LTG: Pt will perform toilet transfers with assistance level of: Minimal Assistance - Patient > 75%    Problem: RH Tub/Shower Transfers Goal: LTG Patient will perform tub/shower transfers w/assist (OT) Description: LTG: Patient will perform tub/shower transfers with assist,  with/without cues using equipment (OT) Flowsheets (Taken 08/23/2022 1241) LTG: Pt will perform tub/shower stall transfers with assistance level of: Minimal Assistance - Patient > 75% LTG: Pt will perform tub/shower transfers from: Tub/shower combination   Problem: RH Attention Goal: LTG Patient will demonstrate this level of attention during functional activites (OT) Description: LTG:  Patient will demonstrate this level of attention during functional activites  (OT) Flowsheets (Taken 08/23/2022 1241) Patient will demonstrate this level of attention during functional activites: Sustained Patient will demonstrate above attention level in the following environment: Home LTG: Patient will demonstrate this level of attention during functional activites (OT): Minimal Assistance - Patient > 75%

## 2022-08-23 NOTE — Progress Notes (Signed)
Inpatient Rehabilitation  Patient information reviewed and entered into eRehab system by Letrell Attwood Trana Ressler, OTR/L, Rehab Quality Coordinator.   Information including medical coding, functional ability and quality indicators will be reviewed and updated through discharge.   

## 2022-08-23 NOTE — Progress Notes (Signed)
Inpatient Rehabilitation Care Coordinator Assessment and Plan Patient Details  Name: Mitchell Washington MRN: [id number] Date of Birth: 10-02-1950  Today's Date: 08/23/2022  Hospital Problems: Principal Problem:   Right pontine cerebrovascular accident [facility])  Past Medical History:  Past Medical History:  Diagnosis Date   CVA (cerebral infarction)    Diabetes mellitus without complication ([place])    Gout    Hyperlipidemia    Hypertension    Memory loss    Stroke [facility])    Wears dentures    partial upper   Past Surgical History:  Past Surgical History:  Procedure Laterality Date   APPENDECTOMY     CATARACT EXTRACTION W/PHACO Left 10/25/2019   Procedure: CATARACT EXTRACTION PHACO AND INTRAOCULAR LENS PLACEMENT (IOC) LEFT DIABETIC INTRAVEITREAL KENALOG INJECTION 1.93  00:22.6;  Surgeon: Eulogio Bear, MD;  Location: [place];  Service: Ophthalmology;  Laterality: Left;  Diabetic - oral meds   CATARACT EXTRACTION W/PHACO Right 11/15/2019   Procedure: CATARACT EXTRACTION PHACO AND INTRAOCULAR LENS PLACEMENT ([place]) RIGHT DIABETIC;  Surgeon: Eulogio Bear, MD;  Location: [place];  Service: Ophthalmology;  Laterality: Right;  0.92 0 ;19.7   IR ANGIO INTRA EXTRACRAN SEL COM CAROTID INNOMINATE BILAT MOD SED  07/16/2022   IR ANGIO VERTEBRAL SEL SUBCLAVIAN INNOMINATE UNI R MOD SED  07/16/2022   IR ANGIO VERTEBRAL SEL VERTEBRAL UNI L MOD SED  07/16/2022   IR ANGIO VERTEBRAL SEL VERTEBRAL UNI L MOD SED  07/22/2022   IR CT HEAD LTD  07/18/2022   IR PTA INTRACRANIAL  07/18/2022   RADIOLOGY WITH ANESTHESIA N/A 07/18/2022   Procedure: Cerebral angioplasty with possible stenting;  Surgeon: Luanne Bras, MD;  Location: [place];  Service: Radiology;  Laterality: N/A;   Social History:  reports that he has quit smoking. He has never used smokeless tobacco. He reports that he does not currently use alcohol. He reports that he does not use drugs.  Family / Support  Systems Marital Status: Married Patient Roles: Spouse, Parent Spouse/Significant Other: [phone] Children: Daughter who is local but works nights Other Supports: Church members and friends Anticipated Caregiver: Mitchell Washington Family Dynamics: Close with family and exteneded family. He has good supports via community. Wife needs him to be able to ambulate so better able to care for at home  Social History Preferred language: English Religion: Unknown Cultural Background: No issues Education: Physiological scientist - How often do you need to have someone help you when you read instructions, pamphlets, or other written material from your doctor or pharmacy?: Patient unable to respond Writes: Yes Employment Status: Retired Public relations account executive Issues: No issues Guardian/Conservator: None-according to MD pt is not fully capable of making his own decisions while here, will look toward his wife to make any decisions while here   Abuse/Neglect Abuse/Neglect Assessment Can Be Completed: Yes Physical Abuse: Denies Verbal Abuse: Denies Sexual Abuse: Denies Exploitation of patient/patient's resources: Denies Self-Neglect: Denies  Patient response to: Social Isolation - How often do you feel lonely or isolated from those around you?: Never  Emotional Status Pt's affect, behavior and adjustment status: Pt can not remember being in last month, his wife is present and answers the questions for him. Pt is confused due to new environment but looks toward his wife for comfort and reassurance. Wife is hopeful can get mobile again she assists with ADL's Recent Psychosocial Issues: Hx-CVA's and  dementia Psychiatric History: No issues Substance Abuse History: NA  Patient / Family Perceptions, Expectations & Goals Pt/Family understanding of illness &  functional limitations: Wife stays here with him and is aware of his treatment plan moving forward she talks with the MD and feels she has a good understanding of his issues and plans. 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 Wife states: " I hope he will do well again and become mobile before going home."  [employer]: None Premorbid Home Care/DME Agencies: Other (Comment) (has rw, transport chair, [facility] was following PTA will resume) Transportation available at discharge: wife Is the patient able to respond to transportation needs?: Yes In the past 12 months, has lack of transportation kept you from medical appointments or from getting medications?: No In the past 12 months, has lack of transportation kept you from meetings, work, or from getting things needed for daily living?: No  Discharge Planning Living Arrangements: Spouse/significant other Support Systems: Spouse/significant other, Children, Friends/neighbors, Other relatives, Church/faith community Type of Residence: Private residence Insurance Resources: Multimedia programmer (specify) Personnel officer Medicare) Financial Resources: Social Security, Family Support Financial Screen Referred: No Living Expenses: Own Money Management: Spouse Does the patient have any problems obtaining your medications?: No Home Management: wife Patient/Family Preliminary Plans: Return home with wife who has been assisting him prior to [place] CVA in 06/2022. Familar with rehab center just here in 07/2022. HOpefully will do well here again Care Coordinator Barriers to Discharge: Insurance for SNF coverage, Other (comments) Care Coordinator Barriers to Discharge Comments: dementia and STM issues Care Coordinator Anticipated Follow Up Needs:  HH/OP  Clinical Impression Pleasant gentleman who did not participate in my assessment wife answered all of the questions. Familiar with both due to just here in 07/2022 after another CVA. Wife stays with pt due to his dementia will work with her on discharge needs. Aware being evaluated today and goals being set.  Mitchell Washington 08/23/2022, 8:45 AM

## 2022-08-23 NOTE — Plan of Care (Signed)
  Problem: RH Expression Communication Goal: LTG Patient will increase speech intelligibility (SLP) Description: LTG: Patient will increase speech intelligibility at word/phrase/conversation level with cues, % of the time (SLP) Flowsheets (Taken 08/23/2022 2103) LTG: Patient will increase speech intelligibility (SLP): Minimal Assistance - Patient > 75% Level: Phrase Percent of time patient will use intelligible speech: 80

## 2022-08-23 NOTE — Plan of Care (Signed)
  Problem: RH Balance Goal: LTG Patient will maintain dynamic standing balance (PT) Description: LTG:  Patient will maintain dynamic standing balance with assistance during mobility activities (PT) Flowsheets (Taken 08/23/2022 0906) LTG: Pt will maintain dynamic standing balance during mobility activities with:: Minimal Assistance - Patient > 75%   Problem: Sit to Stand Goal: LTG:  Patient will perform sit to stand with assistance level (PT) Description: LTG:  Patient will perform sit to stand with assistance level (PT) Flowsheets (Taken 08/23/2022 0906) LTG: PT will perform sit to stand in preparation for functional mobility with assistance level: Minimal Assistance - Patient > 75%   Problem: RH Bed Mobility Goal: LTG Patient will perform bed mobility with assist (PT) Description: LTG: Patient will perform bed mobility with assistance, with/without cues (PT). Flowsheets (Taken 08/23/2022 0906) LTG: Pt will perform bed mobility with assistance level of: Minimal Assistance - Patient > 75%   Problem: RH Bed to Chair Transfers Goal: LTG Patient will perform bed/chair transfers w/assist (PT) Description: LTG: Patient will perform bed to chair transfers with assistance (PT). Flowsheets (Taken 08/23/2022 0906) LTG: Pt will perform Bed to Chair Transfers with assistance level: Minimal Assistance - Patient > 75%   Problem: RH Car Transfers Goal: LTG Patient will perform car transfers with assist (PT) Description: LTG: Patient will perform car transfers with assistance (PT). Flowsheets (Taken 08/23/2022 0906) LTG: Pt will perform car transfers with assist:: Minimal Assistance - Patient > 75%   Problem: RH Ambulation Goal: LTG Patient will ambulate in controlled environment (PT) Description: LTG: Patient will ambulate in a controlled environment, # of feet with assistance (PT). Flowsheets (Taken 08/23/2022 0906) LTG: Pt will ambulate in controlled environ  assist needed:: Minimal Assistance - Patient >  75% LTG: Ambulation distance in controlled environment: 53ft Goal: LTG Patient will ambulate in home environment (PT) Description: LTG: Patient will ambulate in home environment, # of feet with assistance (PT). Flowsheets (Taken 08/23/2022 0906) LTG: Pt will ambulate in home environ  assist needed:: Minimal Assistance - Patient > 75% LTG: Ambulation distance in home environment: 44ft   Problem: RH Stairs Goal: LTG Patient will ambulate up and down stairs w/assist (PT) Description: LTG: Patient will ambulate up and down # of stairs with assistance (PT) Flowsheets (Taken 08/23/2022 0906) LTG: Pt will ambulate up/down stairs assist needed:: Minimal Assistance - Patient > 75% LTG: Pt will  ambulate up and down number of stairs: 3 with 2 rails, per home setup

## 2022-08-23 NOTE — Evaluation (Signed)
Physical Therapy Assessment and Plan  Patient Details  Name: Mitchell Washington MRN: [id number] Date of Birth: [birth year]  PT Diagnosis: Abnormal posture, Abnormality of gait, Cognitive deficits, Difficulty walking, Hemiplegia non-dominant, Hypertonia, Impaired cognition, and Muscle weakness Rehab Potential: Fair ELOS: 10-12 days   Today's Date: 08/23/2022 PT Individual Time: 0800-0912 PT Individual Time Calculation (min): 72 min    Hospital Problem: Principal Problem:   Right pontine cerebrovascular accident [facility])   Past Medical History:  Past Medical History:  Diagnosis Date   CVA (cerebral infarction)    Diabetes mellitus without complication ([place])    Gout    Hyperlipidemia    Hypertension    Memory loss    Stroke [facility])    Wears dentures    partial upper   Past Surgical History:  Past Surgical History:  Procedure Laterality Date   APPENDECTOMY     CATARACT EXTRACTION W/PHACO Left 10/25/2019   Procedure: CATARACT EXTRACTION PHACO AND INTRAOCULAR LENS PLACEMENT ([place]) LEFT DIABETIC INTRAVEITREAL KENALOG INJECTION 1.93  00:22.6;  Surgeon: Eulogio Bear, MD;  Location: [employer];  Service: Ophthalmology;  Laterality: Left;  Diabetic - oral meds   CATARACT EXTRACTION W/PHACO Right 11/15/2019   Procedure: CATARACT EXTRACTION PHACO AND INTRAOCULAR LENS PLACEMENT ([place]) RIGHT DIABETIC;  Surgeon: Eulogio Bear, MD;  Location: [place];  Service: Ophthalmology;  Laterality: Right;  0.92 0 ;19.7   IR ANGIO INTRA EXTRACRAN SEL COM CAROTID INNOMINATE BILAT MOD SED  07/16/2022   IR ANGIO VERTEBRAL SEL SUBCLAVIAN INNOMINATE UNI R MOD SED  07/16/2022   IR ANGIO VERTEBRAL SEL VERTEBRAL UNI L MOD SED  07/16/2022   IR ANGIO VERTEBRAL SEL VERTEBRAL UNI L MOD SED  07/22/2022   IR CT HEAD LTD  07/18/2022   IR PTA INTRACRANIAL  07/18/2022   RADIOLOGY WITH ANESTHESIA N/A 07/18/2022   Procedure: Cerebral angioplasty with possible stenting;  Surgeon: Luanne Bras, MD;   Location: [place];  Service: Radiology;  Laterality: N/A;    Assessment & Plan Clinical Impression: Patient is a 72 year old right-handed male with history of diabetes mellitus, CKD stage III with baseline creatinine 1.38-1.86, hypertension, dementia maintained on Aricept as well as Namenda, 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  Presented 08/14/2022 to [facility] hospital with acute onset of left-sided weakness as well as hypotension.  CT/MRI showed slight interval expansion of the previously seen right pontine infarction, slightly increased in size compared to previous with further posterior extension towards the floor of the fourth ventricle.  No associated hemorrhage or mass effect.  Previously seen scattered small cerebellar infarcts have largely resolved.  No other acute intracranial abnormality.  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  Superimposed significant bilateral supraclinoid ICA atherosclerotic stenosis also redemonstrated and stable.  Recent echocardiogram with ejection fraction of 60 to 65% no wall motion abnormalities.  Admission chemistries unremarkable except potassium 5.4 BUN 57 creatinine 2.75.  Neurology follow-up patient currently remains on aspirin and Brilinta as prior to admission.  Lovenox added for DVT prophylaxis.  AKI on CKD stage III elevated creatinine felt to be related to hypotension received IV fluids with latest creatinine 1.58.  He is tolerating a regular consistency diet.  Therapy evaluations completed and pt was recommended for a comprehensive rehab program. Patient transferred to CIR on  08/22/2022 .   Patient currently requires mod with mobility secondary to muscle  weakness and muscle joint tightness, decreased cardiorespiratoy endurance, abnormal tone, unbalanced muscle activation, and motor apraxia, decreased initiation, decreased attention, decreased awareness, decreased problem solving, decreased safety awareness, decreased memory, and delayed processing, and decreased sitting balance, decreased standing balance, decreased postural control, hemiplegia, and decreased balance strategies.  Prior to hospitalization, patient was min with mobility and lived with Spouse in a House home.  Home access is 3Stairs to enter.  Patient will benefit from skilled PT intervention to maximize safe functional mobility, minimize fall risk, and decrease caregiver burden for planned discharge home with 24 hour assist.  Anticipate patient will benefit from follow up [facility] at discharge.  PT - End of Session Activity Tolerance: Tolerates < 10 min activity, no significant change in vital signs Endurance Deficit: Yes Endurance Deficit Description: multiple rest breaks needed PT Assessment Rehab Potential (ACUTE/IP ONLY): Fair PT Barriers to Discharge: Decreased caregiver support;Home environment access/layout;Lack of/limited family support;Insurance for SNF coverage;Behavior PT Patient demonstrates impairments in the following area(s): Balance;Behavior;Endurance;Motor;Perception;Safety PT Transfers Functional Problem(s): Bed Mobility;Bed to Chair;Car PT Locomotion Functional Problem(s): Ambulation;Stairs PT Plan PT Intensity: Minimum of 1-2 x/day ,45 to 90 minutes PT Frequency: 5 out of 7 days PT Duration Estimated Length of Stay: 7-9 days PT Treatment/Interventions: Ambulation/gait training;Balance/vestibular training;Cognitive remediation/compensation;Community reintegration;Discharge planning;Disease management/prevention;DME/adaptive equipment instruction;Functional electrical stimulation;Functional mobility training;Neuromuscular re-education;Pain management;Patient/family  education;Psychosocial support;Splinting/orthotics;Stair training;Therapeutic Activities;Therapeutic Exercise;UE/LE Strength taining/ROM;UE/LE Coordination activities;Visual/perceptual remediation/compensation;Wheelchair propulsion/positioning PT Transfers Anticipated Outcome(s): minA PT Locomotion Anticipated Outcome(s): minA PT Recommendation Follow Up Recommendations: Home health PT;24 hour supervision/assistance Patient destination: Home Equipment Recommended: To be determined   PT Evaluation Precautions/Restrictions Precautions Precautions: Fall Precaution Comments: L hemiparesis, dementia Restrictions Weight Bearing Restrictions: No General   Vital Signs Pain Pain Assessment Pain Scale: 0-10 Pain Score: 0-No pain Pain Interference Pain Interference Pain Effect on Sleep: 0. Does not apply - I have not had any pain or hurting in the past 5 days Pain Interference with Therapy Activities: 0. Does not apply - I have not received rehabilitationtherapy in the past 5 days Pain Interference with Day-to-Day Activities: 1. Rarely or not at all Home Living/Prior [place]: Spouse/significant other Available Help at Discharge: Family;Available 24 hours/day Type of Home: House Home Access: Stairs to enter [employer] of Steps: 3 Entrance Stairs-Rails: Can reach both;Left;Right Home Layout: One level Bathroom Shower/Tub: Multimedia programmer: Standard Bathroom Accessibility: Yes  Lives With: Spouse Prior Function Level of Independence: Needs assistance with ADLs;Needs assistance with homemaking;Requires assistive device for independence;Needs assistance with gait;Needs assistance with tranfers  Able to Take Stairs?: Yes Driving: No Vocation: Retired Vision/Perception  Vision - History Ability to See in Adequate Light: 0 Adequate Perception Perception: Impaired Inattention/Neglect: Does not attend to left side of  body Praxis Praxis: Impaired Praxis Impairment Details: Motor planning;Initiation  Cognition Overall Cognitive Status: History of cognitive impairments - at baseline Arousal/Alertness: Awake/alert Orientation Level: Oriented to person;Oriented to place;Disoriented to time;Disoriented to situation ("bad shape" for situation. "hospital" for place) Year: 2023 Month: January Day of Week: Incorrect Focused Attention: Appears intact Sustained Attention: Impaired Sustained Attention Impairment: Verbal basic;Functional basic Selective Attention: Impaired Selective Attention Impairment: Verbal basic;Functional basic Memory: Impaired Memory Impairment: Decreased short term memory;Decreased recall of new information;Retrieval deficit;Storage deficit Decreased Short Term Memory: Verbal basic;Functional basic Awareness: Impaired Awareness Impairment: Emergent impairment;Intellectual impairment Problem Solving: Impaired Problem Solving Impairment: Functional basic;Verbal basic Safety/Judgment: Impaired Comments: Decreased insight and awareness Sensation Sensation Light  Touch: Appears Intact Hot/Cold: Appears Intact Proprioception: Impaired by gross assessment Stereognosis: Not tested Coordination Gross Motor Movements are Fluid and Coordinated: No Coordination and Movement Description: left hemi, generalized weakness/deconditioning Heel Shin Test: unable on L due to functional weakness Motor  Motor Motor: Hemiplegia;Motor apraxia;Abnormal postural alignment and control;Abnormal tone Motor - Skilled Clinical Observations: L hemi (UE > LE)   Trunk/Postural Assessment  Cervical Assessment Cervical Assessment: Exceptions to [facility] (forward head) Thoracic Assessment Thoracic Assessment: Exceptions to [facility] (rounded shoulders) Lumbar Assessment Lumbar Assessment: Exceptions to [facility] (posterior tilt) Postural Control Postural Control: Deficits on evaluation Trunk Control: posterior bias, flexed  trunk Righting Reactions: delayed and insufficient  Balance Balance Balance Assessed: Yes Static Sitting Balance Static Sitting - Balance Support: No upper extremity supported;Feet supported Static Sitting - Level of Assistance: 5: Stand by assistance Dynamic Sitting Balance Dynamic Sitting - Balance Support: During functional activity;No upper extremity supported Dynamic Sitting - Level of Assistance: 4: Min Insurance risk surveyor Standing - Balance Support: No upper extremity supported Static Standing - Level of Assistance: 4: Min assist Dynamic Standing Balance Dynamic Standing - Balance Support: During functional activity;No upper extremity supported Dynamic Standing - Level of Assistance: 3: Mod assist;2: Max assist Dynamic Standing - Balance Activities: Reaching for objects;Lateral lean/weight shifting;Forward lean/weight shifting Extremity Assessment      RLE Assessment RLE Assessment: Exceptions to [facility] General Strength Comments: Grossly 4-/5 LLE Assessment LLE Assessment: Exceptions to [facility] LLE Strength LLE Overall Strength: Deficits Left Hip Flexion: 2-/5 Left Hip ABduction: 2/5 Left Hip ADduction: 2/5 Left Knee Flexion: 2/5 Left Knee Extension: 2+/5 Left Ankle Dorsiflexion: 3/5  Care Tool Care Tool Bed Mobility Roll left and right activity   Roll left and right assist level: Moderate Assistance - Patient 50 - 74%    Sit to lying activity   Sit to lying assist level: Moderate Assistance - Patient 50 - 74%    Lying to sitting on side of bed activity   Lying to sitting on side of bed assist level: the ability to move from lying on the back to sitting on the side of the bed with no back support.: Moderate Assistance - Patient 50 - 74%     Care Tool Transfers Sit to stand transfer   Sit to stand assist level: Moderate Assistance - Patient 50 - 74%    Chair/bed transfer   Chair/bed transfer assist level: Moderate Assistance - Patient 50 - 74%      Physiological scientist transfer assist level: Moderate Assistance - Patient 50 - 74%      Care Tool Locomotion Ambulation   Assist level: Minimal Assistance - Patient > 75% Assistive device: Parallel bars Max distance: 72ft  Walk 10 feet activity Walk 10 feet activity did not occur: Safety/medical concerns       Walk 50 feet with 2 turns activity Walk 50 feet with 2 turns activity did not occur: Safety/medical concerns      Walk 150 feet activity Walk 150 feet activity did not occur: Safety/medical concerns      Walk 10 feet on uneven surfaces activity Walk 10 feet on uneven surfaces activity did not occur: Safety/medical concerns      Stairs   Assist level: Moderate Assistance - Patient - 50 - 74% Stairs assistive device: 2 hand rails Max number of stairs: 4  Walk up/down 1 step activity   Walk up/down 1 step (curb) assist level:  Moderate Assistance - Patient - 50 - 74% Walk up/down 1 step or curb assistive device: 2 hand rails  Walk up/down 4 steps activity   Walk up/down 4 steps assist level: Moderate Assistance - Patient - 50 - 74% Walk up/down 4 steps assistive device: 2 hand rails  Walk up/down 12 steps activity Walk up/down 12 steps activity did not occur: Safety/medical concerns (fatigue)      Pick up small objects from floor Pick up small object from the floor (from standing position) activity did not occur: Safety/medical concerns      Wheelchair Is the patient using a wheelchair?: Yes Type of Wheelchair: Manual   Wheelchair assist level: Dependent - Patient 0%    Wheel 50 feet with 2 turns activity   Assist Level: Dependent - Patient 0%  Wheel 150 feet activity   Assist Level: Dependent - Patient 0%    Refer to Care Plan for Long Term Goals  SHORT TERM GOAL WEEK 1 PT Short Term Goal 1 (Week 1): STG - LTG due to ELOS  Recommendations for other services: None   Skilled Therapeutic Intervention Mobility Bed Mobility Bed  Mobility: Rolling Right;Rolling Left;Supine to Sit;Sit to Supine Rolling Right: Moderate Assistance - Patient 50-74% Rolling Left: Supervision/Verbal cueing Supine to Sit: Moderate Assistance - Patient 50-74% Sit to Supine: Moderate Assistance - Patient 50-74% Transfers Transfers: Sit to Stand;Stand to Sit;Stand Pivot Transfers Sit to Stand: Moderate Assistance - Patient 50-74% Stand to Sit: Moderate Assistance - Patient 50-74% Stand Pivot Transfers: Moderate Assistance - Patient 50 - 74% Stand Pivot Transfer Details: Tactile cues for initiation;Tactile cues for sequencing;Tactile cues for weight shifting;Tactile cues for posture;Verbal cues for technique;Verbal cues for sequencing;Visual cues/gestures for sequencing;Verbal cues for gait pattern;Verbal cues for precautions/safety;Manual facilitation for weight shifting;Manual facilitation for placement Transfer (Assistive device): 1 person hand held assist Locomotion  Gait Ambulation: Yes Gait Assistance: Minimal Assistance - Patient > 75% Gait Distance (Feet): 8 Feet Assistive device: Parallel bars Gait Assistance Details: Tactile cues for weight shifting;Tactile cues for sequencing;Tactile cues for initiation;Tactile cues for posture;Tactile cues for placement;Verbal cues for precautions/safety;Verbal cues for technique;Verbal cues for safe use of DME/AE;Verbal cues for gait pattern;Verbal cues for sequencing;Manual facilitation for weight shifting Gait Gait: Yes Gait Pattern: Impaired Gait Pattern: Decreased stance time - left;Decreased stride length;Decreased hip/knee flexion - left;Decreased dorsiflexion - left;Decreased weight shift to left;Left flexed knee in stance;Step-to pattern;Narrow base of support;Poor foot clearance - left;Trunk flexed Gait velocity: decreased Stairs / Additional Locomotion Stairs: Yes Stairs Assistance: Moderate Assistance - Patient 50 - 74% Stair Management Technique: Two rails Number of Stairs: 4 Height  of Stairs: 6 Wheelchair Mobility Wheelchair Assistance: Dependent - Patient 0%   Skilled Intervention: Pt presents supine in bed - awake and agreeable to PT tx. Denies pain. Initiated functional mobility as outlined above. Pt 1/2 dressed with shirt only - assisted donning pants at bed level. Pt needing maxA for LB dressing. Rolling in bed to his L with supervision but needing modA for rolling R. Supine<>sitting EOB with modA for trunk support with HOB flat and no bed rails. Fair static sitting balance but needs minA for dynamic sitting balance due to posterior bias. Stand<>pivot transfers completed with modA and no AD. Requires modA for sit<>stand in // bars and minA for short distance gait in // bars. Unable to sustain L grip onto // bar. Stair training completed with modA - most difficulty descending steps and patient fearful of falling. Pt presents with L sided weakness, abnormal  tone, impaired cognition, L sided inattention, decreased initiation, and impaired awareness/insight. Pt with fair- rehab potential, limited by motivation, self drive, baseline dementia, age, and PLOF. Session ended in bed with alarm on, call bell in reach, and all needs met.   Discharge Criteria: Patient will be discharged from PT if patient refuses treatment 3 consecutive times without medical reason, if treatment goals not met, if there is a change in medical status, if patient makes no progress towards goals or if patient is discharged from hospital.  The above assessment, treatment plan, treatment alternatives and goals were discussed and mutually agreed upon: by patient  [name] PT, DPT 08/23/2022, 9:03 AM

## 2022-08-24 DIAGNOSIS — I6350 Cerebral infarction due to unspecified occlusion or stenosis of unspecified cerebral artery: Secondary | ICD-10-CM | POA: Diagnosis not present

## 2022-08-24 LAB — GLUCOSE, CAPILLARY
Glucose-Capillary: 119 mg/dL — ABNORMAL HIGH (ref 70–99)
Glucose-Capillary: 170 mg/dL — ABNORMAL HIGH (ref 70–99)
Glucose-Capillary: 115 mg/dL — ABNORMAL HIGH (ref 70–99)
Glucose-Capillary: 189 mg/dL — ABNORMAL HIGH (ref 70–99)

## 2022-08-24 LAB — VITAMIN D 25 HYDROXY (VIT D DEFICIENCY, FRACTURES): Vit D, 25-Hydroxy: 22.04 ng/mL — ABNORMAL LOW (ref 30–100)

## 2022-08-24 MED ORDER — TRAMADOL HCL 50 MG PO TABS
50.0000 mg | ORAL_TABLET | Freq: Four times a day (QID) | ORAL | Status: DC | PRN
  Administered 2022-08-24 – 2022-08-28 (×3): 50 mg via ORAL
  Filled 2022-08-24 (×3): qty 1

## 2022-08-24 NOTE — Progress Notes (Signed)
PROGRESS NOTE   Subjective/Complaints: Sleepy this morning Patient's chart reviewed- No issues reported overnight Vitals signs stable   ROS: unable to obtain due to somnolence   Objective:   No results found. Recent Labs    08/22/22 1732 08/23/22 0607  WBC 6.3 6.5  HGB 11.7* 12.3*  HCT 36.4* 36.7*  PLT 350 346   Recent Labs    08/22/22 1732 08/23/22 0607  NA  --  139  K  --  4.6  CL  --  105  CO2  --  23  GLUCOSE  --  145*  BUN  --  29*  CREATININE 1.68* 1.58*  CALCIUM  --  9.1    Intake/Output Summary (Last 24 hours) at 08/24/2022 1646 Last data filed at 08/24/2022 1400 Gross per 24 hour  Intake 356 ml  Output 300 ml  Net 56 ml        Physical Exam: Vital Signs Blood pressure 114/60, pulse 71, temperature (!) 97.4 F (36.3 C), temperature source Oral, resp. rate 16, height 5\' 9"  (1.753 m), weight 69.7 kg, SpO2 97 %. Gen: no distress, normal appearing HEENT: oral mucosa pink and moist, NCAT Cardio: Reg rate Chest: normal effort, normal rate of breathing Abd: soft, non-distended Ext: no edema Psych: pleasant, normal affect Skin: intact Neuro: Patient is alert makes eye contact with examiner.  Follows simple commands.  Oriented to state but not city.  He can provide the month but not appropriate year. LUE 3/5 strength, 4/5 throughout otherwise   Assessment/Plan: 1. Functional deficits which require 3+ hours per day of interdisciplinary therapy in a comprehensive inpatient rehab setting. Physiatrist is providing close team supervision and 24 hour management of active medical problems listed below. Physiatrist and rehab team continue to assess barriers to discharge/monitor patient progress toward functional and medical goals  Care Tool:  Bathing    Body parts bathed by patient: Face, Chest, Abdomen, Left upper leg, Right upper leg   Body parts bathed by helper: Right arm, Left arm, Front perineal  area, Buttocks, Left lower leg, Right lower leg     Bathing assist Assist Level: Moderate Assistance - Patient 50 - 74%     Upper Body Dressing/Undressing Upper body dressing   What is the patient wearing?: Pull over shirt    Upper body assist Assist Level: Moderate Assistance - Patient 50 - 74%    Lower Body Dressing/Undressing Lower body dressing      What is the patient wearing?: Pants, Incontinence brief     Lower body assist Assist for lower body dressing: Maximal Assistance - Patient 25 - 49%     Toileting Toileting    Toileting assist Assist for toileting: Total Assistance - Patient < 25%     Transfers Chair/bed transfer  Transfers assist     Chair/bed transfer assist level: Moderate Assistance - Patient 50 - 74%     Locomotion Ambulation   Ambulation assist      Assist level: Minimal Assistance - Patient > 75% Assistive device: Parallel bars Max distance: 63ft   Walk 10 feet activity   Assist  Walk 10 feet activity did not occur: Safety/medical concerns  Walk 50 feet activity   Assist Walk 50 feet with 2 turns activity did not occur: Safety/medical concerns         Walk 150 feet activity   Assist Walk 150 feet activity did not occur: Safety/medical concerns         Walk 10 feet on uneven surface  activity   Assist Walk 10 feet on uneven surfaces activity did not occur: Safety/medical concerns         Wheelchair     Assist Is the patient using a wheelchair?: Yes Type of Wheelchair: Manual    Wheelchair assist level: Dependent - Patient 0%      Wheelchair 50 feet with 2 turns activity    Assist        Assist Level: Dependent - Patient 0%   Wheelchair 150 feet activity     Assist      Assist Level: Dependent - Patient 0%   Blood pressure 114/60, pulse 71, temperature (!) 97.4 F (36.3 C), temperature source Oral, resp. rate 16, height 5\' 9"  (1.753 m), weight 69.7 kg, SpO2 97 %.     Medical Problem List and Plan: 1. 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  MRI 12/27 slight interval expansion of previously an acute right pontine infarction             -patient may shower             -ELOS/Goals: 10-14 days             Continue CIR 2.  Antithrombotics: -DVT/anticoagulation:  Pharmaceutical: Lovenox             -antiplatelet therapy: Aspirin 81 mg daily and Brilinta 90 mg twice daily 3. Pain Management: Tylenol as needed 4. Dementia: continue Aricept 10 mg nightly, Namenda 10 mg twice daily             -antipsychotic agents: N/A 5. Neuropsych/cognition: This patient is not capable of making decisions on his own behalf. 6. Skin/Wound Care: Routine skin checks 7. Fluids/Electrolytes/Nutrition: Routine in and outs with follow-up chemistries 8.  Hypertension.  Continue Norvasc 2.5 mg daily, lisinopril 5 mg daily.  Monitor with increased mobility 9.  Hyperlipidemia.  Lipitor, LDL reviewed and is 28.  10.  AKI on CKD stage III.  AKI felt to be related to hypotension.  Follow-up chemistries.  Baseline creatinine 1.38-1.86. Cr reviewed and is 1.58.  11.  Diabetes mellitus.  Hemoglobin A1c 6.5.  Resume glipizide 2.5 mg daily. Magnesium reviewed and is normal.  12. Vitamin D deficiency: start ergocalciferol 50,000U once per week  LOS: 2 days A FACE TO FACE EVALUATION WAS PERFORMED  Clide Deutscher Mitchell Washington 08/24/2022, 4:46 PM

## 2022-08-25 DIAGNOSIS — I6350 Cerebral infarction due to unspecified occlusion or stenosis of unspecified cerebral artery: Secondary | ICD-10-CM | POA: Diagnosis not present

## 2022-08-25 LAB — GLUCOSE, CAPILLARY
Glucose-Capillary: 130 mg/dL — ABNORMAL HIGH (ref 70–99)
Glucose-Capillary: 150 mg/dL — ABNORMAL HIGH (ref 70–99)
Glucose-Capillary: 151 mg/dL — ABNORMAL HIGH (ref 70–99)
Glucose-Capillary: 115 mg/dL — ABNORMAL HIGH (ref 70–99)

## 2022-08-25 MED ORDER — GLIPIZIDE 5 MG PO TABS
2.5000 mg | ORAL_TABLET | Freq: Every day | ORAL | Status: DC
  Administered 2022-08-25 – 2022-09-03 (×10): 2.5 mg via ORAL
  Filled 2022-08-25 (×10): qty 0.5

## 2022-08-25 NOTE — Progress Notes (Signed)
PROGRESS NOTE   Subjective/Complaints: Feeling well this morning No new concerns from [facility] OT who is working with him, though she notes continued fatigue  ROS: +left hip pain   Objective:   No results found. Recent Labs    08/22/22 1732 08/23/22 0607  WBC 6.3 6.5  HGB 11.7* 12.3*  HCT 36.4* 36.7*  PLT 350 346   Recent Labs    08/22/22 1732 08/23/22 0607  NA  --  139  K  --  4.6  CL  --  105  CO2  --  23  GLUCOSE  --  145*  BUN  --  29*  CREATININE 1.68* 1.58*  CALCIUM  --  9.1    Intake/Output Summary (Last 24 hours) at 08/25/2022 1146 Last data filed at 08/25/2022 0758 Gross per 24 hour  Intake 360 ml  Output 300 ml  Net 60 ml        Physical Exam: Vital Signs Blood pressure (!) 146/46, pulse 64, temperature 97.8 F (36.6 C), temperature source Oral, resp. rate 18, height 5\' 9"  (1.753 m), weight 69.7 kg, SpO2 98 %. Gen: no distress, normal appearing HEENT: oral mucosa pink and moist, NCAT Cardio: Reg rate Chest: normal effort, normal rate of breathing Abd: soft, non-distended Ext: no edema Psych: pleasant, normal affect Skin: intact Neuro: Patient is alert makes eye contact with examiner.  Follows simple commands.  Oriented to state but not city.  He can provide the month but not appropriate year. LUE 3/5 strength, 4/5 throughout otherwise  MSK: left hip TTP  Assessment/Plan: 1. Functional deficits which require 3+ hours per day of interdisciplinary therapy in a comprehensive inpatient rehab setting. Physiatrist is providing close team supervision and 24 hour management of active medical problems listed below. Physiatrist and rehab team continue to assess barriers to discharge/monitor patient progress toward functional and medical goals  Care Tool:  Bathing    Body parts bathed by patient: Face, Chest, Abdomen, Left upper leg, Right upper leg, Left arm   Body parts bathed by helper: Right arm,  Right lower leg, Left lower leg, Buttocks     Bathing assist Assist Level: Moderate Assistance - Patient 50 - 74%     Upper Body Dressing/Undressing Upper body dressing   What is the patient wearing?: Pull over shirt    Upper body assist Assist Level: Moderate Assistance - Patient 50 - 74%    Lower Body Dressing/Undressing Lower body dressing      What is the patient wearing?: Incontinence brief, Pants     Lower body assist Assist for lower body dressing: Maximal Assistance - Patient 25 - 49%     Toileting Toileting    Toileting assist Assist for toileting: Moderate Assistance - Patient 50 - 74%     Transfers Chair/bed transfer  Transfers assist     Chair/bed transfer assist level: Moderate Assistance - Patient 50 - 74% Chair/bed transfer assistive device: Museum/gallery exhibitions officer assist      Assist level: Minimal Assistance - Patient > 75% Assistive device: Parallel bars Max distance: 2ft   Walk 10 feet activity   Assist  Walk 10 feet activity  did not occur: Safety/medical concerns        Walk 50 feet activity   Assist Walk 50 feet with 2 turns activity did not occur: Safety/medical concerns         Walk 150 feet activity   Assist Walk 150 feet activity did not occur: Safety/medical concerns         Walk 10 feet on uneven surface  activity   Assist Walk 10 feet on uneven surfaces activity did not occur: Safety/medical concerns         Wheelchair     Assist Is the patient using a wheelchair?: Yes Type of Wheelchair: Manual    Wheelchair assist level: Dependent - Patient 0%      Wheelchair 50 feet with 2 turns activity    Assist        Assist Level: Dependent - Patient 0%   Wheelchair 150 feet activity     Assist      Assist Level: Dependent - Patient 0%   Blood pressure (!) 146/46, pulse 64, temperature 97.8 F (36.6 C), temperature source Oral, resp. rate 18, height 5\' 9"   (1.753 m), weight 69.7 kg, SpO2 98 %.    Medical Problem List and Plan: 1. 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  MRI 12/27 slight interval expansion of previously an acute right pontine infarction             -patient may shower             -ELOS/Goals: 10-14 days             Continue CIR  Homocysteine reviewed from 12/7 and is 12.2 2.  Antithrombotics: -DVT/anticoagulation:  Pharmaceutical: Lovenox             -antiplatelet therapy: Aspirin 81 mg daily and Brilinta 90 mg twice daily 3. Left hip pain: tramadol added prn. Tylenol as needed 4. Dementia: continue Aricept 10 mg nightly, Namenda 10 mg twice daily             -antipsychotic agents: N/A 5. Neuropsych/cognition: This patient is not capable of making decisions on his own behalf. 6. Skin/Wound Care: Routine skin checks 7. Fluids/Electrolytes/Nutrition: Routine in and outs with follow-up chemistries 8.  Hypertension.  Continue lisinopril 5 mg daily.  Monitor with increased mobility. DBP is below goal given stenosis, d/c norvasc.  9.  Hyperlipidemia.  Lipitor, LDL reviewed and is 28.  10.  AKI on CKD stage III.  AKI felt to be related to hypotension.  Follow-up chemistries.  Baseline creatinine 1.38-1.86. Cr reviewed and is 1.58.  11.  Diabetes mellitus.  Hemoglobin A1c 6.5.  Resume glipizide 2.5 mg daily. Magnesium reviewed and is normal.  12. Vitamin D deficiency: start ergocalciferol 50,000U once per week 13. Fatigue: check B12 and iron tomorrow  LOS: 3 days A FACE TO FACE EVALUATION WAS PERFORMED  14/7 P Justo Hengel 08/25/2022, 11:46 AM

## 2022-08-25 NOTE — Progress Notes (Signed)
Physical Therapy Session Note  Patient Details  Name: GREGG WINCHELL MRN: [id number] Date of Birth: Nov 03, 1950  Today's Date: 08/25/2022 PT Individual Time: 1445-1535 PT Individual Time Calculation (min): 50 min   Short Term Goals: Week 1:  PT Short Term Goal 1 (Week 1): Pt will complete bed mobility with minA PT Short Term Goal 2 (Week 1): Pt will ambulate 10ft with minA and LRAD PT Short Term Goal 3 (Week 1): Pt will safely navigate up/down x4 steps with minA  Skilled Therapeutic Interventions/Progress Updates:      Therapy Documentation Precautions:  Precautions Precautions: Fall Precaution Comments: L hemiparesis, dementia Restrictions Weight Bearing Restrictions: No  Pt agreeable to PT session with max encouragement from therapist and family (spouse and daughter present). Pt requires mod A with sit to stand and max A with gait ~2 ft with no AD. PT dependently donned tennis shoes and ace wrap to left foot for DF assist. Pt progressed and required min A with RW for gait x 80 ft with 2 brief standing rest breaks with max encouragement and w/c follow. Pt transported remaining distance by w/c total A to dayroom and requires mod A for stand pivot with RW to mat. Pt engaged in blocked practice of STS transfers with no AD x 4 with mod assist and tactile cues for anterior weight shift. Pt returned to room and encouraged to sit up in w/c for dinner, pt declined. Pt requires min A for squat pivot and SBA for sit to lying. Pt left semi-reclined in bed with all needs in reach and alarm on.     Therapy/Group: Individual Therapy  Verl Dicker Verl Dicker PT, DPT  08/25/2022, 7:54 AM

## 2022-08-25 NOTE — Progress Notes (Signed)
Occupational Therapy Session Note  Patient Details  Name: Mitchell Washington MRN: [id number] Date of Birth: 1951-05-28  Today's Date: 08/25/2022 OT Individual Time: 6203-5597 and 1300-1330 OT Individual Time Calculation (min): 72 min and 30 min    Short Term Goals: Week 1:  OT Short Term Goal 1 (Week 1): Patient will don pants with mod assist OT Short Term Goal 2 (Week 1): Patient will dress upper body with min assist OT Short Term Goal 3 (Week 1): Patient will bathe upper body self  min assist OT Short Term Goal 4 (Week 1): Pt will complete toilet transfer with Min A.  Skilled Therapeutic Interventions/Progress Updates:    AM Session:  Patient received supine in bed, able to indicate need to void when asked.  Walked to bathroom with mod assist and RW.  Patient continent large BM and able to partially complete hygiene.  Needing assist for thoroughness and clothing management on left side.  Walked to shower seat.  Patient needs assist to remove shirt and socks, assist for all aspects of bathing - both step by step cueing and physical assist for lower legs, buttocks,and right arm.   Patient sat at sink to complete grooming and dressing with mod/max assist.  Patient with frequent stops - "take a breather" needing cues to restart task.  Patient returned to bed per his request.  Bed alarm engaged and personal items in reach.    PM Session:  Patient received supine in bed with bible open in lap.  Patient wearing glasses as if reading bible.  Patient's wife and daughter at bedside.  Patient agreeable to OT session.  Patient assisted to sit at edge of bed and trasnferred to wheelchair with mod assist to stand then min assist to stand step with walker.   Transported to gym to address LUE active movement and volitional control.  Worked on table slides to improve shoulder flexion, elbow flexion and extension, forearm pro/supination , and grasp release - with forced use concepts.  Patient showing improved  isolated control with overt cueing to attend to LUE. Patient returned to room, and left up in wheelchair with safety belt in place and engaged and call bell in reach.    Therapy Documentation Precautions:  Precautions Precautions: Fall Precaution Comments: L hemiparesis, dementia Restrictions Weight Bearing Restrictions: No  Pain: Pain Assessment Pain Scale: 0-10 Pain Score: 0-No pain   Therapy/Group: Individual Therapy  Mariah Milling 08/25/2022, 3:38 PM

## 2022-08-25 NOTE — Progress Notes (Signed)
Speech Language Pathology Daily Session Note  Patient Details  Name: Mitchell Washington MRN: [id number] Date of Birth: 28-Oct-1950  Today's Date: 08/25/2022 SLP Individual Time: 4196-2229 SLP Individual Time Calculation (min): 40 min  Short Term Goals: Week 1: SLP Short Term Goal 1 (Week 1): Pt will utilize pacing, overarticulation, and increased vocal intensity to improve speech intelligibility to 80% at the phrase level, to communicate wants/needs/ideas and personal information + functional phrases given Mod A multimodal cues. SLP Short Term Goal 2 (Week 1): Pt will verbalize 2 techniques/modifications that would be helpful to aid in speech comprehensibility within the context of functional communication tasks with Mod A following education.  Skilled Therapeutic Interventions:  Pt was seen for skilled ST targeting goals for communication.  Upon arrival, pt was sleeping in bed but awakened to voice and was agreeable to participating in treatment.  During functional conversations, pt was noted to have fast rushes of speech and low vocal intensity which made speech almost completely unintelligible.  With max cues, pt was able to identify "slow down" and "speak up" as two strategies that he can use to improve speech intelligibility although he was unable to recall these self generated strategies after a ~5 minute delay despite max assist multimodal cues.  Pt was slightly more intelligible at the word-short phrase level when talking about personally relevant/family information but still needed overall max assist.  Pt was left in bed with bed alarm set and call bell within reach.  Continue per current plan of care.    Pain Pain Assessment Pain Scale: 0-10 Pain Score: 0-No pain  Therapy/Group: Individual Therapy  Jesse Nosbisch, Selinda Orion 08/25/2022, 1:37 PM

## 2022-08-25 NOTE — IPOC Note (Signed)
Overall Plan of Care [facility]) Patient Details Name: Mitchell Washington MRN: [id number] DOB: 1950/12/07  Admitting Diagnosis: Right pontine cerebrovascular accident [facility])  Hospital Problems: Principal Problem:   Right pontine cerebrovascular accident [facility])     Functional Problem List: Nursing Pain, Safety, Endurance, Medication Management, Bladder  PT Balance, Behavior, Endurance, Motor, Perception, Safety  OT Balance, Behavior, Perception, Cognition, Sensory, Pain, Endurance, Safety, Vision, Motor  SLP Motor  TR         Basic ADL's: OT Eating, Grooming, Bathing, Dressing, Toileting     Advanced  ADL's: OT       Transfers: PT Bed Mobility, Bed to Chair, Car  OT Toilet, Tub/Shower     Locomotion: PT Ambulation, Stairs     Additional Impairments: OT Fuctional Use of Upper Extremity  SLP Communication expression    TR      Anticipated Outcomes Item Anticipated Outcome  Self Feeding supervision  Swallowing  N/A   Basic self-care  Min assist  Toileting  Min assist   Bathroom Transfers Min assist  Bowel/Bladder  Manage bladder w toileting  Transfers  minA  Locomotion  minA  Communication  Min A  Cognition  N/A  Pain  < 4 with prns  Safety/Judgment  manage w cues   Therapy Plan: PT Intensity: Minimum of 1-2 x/day ,45 to 90 minutes PT Frequency: 5 out of 7 days PT Duration Estimated Length of Stay: 10-12 OT Intensity: Minimum of 1-2 x/day, 45 to 90 minutes OT Frequency: 5 out of 7 days OT Duration/Estimated Length of Stay: 10-12 days SLP Intensity: Minumum of 1-2 x/day, 30 to 90 minutes SLP Frequency: 1 to 3 out of 7 days SLP Duration/Estimated Length of Stay: 7-10 days   Team Interventions: Nursing Interventions Bladder Management, Patient/Family Education, Medication Management, Discharge Planning, Disease Management/Prevention  PT interventions Ambulation/gait training, Training and development officer, Cognitive remediation/compensation, Community  reintegration, Discharge planning, Disease management/prevention, DME/adaptive equipment instruction, Functional electrical stimulation, Functional mobility training, Neuromuscular re-education, Pain management, Patient/family education, Psychosocial support, Splinting/orthotics, Stair training, Therapeutic Activities, Therapeutic Exercise, UE/LE Strength taining/ROM, UE/LE Coordination activities, Visual/perceptual remediation/compensation, Wheelchair propulsion/positioning  OT Interventions Balance/vestibular training, Discharge planning, Functional electrical stimulation, Pain management, Self Care/advanced ADL retraining, Therapeutic Activities, UE/LE Coordination activities, Functional mobility training, Patient/family education, Therapeutic Exercise, Community reintegration, DME/adaptive equipment instruction, Neuromuscular re-education, Splinting/orthotics, UE/LE Strength taining/ROM, Wheelchair propulsion/positioning, Cognitive remediation/compensation, Visual/perceptual remediation/compensation  SLP Interventions Internal/external aids, Functional tasks, Patient/family education, Multimodal communication approach  TR Interventions    SW/CM Interventions Discharge Planning, Psychosocial Support, Patient/Family Education   Barriers to Discharge MD  Medical stability  Nursing Decreased caregiver support, Home environment access/layout inpatient rehab services 07/24/2022 - 08/02/2022 maintained on aspirin as well as Brilinta and was discharged to home ambulating with a rolling walker contact-guard  PT Decreased caregiver support, Home environment access/layout, Lack of/limited family support, Insurance underwriter for SNF coverage, Behavior    OT      SLP Behavior (decreased motivation)    SW Insurance for SNF coverage, Other (comments) dementia and STM issues   Team Discharge Planning: Destination: PT-Home ,OT- Home , SLP-Home Projected Follow-up: PT-Home health PT, 24 hour supervision/assistance, OT-   Home health OT, SLP-Home Health SLP, 24 hour supervision/assistance Projected Equipment Needs: PT-To be determined, OT- To be determined, SLP-None recommended by SLP Equipment Details: PT- , OT-  Patient/family involved in discharge planning: PT- Patient,  OT-Patient unable/family or caregiver not available, SLP-Patient unable/family or caregive not available  MD ELOS: 10-14 days Medical Rehab Prognosis:  Excellent Assessment: The  patient has been admitted for CIR therapies with the diagnosis of acute right pontine infarction. The team will be addressing functional mobility, strength, stamina, balance, safety, adaptive techniques and equipment, self-care, bowel and bladder mgt, patient and caregiver education. Goals have been set at supervision. Anticipated discharge destination is home.        See Team Conference Notes for weekly updates to the plan of care

## 2022-08-26 DIAGNOSIS — E1169 Type 2 diabetes mellitus with other specified complication: Secondary | ICD-10-CM

## 2022-08-26 DIAGNOSIS — I10 Essential (primary) hypertension: Secondary | ICD-10-CM

## 2022-08-26 DIAGNOSIS — I6350 Cerebral infarction due to unspecified occlusion or stenosis of unspecified cerebral artery: Secondary | ICD-10-CM | POA: Diagnosis not present

## 2022-08-26 LAB — GLUCOSE, CAPILLARY
Glucose-Capillary: 102 mg/dL — ABNORMAL HIGH (ref 70–99)
Glucose-Capillary: 223 mg/dL — ABNORMAL HIGH (ref 70–99)
Glucose-Capillary: 150 mg/dL — ABNORMAL HIGH (ref 70–99)
Glucose-Capillary: 119 mg/dL — ABNORMAL HIGH (ref 70–99)

## 2022-08-26 LAB — IRON AND TIBC
Iron: 68 ug/dL (ref 45–182)
Saturation Ratios: 27 % (ref 17.9–39.5)
TIBC: 249 ug/dL — ABNORMAL LOW (ref 250–450)
UIBC: 181 ug/dL

## 2022-08-26 LAB — HOMOCYSTEINE: Homocysteine: 16.1 umol/L (ref 0.0–17.2)

## 2022-08-26 LAB — VITAMIN B12: Vitamin B-12: 3818 pg/mL — ABNORMAL HIGH (ref 180–914)

## 2022-08-26 NOTE — Progress Notes (Signed)
PROGRESS NOTE   Subjective/Complaints: No new concerns elicited this AM. She says therapy is going well.   ROS: +left hip pain, denies CP or SOB   Objective:   No results found. No results for input(s): "WBC", "HGB", "HCT", "PLT" in the last 72 hours.  No results for input(s): "NA", "K", "CL", "CO2", "GLUCOSE", "BUN", "CREATININE", "CALCIUM" in the last 72 hours.   Intake/Output Summary (Last 24 hours) at 08/26/2022 0809 Last data filed at 08/26/2022 0306 Gross per 24 hour  Intake 360 ml  Output 200 ml  Net 160 ml         Physical Exam: Vital Signs Blood pressure (!) 147/48, pulse (!) 50, temperature 98.7 F (37.1 C), temperature source Oral, resp. rate 16, height 5\' 9"  (1.753 m), weight 69.7 kg, SpO2 97 %. Gen: no distress, normal appearing HEENT: oral mucosa pink and moist, NCAT Cardio: Reg rate Chest: CTAB, normal effort, normal rate of breathing Abd: soft, non-distended, +BS Ext: no edema Psych: pleasant, normal affect Skin: intact Neuro: Patient is alert makes eye contact with examiner.  Follows simple commands.  Oriented to state but not city.  He can provide the month but not appropriate year. LUE 3/5 strength, 4/5 throughout otherwise  MSK: left hip TTP  Assessment/Plan: 1. Functional deficits which require 3+ hours per day of interdisciplinary therapy in a comprehensive inpatient rehab setting. Physiatrist is providing close team supervision and 24 hour management of active medical problems listed below. Physiatrist and rehab team continue to assess barriers to discharge/monitor patient progress toward functional and medical goals  Care Tool:  Bathing    Body parts bathed by patient: Face, Chest, Abdomen, Left upper leg, Right upper leg, Left arm   Body parts bathed by helper: Right arm, Right lower leg, Left lower leg, Buttocks     Bathing assist Assist Level: Moderate Assistance - Patient 50 -  74%     Upper Body Dressing/Undressing Upper body dressing   What is the patient wearing?: Pull over shirt    Upper body assist Assist Level: Moderate Assistance - Patient 50 - 74%    Lower Body Dressing/Undressing Lower body dressing      What is the patient wearing?: Incontinence brief, Pants     Lower body assist Assist for lower body dressing: Maximal Assistance - Patient 25 - 49%     Toileting Toileting    Toileting assist Assist for toileting: Moderate Assistance - Patient 50 - 74%     Transfers Chair/bed transfer  Transfers assist     Chair/bed transfer assist level: Moderate Assistance - Patient 50 - 74% Chair/bed transfer assistive device: Museum/gallery exhibitions officer assist      Assist level: Minimal Assistance - Patient > 75% Assistive device: Parallel bars Max distance: 66ft   Walk 10 feet activity   Assist  Walk 10 feet activity did not occur: Safety/medical concerns        Walk 50 feet activity   Assist Walk 50 feet with 2 turns activity did not occur: Safety/medical concerns         Walk 150 feet activity   Assist Walk 150 feet activity did  not occur: Safety/medical concerns         Walk 10 feet on uneven surface  activity   Assist Walk 10 feet on uneven surfaces activity did not occur: Safety/medical concerns         Wheelchair     Assist Is the patient using a wheelchair?: Yes Type of Wheelchair: Manual    Wheelchair assist level: Dependent - Patient 0%      Wheelchair 50 feet with 2 turns activity    Assist        Assist Level: Dependent - Patient 0%   Wheelchair 150 feet activity     Assist      Assist Level: Dependent - Patient 0%   Blood pressure (!) 147/48, pulse (!) 50, temperature 98.7 F (37.1 C), temperature source Oral, resp. rate 16, height 5\' 9"  (1.753 m), weight 69.7 kg, SpO2 97 %.    Medical Problem List and Plan: 1. 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  MRI 12/27 slight interval expansion of previously an acute right pontine infarction             -patient may shower             -ELOS/Goals: 10-14 days             Continue CIR  Homocysteine reviewed from 12/7 and is 12.2 2.  Antithrombotics: -DVT/anticoagulation:  Pharmaceutical: Lovenox             -antiplatelet therapy: Aspirin 81 mg daily and Brilinta 90 mg twice daily 3. Left hip pain: tramadol added prn. Tylenol as needed 4. Dementia: continue Aricept 10 mg nightly, Namenda 10 mg twice daily             -antipsychotic agents: N/A 5. Neuropsych/cognition: This patient is not capable of making decisions on his own behalf. 6. Skin/Wound Care: Routine skin checks 7. Fluids/Electrolytes/Nutrition: Routine in and outs with follow-up chemistries 8.  Hypertension.  Continue lisinopril 5 mg daily.  Monitor with increased mobility. DBP is below goal given stenosis, d/c norvasc.  1/8 Controlled     08/26/2022    1:54 PM 08/26/2022    3:01 AM 08/25/2022    7:58 PM  Vitals with BMI  Systolic 123 147 10/24/2022  Diastolic 77 48 71  Pulse 93 50 58    9.  Hyperlipidemia.  Lipitor, LDL reviewed and is 28.  10.  AKI on CKD stage III.  AKI felt to be related to hypotension.  Follow-up chemistries.  Baseline creatinine 1.38-1.86. Cr reviewed and is 1.58.  11.  Diabetes mellitus.  Hemoglobin A1c 6.5.  Resume glipizide 2.5 mg daily. Magnesium reviewed and is normal.  1/8 Controlled overall-continue current regimen   12. Vitamin D deficiency: start ergocalciferol 50,000U once per week 13. Fatigue: check B12 and iron tomorrow  LOS: 4 days A FACE TO FACE EVALUATION WAS PERFORMED  607 08/26/2022, 8:09 AM

## 2022-08-26 NOTE — Progress Notes (Signed)
Occupational Therapy Session Note  Patient Details  Name: Mitchell Washington MRN: [id number] Date of Birth: [birth year]  Today's Date: 08/26/2022 OT Individual Time: 1350-1445 OT Individual Time Calculation (min): 55 min    Short Term Goals: Week 1:  OT Short Term Goal 1 (Week 1): Patient will don pants with mod assist OT Short Term Goal 2 (Week 1): Patient will dress upper body with min assist OT Short Term Goal 3 (Week 1): Patient will bathe upper body self  min assist OT Short Term Goal 4 (Week 1): Pt will complete toilet transfer with Min A.  Skilled Therapeutic Interventions/Progress Updates:  Pt greeted supine in bed with NT present assessing vitals, pt agreeable to OT intervention. Session focus on LUE NMR, LUE AAROM, functional mobility, dynamic standing balance, BADL reeducation and decreasing overall caregiver burden.        Pt completed supine>sit to EOB with MIN A needing cues for sequencing and assist to scoot L hip to EOB.  Pt completed stand pivot to w/c with RW with MINA, assisted needed to position LUE on grip of RW and pt stating "I need you to help me"before initiating standing. Total A transport to gym in w/c for time mgmt.   Standing at hi lo table worked on [employer] with use of beezy board to facilitate horizontal shoulder ABD/ADD. Once LUE positioned on board with use of dycem to ensure position pt able to complete active movement with L elbow supported. Pt however reports fatigue needing to sit after ~ 4 mins.  Deferred remainder of therex to Seated with pt completing shoulder flexion with beezy board, pt needed MIN A at elbow to complete task and to fully extend R elbow.    Worked on functional grasp with LUE with pt instructed to place L hand on Wash cloth, grasp wash cloth and place wash cloth on opposite side of R hand positioned on table to challenge target reach. Pt needed step by step cues to sequence/problem solve task however pt was able to grasp wash cloth with L  hand with gross grasp and L elbow supported but had issues moving LUE against gravity to place wash cloth on opposite side of R hand.   Pt transported back to room in w/c where pt completed stand pivot to toilet with use of grab bars with overall MODA mostly for sequencing pivotal steps to toilet, assisted needed for clothing mgmt with pt needing overall MODA for 3/3 toileting tasks.  Incontinent urine void.    Ended session with pt supine in bed with all needs within reach and bed alarm activated.                    Therapy Documentation Precautions:  Precautions Precautions: Fall Precaution Comments: L hemiparesis, dementia Restrictions Weight Bearing Restrictions: No  Pain: no pain reported     Therapy/Group: Individual Therapy  Precious Haws 08/26/2022, 3:39 PM

## 2022-08-26 NOTE — Progress Notes (Addendum)
Speech Language Pathology Daily Session Note  Patient Details  Name: Mitchell Washington MRN: [id number] Date of Birth: [birth year]  Today's Date: 08/26/2022 SLP Individual Time: 5726-2035 SLP Individual Time Calculation (min): 45 min  Short Term Goals: Week 1: SLP Short Term Goal 1 (Week 1): Pt will utilize pacing, overarticulation, and increased vocal intensity to improve speech intelligibility to 80% at the phrase level, to communicate wants/needs/ideas and personal information + functional phrases given Mod A multimodal cues. SLP Short Term Goal 2 (Week 1): Pt will verbalize 2 techniques/modifications that would be helpful to aid in speech comprehensibility within the context of functional communication tasks with Mod A following education.  Skilled Therapeutic Interventions: Pt seen this date for skilled ST intervention targeting communication/speech intelligibility goals outlined in care plan. Pt received awake/alert and sitting upright in bed with AM meal in front of him. NT providing full supervision - minimal PO intake noted before pt endorsed that he was finished with his meal (< 5% consumed). Agreeable to intervention at bedside. Participatory with persistent cueing. Affect remains flat with pt appearing apathetic towards therapy. Intermittently tearful.  Today's session with emphasis on functional communication within the context of structured and unstructured speech tasks to include basic conversation re: wants, needs and responding to close-ended questions. When given large print and donning glasses, pt read functional phrases aloud with overall 40% intelligibility at the short phrase and sentence level without intervention + within a known context (for listener); improved to 50% intelligibility given Mod-Max A multimodal cues. In an effort to improve participation, salience, and generalization, SLP incorporated preferred stimuli, audio feedback, written cues/visual supports (cues to "slow  down" and "be loud"), eliminated extraneous noise, and provided model prompts with minimal change noted in speech intelligibility despite aforementioned multimodal cueing/interventions; no generalization to unstructured communication acts; without known context, pt is essentially unintelligible. Nevertheless, pt was noted to intermittently attempt to slow down when SLP stated that she did not understand what pt stated and repeated back to pt what was heard; minimal response to audio feedback.   For communication of wants and needs, pt with minimal spontaneous communication and benefits from being asked yes/no + close-ended questions, and begin provided with binary choice prompts (e.g. "would you like to eat more?" or "are you all done?"). Pt's speech intelligibility was noted to improve slightly with use of choral singing with visual aid/lyrics present.  Given pt did not eat breakfast, offered Ensure (vanilla), which pt accepted and drank without difficulty.  Pt left in bed with all safety measures activated and call bell within reach. Nutritional supplement within reach. Continue per current ST POC. Suspect AAC will be difficult for pt to utilize given severity of cognitive deficits in the setting of dementia.   Pain No pain reported; NAD  Therapy/Group: Individual Therapy  Rhayne Chatwin A Harless Molinari 08/26/2022, 9:55 AM

## 2022-08-26 NOTE — Progress Notes (Signed)
Physical Therapy Session Note  Patient Details  Name: Mitchell Washington MRN: [id number] Date of Birth: [birth year]  Today's Date: 08/26/2022 PT Individual Time: 6004-5997 PT Individual Time Calculation (min): 58 min   Short Term Goals: Week 1:  PT Short Term Goal 1 (Week 1): Pt will complete bed mobility with minA PT Short Term Goal 2 (Week 1): Pt will ambulate 44ft with minA and LRAD PT Short Term Goal 3 (Week 1): Pt will safely navigate up/down x4 steps with minA  Skilled Therapeutic Interventions/Progress Updates:      Pt in bed, awake, on arrival. Reports having a "terrible" night but unable to explain. Pt continues to have severe dysarthria, very difficult to understand despite max cues for slowing down speech and trying to improve articulation. Pt also needing max encouragement to participate.  Pt reports he has pants on when asked, but when uncovered he isn't wearing any. Assisted with donning pants at bed level - modA for threading and for assisting in pulling over hips. Patient with learned helplessness, needs max cues for initiating effort and for completing task (often gives up).   Supine to sitting EOB requiring heavy modA for trunk support, with HOB nearly fully elevated. Fair sitting balance without LOB unsupported. Raised EOB, needing minA for sit to stand to RW, ++ time for initiation and effort, strong posterior bias. Cues for LUE to walker with decreased awareness.   Gait training in his room, minA and RW, to w/c, cues for safety approach, RW management, and increasing B step height. Pt with very poor controlled lowering to sit, "flopping" into chair.  Transported to main rehab gym for time management.   Once in gym, pt refusing to participate in gait training. Reports he will "do it tomorrow." Pt becoming emotional, tearful - support provided and pt ultimately agreeable.   Sit<>Stand from w/c height requiring minA for powering to rise - strong posterior lean. Cues and  assist for LUE hand placement to RW, weak grasp. Gait training in room, ~9ft with turns, CGA/minA and RW. Poor L foot clearance and inconsistent step length - needs adjustment of L hand at times as it falls off, resulting in poor RW management. Pt struggled with locating his w/c when returning from ambulating - needing max cues to locate and identify as his. Continued gait training by introducing cones to practice weaving in/out of to challenge balance, awareness, and work on [employer]. Pt continuing to need minA for stability and mod cues for awareness of RW, using verbal cueing such as "point the walker the direction you want to walk." Which he responded well to.  Kinetron completed at wheelchair level, 4 minutes at L90cm/sec resistance, TC needed for initiation and sequencing. Pt frequently stopping and needing max cues to participate. Limited reciprocal pattern with impaired motor control.  Returned to his room and assisted to bed with stand<>pivot transfer, modA using hospital bed rail for support. Pt "falling" into the bed despite cues and assist. Pt needing assist for LB management to position into L sidelying. Concluded session with alarm on and call bell in reach   Therapy Documentation Precautions:  Precautions Precautions: Fall Precaution Comments: L hemiparesis, dementia Restrictions Weight Bearing Restrictions: No General:      Therapy/Group: Individual Therapy  Mitchell Washington 08/26/2022, 7:49 AM

## 2022-08-26 NOTE — Progress Notes (Signed)
Patient ID: Mitchell Washington, male   DOB: [birth year], 72 y.o.   MRN: [id number] Met with the patient to review current situation, rehab process, team conference and plan of care. Reviewed medications and dietary modification recommendations to manage secondary risks. Currently on [facility] diet. Given information on ASA + Brilinta, HTN, CKD, DM and dietary tips for increased Vit B12 and Vit D. Continue to follow along to address educational needs to facilitate preparation for discharge home with the wife. Margarito Liner

## 2022-08-26 NOTE — Progress Notes (Signed)
Occupational Therapy Session Note  Patient Details  Name: Mitchell Washington MRN: [id number] Date of Birth: [birth year]  Today's Date: 08/26/2022 OT Individual Time: 1102-1200 OT Individual Time Calculation (min): 58 min    Short Term Goals: Week 1:  OT Short Term Goal 1 (Week 1): Patient will don pants with mod assist OT Short Term Goal 2 (Week 1): Patient will dress upper body with min assist OT Short Term Goal 3 (Week 1): Patient will bathe upper body self  min assist OT Short Term Goal 4 (Week 1): Pt will complete toilet transfer with Min A.  Skilled Therapeutic Interventions/Progress Updates:    Patient received supine in bed.  Patient indicates not wanting to get out of bed - but with coaxing, agreeable to get up and dressed.  Patient indicated no need to void, and his brief was clean and dry. Patient required mod assist to sit at edge of bed demonstrating strong posterior bias with sitting initially. Once feet firmly on floor able to shift his mass forward and maintain sitting balance.  Transferred to wheelchair and to sink to bathe and dress self.  Patient frequently indicates "no" when instructed to do something, but is easily redirected.  Patient requires max assist sit to stand x 2 at sink, however when placed near bed rail he could stand with min assist to supervision.  Encouraged use of left hand and leg throughout session in functional context.   Transported to gym to address LUE active movement in shoulder and elbow.  Patient has delayed movement, and impaired sensation so needs time to access LUE movement at all joints.   Patient left up in wheelchair with safety belt in place and engaged, persoanl items/ call in reach and family - daughter and grandson at bedside.     Therapy Documentation Precautions:  Precautions Precautions: Fall Precaution Comments: L hemiparesis, dementia Restrictions Weight Bearing Restrictions: No Pain: Pain Assessment Pain Scale: 0-10 Pain Score:  0-No pain Faces Pain Scale: No hurt    Mariah Milling 08/26/2022, 12:54 PM

## 2022-08-27 DIAGNOSIS — I6350 Cerebral infarction due to unspecified occlusion or stenosis of unspecified cerebral artery: Secondary | ICD-10-CM | POA: Diagnosis not present

## 2022-08-27 LAB — GLUCOSE, CAPILLARY
Glucose-Capillary: 114 mg/dL — ABNORMAL HIGH (ref 70–99)
Glucose-Capillary: 206 mg/dL — ABNORMAL HIGH (ref 70–99)
Glucose-Capillary: 105 mg/dL — ABNORMAL HIGH (ref 70–99)
Glucose-Capillary: 109 mg/dL — ABNORMAL HIGH (ref 70–99)

## 2022-08-27 NOTE — Progress Notes (Signed)
Physical Therapy Session Note  Patient Details  Name: Mitchell Washington MRN: [id number] Date of Birth: 01/31/1951  Today's Date: 08/27/2022 PT Individual Time: 7591-6384 PT Individual Time Calculation (min): 44 min   Short Term Goals: Week 1:  PT Short Term Goal 1 (Week 1): Pt will complete bed mobility with minA PT Short Term Goal 2 (Week 1): Pt will ambulate 11ft with minA and LRAD PT Short Term Goal 3 (Week 1): Pt will safely navigate up/down x4 steps with minA  Skilled Therapeutic Interventions/Progress Updates:      Pt in bed resting on arrival - dressed but sleeping. Awakens to voice. Needs encouragment to participate in therapy session and frequently asks "why" when instructed on mobility.   HOB flat and patient required maxA for bed mobility. Re attempted bed mobility using hospital bed features (HOB raised 65deg) and he completes with supervision. Lacks adequate trunk control/initiation for rising out of the bed from supine position. Pt needing more than a reasonable amount of time to complete. Continues to show learned helplessness behaviors. Pt may require hospital bed at DC - will continue to monitor.   Requires assist for forward scooting to EOB and modA for powering to rise to RW. Once standing, needs minA for balance with RW supporting and completed ambulatory transfer with CGA/minA and RW to his w/c., Poor eccentric control for stand to sit and pt "flopping" into w/c despite cues for reaching for chair and slow sitting.  Transported to day room gym. Assisted to Nustep with stand<>pivot transfer with CGA and RW - minA for standing and CGA for balance. SetupA on Nustep for L hemibody. Completed x5 minutes at L6 resistance - used gait belt around distal femurs to keep L knee in neutral alignment. Pt needing x3 rest breaks during the activity.  Gait training ~34ft with CGA and RW on level surfaces - cues for increasing L step length, monitoring L hand on RW, and keeping body within  walker frame. No knee buckling observed, but very slow gait speed. Gait distance limited by fatigued.  Returned to room and pt completes stand<>pivot transfer with CGA using hospital bed rail. He required modA for sit to supine for trunk and LLE management. With HOB in trendelenburg, he's able to scoot himself to head of bed with cues only. Alarm on, HOB raised, all needs met at end of session. Pt falling asleep quickly once returned to bed.    Therapy Documentation Precautions:  Precautions Precautions: Fall Precaution Comments: L hemiparesis, dementia Restrictions Weight Bearing Restrictions: No General:      Therapy/Group: Individual Therapy  [name] 08/27/2022, 7:35 AM

## 2022-08-27 NOTE — Progress Notes (Signed)
Physical Therapy Session Note  Patient Details  Name: CEDRICK PARTAIN MRN: [id number] Date of Birth: Aug 24, 1950  Today's Date: 08/27/2022 PT Individual Time: 0805-0902 PT Individual Time Calculation (min): 57 min   Short Term Goals: Week 1:  PT Short Term Goal 1 (Week 1): Pt will complete bed mobility with minA PT Short Term Goal 2 (Week 1): Pt will ambulate 39ft with minA and LRAD PT Short Term Goal 3 (Week 1): Pt will safely navigate up/down x4 steps with minA Week 2:     Skilled Therapeutic Interventions/Progress Updates:      Therapy Documentation Precautions:  Precautions Precautions: Fall Precaution Comments: L hemiparesis, dementia Restrictions Weight Bearing Restrictions: No General:   Vital Signs: Therapy Vitals Temp: 98.3 F (36.8 C) Pulse Rate: (!) 55 Resp: 16 BP: (!) 119/42 Patient Position (if appropriate): Lying Oxygen Therapy SpO2: 97 % O2 Device: Room Air Pain:   Mobility:   Locomotion :    Trunk/Postural Assessment :    Balance:   Exercises:   Other Treatments:      Therapy/Group: Individual Therapy  [name] PT, DPT, CSRS 08/27/2022, 8:11 AM

## 2022-08-27 NOTE — Progress Notes (Signed)
Physical Therapy Session Note  Patient Details  Name: Mitchell Washington MRN: [id number] Date of Birth: Oct 06, 1950  Today's Date: 08/27/2022 PT Individual Time: 1317-1400 PT Individual Time Calculation (min): 43 min  and Today's Date: 08/27/2022 PT Missed Time: 17 Minutes Missed Time Reason: Patient unwilling to participate  Short Term Goals: Week 1:  PT Short Term Goal 1 (Week 1): Pt will complete bed mobility with minA PT Short Term Goal 2 (Week 1): Pt will ambulate 72ft with minA and LRAD PT Short Term Goal 3 (Week 1): Pt will safely navigate up/down x4 steps with minA  Skilled Therapeutic Interventions/Progress Updates:      Therapy Documentation Precautions:  Precautions Precautions: Fall Precaution Comments: L hemiparesis, dementia Restrictions Weight Bearing Restrictions: No   Pt declined PT on first attempt and agreeable with max encouragement on PT's return. Pt requires min A for supine to sit for trunk control and mod A with STS from bed with RW. Pt ambulated ~135 ft with RW and min A with max cues for attention and encouragment to continue. PT utilized ace wrap to provide DF assist and provided verbal cues for left foot clearance and pt able to self-correct, not maintain consistently throughout gait. Pt reported fatigue and pain and required seated rest break. Pt asks where pain was present and pt responded with "what pain?" Pt declined further mobility and returned to room total A by w/c. Pt requires max A with squat pivot and min A for sit to lying. Pt left semi-reclined in bed with all needs in reach and alarm on. Pt missed total of 17 skilled minutes of skilled PT due to lack of participation and engagement.    Therapy/Group: Individual Therapy  Verl Dicker Verl Dicker PT, DPT  08/27/2022, 8:01 AM

## 2022-08-27 NOTE — Progress Notes (Signed)
PROGRESS NOTE   Subjective/Complaints: No new complaints this morning Tolerated therapy well today.  Ambulated 40 feet CG with RW with decreased L sided step length noted  ROS: +left hip pain, +fatigue   Objective:   No results found. No results for input(s): "WBC", "HGB", "HCT", "PLT" in the last 72 hours.  No results for input(s): "NA", "K", "CL", "CO2", "GLUCOSE", "BUN", "CREATININE", "CALCIUM" in the last 72 hours.   Intake/Output Summary (Last 24 hours) at 08/27/2022 1346 Last data filed at 08/27/2022 1200 Gross per 24 hour  Intake 256 ml  Output --  Net 256 ml        Physical Exam: Vital Signs Blood pressure (!) 119/42, pulse (!) 55, temperature 98.3 F (36.8 C), resp. rate 16, height 5\' 9"  (1.753 m), weight 69.7 kg, SpO2 97 %. Gen: no distress, normal appearing HEENT: oral mucosa pink and moist, NCAT Cardio: Bradycardia Chest: normal effort, normal rate of breathing Abd: soft, non-distended Ext: no edema Psych: pleasant, normal affect Skin: intact Neuro: Patient is alert makes eye contact with examiner.  Follows simple commands.  Oriented to state but not city.  He can provide the month but not appropriate year. LUE 3/5 strength, 4/5 throughout otherwise  MSK: left hip TTP  Assessment/Plan: 1. Functional deficits which require 3+ hours per day of interdisciplinary therapy in a comprehensive inpatient rehab setting. Physiatrist is providing close team supervision and 24 hour management of active medical problems listed below. Physiatrist and rehab team continue to assess barriers to discharge/monitor patient progress toward functional and medical goals  Care Tool:  Bathing    Body parts bathed by patient: Face, Chest, Abdomen, Left upper leg, Right upper leg, Left arm   Body parts bathed by helper: Right arm, Right lower leg, Left lower leg, Buttocks     Bathing assist Assist Level: Moderate Assistance  - Patient 50 - 74%     Upper Body Dressing/Undressing Upper body dressing   What is the patient wearing?: Pull over shirt    Upper body assist Assist Level: Moderate Assistance - Patient 50 - 74%    Lower Body Dressing/Undressing Lower body dressing      What is the patient wearing?: Incontinence brief, Pants     Lower body assist Assist for lower body dressing: Maximal Assistance - Patient 25 - 49%     Toileting Toileting    Toileting assist Assist for toileting: Maximal Assistance - Patient 25 - 49%     Transfers Chair/bed transfer  Transfers assist     Chair/bed transfer assist level: Moderate Assistance - Patient 50 - 74% Chair/bed transfer assistive device: Museum/gallery exhibitions officer assist      Assist level: Minimal Assistance - Patient > 75% Assistive device: Parallel bars Max distance: 48ft   Walk 10 feet activity   Assist  Walk 10 feet activity did not occur: Safety/medical concerns        Walk 50 feet activity   Assist Walk 50 feet with 2 turns activity did not occur: Safety/medical concerns         Walk 150 feet activity   Assist Walk 150 feet activity did not  occur: Safety/medical concerns         Walk 10 feet on uneven surface  activity   Assist Walk 10 feet on uneven surfaces activity did not occur: Safety/medical concerns         Wheelchair     Assist Is the patient using a wheelchair?: Yes Type of Wheelchair: Manual    Wheelchair assist level: Dependent - Patient 0%      Wheelchair 50 feet with 2 turns activity    Assist        Assist Level: Dependent - Patient 0%   Wheelchair 150 feet activity     Assist      Assist Level: Dependent - Patient 0%   Blood pressure (!) 119/42, pulse (!) 55, temperature 98.3 F (36.8 C), resp. rate 16, height 5\' 9"  (1.753 m), weight 69.7 kg, SpO2 97 %.    Medical Problem List and Plan: 1. 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  MRI 12/27 slight interval expansion of previously an acute right pontine infarction             -patient may shower             -ELOS/Goals: 10-14 days             Continue CIR  Homocysteine reviewed from 12/7 and is 12.2 2.  Impaired mobility: continue Lovenox             -antiplatelet therapy: Aspirin 81 mg daily and Brilinta 90 mg twice daily 3. Left hip pain: tramadol added prn. Tylenol as needed. Aquatheramia ordered 4. Dementia: continue Aricept 10 mg nightly, Namenda 10 mg twice daily             -antipsychotic agents: N/A 5. Neuropsych/cognition: This patient is not capable of making decisions on his own behalf. 6. Skin/Wound Care: Routine skin checks 7. Fluids/Electrolytes/Nutrition: Routine in and outs with follow-up chemistries 8.  Hypertension.  Continue lisinopril 5 mg daily.  Monitor with increased mobility. DBP is below goal given stenosis, d/c norvasc.  9.  Hyperlipidemia.  Lipitor, LDL reviewed and is 28.  10.  AKI on CKD stage III.  AKI felt to be related to hypotension.  Follow-up chemistries.  Baseline creatinine 1.38-1.86. Cr reviewed and is 1.58.  11.  Diabetes mellitus.  Hemoglobin A1c 6.5.  Resume glipizide 2.5 mg daily. Magnesium reviewed and is normal.  12. Vitamin D deficiency: start ergocalciferol 50,000U once per week 13. Fatigue: B12 reviewed and elevated.   LOS: 5 days A FACE TO FACE EVALUATION WAS PERFORMED  14/7 P Mitchell Washington 08/27/2022, 1:46 PM

## 2022-08-27 NOTE — Progress Notes (Signed)
Occupational Therapy Session Note  Patient Details  Name: Mitchell Washington MRN: [id number] Date of Birth: 1950/10/06  Today's Date: 08/27/2022 OT Individual Time: 1400-1425 OT Individual Time Calculation (min): 25 min    Short Term Goals: Week 1:  OT Short Term Goal 1 (Week 1): Patient will don pants with mod assist OT Short Term Goal 2 (Week 1): Patient will dress upper body with min assist OT Short Term Goal 3 (Week 1): Patient will bathe upper body self  min assist OT Short Term Goal 4 (Week 1): Pt will complete toilet transfer with Min A.  Skilled Therapeutic Interventions/Progress Updates:    Pt sleeping in bed upon arrival. Min verbal cues to arouse although pt required verbal cues throughout session to keep eyes open. Focus on LUE NMR, LUE AAROM, sitting balance, and bed mobility.  Supine>sit EOB with min A and min verbal cues for sequencing. Min A to scoot Lt hip to EOB. Sitting balance EOB with CGA. Sit>stand from EOB with min A. LUE AAROM to facilitate horizontal shoulder abd/add. Pt required multiple rest breaks. Mod A sit>supine. Pt remained in bed with all needs within reach. Bed alarm activated.   Therapy Documentation Precautions:  Precautions Precautions: Fall Precaution Comments: L hemiparesis, dementia Restrictions Weight Bearing Restrictions: No  Pain:  Pt denies pain this afternoon    Therapy/Group: Individual Therapy  Leroy Libman 08/27/2022, 2:30 PM

## 2022-08-28 DIAGNOSIS — I6350 Cerebral infarction due to unspecified occlusion or stenosis of unspecified cerebral artery: Secondary | ICD-10-CM | POA: Diagnosis not present

## 2022-08-28 LAB — GLUCOSE, CAPILLARY
Glucose-Capillary: 108 mg/dL — ABNORMAL HIGH (ref 70–99)
Glucose-Capillary: 215 mg/dL — ABNORMAL HIGH (ref 70–99)
Glucose-Capillary: 123 mg/dL — ABNORMAL HIGH (ref 70–99)
Glucose-Capillary: 134 mg/dL — ABNORMAL HIGH (ref 70–99)

## 2022-08-28 MED ORDER — LISINOPRIL 5 MG PO TABS
2.5000 mg | ORAL_TABLET | Freq: Every day | ORAL | Status: DC
  Administered 2022-08-29: 2.5 mg via ORAL
  Filled 2022-08-28: qty 1

## 2022-08-28 MED ORDER — MIRTAZAPINE 15 MG PO TABS
7.5000 mg | ORAL_TABLET | Freq: Every day | ORAL | Status: DC
  Administered 2022-08-28 – 2022-08-29 (×2): 7.5 mg via ORAL
  Filled 2022-08-28 (×2): qty 1

## 2022-08-28 MED ORDER — MEGESTROL ACETATE 400 MG/10ML PO SUSP: 400.0000 mg | Freq: Every day | ORAL | Status: DC

## 2022-08-28 MED ORDER — SODIUM CHLORIDE 0.9 % IV SOLN: INTRAVENOUS | Status: DC

## 2022-08-28 MED ORDER — L-METHYLFOLATE-B6-B12 3-35-2 MG PO TABS
1.0000 | ORAL_TABLET | Freq: Every day | ORAL | Status: DC
  Administered 2022-08-28 – 2022-09-03 (×7): 1 via ORAL
  Filled 2022-08-28 (×7): qty 1

## 2022-08-28 NOTE — Progress Notes (Signed)
Physical Therapy Session Note  Patient Details  Name: Mitchell Washington MRN: [id number] Date of Birth: 02-Sep-1950   Today's Date: 08/28/2022 PT Individual Time: 0800-0830 + 1400-1443 PT Individual Time Calculation (min): 30 min  + 43 min  Short Term Goals: Week 1:  PT Short Term Goal 1 (Week 1): Pt will complete bed mobility with minA PT Short Term Goal 2 (Week 1): Pt will ambulate 30ft with minA and LRAD PT Short Term Goal 3 (Week 1): Pt will safely navigate up/down x4 steps with minA  Skilled Therapeutic Interventions/Progress Updates:      1st session: Pt in bed with NT assisting with feeding him breakfast. Pt noted to have food in mouth (sausage) and needed cues for awareness to swallow and for washing down with thin water. Relayed to SLP.  Pt needing max encouragement to participate - this happens daily. More than a reasonable amount of time for initiating and completing supine to sitting EOB, with hospital bed features. Needs modA for trunk management. minA for sitting to standing using the RW from low EOB height. Ambulates with CGA/minA and RW from bed to toilet - assist for managing pants/brief in standing.   Pt continent of BM and bladder, charted in flowsheets. Pt needing assist for posterior pericare in standing with RW support for balance.  Gait training to w/c in his room, ~34ft with minA and RW - cues for increasing step length, forward gaze, and using RW to "point" in the direction he would like to go. Poor controlled lowering to sit in w/c.  Once sitting in w/c, pt sweating significantly and appeared to have decreased alertness. Pt reporting "feeling sick."  BP sitting: 82/41 BP supine when returned to bed: 139/52 *LPN notified as well as MD via secure chat.  Pt left in bed with HOB flat, all needs met., call bell in reach.   2nd session: Pt supine in bed resting - awakens to voice. Denies pain and requires max encouragement to participate. Unable to provide reason  for apathy or disinterest in therapy.  Donned knee-high compression socks and shoes with totalA for time. With hospital bed features, completes supine<>sitting EOB with min/modA, more than a reasonable amount of time to complete.   Sit<>Stand with modA, primarily for initiation and effort, to rise to stand from EOB to RW. Short distance ambulatory transfer with minA and RW, poor controlled lowering to chair.  Transported to main rehab gym.  BP sitting: 102/53  Completed table top activity in standing with minA for standing balance due to L trunk lean. Standing tolerance up to 2 minutes max before patient becoming fatigued. Table top activity included colored push pin puzzle. Pt able to complete with accuracy, a few times with incorrect color naming but matching accordingly. He initiates upright standing without cues when he begins to flex at the knees and hips. Pt continuing to need max encouragement for effort and participation.  Pt concluded session seated in w/c - parked at the nurses station to encourage him to stay OOB in the chair, increase upright tolerance, and change of environment. NT and LPN made aware of pt status.   Therapy Documentation Precautions:  Precautions Precautions: Fall Precaution Comments: L hemiparesis, dementia Restrictions Weight Bearing Restrictions: No General:     Therapy/Group: Individual Therapy  [name] PT 08/28/2022, 7:42 AM

## 2022-08-28 NOTE — Progress Notes (Signed)
Occupational Therapy Session Note  Patient Details  Name: Mitchell Washington MRN: [id number] Date of Birth: [birth year]  Today's Date: 08/28/2022 OT Individual Time: 0945-1100 OT Individual Time Calculation (min): 75 min    Short Term Goals: Week 1:  OT Short Term Goal 1 (Week 1): Patient will don pants with mod assist OT Short Term Goal 2 (Week 1): Patient will dress upper body with min assist OT Short Term Goal 3 (Week 1): Patient will bathe upper body self  min assist OT Short Term Goal 4 (Week 1): Pt will complete toilet transfer with Min A.  Skilled Therapeutic Interventions/Progress Updates:   Patient received supine in bed.  Patient declining option for OT session initially.  Expressing desire to not get out of bed or leave the room. Patient indicates no need to void.  Patient given encouragement and increased time to get to edge of bed.  Patient states "I can't stand" but with increased time and min cueing for forward weight shift able to transition to standing with min assist to supervision.  Worked on functional mobility - sit to stand, walking, turning, backing up and orienting to chair.  Patient colliding with obstacles on the right with walker and little attempt to avoid - although able to correct.  Worked on wheelchair mobility short distances.   Neuromuscular reeducation to LUE.  Supine bilateral UE exercise - chest press with dowel.  Patient needing cueing to maintain left hand grip on bar, and also to fully extend elbow.  This appears to be as much left inattention/ sensory/perceptual issue as much as weakness/hemiplegia.  Patient makes little attempt to self correct or attend to left hand in this position.  Patient returned to room, and put back to bed.  Bed alarm engaged, and call bell/ personal items in reach.  Patient able to ID correct button to call nurse for assistance.    Therapy Documentation Precautions:  Precautions Precautions: Fall Precaution Comments: L hemiparesis,  dementia Restrictions Weight Bearing Restrictions: No   Pain: No pain   Therapy/Group: Individual Therapy  Mariah Milling 08/28/2022, 3:11 PM

## 2022-08-28 NOTE — Progress Notes (Signed)
Patient ID: Mitchell Washington, male   DOB: 1951/04/30, 72 y.o.   MRN: [id number]  Spoke with wife via telephone to give her a team conference update regarding goals of min assist and target discharge date 1/16. Discussed hospital bed and she does not have any room for one. Also discussed education prior to discharge and she reports she and daughter will be here later this week and she will call this worker to let her know. She reports seeing him in therapies on Sunday when here. Will update home health agency regarding discharge date and work on any additional needs.

## 2022-08-28 NOTE — Progress Notes (Addendum)
Speech Language Pathology Daily Session Note  Patient Details  Name: Mitchell Washington MRN: [id number] Date of Birth: 10/01/1950  Today's Date: 08/28/2022 SLP Individual Time: 0830-0910 SLP Individual Time Calculation (min): 40 min  Short Term Goals: Week 1: SLP Short Term Goal 1 (Week 1): Pt will utilize pacing, overarticulation, and increased vocal intensity to improve speech intelligibility to 80% at the phrase level, to communicate wants/needs/ideas and personal information + functional phrases given Mod A multimodal cues. SLP Short Term Goal 2 (Week 1): Pt will verbalize 2 techniques/modifications that would be helpful to aid in speech comprehensibility within the context of functional communication tasks with Mod A following education.  Skilled Therapeutic Interventions: Pt seen this date for skilled ST intervention targeting speech intelligibility goals outlined above. Pt received with eyes closed, lying supine in bed; aroused to name. Agreeable to intervention with coaxing and encouragement. Flat affect remains; apathetic. Visibly fatigued throughout today's session with decreased initiation. D/w MD decreased appetite and lethargy. Only consumed a few sips of Ensure this date (with much encouragement).  Today's session with emphasis on speech intelligibility within the context of structured communication tasks. Overall, pt exhibits ~60% intelligibility when verbalizing functional phrases and sentences given Min A verbal and/or visual cues to slow down and/or increase vocal intensity. Benefits from large print when reading. With encouragement and model prompting from SLP, pt participated in singing familiar songs with lyrics present intermittently. Pt continues with minimal spontaneous speech.  Given PT observed oral holding with solid textures this morning, SLP provided skilled observation of regular textures and thin liquids. Provided pt with Ensure, which he required Mod encouragement to  accept, and a graham cracker with peanut butter. While pt only ate one rectangle, he did so with timely oral transit and effective mastication - only minimal oral residuals noted post-swallow which were cleared with Min A verbal cues to perform liquid rinse. No pharyngeal s/sx concerning for aspiration noted. Suspect oral holding to be more cognitive in nature and ? dislike of taste, texture, and/or food. Continue current diet textures - may want to provide finger foods and full supervision to encourage initiation and intake.  Pt left in bed with all safety measures activated and call bell within reach. Continue per current ST POC. Needs family education prior to d/c.   Pain Pt endorsed neck pain at   Therapy/Group: Individual Therapy  [name] A [name] 08/28/2022, 10:56 AM

## 2022-08-28 NOTE — Progress Notes (Signed)
PROGRESS NOTE   Subjective/Complaints: No new complaints this morning from patient.  SLP notes decreased appetite, discussed appetite stimulant with wife  ROS: +left hip pain, +fatigue, +decreased appetite   Objective:   No results found. No results for input(s): "WBC", "HGB", "HCT", "PLT" in the last 72 hours.  No results for input(s): "NA", "K", "CL", "CO2", "GLUCOSE", "BUN", "CREATININE", "CALCIUM" in the last 72 hours.   Intake/Output Summary (Last 24 hours) at 08/28/2022 1027 Last data filed at 08/28/2022 0841 Gross per 24 hour  Intake 450 ml  Output --  Net 450 ml        Physical Exam: Vital Signs Blood pressure 112/73, pulse 63, temperature 97.7 F (36.5 C), temperature source Oral, resp. rate 18, height 5\' 9"  (1.753 m), weight 69.7 kg, SpO2 99 %. Gen: no distress, normal appearing, BMI 22.69 HEENT: oral mucosa pink and moist, NCAT Cardio:HR normalized Chest: normal effort, normal rate of breathing Abd: soft, non-distended Ext: no edema Psych: pleasant, normal affect Skin: intact Neuro: Patient is alert makes eye contact with examiner.  Follows simple commands.  Oriented to state but not city.  He can provide the month but not appropriate year. LUE 3/5 strength, 4/5 throughout otherwise  MSK: left hip TTP  Assessment/Plan: 1. Functional deficits which require 3+ hours per day of interdisciplinary therapy in a comprehensive inpatient rehab setting. Physiatrist is providing close team supervision and 24 hour management of active medical problems listed below. Physiatrist and rehab team continue to assess barriers to discharge/monitor patient progress toward functional and medical goals  Care Tool:  Bathing    Body parts bathed by patient: Face, Chest, Abdomen, Left upper leg, Right upper leg, Left arm   Body parts bathed by helper: Right arm, Right lower leg, Left lower leg, Buttocks     Bathing assist  Assist Level: Moderate Assistance - Patient 50 - 74%     Upper Body Dressing/Undressing Upper body dressing   What is the patient wearing?: Pull over shirt    Upper body assist Assist Level: Moderate Assistance - Patient 50 - 74%    Lower Body Dressing/Undressing Lower body dressing      What is the patient wearing?: Incontinence brief, Pants     Lower body assist Assist for lower body dressing: Maximal Assistance - Patient 25 - 49%     Toileting Toileting    Toileting assist Assist for toileting: Maximal Assistance - Patient 25 - 49%     Transfers Chair/bed transfer  Transfers assist     Chair/bed transfer assist level: Moderate Assistance - Patient 50 - 74% Chair/bed transfer assistive device: Museum/gallery exhibitions officer assist      Assist level: Minimal Assistance - Patient > 75% Assistive device: Parallel bars Max distance: 77ft   Walk 10 feet activity   Assist  Walk 10 feet activity did not occur: Safety/medical concerns        Walk 50 feet activity   Assist Walk 50 feet with 2 turns activity did not occur: Safety/medical concerns         Walk 150 feet activity   Assist Walk 150 feet activity did not occur:  Safety/medical concerns         Walk 10 feet on uneven surface  activity   Assist Walk 10 feet on uneven surfaces activity did not occur: Safety/medical concerns         Wheelchair     Assist Is the patient using a wheelchair?: Yes Type of Wheelchair: Manual    Wheelchair assist level: Dependent - Patient 0%      Wheelchair 50 feet with 2 turns activity    Assist        Assist Level: Dependent - Patient 0%   Wheelchair 150 feet activity     Assist      Assist Level: Dependent - Patient 0%   Blood pressure 112/73, pulse 63, temperature 97.7 F (36.5 C), temperature source Oral, resp. rate 18, height 5\' 9"  (1.753 m), weight 69.7 kg, SpO2 99 %.    Medical Problem List and  Plan: 1. 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  MRI 12/27 slight interval expansion of previously an acute right pontine infarction             -patient may shower             -ELOS/Goals: 10-14 days             Continue CIR  Homocysteine reviewed from 12/7 and is 12.2  -Interdisciplinary Team Conference today   2.  Impaired mobility: continue Lovenox             -antiplatelet therapy: Aspirin 81 mg daily and Brilinta 90 mg twice daily 3. Left hip pain: tramadol added prn. Tylenol as needed. Aquatheramia ordered 4. Dementia: continue Aricept 10 mg nightly, Namenda 10 mg twice daily             -antipsychotic agents: N/A 5. Neuropsych/cognition: This patient is not capable of making decisions on his own behalf. 6. Skin/Wound Care: Routine skin checks 7. Fluids/Electrolytes/Nutrition: Routine in and outs with follow-up chemistries 8.  Hypertension.  Continue lisinopril 5 mg daily.  Monitor with increased mobility. DBP is below goal given stenosis, d/c norvasc.  9.  Hyperlipidemia.  Lipitor, LDL reviewed and is 28.  10.  AKI on CKD stage III.  AKI felt to be related to hypotension.  Follow-up chemistries.  Baseline creatinine 1.38-1.86. Cr reviewed and is 1.58.  11.  Diabetes mellitus.  Hemoglobin A1c 6.5.  Resume glipizide 2.5 mg daily. Magnesium reviewed and is normal.  12. Vitamin D deficiency: start ergocalciferol 50,000U once per week 13. Fatigue: B12 reviewed and elevated. Metanx started 14. Decreased appetite: Mirtazepine started at night  15. Flat affect: Mirtazepine ordered HS  LOS: 6 days A FACE TO FACE EVALUATION WAS PERFORMED  Mitchell Washington 08/28/2022, 10:27 AM

## 2022-08-28 NOTE — Patient Care Conference (Signed)
Inpatient RehabilitationTeam Conference and Plan of Care Update Date: 08/28/2022   Time: 11:08 AM    Patient Name: Mitchell Washington      Medical Record Number: [id number]  Date of Birth: 10/30/1950 Sex: Male         Room/Bed: 4W21C/4W21C-01 Payor Info: Payor: [employer] MEDICARE / Plan: [employer] HMO / Product Type: *No Product type* /    Admit Date/Time:  08/22/2022  4:42 PM  Primary Diagnosis:  Right pontine cerebrovascular accident [facility])  Hospital Problems: Principal Problem:   Right pontine cerebrovascular accident [facility])    Expected Discharge Date: Expected Discharge Date: 09/03/22  Team Members Present: Physician leading conference: Dr. Leeroy Cha Social Worker Present: Ovidio Kin, LCSW Nurse Present: Dorien Chihuahua, RN PT Present: Ginnie Smart, PT OT Present: Other (comment) Antony Salmon, OT) SLP Present: Helaine Chess, SLP PPS Coordinator present : Gunnar Fusi, SLP     Current Status/Progress Goal Weekly Team Focus  Bowel/Bladder   Continent of b/b   remain continent   Assist w/ toileting q 2-4 hrs and prn    Swallow/Nutrition/ Hydration   Not addressing - consistent with baseline. Poor appetite noted with this admission. Suspect cognitive deficits may impact pt's intake and acceptance of PO.   N/A  N/A    ADL's   Max assist ADL, Limited use of LUE, FATIGUES QUICKLY   Min assist   NMR L side, increase activity tolerance, balance, ADL Participation/Independence    Mobility   Without hospital bed features requires maxA for bed mobility, otherwise minA. May need hospital bed at DC. min to [facility] for sit<>stands to RW. CGA/minA gait short distances ~69ft using RW. Heavy modA for stairs. Very apathetic towards therapy, needs max encouragement daily. Learned helplesness.   minA overall  DC planning, gait training, endurance and strength training    Communication   Mod-Max A to consistently utilize strategies during structured and unstructured tasks.  No generalization of skills noted. Without context, pt is essentially unintelligible.   Min A for increasing speech intelligibility to 80% at the phrase level.   Multimodal cueing to improve awareness and recall of strategies to aid in comprehensibility. Family education    Safety/Cognition/ Behavioral Observations  Not addresssing - baseline cognitive deficits that appear stable   N/A   N/A    Pain   no c/o pain   Remain pain free   Assess pain qshift and prn    Skin   Dry and flaky, skin intact   Maintain skin integrity  Assess skin qshift and prn      Discharge Planning:  Wife stays here to assist pt with his dementia. Was recently here and hopes to get back to min assist level.   Team Discussion: Patient with hx of dementia , previous stroke with baseline cognitive deficits; demonstrating poor carry over, fatigue, poor initiation with a flat affect and apathetic demeanor post right pontine CVA.  Patient on target to meet rehab goals: Currently patient needs max assist for ADLs with no functional use of the left UE and left inattention.  Needs constant encouragement to initiate activity and participate in therapy sessions.  Needs max assist for supine to sit and min assist for supine to sit using the bed features. Able to ambulate up to 100' with min assist using a RW.  Cognitive based initiation tools; reasoning why he needs to eat or dress, etc. Needs mod - max assist for communication and without context; speech is unintelligible. Goals for discharge set  for min - mod assist overall.  *See Care Plan and progress notes for long and short-term goals.   Revisions to Treatment Plan:  MD adjusting meds Added appetite stimulant  Palliative Care Consult recommended   Teaching Needs: Safety, medications, dietary modifications, transfers, toileting, etc.   Current Barriers to Discharge: Decreased caregiver support and Behavior  Possible Resolutions to Barriers: Recommend  SNF/Home with support resources     Medical Summary Current Status: CVA, decreased appetite, resting hypertension, orthostatic hypotension, elevated CBGs, flat affect, daytime fatigue  Barriers to Discharge: Medical stability  Barriers to Discharge Comments: CVA, decreased appetite, resting hypertension, orthostatic hypotension, elevated CBGs, flat affect, daytime fatigue Possible Resolutions to [employer]: will not start megace due to stroke risk. will start mirtazepine to stimulate appetite, decrease Lisinopril to 2.5mg  daily, mirtazepine started for depression as well   Continued Need for Acute Rehabilitation Level of Care: The patient requires daily medical management by a physician with specialized training in physical medicine and rehabilitation for the following reasons: Direction of a multidisciplinary physical rehabilitation program to maximize functional independence : Yes Medical management of patient stability for increased activity during participation in an intensive rehabilitation regime.: Yes Analysis of laboratory values and/or radiology reports with any subsequent need for medication adjustment and/or medical intervention. : Yes   I attest that I was present, lead the team conference, and concur with the assessment and plan of the team.   Dorien Chihuahua B 08/28/2022, 4:40 PM

## 2022-08-29 ENCOUNTER — Other Ambulatory Visit (HOSPITAL_COMMUNITY): Payer: Self-pay

## 2022-08-29 DIAGNOSIS — I6350 Cerebral infarction due to unspecified occlusion or stenosis of unspecified cerebral artery: Secondary | ICD-10-CM | POA: Diagnosis not present

## 2022-08-29 LAB — GLUCOSE, CAPILLARY
Glucose-Capillary: 88 mg/dL (ref 70–99)
Glucose-Capillary: 117 mg/dL — ABNORMAL HIGH (ref 70–99)
Glucose-Capillary: 129 mg/dL — ABNORMAL HIGH (ref 70–99)
Glucose-Capillary: 107 mg/dL — ABNORMAL HIGH (ref 70–99)

## 2022-08-29 NOTE — Progress Notes (Signed)
Patient ID: Mitchell Washington, male   DOB: [birth year], 72 y.o.   MRN: [id number]  Wife had called earlier to let worker know she will be here to attend therapies with pt today. Discussed needing to see him in therapies for ADL's and mobility goals. Wife seems to believe she can do what she was doing before for him, since he as just here. Needs to practice stairs into home it this is a doable for wife and pt or daughter and pt.

## 2022-08-29 NOTE — Progress Notes (Signed)
PROGRESS NOTE   Subjective/Complaints: Patient has no new complaints this morning PT asks if he can be changed to 15/7 Appears alert this morning, discussed with SLP  ROS: +left hip pain, +fatigue, +decreased appetite, +dizziness with therapy   Objective:   No results found. No results for input(s): "WBC", "HGB", "HCT", "PLT" in the last 72 hours.  No results for input(s): "NA", "K", "CL", "CO2", "GLUCOSE", "BUN", "CREATININE", "CALCIUM" in the last 72 hours.   Intake/Output Summary (Last 24 hours) at 08/29/2022 1138 Last data filed at 08/29/2022 0847 Gross per 24 hour  Intake 425.85 ml  Output --  Net 425.85 ml        Physical Exam: Vital Signs Blood pressure 129/62, pulse 67, temperature 98 F (36.7 C), temperature source Oral, resp. rate 20, height 5\' 9"  (1.753 m), weight 69.7 kg, SpO2 100 %. Gen: no distress, normal appearing, BMI 22.69 HEENT: oral mucosa pink and moist, NCAT Cardio:HR normalized Chest: normal effort, normal rate of breathing Abd: soft, non-distended Ext: no edema Psych: pleasant, normal affect Skin: intact Neuro: Patient is alert makes eye contact with examiner.  Follows simple commands.  Oriented to state but not city.  He can provide the month but not appropriate year. LUE 3/5 strength, 4/5 throughout otherwise  MSK: left hip TTP GU: incontinent of urine  Assessment/Plan: 1. Functional deficits which require 3+ hours per day of interdisciplinary therapy in a comprehensive inpatient rehab setting. Physiatrist is providing close team supervision and 24 hour management of active medical problems listed below. Physiatrist and rehab team continue to assess barriers to discharge/monitor patient progress toward functional and medical goals  Care Tool:  Bathing    Body parts bathed by patient: Face, Chest, Abdomen, Left upper leg, Right upper leg, Left arm   Body parts bathed by helper: Right  arm, Right lower leg, Left lower leg, Buttocks     Bathing assist Assist Level: Moderate Assistance - Patient 50 - 74%     Upper Body Dressing/Undressing Upper body dressing   What is the patient wearing?: Pull over shirt    Upper body assist Assist Level: Moderate Assistance - Patient 50 - 74%    Lower Body Dressing/Undressing Lower body dressing      What is the patient wearing?: Incontinence brief, Pants     Lower body assist Assist for lower body dressing: Maximal Assistance - Patient 25 - 49%     Toileting Toileting    Toileting assist Assist for toileting: Maximal Assistance - Patient 25 - 49%     Transfers Chair/bed transfer  Transfers assist     Chair/bed transfer assist level: Moderate Assistance - Patient 50 - 74% Chair/bed transfer assistive device: Museum/gallery exhibitions officer assist      Assist level: Minimal Assistance - Patient > 75% Assistive device: Parallel bars Max distance: 90ft   Walk 10 feet activity   Assist  Walk 10 feet activity did not occur: Safety/medical concerns        Walk 50 feet activity   Assist Walk 50 feet with 2 turns activity did not occur: Safety/medical concerns         Walk  150 feet activity   Assist Walk 150 feet activity did not occur: Safety/medical concerns         Walk 10 feet on uneven surface  activity   Assist Walk 10 feet on uneven surfaces activity did not occur: Safety/medical concerns         Wheelchair     Assist Is the patient using a wheelchair?: Yes Type of Wheelchair: Manual    Wheelchair assist level: Dependent - Patient 0%      Wheelchair 50 feet with 2 turns activity    Assist        Assist Level: Dependent - Patient 0%   Wheelchair 150 feet activity     Assist      Assist Level: Dependent - Patient 0%   Blood pressure 129/62, pulse 67, temperature 98 F (36.7 C), temperature source Oral, resp. rate 20, height 5\' 9"   (1.753 m), weight 69.7 kg, SpO2 100 %.    Medical Problem List and Plan: 1. 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  MRI 12/27 slight interval expansion of previously an acute right pontine infarction             -patient may shower             -ELOS/Goals: 10-14 days            Continue CIR  Homocysteine reviewed from 12/7 and is 12.2  -Interdisciplinary Team Conference today   2.  Impaired mobility: continue Lovenox             -antiplatelet therapy: Aspirin 81 mg daily and Brilinta 90 mg twice daily 3. Left hip pain: tramadol added prn. Tylenol as needed. Aquatheramia ordered 4. Dementia: continue Aricept 10 mg nightly, Namenda 10 mg twice daily             -antipsychotic agents: N/A 5. Neuropsych/cognition: This patient is not capable of making decisions on his own behalf. 6. Skin/Wound Care: Routine skin checks 7. Fluids/Electrolytes/Nutrition: Routine in and outs with follow-up chemistries 8.  Hypertension.  Continue lisinopril 5 mg daily.  Monitor with increased mobility. DBP is below goal given stenosis, d/c norvasc. BP reviewed and well controlled 9.  Hyperlipidemia.  Lipitor, LDL reviewed and is 28.  10.  AKI on CKD stage III.  AKI felt to be related to hypotension.  Follow-up chemistries.  Baseline creatinine 1.38-1.86. Cr reviewed and is 1.58. d/c lisinopril 11.  Diabetes mellitus.  Hemoglobin A1c 6.5.  Resume glipizide 2.5 mg daily. Magnesium reviewed and is normal.  12. Vitamin D deficiency: start ergocalciferol 50,000U once per week 13. Fatigue: B12 reviewed and elevated. Metanx started 14. Decreased appetite: Mirtazepine started at night , discussed with SLP. 15. Flat affect: Mirtazepine ordered HS, discussed with therapy that this could make him sleepier in the day.  LOS: 7  days A FACE TO FACE EVALUATION WAS PERFORMED  Clide Deutscher Andriea Hasegawa 08/29/2022, 11:38 AM

## 2022-08-29 NOTE — TOC Benefit Eligibility Note (Signed)
Patient Advocate Encounter  Insurance verification completed.    The patient is currently admitted and upon discharge could be taking Brilinta 90 mg.  The current 30 day co-pay is $47.00.   The patient is insured through [employer] Medicare Part D   Demarrius Guerrero, CPHT Pharmacy Patient Advocate Specialist [facility] Patient Advocate Team Direct Number: [phone]  Fax: [phone]       

## 2022-08-29 NOTE — Progress Notes (Signed)
Occupational Therapy Session Note  Patient Details  Name: Mitchell Washington MRN: [id number] Date of Birth: February 22, 1951  Today's Date: 08/29/2022 OT Individual Time: 0258-5277 OT Individual Time Calculation (min): 58 min    Short Term Goals: Week 1:  OT Short Term Goal 1 (Week 1): Patient will don pants with mod assist OT Short Term Goal 2 (Week 1): Patient will dress upper body with min assist OT Short Term Goal 3 (Week 1): Patient will bathe upper body self  min assist OT Short Term Goal 4 (Week 1): Pt will complete toilet transfer with Min A.  Skilled Therapeutic Interventions/Progress Updates:   Pt greeted supine in bed, pt agreeable to OT intervention. Pt reports being "wet and cold," noted pts brief to be wet. Pt completed supine>sit with MINA  to scoot hips to EOB.  Pt stood with MIN A to doff wet, brief changed in standing with total A. Pt donned pants via sit>stand with MAX A, pt did assist some with pulling pants to waist line in standing with LUE.  Pt completed functional ambulation to sink with Rw and MINA. Pt stood at sink to wash face with CGA for balance, pt utilized RUE to turn on water but needed MAX cues to locate warm water. Donned shoes and teds from w/c with total A.   Total A transport to apt with pt completing ambulatory transfer to household furniture items such as recliner with RW and [place]. Pt continues to have difficulty avoiding obstacles on L side and needs step by step cues for sequence steps for sit>stand transfers. Pt needed MOD A to stand from low recliner. Worked on functional grasp and realase in [place] with LUE with pt using hand over hand assist/gross grasp to reach for fruit items. Graded task up and had pt complete task from standing. Pt reprots fatigue after ~ 2 mins needing to sit down and rest.                    Ended session with pt supine in bed with all needs within reach and bed alarm activated.                    Therapy Documentation Precautions:   Precautions Precautions: Fall Precaution Comments: L hemiparesis, dementia Restrictions Weight Bearing Restrictions: No    Pain: No pain    Therapy/Group: Individual Therapy  Corinne Ports [facility] 08/29/2022, 10:45 AM

## 2022-08-29 NOTE — Progress Notes (Signed)
Physical Therapy Session Note  Patient Details  Name: Mitchell Washington MRN: [id number] Date of Birth: [birth year]  Today's Date: 08/29/2022 PT Individual Time: 1045-1200 + 1445-1500 (missed 30 minutes due to refusal) PT Individual Time Calculation (min): 75 min  + 15 min  Short Term Goals: Week 1:  PT Short Term Goal 1 (Week 1): Pt will complete bed mobility with minA PT Short Term Goal 2 (Week 1): Pt will ambulate 85ft with minA and LRAD PT Short Term Goal 3 (Week 1): Pt will safely navigate up/down x4 steps with minA  Skilled Therapeutic Interventions/Progress Updates:      1st session: Pt in bed to start - needs max encouragement to participate therapy session. No reports of pain. ++ time needed throughout session for coaxing to participate and rest breaks for fatigue.   Supine<>sitting EOB with modA for trunk elevation, HOB raised. Pt with learned helplessness and frequently providing excuses for why he can't mobilize ("the room isn't setup correctly"). Needs assist for forward scoot to EOB to reach feet flat. Stand pivot transfer with modA from EOB to w/c, towards his weaker L side, cues needed for stepping with his L and to avoid "flopping/falling" into his wheelchair. Cues and assist also needed to reposition himself in the w/c.  Transported to main rehab gym in w/c. Focused first part of session on stair training. Pt has 3 steps to enter his house. Practiced using the 6inch steps and 1 hand rail on his R. Pt needing heavy modA for safely ascending and descending the stairs. He needed max cues for sequencing, max cues for guiding his hands along rail (leaves them too far behind him). Pt completed 4 + 4 steps (seated rest break) total. Slightly improved during the 2nd set but continued to require significant heavy assistance for safety.   Relayed concerns to CSW and care team via secure chat - recommending if patient discharges home to need non-emergency medical transport.   Instructed  in gait training using RW - pt requires modA for rising to stand from w/c to RW, cues for hand placement to arm rests. Gait training 65ft + 38ft with CGA/minA and RW - primary gait deficits include narrow BOS, slightly flexed trunk, decreased gait speed, decreased step length L.   Kinetron at wheelchair level x8 minutes at L60 cm/sec resistance. Cues for full ROM and increased cadence. Pt needing frequent encouragement to complete. Family (wife, grandson) arriving during session - discussed PT goals, PT POC, DME rec's (hospital bed, w/c), current level of mobility, primary deficits from his new CVA, etc. Will benefit from further education and observation in therapy to fully understand scope of care that patient requires.   Returned to his room and patient remained seated in w/c with family present - all needs met at end of session.    2nd session: Pt in bed to start session. Pt requiring max encouragement to participate. Offered to assist him with showering/bathing or going to the therapy gym. Pt requesting to rest and to not do either - unable to coax or motivate. Pt only agreeable to bed level there-ex.   Completed the following: -1x10 SLR -1x10 heel slides -1x10 hip abd/add -1x10 glut sets -1x10 SAQ w/ pillow bolster *completed bilaterally, cues needed for effort and completing full ROM  Concluded session in bed, alarm on, all needs met.  Therapy Documentation Precautions:  Precautions Precautions: Fall Precaution Comments: L hemiparesis, dementia Restrictions Weight Bearing Restrictions: No General:     Therapy/Group: Individual Therapy  Kathelyn Gombos P Yoshiye Kraft 08/29/2022, 7:35 AM

## 2022-08-29 NOTE — Progress Notes (Signed)
Speech Language Pathology Weekly Progress Note  Patient Details  Name: Mitchell Washington MRN: [id number] Date of Birth: 11-06-1950  Beginning of progress report period: August 22, 2022 End of progress report period: August 29, 2022   Short Term Goals: Week 1: SLP Short Term Goal 1 (Week 1): Pt will utilize pacing, overarticulation, and increased vocal intensity to improve speech intelligibility to 80% at the phrase level, to communicate wants/needs/ideas and personal information + functional phrases given Mod A multimodal cues. SLP Short Term Goal 1 - Progress (Week 1): Not met SLP Short Term Goal 2 (Week 1): Pt will verbalize 2 techniques/modifications that would be helpful to aid in speech comprehensibility within the context of functional communication tasks with Mod A following education. SLP Short Term Goal 2 - Progress (Week 1): Not met    New Short Term Goals: Week 2: SLP Short Term Goal 1 (Week 2): STG's = LTG's due to ELOS  Weekly Progress Updates: Patient has made minimal gains and has not met any STGs this reporting period. Currently, patient continues to demonstrate a flat affect with encouragement needed at times for participation. Overall, patient is ~60% intelligible at the phrase and sentence level during structured language tasks with Mod A multimodal cues needed for use of speech intelligibility strategies. Intelligibility is further reduced with informal communication with poor utilization of speech intelligibility strategies. Patient education ongoing. Patient would benefit from continued skilled SLP intervention to maximize his functional communication prior to discharge.     Intensity: Minumum of 1-2 x/day, 30 to 90 minutes Frequency: 1 to 3 out of 7 days Duration/Length of Stay: 09/03/22 Treatment/Interventions: Internal/external aids;Functional tasks;Patient/family education;Multimodal communication approach;Speech/Language facilitation;Therapeutic  Activities;Environmental controls;Cueing hierarchy    [place], [place] 08/29/2022, 3:55 PM

## 2022-08-30 DIAGNOSIS — Z66 Do not resuscitate: Secondary | ICD-10-CM

## 2022-08-30 DIAGNOSIS — I6350 Cerebral infarction due to unspecified occlusion or stenosis of unspecified cerebral artery: Secondary | ICD-10-CM | POA: Diagnosis not present

## 2022-08-30 DIAGNOSIS — Z515 Encounter for palliative care: Secondary | ICD-10-CM

## 2022-08-30 DIAGNOSIS — R531 Weakness: Secondary | ICD-10-CM

## 2022-08-30 LAB — GLUCOSE, CAPILLARY
Glucose-Capillary: 93 mg/dL (ref 70–99)
Glucose-Capillary: 132 mg/dL — ABNORMAL HIGH (ref 70–99)
Glucose-Capillary: 99 mg/dL (ref 70–99)
Glucose-Capillary: 120 mg/dL — ABNORMAL HIGH (ref 70–99)

## 2022-08-30 NOTE — Consult Note (Signed)
Consultation Note Date: 08/30/2022   Patient Name: Mitchell Washington  DOB: 09/01/1950  MRN: [id number]  Age / Sex: 72 y.o., male  PCP: Derinda Late, MD Referring Physician: Izora Ribas, MD  Reason for Consultation: Establishing goals of care and Psychosocial/spiritual support  HPI/Patient Profile: 72 y.o. male   admitted on 08/22/2022 with past medical history significant for Type 2 diabetes, HTN, dementia, HLD and gout prior CVAs, most recently 07/15/2022  s/p balloon angioplasty for posterior circulation occlusion, (MRI confirmed multiple small bilateral cerebellar infarcts)  discharged on aspirin and Brilinta who presents to the ED with left-sided weakness that started on 12/25.   Presented [facility]/14/20[facility] to [facility] hospital with acute onset of left-sided weakness as well as hypotension. CT/MRI showed slight interval expansion of the previously seen right pontine infarction, slightly increased in size compared to previous with further posterior extension towards the floor of the fourth ventricle. No associated hemorrhage or mass effect. Previously seen scattered small cerebellar infarcts have largely resolved. No other acute intracranial abnormality. 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 Superimposed significant bilateral supraclinoid ICA atherosclerotic stenosis also redemonstrated and stable. Recent echocardiogram with ejection fraction of 60 to 65% no wall motion abnormalities.   Patient and family face treatment option decisions, advanced directive decisions and anticipatory care needs.    Clinical Assessment and Goals of Care:  This NP Wadie Lessen reviewed medical records, received report from team, assessed the patient and then spoke to wife/[name]  to discuss diagnosis, prognosis, GOC, EOL wishes disposition and  options.  Today is day 7 in Acute rehabilitation, anticipated discharge January 16.  Patient remains limited by multiple deficits; muscle weakness, decreased cardiorespiratory endurance, abnormal tone, imbalance, motor apraxia, decreased coordination, decreased attention, decreased awareness, decreased memory, delayed processing .  Concept of Palliative Care was introduced as specialized medical care for people and their families living with serious illness.  If focuses on providing relief from the symptoms and stress of a serious illness.  The goal is to improve quality of life for both the patient and the family. Values and goals of care important to patient and family were attempted to be elicited.  Created space and opportunity for wife  to explore thoughts and feelings regarding current medical situation.  Patient's wife is the main caregiver, she does have 2 children who live by and are supportive but she recognizes that they work and have a life of their own.  She has insight into patient's increasing care needs in the home, and expresses concerns being the main caregiver.  She is hoping for assistance in the home.  Education offered on limited options outside of out-of-pocket caregivers.  A  discussion was had today regarding advanced directives.  Concepts specific to code status, artifical feeding and hydration, continued IV antibiotics and rehospitalization was had.  The difference between a aggressive medical intervention path  and a palliative comfort care path for this patient,  at this time, and in this situation was  had.     Education offered on the importance of addressing uncertainty and considering best case scenario versus worst-case scenario.  Education offered on the importance and consideration of patient's personal values and goals of care and ongoing communication between family and medical providers to ensure patient centered care.  I encouraged wife to call me on Monday when  she arrives to the patient's room for ongoing discussion regarding next steps in patient's goals of care.  Hopefully at that time we can secure  advanced care planning documents and MOST form.     Questions and concerns addressed.  Patient  encouraged to call with questions or concerns.     PMT will continue to support holistically.     No documented HPOA or ACP documetne    NEXT OF KIN    SUMMARY OF RECOMMENDATIONS    Code Status/Advance Care Planning: DNR Wife verbalizes that she does not think that her husband would ever want a feeding tube.   Palliative Prophylaxis:  Aspiration, Bowel Regimen, Delirium Protocol, Frequent Pain Assessment, and Oral Care  Additional Recommendations (Limitations, Scope, Preferences): Avoid Hospitalization  Psycho-social/Spiritual:  Desire for further Chaplaincy support:no   Prognosis:  Unable to determine  Discharge Planning: To Be Determined      Primary Diagnoses: Present on Admission:  Right pontine cerebrovascular accident [facility])   I have reviewed the medical record, interviewed the patient and family, and examined the patient. The following aspects are pertinent.  Past Medical History:  Diagnosis Date   CVA (cerebral infarction)    Diabetes mellitus without complication ([place])    Gout    Hyperlipidemia    Hypertension    Memory loss    Stroke [facility])    Wears dentures    partial upper   Social History   Socioeconomic History   Marital status: Married    Spouse name: Not on file   Number of children: Not on file   Years of education: Not on file   Highest education level: Not on file  Occupational History   Not on file  Tobacco Use   Smoking status: Former   Smokeless tobacco: Never  Vaping Use   Vaping Use: Never used  Substance and Sexual Activity   Alcohol use: Not Currently    Alcohol/week: 0.0 standard drinks of alcohol   Drug use: Never   Sexual activity: Not on file  Other Topics Concern   Not on  file  Social History Narrative   Not on file   Social Determinants of Health   Financial Resource Strain: Not on file  Food Insecurity: No Food Insecurity (08/16/2022)   Hunger Vital Sign    Worried About Running Out of Food in the Last Year: Never true    Ran Out of Food in the Last Year: Never true  Transportation Needs: No Transportation Needs (08/16/2022)   PRAPARE - Hydrologist (Medical): No    Lack of Transportation (Non-Medical): No  Physical Activity: Not on file  Stress: Not on file  Social Connections: Not on file   Family History  Problem Relation Age of Onset   Diabetes Mother    Scheduled Meds:  aspirin EC  81 mg Oral Daily   atorvastatin  80 mg Oral Daily   donepezil  10 mg Oral QHS   enoxaparin (LOVENOX) injection  40 mg Subcutaneous Q24H   glipiZIDE  2.5 mg Oral QAC breakfast   insulin aspart  0-15 Units Subcutaneous TID WC  l-methylfolate-B6-B12  1 tablet Oral Daily   memantine  10 mg Oral BID   ticagrelor  90 mg Oral BID   Vitamin D (Ergocalciferol)  50,000 Units Oral Q7 days   Continuous Infusions:  sodium chloride 50 mL/hr at 08/28/22 1049   PRN Meds:.acetaminophen **OR** acetaminophen (TYLENOL) oral liquid 160 mg/5 mL **OR** acetaminophen, traMADol Medications Prior to Admission:  Prior to Admission medications   Medication Sig Start Date End Date Taking? Authorizing Provider  acetaminophen (TYLENOL) 325 MG tablet Take 2 tablets (650 mg total) by mouth every 4 (four) hours as needed for mild pain (or temp > 37.5 C (99.5 F)). 08/01/22   Angiulli, Lavon Paganini, PA-C  amLODipine (NORVASC) 2.5 MG tablet Take 1 tablet (2.5 mg total) by mouth daily. 08/01/22   Angiulli, Lavon Paganini, PA-C  aspirin EC 81 MG tablet Take 81 mg by mouth daily.    [provider]  atorvastatin (LIPITOR) 80 MG tablet Take 1 tablet (80 mg total) by mouth daily. 08/02/22   Angiulli, Lavon Paganini, PA-C  cyanocobalamin (VITAMIN B12) 1000 MCG tablet Take 1  tablet (1,000 mcg total) by mouth daily. 08/01/22   Angiulli, Lavon Paganini, PA-C  diclofenac Sodium (VOLTAREN) 1 % GEL Apply 2 g topically 4 (four) times daily. 08/01/22   Angiulli, Lavon Paganini, PA-C  donepezil (ARICEPT) 10 MG tablet Take 10 mg by mouth at bedtime. 08/27/18   [provider]  feeding supplement (ENSURE ENLIVE / ENSURE PLUS) LIQD Take 237 mLs by mouth 2 (two) times daily between meals. 08/20/22   Fritzi Mandes, MD  glipiZIDE (GLUCOTROL XL) 2.5 MG 24 hr tablet Take 1 tablet (2.5 mg total) by mouth daily before breakfast. 08/01/22   Angiulli, Lavon Paganini, PA-C  lisinopril (ZESTRIL) 5 MG tablet Take 1 tablet (5 mg total) by mouth daily. 08/20/22   Fritzi Mandes, MD  memantine (NAMENDA) 10 MG tablet Take 10 mg by mouth 2 (two) times daily. 12/20/19   [provider]  ticagrelor (BRILINTA) 90 MG TABS tablet Take 1 tablet (90 mg total) by mouth 2 (two) times daily. 08/01/22   Angiulli, Lavon Paganini, PA-C   No Known Allergies Review of Systems  Unable to perform ROS: Dementia    Physical Exam Cardiovascular:     Rate and Rhythm: Normal rate.  Pulmonary:     Effort: Pulmonary effort is normal.  Skin:    General: Skin is warm and dry.  Neurological:     Mental Status: He is alert.     Comments: - follows simple commands but with flat affect    Vital Signs: BP 110/66 (BP Location: Right Arm)   Pulse 70   Temp 97.7 F (36.5 C)   Resp 16   Ht 5\' 9"  (1.753 m)   Wt 69.7 kg   SpO2 100%   BMI 22.69 kg/m  Pain Scale: 0-10   Pain Score: 0-No pain   SpO2: SpO2: 100 % O2 Device:SpO2: 100 % O2 Flow Rate: .   IO: Intake/output summary:  Intake/Output Summary (Last 24 hours) at 08/30/2022 1413 Last data filed at 08/30/2022 1231 Gross per 24 hour  Intake 50 ml  Output --  Net 50 ml    LBM: Last BM Date : 08/25/22 Baseline Weight: Weight: 69.7 kg Most recent weight: Weight: 69.7 kg     Palliative Assessment/Data: 30 %    Discussed with treatment team via secure chat  Time  In: 1300 Time Out: 1415 Time Total: 75 minutes Greater than 50%  of this time was spent counseling and coordinating care related to the above assessment and plan.  Signed by: Lorinda Creed, NP   Please contact Palliative Medicine Team phone at [phone] for questions and concerns.  For individual provider: See Loretha Stapler

## 2022-08-30 NOTE — Progress Notes (Signed)
Physical Therapy Weekly Progress Note  Patient Details  Name: Mitchell Washington MRN: [id number] Date of Birth: [birth year]  Beginning of progress report period: August 23, 2022 End of progress report period: August 30, 2022  Today's Date: 08/30/2022 PT Individual Time: 0830-0925 PT Individual Time Calculation (min): 55 min   Patient has met 1 of 3 short term goals.  Pt is making slow progress towards LTG of minA. Goals downgraded to [place] with expected DC on Tuesday 1/16 home with wife and family support. Patient is primarily limited by deficits listed below. He fluctuates b/w mod to maxA for bed mobility, min to modA for sit<>stand transfers, and is ambulating ~199ft with minA and RW. He requires mod to maxA for navigating stairs.   Patient continues to demonstrate the following deficits muscle weakness and muscle joint tightness, decreased cardiorespiratoy endurance, abnormal tone, unbalanced muscle activation, motor apraxia, decreased coordination, and decreased motor planning, decreased midline orientation, decreased attention to left, and decreased motor planning, decreased initiation, decreased attention, decreased awareness, decreased problem solving, decreased safety awareness, decreased memory, and delayed processing, and decreased sitting balance, decreased standing balance, decreased postural control, hemiplegia, and decreased balance strategies and therefore will continue to benefit from skilled PT intervention to increase functional independence with mobility.  Patient progressing toward long term goals..  Plan of care revisions: Downgraded from minA to [place] due to slower than anticipated progress and severity of cognitive impairments.  PT Short Term Goals Week 1:  PT Short Term Goal 1 (Week 1): Pt will complete bed mobility with minA PT Short Term Goal 1 - Progress (Week 1): Not met PT Short Term Goal 2 (Week 1): Pt will ambulate 45ft with minA and LRAD PT Short Term Goal 2 - Progress  (Week 1): Met PT Short Term Goal 3 (Week 1): Pt will safely navigate up/down x4 steps with minA PT Short Term Goal 3 - Progress (Week 1): Not met Week 2:  PT Short Term Goal 1 (Week 2): = LTG due to ELOS  Skilled Therapeutic Interventions/Progress Updates:      Pt asleep in bed on arrival - difficult to arouse. Opened blinds to improve alertness but patient continued to keep eyes shut, would briefly open at times. BP checked: 138/71 HR 78 resting in bed. Alertness improved with time and cues.   Max encouragement and coaxing to participate. Once alert enough to participate, patient with very flat affect - difficult to engage with. Donned knee high compression socks and pants with totalA (pt <25%). Supine<>sitting EOB needing modA for trunk support and for initiation. Pt with x1 LOB posteriorly while donning shoes, insufficient and delayed righting response.  Sit<>stand with modA from EOB to RW with assist needed for steadying and for managing posterior lean. Short distance ambulatory transfer with minA and RW with cues for safety approach and hand placement to control descent.   Transported to main rehab gym for time management.   Sit<>stand to RW from w/c height needing modA due to strong posterior bias - cues needed for hand placement to push from arm rests rather than from RW. Gait training on level surfaces ~112ft + 169ft (seated rest) with CGA/minA and RW - cues for increasing L step length/height, keeping body within walker frame, and steering RW in forwards directions (tends to drift it to the R due to LUE weakness).  Pt reporting sudden urge to void while in therapy gym.  Returned to his room and wheeled in bathroom for urgency. Assisted onto toilet with  modA stand pivot transfer using the grab bars. Patient already incontinent - soaked through brief/pants. Removed and provided pericare. Returned to bed with minA squat<>pivot transfer using hospital bed rail - modA for sit>supine and  totalA for scooting to [facility]. Donned new clean brief at bed level - supervision for rolling L and modA for rolling R. All needs met, alarm on.  *left Merry Proud music playing on computer (pt self selected) to improve affect and mood  *noted plastic tearing on bed rail, risk for injury or skin tear. Notified RN who will be ordering a new hospital bed to replace.  Therapy Documentation Precautions:  Precautions Precautions: Fall Precaution Comments: L hemiparesis, dementia Restrictions Weight Bearing Restrictions: No General:     Therapy/Group: Individual Therapy  Jone Baseman Connar Keating PT, DPT 08/30/2022, 7:42 AM

## 2022-08-30 NOTE — Progress Notes (Incomplete)
Occupational Therapy Session Note  Patient Details  Name: Mitchell Washington MRN: [id number] Date of Birth: 06/19/1951  {CHL IP REHAB OT TIME CALCULATIONS:[id number]}   Short Term Goals: Week 2:  OT Short Term Goal 1 (Week 2): Patient will dress upper body with min assist OT Short Term Goal 2 (Week 2): Patient will don pants with mod assist OT Short Term Goal 3 (Week 2): Pt will complete toilet transfer with Min A.  Skilled Therapeutic Interventions/Progress Updates:   Session 1: Pt received *** for skilled OT session with focus on ***. Pt agreeable to interventions, demonstrating overall *** mood. Pt reported ***/10 pain, stating "***" in reference to ***. OT offering intermediate rest breaks and positioning suggestions throughout session to address pain/fatigue and maximize participation/safety in session.    Pt remained *** with all immediate needs met at end of session. Pt continues to be appropriate for skilled OT intervention to promote further functional independence.   Session 2: Pt received *** for skilled OT session with focus on ***. Pt agreeable to interventions, demonstrating overall *** mood. Pt reported ***/10 pain, stating "***" in reference to ***. OT offering intermediate rest breaks and positioning suggestions throughout session to address pain/fatigue and maximize participation/safety in session.    Pt remained *** with all immediate needs met at end of session. Pt continues to be appropriate for skilled OT intervention to promote further functional independence.    Therapy Documentation Precautions:  Precautions Precautions: Fall Precaution Comments: L hemiparesis, dementia Restrictions Weight Bearing Restrictions: No    Therapy/Group: Individual Therapy  Maudie Mercury, OTR/L, MSOT  08/30/2022, 10:03 PM

## 2022-08-30 NOTE — Progress Notes (Signed)
Occupational Therapy Weekly Progress Note  Patient Details  Name: Mitchell Washington MRN: [id number] Date of Birth: [birth year]  Beginning of progress report period: August 23, 2022 End of progress report period: August 30, 2022  Today's Date: 08/30/2022 OT Individual Time: 8144-8185 OT Individual Time Calculation (min): 46 min    Patient has met 1 of 4 short term goals.  Patient may be making slight progress toward goals although performance is inconsistent.    Patient continues to demonstrate the following deficits: abnormal tone, unbalanced muscle activation, motor apraxia, decreased coordination, and decreased motor planning, decreased midline orientation, decreased attention to left, and decreased motor planning, decreased initiation, decreased attention, decreased awareness, decreased problem solving, decreased safety awareness, decreased memory, and delayed processing, and decreased sitting balance, decreased standing balance, decreased postural control, hemiplegia, and decreased balance strategies and therefore will continue to benefit from skilled OT intervention to enhance overall performance with BADL.  Patient progressing toward long term goals..  Continue plan of care.  OT Short Term Goals Week 1:  OT Short Term Goal 1 (Week 1): Patient will don pants with mod assist OT Short Term Goal 1 - Progress (Week 1): Progressing toward goal OT Short Term Goal 2 (Week 1): Patient will dress upper body with min assist OT Short Term Goal 2 - Progress (Week 1): Progressing toward goal OT Short Term Goal 3 (Week 1): Patient will bathe upper body self  min assist OT Short Term Goal 3 - Progress (Week 1): Met OT Short Term Goal 4 (Week 1): Pt will complete toilet transfer with Min A. OT Short Term Goal 4 - Progress (Week 1): Progressing toward goal Week 2:  OT Short Term Goal 1 (Week 2): Patient will dress upper body with min assist OT Short Term Goal 2 (Week 2): Patient will don pants with mod  assist OT Short Term Goal 3 (Week 2): Pt will complete toilet transfer with Min A.  Skilled Therapeutic Interventions/Progress Updates:    Patient received supine in bed.  Assisted to get out of bed into chair.  Assisted patient to sink for bathing, which patient completed with mod assist and step by step instructions.  Patient dressed himself with mod/min assist and step by step cueing.  Patient assisted back to bed at end of session -ted hose removed.   Patient reporting fatigue today.  Bed alarm engaged and call bell/ personal items in reach.   Therapy Documentation Precautions:  Precautions Precautions: Fall Precaution Comments: L hemiparesis, dementia Restrictions Weight Bearing Restrictions: No  Pain: Pain Assessment Pain Scale: 0-10 Pain Score: 0-No pain    Therapy/Group: Individual Therapy  Mariah Milling 08/30/2022, 1:20 PM

## 2022-08-30 NOTE — Progress Notes (Signed)
PROGRESS NOTE   Subjective/Complaints: No new complaints this morning Patient's chart reviewed- No issues reported overnight Vitals signs stable except for diastolic hypotension and bradycardia  ROS: +left hip pain, +fatigue, +decreased appetite, +dizziness with therapy, +cognitive impairments secondary to dementia as per wife   Objective:   No results found. No results for input(s): "WBC", "HGB", "HCT", "PLT" in the last 72 hours.  No results for input(s): "NA", "K", "CL", "CO2", "GLUCOSE", "BUN", "CREATININE", "CALCIUM" in the last 72 hours.   Intake/Output Summary (Last 24 hours) at 08/30/2022 1131 Last data filed at 08/30/2022 0756 Gross per 24 hour  Intake 20 ml  Output --  Net 20 ml        Physical Exam: Vital Signs Blood pressure (!) 145/54, pulse (!) 57, temperature 97.8 F (36.6 C), resp. rate 17, height 5\' 9"  (1.753 m), weight 69.7 kg, SpO2 100 %. Gen: no distress, normal appearing, BMI 22.69 HEENT: oral mucosa pink and moist, NCAT Cardio:HR normalized Chest: normal effort, normal rate of breathing Abd: soft, non-distended Ext: no edema Psych: pleasant, normal affect Skin: intact Neuro: Patient is alert makes eye contact with examiner.  Follows simple commands.  Oriented to state but not city.  He can provide the month but not appropriate year. LUE 3/5 strength, 4/5 throughout otherwise  Dons shoes with Total A MSK: left hip TTP GU: incontinent of urine  Assessment/Plan: 1. Functional deficits which require 3+ hours per day of interdisciplinary therapy in a comprehensive inpatient rehab setting. Physiatrist is providing close team supervision and 24 hour management of active medical problems listed below. Physiatrist and rehab team continue to assess barriers to discharge/monitor patient progress toward functional and medical goals  Care Tool:  Bathing    Body parts bathed by patient: Face, Chest,  Abdomen, Left upper leg, Right upper leg, Left arm   Body parts bathed by helper: Right arm, Right lower leg, Left lower leg, Buttocks     Bathing assist Assist Level: Moderate Assistance - Patient 50 - 74%     Upper Body Dressing/Undressing Upper body dressing   What is the patient wearing?: Pull over shirt    Upper body assist Assist Level: Moderate Assistance - Patient 50 - 74%    Lower Body Dressing/Undressing Lower body dressing      What is the patient wearing?: Incontinence brief, Pants     Lower body assist Assist for lower body dressing: Maximal Assistance - Patient 25 - 49%     Toileting Toileting    Toileting assist Assist for toileting: Maximal Assistance - Patient 25 - 49%     Transfers Chair/bed transfer  Transfers assist     Chair/bed transfer assist level: Moderate Assistance - Patient 50 - 74% Chair/bed transfer assistive device: Museum/gallery exhibitions officer assist      Assist level: Minimal Assistance - Patient > 75% Assistive device: Parallel bars Max distance: 71ft   Walk 10 feet activity   Assist  Walk 10 feet activity did not occur: Safety/medical concerns        Walk 50 feet activity   Assist Walk 50 feet with 2 turns activity did not occur: Safety/medical  concerns         Walk 150 feet activity   Assist Walk 150 feet activity did not occur: Safety/medical concerns         Walk 10 feet on uneven surface  activity   Assist Walk 10 feet on uneven surfaces activity did not occur: Safety/medical concerns         Wheelchair     Assist Is the patient using a wheelchair?: Yes Type of Wheelchair: Manual    Wheelchair assist level: Dependent - Patient 0%      Wheelchair 50 feet with 2 turns activity    Assist        Assist Level: Dependent - Patient 0%   Wheelchair 150 feet activity     Assist      Assist Level: Dependent - Patient 0%   Blood pressure (!) 145/54,  pulse (!) 57, temperature 97.8 F (36.6 C), resp. rate 17, height 5\' 9"  (1.753 m), weight 69.7 kg, SpO2 100 %.    Medical Problem List and Plan: 1. 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  MRI 12/27 slight interval expansion of previously an acute right pontine infarction             -patient may shower             -ELOS/Goals: 10-14 days            Continue CIR  Homocysteine reviewed from 12/7 and is 12.2  -Interdisciplinary Team Conference today   2.  Impaired mobility: continue Lovenox             -antiplatelet therapy: Aspirin 81 mg daily and Brilinta 90 mg twice daily 3. Left hip pain: tramadol added prn. Tylenol as needed. Aquatheramia ordered 4. Dementia: continue Aricept 10 mg nightly, Namenda 10 mg twice daily             -antipsychotic agents: N/A 5. Neuropsych/cognition: This patient is not capable of making decisions on his own behalf. 6. Skin/Wound Care: Routine skin checks 7. Fluids/Electrolytes/Nutrition: Routine in and outs with follow-up chemistries 8.  Hypertension.  d/c Lisinopril since resting BP is below goal and patient had symptomatic hypotension Monitor with increased mobility. DBP is below goal given stenosis, d/c norvasc. BP reviewed and well controlled 9.  Hyperlipidemia.  Lipitor, LDL reviewed and is 28.  10.  AKI on CKD stage III.  AKI felt to be related to hypotension.  Follow-up chemistries.  Baseline creatinine 1.38-1.86. Cr reviewed and is 1.58. d/c lisinopril 11.  Diabetes mellitus.  Hemoglobin A1c 6.5.  Resume glipizide 2.5 mg daily. Magnesium reviewed and is normal.  12. Vitamin D deficiency: start ergocalciferol 50,000U once per week 13. Fatigue: B12 reviewed and elevated. Metanx started. Appears to have worsened since starting mirtazepine, will d/c 14. Decreased  appetite: Mirtazepine started at night but appears to have worsened fatigued to have d/ced 15. Flat affect: Mirtazepine ordered HS, but this appears to have worsened fatigue, so d/ced  LOS: 8 days A FACE TO FACE EVALUATION WAS PERFORMED  Mitchell Washington 08/30/2022, 11:31 AM

## 2022-08-31 LAB — GLUCOSE, CAPILLARY
Glucose-Capillary: 100 mg/dL — ABNORMAL HIGH (ref 70–99)
Glucose-Capillary: 109 mg/dL — ABNORMAL HIGH (ref 70–99)
Glucose-Capillary: 99 mg/dL (ref 70–99)
Glucose-Capillary: 113 mg/dL — ABNORMAL HIGH (ref 70–99)

## 2022-08-31 MED ORDER — ONDANSETRON 4 MG PO TBDP
4.0000 mg | ORAL_TABLET | Freq: Three times a day (TID) | ORAL | Status: DC | PRN
  Administered 2022-09-01: 4 mg via ORAL
  Filled 2022-08-31: qty 1

## 2022-08-31 NOTE — Progress Notes (Signed)
Physical Therapy Session Note  Patient Details  Name: OLIN GURSKI MRN: [id number] Date of Birth: Apr 08, 1951  Today's Date: 08/31/2022 PT Individual Time: 1445-      Short Term Goals: Week 2:  PT Short Term Goal 1 (Week 2): = LTG due to ELOS  Skilled Therapeutic Interventions/Progress Updates:    Chart reviewed. Pt found semi-reclined in bed. Pt verbalized not wanting to participate in PT. PT explained need to participate in rehab. Pt again refused. PT explained need to prepare for pending discharge. Pt verbalized he resting and will not participate in PT. PT again explained concern if pt has repeated refusals. Pt again refused therapy. Pt was left semi-reclined in bed with alarm engaged, nurse call bell and all needs in reach.     Therapy Documentation Precautions:  Precautions Precautions: Fall Precaution Comments: L hemiparesis, dementia Restrictions Weight Bearing Restrictions: No General: PT Amount of Missed Time (min): 60 Minutes PT Missed Treatment Reason: Patient unwilling to participate    Therapy/Group: Individual Therapy  Marquette Old, PT, DPT 08/31/2022, 2:55 PM

## 2022-08-31 NOTE — Progress Notes (Signed)
Occupational Therapy Session Note  Patient Details  Name: Mitchell Washington MRN: [id number] Date of Birth: 10-Aug-1951  {CHL IP REHAB OT TIME CALCULATIONS:304400400}   Short Term Goals: Week 2:  OT Short Term Goal 1 (Week 2): Patient will dress upper body with min assist OT Short Term Goal 2 (Week 2): Patient will don pants with mod assist OT Short Term Goal 3 (Week 2): Pt will complete toilet transfer with Min A.  Skilled Therapeutic Interventions/Progress Updates:  Pt received *** for skilled OT session with focus on ***. Pt agreeable to interventions, demonstrating overall *** mood. Pt reported ***/10 pain, stating "***" in reference to ***. OT offering intermediate rest breaks and positioning suggestions throughout session to address pain/fatigue and maximize participation/safety in session.   Pt remained *** with all immediate needs met at end of session. Pt continues to be appropriate for skilled OT intervention to promote further functional independence.   Therapy Documentation Precautions:  Precautions Precautions: Fall Precaution Comments: L hemiparesis, dementia Restrictions Weight Bearing Restrictions: No  Therapy/Group: Individual Therapy  Maudie Mercury, OTR/L, MSOT  08/31/2022, 8:26 PM

## 2022-09-01 LAB — GLUCOSE, CAPILLARY
Glucose-Capillary: 102 mg/dL — ABNORMAL HIGH (ref 70–99)
Glucose-Capillary: 110 mg/dL — ABNORMAL HIGH (ref 70–99)
Glucose-Capillary: 138 mg/dL — ABNORMAL HIGH (ref 70–99)
Glucose-Capillary: 118 mg/dL — ABNORMAL HIGH (ref 70–99)

## 2022-09-01 MED ORDER — SORBITOL 70 % SOLN: 30.0000 mL | Freq: Once | Status: DC

## 2022-09-01 MED ORDER — SENNA 8.6 MG PO TABS
1.0000 | ORAL_TABLET | Freq: Two times a day (BID) | ORAL | Status: DC
  Administered 2022-09-01 – 2022-09-03 (×5): 8.6 mg via ORAL
  Filled 2022-09-01 (×4): qty 1

## 2022-09-01 NOTE — Discharge Summary (Signed)
Physician Discharge Summary  Patient ID: Mitchell Washington MRN: [id number] DOB/AGE: 04-24-1951 72 y.o.  Admit date: 08/22/2022 Discharge date: 09/03/2022  Discharge Diagnoses:  Principal Problem:   Right pontine cerebrovascular accident [facility]) Active Problems:   Weakness generalized   Palliative care by specialist   DNR (do not resuscitate) DVT prophylaxis Dementia Hypertension Diabetes mellitus Constipation CKD stage III History of posterior circulation infarction  Discharged Condition: Stable  Significant Diagnostic Studies: CT ANGIO HEAD W OR WO CONTRAST  Result Date: 08/17/2022 CLINICAL DATA:  72 year old male status post bilateral cerebellar infarcts and brainstem about a month ago. Bilateral PCA occlusions. Status post endovascular treatment of high-grade Basilar artery and vertebrobasilar stenoses. EXAM: CT ANGIOGRAPHY HEAD TECHNIQUE: Multidetector CT imaging of the head was performed using the standard protocol during bolus administration of intravenous contrast. Multiplanar CT image reconstructions and MIPs were obtained to evaluate the vascular anatomy. RADIATION DOSE REDUCTION: This exam was performed according to the departmental dose-optimization program which includes automated exposure control, adjustment of the mA and/or kV according to patient size and/or use of iterative reconstruction technique. CONTRAST:  57mL OMNIPAQUE IOHEXOL 350 MG/ML SOLN COMPARISON:  Head CT 08/14/2022. Brain MRI yesterday.  Post treatment intracranial MRA 07/19/2022. FINDINGS: CT HEAD Brain: Stable non contrast CT appearance of the brain. No acute intracranial hemorrhage identified. No midline shift, mass effect, or evidence of intracranial mass lesion. Chronic cerebellar, brainstem, left greater than right PCA, and left MCA territory infarcts. Calvarium and skull base: No acute osseous abnormality identified. Paranasal sinuses: Stable paranasal sinuses. Tympanic cavities and mastoids remain clear.  Orbits: No acute orbit or scalp soft tissue finding. CTA HEAD Posterior circulation: Dominant left vertebral V4 segment. Severe distal vertebral artery atherosclerosis and tandem stenoses. Functionally occluded distal right vertebral at the vertebrobasilar junction, unchanged. Enhancement of the distal left vertebral artery is mildly improved compared to 07/15/2022. Left PICA origin remains patent. Severe proximal basilar artery stenosis persists (series 11 image 132 and series 13, image 22 today. This appears slightly improved since prior CTA. Basilar tip and SCA origins remain patent. Highly irregular PCA origins are patent, with improved bilateral PCA enhancement especially on the right. Left PCA P3 branches remain attenuated. Posterior communicating arteries are diminutive or absent. Anterior circulation: Both ICA siphons are patent. On the right moderate calcified plaque, and moderate to severe short segment supraclinoid stenosis (series 16, image 97) appears stable. Right ICA terminus remains patent. On the left similar bulky supraclinoid plaque and at least moderate supraclinoid stenosis (series 16, image 115) appears stable. Left ICA terminus remains patent. MCA and ACA origins remain patent. Severe left A1 stenosis appears stable. Anterior communicating artery and bilateral ACA branches remain within normal limits. Left MCA M1 segment is patent with mild irregularity at the bifurcation. Left MCA branches are stable and within normal limits. Right MCA M1 segment bifurcates early without stenosis. Right MCA branches are stable with mild posterior M2 segment irregularity. Venous sinuses: Early contrast timing, superior sagittal sinus is patent. Anatomic variants: Dominant left vertebral artery. Review of the MIP images confirms the above findings IMPRESSION: 1. 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 Mildly improved appearance of the pronounced distal Left Vertebral Artery and mid  Basilar artery stenoses since 07/15/2022, but hemodynamically significant narrowing remains. Mildly improved appearance also bilateral PCA enhancement, distal left PCA branches remain occluded. 2. Superimposed significant bilateral supraclinoid ICA atherosclerotic stenoses also re-demonstrated and stable. Anterior circulation remains patent. Stable moderate to severe Left A1 stenosis. 3. Stable CT appearance of  the brain, no new intracranial abnormality. Electronically Signed   By: Genevie Ann M.D.   On: 08/17/2022 12:40   MR BRAIN WO CONTRAST  Result Date: 08/14/2022 CLINICAL DATA:  Initial evaluation for neuro deficit, stroke suspected. Recent hospitalization for stroke, status post posterior circulation angioplasty. EXAM: MRI HEAD WITHOUT CONTRAST TECHNIQUE: Multiplanar, multiecho pulse sequences of the brain and surrounding structures were obtained without intravenous contrast. COMPARISON:  Prior CT from earlier the same day as well as recent MRI from 12/17/2021. FINDINGS: Brain: Age-related cerebral atrophy. Patchy and confluent T2/FLAIR hyperintensity involving the periventricular deep white matter both cerebral hemispheres, consistent with chronic small vessel ischemic disease. Patchy involvement of the pons. Remote hemorrhagic lacunar infarct noted at the left basal ganglia. Remote bilateral PCA territory infarcts, left larger than right noted. Few scattered remote bilateral cerebellar infarcts. Previously seen small scattered cerebellar infarcts have largely resolved, with only faint residual punctate diffusion signal abnormality at the left cerebellum (series 5, image 9). There has been interval expansion of the previously a seen right pontine infarct, slightly increased in size as compared to previous exam, with further posterior extension towards the floor of the fourth ventricle (series 5, image 15). Degree of new/interval infarction best appreciated on ADC map (series 6, image 15). No associated  hemorrhage or mass effect. No other evidence for new or interval infarct elsewhere. No acute intracranial hemorrhage. No mass lesion, midline shift or mass effect. Mild ex vacuo dilatation of the left lateral ventricle related to the chronic left basal ganglia infarct. No hydrocephalus. No extra-axial fluid collection. Pituitary gland and suprasellar region within normal limits. 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 Major intracranial vascular flow voids are otherwise maintained. Skull and upper cervical spine: Craniocervical junction within normal limits. Bone marrow signal intensity heterogeneous but overall within normal limits. No scalp soft tissue abnormality. Sinuses/Orbits: Prior bilateral ocular lens replacement. Scattered mucosal thickening noted about the sphenoethmoidal and maxillary sinuses. Small right maxillary sinus retention cyst. No mastoid effusion. Other: None. IMPRESSION: 1. Slight interval expansion of the previously seen right pontine infarct, slightly increased in size as compared to previous, with further posterior extension towards the floor of the fourth ventricle. No associated hemorrhage or mass effect. 2. Previously seen scattered small cerebellar infarcts have largely resolved, with only faint residual diffusion signal abnormality at the left cerebellum. 3. No other new acute intracranial abnormality. 4. Underlying atrophy with chronic microvascular ischemic disease, with additional remote infarcts involving the left basal ganglia, bilateral PCA territories, and bilateral cerebellar hemispheres. Electronically Signed   By: Jeannine Boga M.D.   On: 08/14/2022 22:47   DG Chest Portable 1 View  Result Date: 08/14/2022 CLINICAL DATA:  Weakness EXAM: PORTABLE CHEST 1 VIEW COMPARISON:  07/15/2022 FINDINGS: Heart and mediastinal contours are within normal  limits. No focal opacities or effusions. No acute bony abnormality. Aortic atherosclerosis. IMPRESSION: No active cardiopulmonary disease. Electronically Signed   By: Rolm Baptise M.D.   On: 08/14/2022 20:03   CT HEAD WO CONTRAST (5MM)  Result Date: 08/14/2022 CLINICAL DATA:  TIA EXAM: CT HEAD WITHOUT CONTRAST TECHNIQUE: Contiguous axial images were obtained from the base of the skull through the vertex without intravenous contrast. RADIATION DOSE REDUCTION: This exam was performed according to the departmental dose-optimization program which includes automated exposure control, adjustment of the mA and/or kV according to patient size and/or use of iterative reconstruction technique. COMPARISON:  None Available.  FINDINGS: Brain: Old left occipital infarct. Old left basal ganglia lacunar infarct. There is atrophy and chronic small vessel disease changes. No acute intracranial abnormality. Specifically, no hemorrhage, hydrocephalus, mass lesion, acute infarction, or significant intracranial injury. Vascular: No hyperdense vessel or unexpected calcification. Skull: No acute calvarial abnormality. Sinuses/Orbits: No acute findings Other: None IMPRESSION: Old left infarct as above. Atrophy, chronic microvascular disease. No acute intracranial abnormality. Electronically Signed   By: Charlett Nose M.D.   On: 08/14/2022 15:29    Labs:  Basic Metabolic Panel: Recent Labs  Lab 09/02/22 0646  NA 140  K 5.0  CL 107  CO2 22  GLUCOSE 106*  BUN 44*  CREATININE 1.93*  CALCIUM 9.5    CBC: Recent Labs  Lab 09/02/22 0646  WBC 7.3  NEUTROABS 4.8  HGB 12.4*  HCT 38.8*  MCV 83.8  PLT 516*    CBG: Recent Labs  Lab 09/02/22 0500 09/02/22 0623 09/02/22 1126 09/02/22 1634 09/02/22 2127  GLUCAP 112* 92 99 71 80   Family history.  Mother with diabetes.  Denies any colon cancer esophageal cancer or rectal cancer  Brief HPI:   TYWON NIDAY is a 72 y.o. right-handed male with history of diabetes  mellitus, CKD stage III, baseline creatinine 1.38-1.86 hypertension dementia maintained on Aricept as well as Namenda, posterior circulation infarction status post balloon angioplasty for posterior circulation occlusion receiving inpatient rehab services 07/24/2022 - 08/02/2022 maintained on aspirin as well as Brilinta and was discharged home ambulating with a rolling walker contact-guard.  Per chart review lives with spouse.  Presented 08/14/2022 to [facility] hospital with acute onset of left-sided weakness as well as hypotension.  CT/MRI showed slight interval expansion of the previously seen right pontine infarction, slightly increased in size compared to previous with further posterior extension towards the floor the fourth ventricle.  No associated hemorrhage or mass effect.  Previously seen scattered small cerebellar infarct have largely resolved.  No other acute intracranial abnormality.  CT angiogram head and neck showed chronic severe posterior circulation atherosclerosis.  Superimposed significant bilateral supraclinoid ICA atherosclerotic stenosis also redemonstrated and stable.  Recent echocardiogram with ejection fraction of 60 to 65% no wall motion abnormality.  Admission chemistries unremarkable except potassium 5.4 BUN 57 creatinine 2.75.  Neurology follow-up remained on aspirin as well as Brilinta for CVA prophylaxis.  Lovenox added for DVT prophylaxis.  AKI CKD stage III elevated creatinine felt to be related to hypotension received IV fluids latest creatinine 1.58.  Therapy evaluations completed due to patient decreased functional mobility left-sided weakness was admitted for a comprehensive rehab program.   Hospital Course: Daud Cayer Geers was admitted to rehab 08/22/2022 for inpatient therapies to consist of PT, ST and OT at least three hours five days a week. Past admission physiatrist, therapy team and rehab RN have worked together to provide customized collaborative inpatient rehab.   Pertaining to patient's multiple small bilateral cerebellar infarction recent CVA 11/28 status post balloon angioplasty for posterior circulation occlusion receiving inpatient rehab services.  MRI 12/27 slight interval expansion of previously seen acute right pontine infarction.  Patient remain on aspirin and Brilinta as prior to admission follow-up neurology services.  Palliative care was consulted to establish goals of care.  He did have some intermittent left hip pain tramadol added with good results.  Noted history of dementia Aricept and Namenda as advised patient was following full therapies.  Noted bouts of hypotension lisinopril discontinued as well as Norvasc blood pressures stabilizing.  Lipitor ongoing  for hyperlipidemia.  AKI on CKD stage III AKI felt to be related to hypotension lisinopril discontinued latest creatinine 1.93.  Blood sugars monitored hemoglobin A1c 6.5 patient had been on glipizide prior to admission resume as needed.  Bouts of constipation resolved with laxative assistance.   Blood pressures were monitored on TID basis and soft and monitored  Diabetes has been monitored with ac/hs CBG checks and SSI was use prn for tighter BS control.    Rehab course: During patient's stay in rehab weekly team conferences were held to monitor patient's progress, set goals and discuss barriers to discharge. At admission, patient required min mod assist for mobility  Physical exam.  Blood pressure 153/87 pulse 76 temperature 98.3 respirations 16 oxygen saturation 98% room air Constitutional.  No acute distress HEENT Head.  Normocephalic and atraumatic Eyes.  Pupils round and reactive to light no discharge without nystagmus Neck.  Supple nontender no JVD without thyromegaly Cardiac regular rate and rhythm without any extra sounds or murmur heard Abdomen.  Soft nontender positive bowel sounds without rebound Respiratory effort normal no respiratory distress without wheeze Skin.   Intact Neurologic.  Alert makes eye contact with examiner follows simple commands.  Oriented to state but not city.  He can provide a month but not appropriate to year.  Left upper extremity 3/5, 4/5 throughout otherwise  He/She  has had improvement in activity tolerance, balance, postural control as well as ability to compensate for deficits. He/She has had improvement in functional use RUE/LUE  and RLE/LLE as well as improvement in awareness.  Patient fluctuates between moderate to max assist for bed mobility min mod assist for sit to stand transfers and ambulating 100 feet minimal assist rolling walker.  He required mod max assist for navigating stairs.  With ADLs patient cooperative demonstrating overall pleasant mood.  Performs bed mobility with minimal assist for left upper left lower extremity management.  Seated edge of bed required extended sitting rest break due to feeling tired at times.  Performed room level functional mobility and toilet transfers during sessions min mod assist.  Speech therapy follow-up patient currently continues to demonstrate a flat a fact with encouragement needed at times to participate.  Overall patient 60% intelligible at the phrase and sentence level during structured language task.  All issues discussed with family need for supervision assist at discharge.  Full family teaching completed and discharged to home       Disposition: Discharge to home    Diet: Diabetic diet  Special Instructions: No driving smoking or alcohol  Norvasc as well as lisinopril remained on hold due to hypotension/AKI/CKD  Medications at discharge 1.  Tylenol as needed 2.  Aspirin 81 mg p.o. daily 3.  Lipitor 80 mg p.o. daily 4.  Aricept 10 mg p.o. nightly 5.  Glucotrol 2.5 mg p.o. daily 6.  Methyl folate 1 tablet daily 7.  Namenda 10 mg p.o. twice daily 8.  Brilinta 90 mg p.o. twice daily 9.  Tramadol 50 mg every 6 hours as needed pain 10.  Vitamin D 50,000 units every 7  days 11.Cyanocobalmin 1000 mcg daily   30-35 minutes were spent completing discharge summary and discharge planning  Discharge Instructions     Ambulatory referral to Neurology   Complete by: As directed    An appointment is requested in approximately: 4 weeks bilateral cerebellar infarction   Ambulatory referral to Physical Medicine Rehab   Complete by: As directed    Moderate complexity follow-up 1 to 2  weeks right cerebellar infarction        Follow-up Information     Raulkar, Drema Pry, MD Follow up.   Specialty: Physical Medicine and Rehabilitation Why: Office to call for appointment Contact information: [address] [place] [address] [phone]                 Signed: Mcarthur Rossetti Orvill Coulthard 09/03/2022, 5:30 AM

## 2022-09-01 NOTE — Progress Notes (Signed)
Occupational Therapy Session Note  Patient Details  Name: Mitchell Washington MRN: [id number] Date of Birth: April 16, 1951  Today's Date: 09/01/2022 OT Individual Time: 1130-1200 and 1415-1558 OT Individual Time Calculation (min): 30 min  and 43 min  Today's Date: 09/01/2022 OT Missed Time: 15 Minutes (AM) Missed Time Reason: Other (comment) (previous pt care) (AM session)   Short Term Goals: Week 1:  OT Short Term Goal 1 (Week 1): Patient will don pants with mod assist OT Short Term Goal 1 - Progress (Week 1): Progressing toward goal OT Short Term Goal 2 (Week 1): Patient will dress upper body with min assist OT Short Term Goal 2 - Progress (Week 1): Progressing toward goal OT Short Term Goal 3 (Week 1): Patient will bathe upper body self  min assist OT Short Term Goal 3 - Progress (Week 1): Met OT Short Term Goal 4 (Week 1): Pt will complete toilet transfer with Min A. OT Short Term Goal 4 - Progress (Week 1): Progressing toward goal Week 2:  OT Short Term Goal 1 (Week 2): Patient will dress upper body with min assist OT Short Term Goal 2 (Week 2): Patient will don pants with mod assist OT Short Term Goal 3 (Week 2): Pt will complete toilet transfer with Min A.   Skilled Therapeutic Interventions/Progress Updates:    Session 1: Pt greeted at time of session 15 mins late 2/2 previous pt care, pt understanding. No pain. Increased time needed throughout session for processing, sequencing, multimodal cues 2/2 reduced cognition. Supine > sit MOD A with tactile cues for initiation, sitting EOB sit > stand at RW MIN A and Min/CGA stand pivot to wheelchair with RW. Set up at sink level for oral hygiene. Oral hygiene with MIN A overall and step by step cues for task, placing toothpaste on brush, bringing to mouth, recalling task, rinsing mouth, etc. Pt set up  in chair in prep for lunch and when OT exiting pt becoming upset and demanding to go back to bed saying "you dont want me to fall in this floor do  you if I stay up" and wanting to go to bed. Stand pivot back to bed CGA. Alarm on call bell in reach.  Session 2: Pt bed level at time of session bed level  with famiy present who remained throughout session. No pain reported. Supine > sit MIN A and stand pivot to wheelchair MIN/CGA with RW. Transported to/from gym for time management. In gym, focused on BUE activities to promote functional movement latterns for LUE with medium size ball toss, mirror for feedback for various AAROM for LUE, BUE movements with cues to promote symmetry, large therapy ball roll outs and weight bearing for LUE to promote NMR, standing reaching and grasping activities with LUE and MIN A for standing balance throughout at high/low table. Pt back in room stand pivot with CGA back to bed, left with NT.  Therapy Documentation Precautions:  Precautions Precautions: Fall Precaution Comments: L hemiparesis, dementia Restrictions Weight Bearing Restrictions: No     Therapy/Group: Individual Therapy  Viona Gilmore 09/01/2022, 7:31 AM

## 2022-09-01 NOTE — Progress Notes (Signed)
PROGRESS NOTE   Subjective/Complaints:  Pt reports he wants ot leave- doesn't want to stay in rehab Denies pain Denies constipation, however per nursing, LBM 4 days ago Says "he feels like an old man".    ROS: limited by sleepiness this AM  Objective:   No results found. No results for input(s): "WBC", "HGB", "HCT", "PLT" in the last 72 hours.  No results for input(s): "NA", "K", "CL", "CO2", "GLUCOSE", "BUN", "CREATININE", "CALCIUM" in the last 72 hours.   Intake/Output Summary (Last 24 hours) at 09/01/2022 1111 Last data filed at 08/31/2022 1327 Gross per 24 hour  Intake 120 ml  Output --  Net 120 ml        Physical Exam: Vital Signs Blood pressure 137/81, pulse 86, temperature 97.8 F (36.6 C), temperature source Oral, resp. rate 17, height 5\' 9"  (1.753 m), weight 69.7 kg, SpO2 98 %.    General: awake, but sleepy- kept trying to fall asleep;  NAD HENT: conjugate gaze; oropharynx moist CV: regular rate; no JVD Pulmonary: CTA B/L; no W/R/R- good air movement GI: soft, NT, mildly distended; hypoactive BS Psychiatric: appropriate- but sleepy-  Neurological: dysarthric, some word substitutions Neuro: Patient is alert makes eye contact with examiner.  Follows simple commands.  Oriented to state but not city.  He can provide the month but not appropriate year. LUE 3/5 strength, 4/5 throughout otherwise  Dons shoes with Total A MSK: left hip TTP GU: incontinent of urine  Assessment/Plan: 1. Functional deficits which require 3+ hours per day of interdisciplinary therapy in a comprehensive inpatient rehab setting. Physiatrist is providing close team supervision and 24 hour management of active medical problems listed below. Physiatrist and rehab team continue to assess barriers to discharge/monitor patient progress toward functional and medical goals  Care Tool:  Bathing    Body parts bathed by patient: Face,  Chest, Abdomen, Left upper leg, Right upper leg, Left arm   Body parts bathed by helper: Right arm, Right lower leg, Left lower leg, Buttocks     Bathing assist Assist Level: Moderate Assistance - Patient 50 - 74%     Upper Body Dressing/Undressing Upper body dressing   What is the patient wearing?: Pull over shirt    Upper body assist Assist Level: Moderate Assistance - Patient 50 - 74%    Lower Body Dressing/Undressing Lower body dressing      What is the patient wearing?: Incontinence brief, Pants     Lower body assist Assist for lower body dressing: Maximal Assistance - Patient 25 - 49%     Toileting Toileting    Toileting assist Assist for toileting: Maximal Assistance - Patient 25 - 49%     Transfers Chair/bed transfer  Transfers assist     Chair/bed transfer assist level: Moderate Assistance - Patient 50 - 74% Chair/bed transfer assistive device: Museum/gallery exhibitions officer assist      Assist level: Minimal Assistance - Patient > 75% Assistive device: Parallel bars Max distance: 76ft   Walk 10 feet activity   Assist  Walk 10 feet activity did not occur: Safety/medical concerns        Walk 50 feet activity  Assist Walk 50 feet with 2 turns activity did not occur: Safety/medical concerns         Walk 150 feet activity   Assist Walk 150 feet activity did not occur: Safety/medical concerns         Walk 10 feet on uneven surface  activity   Assist Walk 10 feet on uneven surfaces activity did not occur: Safety/medical concerns         Wheelchair     Assist Is the patient using a wheelchair?: Yes Type of Wheelchair: Manual    Wheelchair assist level: Dependent - Patient 0%      Wheelchair 50 feet with 2 turns activity    Assist        Assist Level: Dependent - Patient 0%   Wheelchair 150 feet activity     Assist      Assist Level: Dependent - Patient 0%   Blood pressure 137/81,  pulse 86, temperature 97.8 F (36.6 C), temperature source Oral, resp. rate 17, height 5\' 9"  (1.753 m), weight 69.7 kg, SpO2 98 %.    Medical Problem List and Plan: 1. 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  MRI 12/27 slight interval expansion of previously an acute right pontine infarction             -patient may shower             -ELOS/Goals: 10-14 days            Continue CIR  Homocysteine reviewed from 12/7 and is 12.2  Con't CIR- PT, OT and SLP 2.  Impaired mobility: continue Lovenox             -antiplatelet therapy: Aspirin 81 mg daily and Brilinta 90 mg twice daily 3. Left hip pain: tramadol added prn. Tylenol as needed. Aquatheramia ordered 4. Dementia: continue Aricept 10 mg nightly, Namenda 10 mg twice daily             -antipsychotic agents: N/A 5. Neuropsych/cognition: This patient is not capable of making decisions on his own behalf. 6. Skin/Wound Care: Routine skin checks 7. Fluids/Electrolytes/Nutrition: Routine in and outs with follow-up chemistries 8.  Hypertension.  d/c Lisinopril since resting BP is below goal and patient had symptomatic hypotension Monitor with increased mobility. DBP is below goal given stenosis, d/c norvasc. BP reviewed and well controlled  1/14- BP running 130s-140s- systolic- con't regimen 9.  Hyperlipidemia.  Lipitor, LDL reviewed and is 28.  10.  AKI on CKD stage III.  AKI felt to be related to hypotension.  Follow-up chemistries.  Baseline creatinine 1.38-1.86. Cr reviewed and is 1.58. d/c lisinopril  1/14- no labs ordered- will order for AM 11.  Diabetes mellitus.  Hemoglobin A1c 6.5.  Resume glipizide 2.5 mg daily. Magnesium reviewed and is normal. 1/14- CBGs running 90s-120s- well controlled- con't regimen  12. Vitamin D deficiency: start  ergocalciferol 50,000U once per week 13. Fatigue: B12 reviewed and elevated. Metanx started. Appears to have worsened since starting mirtazepine, will d/c 14. Decreased appetite: Mirtazepine started at night but appears to have worsened fatigued to have d/ced 15. Flat affect: Mirtazepine ordered HS, but this appears to have worsened fatigue, so d/ced 16. Constipation  1/14- LBM 4 days ago- will give Sorbitol and add Senokot-S 1 tab BID    LOS: 10 days A FACE TO FACE EVALUATION WAS PERFORMED  Karlon Schlafer 09/01/2022, 11:11 AM

## 2022-09-02 ENCOUNTER — Other Ambulatory Visit (HOSPITAL_COMMUNITY): Payer: Self-pay

## 2022-09-02 LAB — CBC WITH DIFFERENTIAL/PLATELET
Abs Immature Granulocytes: 0.02 10*3/uL (ref 0.00–0.07)
Basophils Absolute: 0.1 10*3/uL (ref 0.0–0.1)
Basophils Relative: 1 %
Eosinophils Absolute: 0.1 10*3/uL (ref 0.0–0.5)
Eosinophils Relative: 1 %
HCT: 38.8 % — ABNORMAL LOW (ref 39.0–52.0)
Hemoglobin: 12.4 g/dL — ABNORMAL LOW (ref 13.0–17.0)
Immature Granulocytes: 0 %
Lymphocytes Relative: 24 %
Lymphs Abs: 1.7 10*3/uL (ref 0.7–4.0)
MCH: 26.8 pg (ref 26.0–34.0)
MCHC: 32 g/dL (ref 30.0–36.0)
MCV: 83.8 fL (ref 80.0–100.0)
Monocytes Absolute: 0.7 10*3/uL (ref 0.1–1.0)
Monocytes Relative: 9 %
Neutro Abs: 4.8 10*3/uL (ref 1.7–7.7)
Neutrophils Relative %: 65 %
Platelets: 516 10*3/uL — ABNORMAL HIGH (ref 150–400)
RBC: 4.63 MIL/uL (ref 4.22–5.81)
RDW: 14.1 % (ref 11.5–15.5)
WBC: 7.3 10*3/uL (ref 4.0–10.5)
nRBC: 0 % (ref 0.0–0.2)

## 2022-09-02 LAB — GLUCOSE, CAPILLARY
Glucose-Capillary: 112 mg/dL — ABNORMAL HIGH (ref 70–99)
Glucose-Capillary: 92 mg/dL (ref 70–99)
Glucose-Capillary: 99 mg/dL (ref 70–99)
Glucose-Capillary: 71 mg/dL (ref 70–99)
Glucose-Capillary: 80 mg/dL (ref 70–99)

## 2022-09-02 LAB — BASIC METABOLIC PANEL
Anion gap: 11 (ref 5–15)
BUN: 44 mg/dL — ABNORMAL HIGH (ref 8–23)
CO2: 22 mmol/L (ref 22–32)
Calcium: 9.5 mg/dL (ref 8.9–10.3)
Chloride: 107 mmol/L (ref 98–111)
Creatinine, Ser: 1.93 mg/dL — ABNORMAL HIGH (ref 0.61–1.24)
GFR, Estimated: 37 mL/min — ABNORMAL LOW (ref >60)
Glucose, Bld: 106 mg/dL — ABNORMAL HIGH (ref 70–99)
Potassium: 5 mmol/L (ref 3.5–5.1)
Sodium: 140 mmol/L (ref 135–145)

## 2022-09-02 MED ORDER — TRAMADOL HCL 50 MG PO TABS
50.0000 mg | ORAL_TABLET | Freq: Four times a day (QID) | ORAL | 0 refills | Status: DC | PRN
  Filled 2022-09-02: qty 30, 7d supply, fill #0

## 2022-09-02 MED ORDER — GLIPIZIDE ER 2.5 MG PO TB24
2.5000 mg | ORAL_TABLET | Freq: Every day | ORAL | 0 refills | Status: DC
  Filled 2022-09-02: qty 30, 30d supply, fill #0

## 2022-09-02 MED ORDER — SODIUM CHLORIDE 0.45 % IV SOLN: INTRAVENOUS | Status: DC

## 2022-09-02 MED ORDER — VITAMIN B-12 1000 MCG PO TABS
1000.0000 ug | ORAL_TABLET | Freq: Every day | ORAL | 0 refills | Status: DC
  Filled 2022-09-02: qty 30, 30d supply, fill #0

## 2022-09-02 MED ORDER — VITAMIN D (ERGOCALCIFEROL) 1.25 MG (50000 UNIT) PO CAPS
50000.0000 [IU] | ORAL_CAPSULE | ORAL | 0 refills | Status: DC
  Filled 2022-09-02: qty 5, 35d supply, fill #0

## 2022-09-02 MED ORDER — ATORVASTATIN CALCIUM 80 MG PO TABS
80.0000 mg | ORAL_TABLET | Freq: Every day | ORAL | 0 refills | Status: DC
  Filled 2022-09-02: qty 30, 30d supply, fill #0

## 2022-09-02 MED ORDER — L-METHYLFOLATE-B6-B12 3-35-2 MG PO TABS
1.0000 | ORAL_TABLET | Freq: Every day | ORAL | 0 refills | Status: DC
  Filled 2022-09-02: qty 30, 30d supply, fill #0

## 2022-09-02 MED ORDER — TICAGRELOR 90 MG PO TABS
90.0000 mg | ORAL_TABLET | Freq: Two times a day (BID) | ORAL | 0 refills | Status: DC
  Filled 2022-09-02: qty 60, 30d supply, fill #0

## 2022-09-02 NOTE — Progress Notes (Signed)
Physical Therapy Discharge Summary  Patient Details  Name: Mitchell Washington MRN: [id number] Date of Birth: [birth year]  Date of Discharge from PT service:September 02, 2022  Patient has met 5 of 8 long term goals due to improved balance, increased strength, functional use of  left lower extremity, improved attention, and improved awareness.  Patient to discharge at an ambulatory level [place].   Patient's care partner is independent to provide the necessary physical and cognitive assistance at discharge.  Reasons goals not met: Pt required modA for dynamic standing balance with goals set to minA. He also needed modA for safely going up/down stairs and for bed mobility without hospital bed features. Recommended hospital bed to family but they report no room available in their home to accommodate.   Recommendation:  Patient will benefit from ongoing skilled PT services in home health setting to continue to advance safe functional mobility, address ongoing impairments in bed mobility, functional transfers, caregiver training, home safety, and minimize fall risk.  Equipment: No equipment provided - pt owns w/c, RW from prior admissions.   Reasons for discharge: lack of progress toward goals and discharge from hospital  Patient/family agrees with progress made and goals achieved: Yes  PT Discharge Precautions/Restrictions Restrictions Weight Bearing Restrictions: No Pain Pain Assessment Pain Scale: 0-10 Pain Score: 0-No pain Pain Interference Pain Interference Pain Effect on Sleep: 1. Rarely or not at all Pain Interference with Therapy Activities: 1. Rarely or not at all Pain Interference with Day-to-Day Activities: 1. Rarely or not at all Vision/Perception  Vision - History Ability to See in Adequate Light: 0 Adequate Vision - Assessment Eye Alignment: Within Functional Limits Alignment/Gaze Preference: Within Defined Limits Perception Perception: Impaired Inattention/Neglect:  Does not attend to left side of body Praxis Praxis: Impaired Praxis Impairment Details: Motor planning;Initiation  Cognition Overall Cognitive Status: History of cognitive impairments - at baseline Arousal/Alertness: Awake/alert Orientation Level: Oriented to person;Oriented to place;Disoriented to situation;Disoriented to time Attention: Sustained;Focused Focused Attention: Appears intact Sustained Attention: Impaired Sustained Attention Impairment: Verbal basic;Functional basic Selective Attention: Impaired Selective Attention Impairment: Verbal basic;Functional basic Memory: Impaired Memory Impairment: Decreased short term memory;Decreased recall of new information;Retrieval deficit;Storage deficit;Decreased long term memory Decreased Long Term Memory: Verbal basic;Functional basic Decreased Short Term Memory: Functional basic;Verbal basic Awareness: Impaired Awareness Impairment: Intellectual impairment Problem Solving: Impaired Problem Solving Impairment: Functional basic;Verbal basic Behaviors: Poor frustration tolerance;Other (comment) (learned helplesness) Safety/Judgment: Impaired Comments: Decreased insight and awareness Sensation Sensation Light Touch: Appears Intact Hot/Cold: Appears Intact Proprioception: Impaired by gross assessment Stereognosis: Not tested Additional Comments: Difficult to assess due to apathy and cognitive impairments Coordination Gross Motor Movements are Fluid and Coordinated: No Fine Motor Movements are Fluid and Coordinated: No Coordination and Movement Description: left hemi, generalized weakness/deconditioning Heel Shin Test: unable on L due to functional weakness Motor  Motor Motor: Hemiplegia;Motor apraxia;Abnormal postural alignment and control;Abnormal tone;Motor impersistence Motor - Discharge Observations: L hemi (UE > LE)  Mobility Bed Mobility Bed Mobility: Rolling Right;Rolling Left;Supine to Sit;Sit to Supine Rolling Right:  Minimal Assistance - Patient > 75% Rolling Left: Supervision/Verbal cueing Supine to Sit: Moderate Assistance - Patient 50-74% Sit to Supine: Moderate Assistance - Patient 50-74% Transfers Transfers: Sit to Stand;Stand to Sit;Stand Pivot Transfers Sit to Stand: Minimal Assistance - Patient > 75% Stand to Sit: Minimal Assistance - Patient > 75% Stand Pivot Transfers: Minimal Assistance - Patient > 75% Stand Pivot Transfer Details: Tactile cues for initiation;Tactile cues for sequencing;Tactile cues for weight shifting;Tactile cues for posture;Verbal cues  for technique;Verbal cues for sequencing;Visual cues/gestures for sequencing;Verbal cues for gait pattern;Verbal cues for precautions/safety;Manual facilitation for weight shifting;Manual facilitation for placement Transfer (Assistive device): Rolling walker Locomotion  Gait Ambulation: Yes Gait Assistance: Minimal Assistance - Patient > 75% Gait Distance (Feet): 200 Feet Assistive device: Rolling walker Gait Assistance Details: Tactile cues for weight shifting;Tactile cues for sequencing;Tactile cues for initiation;Tactile cues for posture;Tactile cues for placement;Verbal cues for precautions/safety;Verbal cues for technique;Verbal cues for safe use of DME/AE;Verbal cues for gait pattern;Verbal cues for sequencing;Manual facilitation for weight shifting Gait Gait: Yes Gait Pattern: Impaired Gait Pattern: Decreased stance time - left;Decreased stride length;Decreased hip/knee flexion - left;Decreased dorsiflexion - left;Decreased weight shift to left;Left flexed knee in stance;Step-to pattern;Narrow base of support;Poor foot clearance - left;Trunk flexed;Left genu recurvatum Gait velocity: decreased Stairs / Additional Locomotion Stairs: Yes Stairs Assistance: Moderate Assistance - Patient 50 - 74% Stair Management Technique: Two rails Number of Stairs: 4 Height of Stairs: 6 Pick up small object from the floor assist level: Maximal  Assistance - Patient 25 - 49% Wheelchair Mobility Wheelchair Mobility: Yes Wheelchair Assistance: Dependent - Patient 0% Wheelchair Parts Management: Needs assistance Distance: 35ft  Trunk/Postural Assessment  Cervical Assessment Cervical Assessment: Exceptions to [facility] (forward head. Limited rotation bilaterally) Thoracic Assessment Thoracic Assessment: Exceptions to [facility] (rounded shoulders, kyphotic) Lumbar Assessment Lumbar Assessment: Exceptions to [facility] (posterior tilt) Postural Control Postural Control: Deficits on evaluation Trunk Control: posterior bias, flexed trunk Righting Reactions: delayed and insufficient  Balance Balance Balance Assessed: Yes Berg Balance Test Sit to Stand: Needs minimal aid to stand or to stabilize Standing Unsupported: Unable to stand 30 seconds unassisted Sitting with Back Unsupported but Feet Supported on Floor or Stool: Able to sit 2 minutes under supervision Stand to Sit: Sits independently, has uncontrolled descent Transfers: Needs one person to assist Standing Unsupported with Eyes Closed: Needs help to keep from falling Standing Ubsupported with Feet Together: Needs help to attain position and unable to hold for 15 seconds From Standing, Reach Forward with Outstretched Arm: Loses balance while trying/requires external support From Standing Position, Pick up Object from Floor: Unable to try/needs assist to keep balance From Standing Position, Turn to Look Behind Over each Shoulder: Needs assist to keep from losing balance and falling Turn 360 Degrees: Needs assistance while turning Standing Unsupported, Alternately Place Feet on Step/Stool: Needs assistance to keep from falling or unable to try Standing Unsupported, One Foot in Front: Loses balance while stepping or standing Standing on One Leg: Unable to try or needs assist to prevent fall Total Score: 6 Extremity Assessment      RLE Assessment RLE Assessment: Exceptions to [facility] General  Strength Comments: Grossly 4/5 LLE Assessment LLE Assessment: Exceptions to [facility] LLE Strength LLE Overall Strength: Deficits Left Hip Flexion: 2-/5 Left Hip Extension: 2-/5 Left Hip ABduction: 2/5 Left Hip ADduction: 2/5 Left Knee Flexion: 2/5 Left Knee Extension: 2+/5 Left Ankle Dorsiflexion: 3/5 Left Ankle Plantar Flexion: 3-/5   Mohid Furuya P Mynor Witkop PT, DPT 09/02/2022, 12:23 PM

## 2022-09-02 NOTE — Progress Notes (Addendum)
Patient ID: Mitchell Washington, male   DOB: 15-Feb-1951, 72 y.o.   MRN: [id number]  Spoke with [name] to discuss not being here for therapies.She reports they were here on Sunday and saw OT session have not seen PT or SP. Discussed not being able to do stairs and get into the home will need non-emergency ambulance for home. She reports they are trying to get a person to do the ramp but it is expensive. She feels and agrees with ambulance home and wants set up for 10:00. Wants PA to call her to go over the discharge instructions tomorrow. Have messaged PA's regarding this.  2:58 PM Asked wife again regarding hospital bed and she declined due to no room for one. Aware team is recommending one for pt at home.

## 2022-09-02 NOTE — Plan of Care (Signed)
Remains incontinent with toileting protocol activation

## 2022-09-02 NOTE — Progress Notes (Signed)
Occupational Therapy Discharge Summary  Patient Details  Name: Mitchell Washington MRN: [id number] Date of Birth: [birth year]  Date of Discharge from OT service:September 02, 2022  Today's Date: 09/02/2022 OT Individual Time: 5621-3086 OT Individual Time Calculation (min): 58 min    Patient has met 9 of 13 long term goals due to improved activity tolerance, improved balance, postural control, and improved coordination.  Patient to discharge at [facility] Assist level.  Patient's care partner unavailable to provide the necessary physical assistance at discharge.    Reasons goals not met: Patient will need assistance for LB dressing if continues to wear compression hose for BP management.    Recommendation:  Patient will benefit from ongoing skilled OT services in home health setting to continue to advance functional skills in the area of BADL and Reduce care partner burden.  Equipment: No equipment provided  Reasons for discharge: discharge from hospital  Patient/family agrees with progress made and goals achieved: Yes Skilled Therapeutic Intervention:   Patient received supine in bed.  Agreeable to get out of bed.  Declined need for toilet.  Patient able to bathe and dress himself with min assist excluding ted hose while seated at sink.  Able to statically stand x 1 min to allow brief application.  Attempting to use LUE throughout ADL with prompting.   Patient's wife not present for this session, but reportedly here this weekend and comfortable with level of care.  Patient returned to be at end of session with call bell in reach and bed alarm set.  OT Discharge Precautions/Restrictions  Precautions Precautions: Fall Precaution Comments: L hemiparesis, dementia, apathy towards therapy Restrictions Weight Bearing Restrictions: No Pain Pain Assessment Pain Scale: 0-10 Pain Score: 0-No pain ADL ADL Eating: Minimal assistance Where Assessed-Eating: Wheelchair Grooming:  Supervision/safety Where Assessed-Grooming: Sitting at sink, Wheelchair Upper Body Bathing: Minimal assistance Where Assessed-Upper Body Bathing: Sitting at sink Lower Body Bathing: Minimal assistance Where Assessed-Lower Body Bathing: Sitting at sink, Standing at sink Upper Body Dressing: Minimal assistance Where Assessed-Upper Body Dressing: Sitting at sink, Wheelchair Lower Body Dressing: Maximal assistance Where Assessed-Lower Body Dressing: Sitting at sink, Wheelchair, Standing at sink Toileting: Minimal assistance Where Assessed-Toileting: Teacher, adult education: Curator Method: Surveyor, minerals: Engineer, technical sales: International aid/development worker Method: Engineer, technical sales: Insurance underwriter: Insurance underwriter Method: Warden/ranger: Emergency planning/management officer ADL Comments: need to reassess patient's equipment needs when wife is present Vision Baseline Vision/History: 1 Wears glasses Patient Visual Report: No change from baseline Vision Assessment?: Vision impaired- to be further tested in functional context Eye Alignment: Within Functional Limits Ocular Range of Motion: Restricted on the left Alignment/Gaze Preference: Within Defined Limits Tracking/Visual Pursuits: Impaired - to be further tested in functional context;Requires cues, head turns, or add eye shifts to track;Decreased smoothness of horizontal tracking Convergence: Within functional limits Visual Fields: Left visual field deficit Perception  Perception: Impaired Inattention/Neglect: Does not attend to left side of body Praxis Praxis: Impaired Praxis Impairment Details: Motor planning;Initiation Cognition Cognition Overall Cognitive Status: History of cognitive impairments - at baseline (Simultaneous filing. User may not have seen previous data.) Arousal/Alertness:  Awake/alert (Simultaneous filing. User may not have seen previous data.) Orientation Level: Person;Place;Situation Person: Oriented Place: Oriented Situation: Oriented Memory: Impaired (Simultaneous filing. User may not have seen previous data.) Memory Impairment: Decreased short term memory;Decreased recall of new information;Retrieval deficit;Storage deficit;Decreased long term memory (Simultaneous filing. User  may not have seen previous data.) Decreased Long Term Memory: Verbal basic;Functional basic Decreased Short Term Memory: Functional basic;Verbal basic Attention: Sustained;Focused (Simultaneous filing. User may not have seen previous data.) Focused Attention: Appears intact (Simultaneous filing. User may not have seen previous data.) Sustained Attention: Impaired (Simultaneous filing. User may not have seen previous data.) Sustained Attention Impairment: Verbal basic;Functional basic (Simultaneous filing. User may not have seen previous data.) Selective Attention: Impaired (Simultaneous filing. User may not have seen previous data.) Selective Attention Impairment: Verbal basic;Functional basic (Simultaneous filing. User may not have seen previous data.) Awareness: Impaired (Simultaneous filing. User may not have seen previous data.) Awareness Impairment: Intellectual impairment (Simultaneous filing. User may not have seen previous data.) Problem Solving: Impaired Problem Solving Impairment: Functional basic Behaviors: Poor frustration tolerance;Other (comment) Safety/Judgment: Impaired (Simultaneous filing. User may not have seen previous data.) Comments: Decreased insight and awareness (Simultaneous filing. User may not have seen previous data.) Brief Interview for Mental Status (BIMS) Repetition of Three Words (First Attempt): 3 Temporal Orientation: Year: Missed by more than 5 years Temporal Orientation: Month: Accurate within 5 days Temporal Orientation: Day: Incorrect Recall:  "Sock": No, could not recall Recall: "Blue": No, could not recall Recall: "Bed": No, could not recall BIMS Summary Score: 5 Sensation Sensation Light Touch: Impaired by gross assessment Hot/Cold: Appears Intact Proprioception: Impaired by gross assessment Stereognosis: Not tested Additional Comments: decreased awareness of LUE Coordination Gross Motor Movements are Fluid and Coordinated: No Fine Motor Movements are Fluid and Coordinated: No Coordination and Movement Description: left hemi, generalized weakness/deconditioning Heel Shin Test: unable on L due to functional weakness 9 Hole Peg Test: NT Motor  Motor Motor: Hemiplegia;Motor apraxia;Abnormal postural alignment and control;Abnormal tone;Motor impersistence Motor - Discharge Observations: L hemi (UE > LE) Mobility  Bed Mobility Bed Mobility: Rolling Right;Rolling Left;Supine to Sit;Sit to Supine Rolling Right: Minimal Assistance - Patient > 75% Rolling Left: Supervision/Verbal cueing Supine to Sit: Moderate Assistance - Patient 50-74% Sit to Supine: Minimal Assistance - Patient > 75% Transfers Sit to Stand: Minimal Assistance - Patient > 75% Stand to Sit: Minimal Assistance - Patient > 75%  Trunk/Postural Assessment  Cervical Assessment Cervical Assessment: Exceptions to [facility] Thoracic Assessment Thoracic Assessment: Exceptions to [facility] Lumbar Assessment Lumbar Assessment: Exceptions to [facility] Postural Control Postural Control: Deficits on evaluation Trunk Control: posterior bias, flexed trunk Righting Reactions: delayed and insufficient  Balance Balance Balance Assessed: Yes Berg Balance Test Sit to Stand: Needs minimal aid to stand or to stabilize Standing Unsupported: Unable to stand 30 seconds unassisted Sitting with Back Unsupported but Feet Supported on Floor or Stool: Able to sit 2 minutes under supervision Stand to Sit: Sits independently, has uncontrolled descent Transfers: Needs one person to  assist Standing Unsupported with Eyes Closed: Needs help to keep from falling Standing Ubsupported with Feet Together: Needs help to attain position and unable to hold for 15 seconds From Standing, Reach Forward with Outstretched Arm: Loses balance while trying/requires external support From Standing Position, Pick up Object from Floor: Unable to try/needs assist to keep balance From Standing Position, Turn to Look Behind Over each Shoulder: Needs assist to keep from losing balance and falling Turn 360 Degrees: Needs assistance while turning Standing Unsupported, Alternately Place Feet on Step/Stool: Needs assistance to keep from falling or unable to try Standing Unsupported, One Foot in Front: Loses balance while stepping or standing Standing on One Leg: Unable to try or needs assist to prevent fall Total Score: 6 Static Sitting Balance Static Sitting -  Balance Support: No upper extremity supported Static Sitting - Level of Assistance: 7: Independent Dynamic Sitting Balance Dynamic Sitting - Balance Support: During functional activity;No upper extremity supported Dynamic Sitting - Level of Assistance: 5: Stand by assistance Static Standing Balance Static Standing - Balance Support: Right upper extremity supported;Left upper extremity supported;During functional activity Static Standing - Level of Assistance: 5: Stand by assistance Dynamic Standing Balance Dynamic Standing - Balance Support: During functional activity;Right upper extremity supported;Left upper extremity supported Dynamic Standing - Level of Assistance: 4: Min assist Dynamic Standing - Balance Activities: Reaching for objects;Lateral lean/weight shifting;Forward lean/weight shifting Extremity/Trunk Assessment RUE Assessment RUE Assessment: Within Functional Limits LUE Assessment LUE Assessment: Exceptions to [facility] Passive Range of Motion (PROM) Comments: Functional P/ROM shoulder, elbow, wrist, hand in all ranges. Active  Range of Motion (AROM) Comments: shoulder flexion 30*, elbow flex/ext - 75%, mass grasp/release 60% General Strength Comments: NT LUE Body System: Neuro Brunstrum levels for arm and hand: Arm;Hand Brunstrum level for arm: Stage III Synergy is performed voluntarily Brunstrum level for hand: Stage IV Movements deviating from synergies   Mariah Milling 09/02/2022, 12:36 PM

## 2022-09-02 NOTE — Progress Notes (Signed)
Inpatient Rehabilitation Discharge Medication Review by a Pharmacist  A complete drug regimen review was completed for this patient to identify any potential clinically significant medication issues.  High Risk Drug Classes Is patient taking? Indication by Medication  Antipsychotic No   Anticoagulant No   Antibiotic No   Opioid Yes Tramadol - moderate pain  Antiplatelet Yes Aspirin 81 mg, Ticagrelor - CVA prophylaxis  Hypoglycemics/insulin Yes Glucotrol XL - glucose control  Vasoactive Medication No   Chemotherapy No   Other Yes Atorvastatin - hyperlipidemia Donepezil, Memantine - dementia Methylfolate-B6-B12 combination, Vitamin D, B-12 - supplements PRN Acetaminophen - mild pain     Type of Medication Issue Identified Description of Issue Recommendation(s)  Drug Interaction(s) (clinically significant)     Duplicate Therapy     Allergy     No Medication Administration End Date     Incorrect Dose     Additional Drug Therapy Needed     Significant med changes from prior encounter (inform family/care partners about these prior to discharge). Amlodipine and Lisinopril stopped during this admission due to low blood pressures.   Other       Clinically significant medication issues were identified that warrant physician communication and completion of prescribed/recommended actions by midnight of the next day:  No  Time spent performing this drug regimen review (minutes):  20   Arty Baumgartner, [place] 09/02/2022 5:37 PM

## 2022-09-02 NOTE — Progress Notes (Signed)
PROGRESS NOTE   Subjective/Complaints:  No issues this am, no pain c/o, difficulty following simple commands    ROS: limited by mental status   Objective:   No results found. Recent Labs    09/02/22 0646  WBC 7.3  HGB 12.4*  HCT 38.8*  PLT 516*    Recent Labs    09/02/22 0646  NA 140  K 5.0  CL 107  CO2 22  GLUCOSE 106*  BUN 44*  CREATININE 1.93*  CALCIUM 9.5     Intake/Output Summary (Last 24 hours) at 09/02/2022 0815 Last data filed at 09/01/2022 1900 Gross per 24 hour  Intake 0 ml  Output --  Net 0 ml         Physical Exam: Vital Signs Blood pressure 125/65, pulse 74, temperature 97.9 F (36.6 C), resp. rate 18, height 5\' 9"  (1.753 m), weight 69.7 kg, SpO2 100 %.   General: No acute distress Mood and affect are appropriate Heart: Regular rate and rhythm no rubs murmurs or extra sounds Lungs: Clear to auscultation, breathing unlabored, no rales or wheezes Abdomen: Positive bowel sounds, soft nontender to palpation, nondistended Extremities: No clubbing, cyanosis, or edema  Neuro: Patient is alert makes eye contact with examiner.  Follows simple commands.  Oriented to state but not city.  He can provide the month but not appropriate year. LUE and LLE 3/5 strength, 4/5 throughout otherwise     Assessment/Plan: 1. Functional deficits which require 3+ hours per day of interdisciplinary therapy in a comprehensive inpatient rehab setting. Physiatrist is providing close team supervision and 24 hour management of active medical problems listed below. Physiatrist and rehab team continue to assess barriers to discharge/monitor patient progress toward functional and medical goals  Care Tool:  Bathing    Body parts bathed by patient: Face, Chest, Abdomen, Left upper leg, Right upper leg, Left arm   Body parts bathed by helper: Right arm, Right lower leg, Left lower leg, Buttocks     Bathing  assist Assist Level: Moderate Assistance - Patient 50 - 74%     Upper Body Dressing/Undressing Upper body dressing   What is the patient wearing?: Pull over shirt    Upper body assist Assist Level: Moderate Assistance - Patient 50 - 74%    Lower Body Dressing/Undressing Lower body dressing      What is the patient wearing?: Incontinence brief, Pants     Lower body assist Assist for lower body dressing: Maximal Assistance - Patient 25 - 49%     Toileting Toileting    Toileting assist Assist for toileting: Maximal Assistance - Patient 25 - 49%     Transfers Chair/bed transfer  Transfers assist     Chair/bed transfer assist level: Moderate Assistance - Patient 50 - 74% Chair/bed transfer assistive device: Museum/gallery exhibitions officer assist      Assist level: Minimal Assistance - Patient > 75% Assistive device: Parallel bars Max distance: 83ft   Walk 10 feet activity   Assist  Walk 10 feet activity did not occur: Safety/medical concerns        Walk 50 feet activity   Assist Walk 50 feet with  2 turns activity did not occur: Safety/medical concerns         Walk 150 feet activity   Assist Walk 150 feet activity did not occur: Safety/medical concerns         Walk 10 feet on uneven surface  activity   Assist Walk 10 feet on uneven surfaces activity did not occur: Safety/medical concerns         Wheelchair     Assist Is the patient using a wheelchair?: Yes Type of Wheelchair: Manual    Wheelchair assist level: Dependent - Patient 0%      Wheelchair 50 feet with 2 turns activity    Assist        Assist Level: Dependent - Patient 0%   Wheelchair 150 feet activity     Assist      Assist Level: Dependent - Patient 0%   Blood pressure 125/65, pulse 74, temperature 97.9 F (36.6 C), resp. rate 18, height 5\' 9"  (1.753 m), weight 69.7 kg, SpO2 100 %.    Medical Problem List and Plan: 1. 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  MRI 12/27 slight interval expansion of previously an acute right pontine infarction             -patient may shower             -ELOS/Goals: 09/03/22            Continue CIR    Con't CIR- PT, OT and SLP 2.  Impaired mobility: continue Lovenox             -antiplatelet therapy: Aspirin 81 mg daily and Brilinta 90 mg twice daily 3. Left hip pain: tramadol added prn. Tylenol as needed. Aquatheramia ordered 4. Dementia: continue Aricept 10 mg nightly, Namenda 10 mg twice daily             -antipsychotic agents: N/A 5. Neuropsych/cognition: This patient is not capable of making decisions on his own behalf. 6. Skin/Wound Care: Routine skin checks 7. Fluids/Electrolytes/Nutrition: Routine in and outs with follow-up chemistries 8.  Hypertension.  d/c Lisinopril since resting BP is below goal and patient had symptomatic hypotension Monitor with increased mobility. DBP is below goal given stenosis, d/c norvasc. BP reviewed and well controlled  1/14- BP running [id number]- systolic- con't regimen 9.  Hyperlipidemia.  Lipitor, LDL reviewed and is 28.  10.  AKI on CKD stage III.  AKI felt to be related to hypotension.  Follow-up chemistries.  Baseline creatinine 1.38-1.86. Cr reviewed and is 1.58. d/c lisinopril  1/15- Creat near baseline BUN is elevated , IVF overnite  11.  Diabetes mellitus.  Hemoglobin A1c 6.5.  Resume glipizide 2.5 mg daily. Magnesium reviewed and is normal. 1/14- CBGs running 90s-120s- well controlled- con't regimen  12. Vitamin D deficiency: start ergocalciferol 50,000U once per week 13. Fatigue: B12 reviewed and elevated. Metanx started. Appears to have worsened since starting mirtazepine, will d/c 14. Decreased appetite: Mirtazepine started at night but appears  to have worsened fatigued to have d/ced 15. Flat affect: Mirtazepine ordered HS, but this appears to have worsened fatigue, so d/ced 16. Constipation  1/14- LBM 4 days ago- will give Sorbitol and add Senokot-S 1 tab BID    LOS: 11 days A FACE TO FACE EVALUATION WAS PERFORMED  Charlett Blake 09/02/2022, 8:15 AM

## 2022-09-02 NOTE — Progress Notes (Signed)
Inpatient Rehabilitation Care Coordinator Discharge Note   Patient Details  Name: Mitchell Washington MRN: [id number] Date of Birth: [birth year]   Discharge location: HOME WITH WIFE WHO WAS PROVIDING 24/7 CARE PRIOR TO ADMISSION  Length of Stay: 12 DAYS  Discharge activity level: MIN LEVEL OF ASSIST  Home/community participation: [facility] SINCE LAST ADMISSION ONE MONTH AGO  Patient response JS:RPRXYV Literacy - How often do you need to have someone help you when you read instructions, pamphlets, or other written material from your doctor or pharmacy?: Patient unable to respond  Patient response OP:FYTWKM Isolation - How often do you feel lonely or isolated from those around you?: Patient unable to respond  Services provided included: MD, RD, PT, OT, SLP, RN, CM, Pharmacy, SW  Financial Services:  Charity fundraiser Utilized: Environmental education officer MEDICARE  Choices offered to/list presented to: WIFE  Follow-up services arranged:  Home Health, Patient/Family request agency HH/DME [place]: [facility]-PT OT SP    DME : [place]: [facility]  Patient response to transportation need: Is the patient able to respond to transportation needs?: Yes In the past 12 months, has lack of transportation kept you from medical appointments or from getting medications?: No In the past 12 months, has lack of transportation kept you from meetings, work, or from getting things needed for daily living?: No    Comments (or additional information):WIFE WAS HERE PERIODICALLY AND SAW A FEW THERAPY SESSIONS UNLIKE LAST TIME STAYED HERE. WIFE NOT ABLE TO DRIVE HERE WAITS FOR DAUGHTER TO BRING WHO WORKS THIRD SHIFT. WIFE WANTS TO TAKE HOME AND TRY HER BEST TO TAKE CARE OF HIM. SHE IS LOOKING INTO GETTING A RAMP FOR THE STEPS INTO HOME. PT WILL GO HOME NON-EMERGENCY AMBULANCE. PA TO CALL WIFE TO  GO OVER DC INSTRUCTIONS  Patient/Family verbalized understanding of follow-up arrangements:  Yes  Individual responsible for coordination of the follow-up plan: Mitchell Washington [phone]  Confirmed correct DME delivered: Mitchell Washington 09/02/2022    Mitchell Washington

## 2022-09-02 NOTE — Progress Notes (Signed)
Physical Therapy Session Note  Patient Details  Name: Mitchell Washington MRN: [id number] Date of Birth: March 23, 1951  Today's Date: 09/02/2022 PT Individual Time: 1115-1200 PT Individual Time Calculation (min): 45 min   Short Term Goals: Week 2:  PT Short Term Goal 1 (Week 2): = LTG due to ELOS  Skilled Therapeutic Interventions/Progress Updates:     Pt in bed sleeping on arrival - awakens to voice but need max encouragement and coaxing to participate in therapy session. Pt denies pain, very flat affect. No family present for family training.   With HOB flat to simulate home, pt needing modA for supine to sitting for trunk support only. Pt lacks adequate initiation/effort or ability to recall compensatory strategies that he was previously taught. Needs minA for forward scooting to EOB.   Sit<>stand to RW from low EOB, needing minA for initiation and for powering to rise. Stand<>Pivot transfer using RW with CGA, poor controlled lowering to sit in w/c.  Transported to main rehab gym for time management.   Instructed in gait training in rehab unit - needing minA for standing from w/c to RW with cues for hand placement and forward weight shifting. Gait training ~248ft with minA and RW on level surfaces - cues for increasing L step length and gait speed. Mild L knee buckling vs hyperextension when fatigued after >143ft.   Stair training using 6inch steps and 2 hand rails. Pt requiring heavy modA to safely navigate up/down x4 steps. Forward facing with step-to pattern. Increased instability and fear of falling while descending the stairs. Has trouble with motor planning and sequencing. Due to severity of difficulty - communicated with CSW that patient will require non-emergent ambulance transport home due to safety concerns.   Pt completed x5 minutes of Kinetron at w/c level, resistance set to 70 cm/sec - TC for assisting in initiation and effort - cues for full ROM bilaterally.   Returned to room  and patient encouraged to stay sitting in w/c for lunch - safety belt alarm on, 1/2 lap tray for RUE. All needs me.t   Therapy Documentation Precautions:  Precautions Precautions: Fall Precaution Comments: L hemiparesis, dementia, apathy towards therapy Restrictions Weight Bearing Restrictions: No General:      Therapy/Group: Individual Therapy  Syna Gad P Bettylou Frew PT 09/02/2022, 7:59 AM

## 2022-09-02 NOTE — Progress Notes (Signed)
Speech Language Pathology Discharge Summary  Patient Details  Name: Mitchell Washington MRN: [id number] Date of Birth: Oct 17, 1950  Date of Discharge from SLP service:September 02, 2022  Today's Date: 09/02/2022 SLP Individual Time: 9379-0240 SLP Individual Time Calculation (min): 30 min  Skilled Therapeutic Interventions: Skilled ST treatment focused on speech goals. Pt greeted sleeping soundly on arrival and roused to moderate verbal stimuli. Pt agreeable to ST intervention however only if he could do so from bed per his request. SLP reviewed speech intelligibility strategies with mod A verbal cues for use of external aid as reminder. Pt implemented increased vocal intensity, pacing techniques, and over articulation strategies with max fading to mod A verbal cues to achieve ~50% intelligibility while verbalizing functional phrases and sentences. Pt was perceived as 75% intelligible with implementation of strategies at the word level. WITHOUT implementation of strategies, pt was perceived as ~25% intelligible with minimal to no attempts to repair. Pt benefited from ongoing encouragement and modeling from SLP. Patient was left in bed with alarm activated and immediate needs within reach at end of session.   Patient did not meet long term goals.  Patient to discharge at overall Mod level.  Reasons goals not met: Decreased carry over; poor utilization of speech intelligibility strategies; baseline cognitive deficits impacting carry over.   Clinical Impression/Discharge Summary: Patient has made minimal gains and did not meet long-term goal this admission due to decreased carry over and poor utilization of speech intelligibility strategies. Overall, patient is ~50-60% intelligible at the phrase and sentence level during structured language tasks with at least Mod A multimodal cues needed for use of speech intelligibility strategies. Patient to discharge at overall mod A level from a communication perspective.  Family was not present for education. Patient's care partner is independent to provide the necessary physical and cognitive assistance at discharge. Patient would benefit from continued SLP services in [facility] setting to maximize speech function and functional independence.   Care Partner:  Caregiver Able to Provide Assistance: Yes  Type of Caregiver Assistance: Physical;Cognitive  Recommendation:  Home Health SLP;24 hour supervision/assistance  Rationale for SLP Follow Up: Maximize functional communication;Reduce caregiver burden   Equipment: N/A   Reasons for discharge: Discharged from hospital   Patient/Family Agrees with Progress Made and Goals Achieved: Yes    Patty Sermons 09/02/2022, 12:32 PM

## 2022-09-02 NOTE — Plan of Care (Signed)
  Problem: RH Expression Communication Goal: LTG Patient will increase speech intelligibility (SLP) Description: LTG: Patient will increase speech intelligibility at word/phrase/conversation level with cues, % of the time (SLP) Outcome: Not Met (add Reason)

## 2022-09-03 LAB — GLUCOSE, CAPILLARY: Glucose-Capillary: 87 mg/dL (ref 70–99)

## 2022-09-03 NOTE — Progress Notes (Signed)
PROGRESS NOTE   Subjective/Complaints: No new complaints this morning Plan for d/c today Patient's chart reviewed- No issues reported overnight Vitals signs stable   ROS: Limited by mental status   Objective:   No results found. Recent Labs    09/02/22 0646  WBC 7.3  HGB 12.4*  HCT 38.8*  PLT 516*    Recent Labs    09/02/22 0646  NA 140  K 5.0  CL 107  CO2 22  GLUCOSE 106*  BUN 44*  CREATININE 1.93*  CALCIUM 9.5     Intake/Output Summary (Last 24 hours) at 09/03/2022 0915 Last data filed at 09/03/2022 1324 Gross per 24 hour  Intake 243 ml  Output --  Net 243 ml        Physical Exam: Vital Signs Blood pressure 129/66, pulse 77, temperature 97.7 F (36.5 C), resp. rate 17, height 5\' 9"  (1.753 m), weight 69.7 kg, SpO2 99 %.  Gen: no distress, normal appearing HEENT: oral mucosa pink and moist, NCAT Cardio: Reg rate Chest: normal effort, normal rate of breathing Abd: soft, non-distended Ext: no edema Psych: pleasant, normal affect Skin: intact Neuro: Patient is alert makes eye contact with examiner.  Follows simple commands.  Oriented to state but not city.  He can provide the month but not appropriate year. LUE and LLE 3/5 strength, 4/5 throughout otherwise     Assessment/Plan: 1. Functional deficits which require 3+ hours per day of interdisciplinary therapy in a comprehensive inpatient rehab setting. Physiatrist is providing close team supervision and 24 hour management of active medical problems listed below. Physiatrist and rehab team continue to assess barriers to discharge/monitor patient progress toward functional and medical goals  Care Tool:  Bathing    Body parts bathed by patient: Left arm, Chest, Abdomen, Front perineal area, Buttocks, Right upper leg, Left upper leg, Right lower leg, Left lower leg, Face   Body parts bathed by helper: Right arm, Right lower leg, Left lower leg,  Buttocks     Bathing assist Assist Level: Minimal Assistance - Patient > 75%     Upper Body Dressing/Undressing Upper body dressing   What is the patient wearing?: Pull over shirt    Upper body assist Assist Level: Minimal Assistance - Patient > 75%    Lower Body Dressing/Undressing Lower body dressing      What is the patient wearing?: Incontinence brief, Pants     Lower body assist Assist for lower body dressing: Moderate Assistance - Patient 50 - 74%     Toileting Toileting    Toileting assist Assist for toileting: Minimal Assistance - Patient > 75%     Transfers Chair/bed transfer  Transfers assist     Chair/bed transfer assist level: Minimal Assistance - Patient > 75% Chair/bed transfer assistive device: Walker,   Ambulation assist      Assist level: Minimal Assistance - Patient > 75% Assistive device: Walker-rolling Max distance: 200'   Walk 10 feet activity   Assist  Walk 10 feet activity did not occur: Safety/medical concerns  Assist level: Minimal Assistance - Patient > 75% Assistive device: Walker-rolling   Walk 50 feet activity  Assist Walk 50 feet with 2 turns activity did not occur: Safety/medical concerns  Assist level: Minimal Assistance - Patient > 75% Assistive device: Walker-rolling    Walk 150 feet activity   Assist Walk 150 feet activity did not occur: Safety/medical concerns  Assist level: Minimal Assistance - Patient > 75% Assistive device: Walker-rolling    Walk 10 feet on uneven surface  activity   Assist Walk 10 feet on uneven surfaces activity did not occur: Safety/medical concerns   Assist level: Minimal Assistance - Patient > 75% Assistive device: Walker-rolling   Wheelchair     Assist Is the patient using a wheelchair?: Yes Type of Wheelchair: Manual    Wheelchair assist level: Dependent - Patient 0%      Wheelchair 50 feet with 2 turns  activity    Assist        Assist Level: Dependent - Patient 0%   Wheelchair 150 feet activity     Assist      Assist Level: Dependent - Patient 0%   Blood pressure 129/66, pulse 77, temperature 97.7 F (36.5 C), resp. rate 17, height 5\' 9"  (1.753 m), weight 69.7 kg, SpO2 99 %.    Medical Problem List and Plan: 1. 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  MRI 12/27 slight interval expansion of previously an acute right pontine infarction             -patient may shower             -ELOS/Goals: 09/03/22         d/c home today 2.  Impaired mobility: continue Lovenox             -antiplatelet therapy: Aspirin 81 mg daily and Brilinta 90 mg twice daily 3. Left hip pain: tramadol added prn. Tylenol as needed. Aquatheramia ordered 4. Dementia: continue Aricept 10 mg nightly, Namenda 10 mg twice daily             -antipsychotic agents: N/A 5. Neuropsych/cognition: This patient is not capable of making decisions on his own behalf. 6. Skin/Wound Care: Routine skin checks 7. Fluids/Electrolytes/Nutrition: Routine in and outs with follow-up chemistries 8.  Hypertension.  d/c Lisinopril since resting BP is below goal and patient had symptomatic hypotension Monitor with increased mobility. DBP is below goal given stenosis, d/c norvasc. BP reviewed and well controlled  1/14- BP running [id number]- systolic- con't regimen 9.  Hyperlipidemia.  Lipitor, LDL reviewed and is 28.  10.  AKI on CKD stage III.  AKI felt to be related to hypotension.  Follow-up chemistries.  Baseline creatinine 1.38-1.86. Cr reviewed and is 1.58. d/c lisinopril  1/15- Creat near baseline BUN is elevated , IVF overnite  11.  Diabetes mellitus.  Hemoglobin A1c 6.5.  d/c glypizide. Magnesium reviewed and is normal. 1/14- CBGs  running 90s-120s- well controlled- con't regimen  12. Vitamin D deficiency: continue ergocalciferol 50,000U once per week 13. Fatigue: B12 reviewed and elevated. Metanx started. Appears to have worsened since starting mirtazepine, will d/c 14. Decreased appetite: Mirtazepine started at night but appears to have worsened fatigued to have d/ced 15. Flat affect: Mirtazepine ordered HS, but this appears to have worsened fatigue, so d/ced 16. Constipation: continue Senokot-S 1 tab BID   >30 minutes spent in discharge of patient including review of medications and follow-up appointments, physical examination, and in answering all patient's questions  LOS: 12 days A FACE TO FACE EVALUATION WAS PERFORMED  Sharad Vaneaton P Beckett Maden 09/03/2022, 9:15 AM

## 2022-09-03 NOTE — Progress Notes (Incomplete)
Patient discharge with

## 2022-09-03 NOTE — Progress Notes (Signed)
Patient d/c to home. Transportation by EMS. Seen by PA Linna Hoff on meds.

## 2022-09-05 DIAGNOSIS — I129 Hypertensive chronic kidney disease with stage 1 through stage 4 chronic kidney disease, or unspecified chronic kidney disease: Secondary | ICD-10-CM | POA: Diagnosis not present

## 2022-09-05 DIAGNOSIS — E1122 Type 2 diabetes mellitus with diabetic chronic kidney disease: Secondary | ICD-10-CM | POA: Diagnosis not present

## 2022-09-05 DIAGNOSIS — I69952 Hemiplegia and hemiparesis following unspecified cerebrovascular disease affecting left dominant side: Secondary | ICD-10-CM | POA: Diagnosis not present

## 2022-09-05 DIAGNOSIS — Z7984 Long term (current) use of oral hypoglycemic drugs: Secondary | ICD-10-CM | POA: Diagnosis not present

## 2022-09-05 DIAGNOSIS — E1140 Type 2 diabetes mellitus with diabetic neuropathy, unspecified: Secondary | ICD-10-CM | POA: Diagnosis not present

## 2022-09-05 DIAGNOSIS — F0390 Unspecified dementia without behavioral disturbance: Secondary | ICD-10-CM | POA: Diagnosis not present

## 2022-09-05 DIAGNOSIS — E7800 Pure hypercholesterolemia, unspecified: Secondary | ICD-10-CM | POA: Diagnosis not present

## 2022-09-05 DIAGNOSIS — N1832 Chronic kidney disease, stage 3b: Secondary | ICD-10-CM | POA: Diagnosis not present

## 2022-09-05 DIAGNOSIS — M6281 Muscle weakness (generalized): Secondary | ICD-10-CM | POA: Diagnosis not present

## 2022-09-09 DIAGNOSIS — M6281 Muscle weakness (generalized): Secondary | ICD-10-CM | POA: Diagnosis not present

## 2022-09-09 DIAGNOSIS — E1122 Type 2 diabetes mellitus with diabetic chronic kidney disease: Secondary | ICD-10-CM | POA: Diagnosis not present

## 2022-09-09 DIAGNOSIS — I69952 Hemiplegia and hemiparesis following unspecified cerebrovascular disease affecting left dominant side: Secondary | ICD-10-CM | POA: Diagnosis not present

## 2022-09-09 DIAGNOSIS — E1140 Type 2 diabetes mellitus with diabetic neuropathy, unspecified: Secondary | ICD-10-CM | POA: Diagnosis not present

## 2022-09-09 DIAGNOSIS — N1832 Chronic kidney disease, stage 3b: Secondary | ICD-10-CM | POA: Diagnosis not present

## 2022-09-09 DIAGNOSIS — Z7984 Long term (current) use of oral hypoglycemic drugs: Secondary | ICD-10-CM | POA: Diagnosis not present

## 2022-09-09 DIAGNOSIS — F0390 Unspecified dementia without behavioral disturbance: Secondary | ICD-10-CM | POA: Diagnosis not present

## 2022-09-09 DIAGNOSIS — I129 Hypertensive chronic kidney disease with stage 1 through stage 4 chronic kidney disease, or unspecified chronic kidney disease: Secondary | ICD-10-CM | POA: Diagnosis not present

## 2022-09-09 DIAGNOSIS — E7800 Pure hypercholesterolemia, unspecified: Secondary | ICD-10-CM | POA: Diagnosis not present

## 2022-09-10 DIAGNOSIS — M6281 Muscle weakness (generalized): Secondary | ICD-10-CM | POA: Diagnosis not present

## 2022-09-10 DIAGNOSIS — E7800 Pure hypercholesterolemia, unspecified: Secondary | ICD-10-CM | POA: Diagnosis not present

## 2022-09-10 DIAGNOSIS — I69952 Hemiplegia and hemiparesis following unspecified cerebrovascular disease affecting left dominant side: Secondary | ICD-10-CM | POA: Diagnosis not present

## 2022-09-10 DIAGNOSIS — N1832 Chronic kidney disease, stage 3b: Secondary | ICD-10-CM | POA: Diagnosis not present

## 2022-09-10 DIAGNOSIS — E1140 Type 2 diabetes mellitus with diabetic neuropathy, unspecified: Secondary | ICD-10-CM | POA: Diagnosis not present

## 2022-09-10 DIAGNOSIS — I129 Hypertensive chronic kidney disease with stage 1 through stage 4 chronic kidney disease, or unspecified chronic kidney disease: Secondary | ICD-10-CM | POA: Diagnosis not present

## 2022-09-10 DIAGNOSIS — F0390 Unspecified dementia without behavioral disturbance: Secondary | ICD-10-CM | POA: Diagnosis not present

## 2022-09-10 DIAGNOSIS — Z7984 Long term (current) use of oral hypoglycemic drugs: Secondary | ICD-10-CM | POA: Diagnosis not present

## 2022-09-10 DIAGNOSIS — E1122 Type 2 diabetes mellitus with diabetic chronic kidney disease: Secondary | ICD-10-CM | POA: Diagnosis not present

## 2022-09-11 DIAGNOSIS — E119 Type 2 diabetes mellitus without complications: Secondary | ICD-10-CM | POA: Diagnosis not present

## 2022-09-11 DIAGNOSIS — Z961 Presence of intraocular lens: Secondary | ICD-10-CM | POA: Diagnosis not present

## 2022-09-11 DIAGNOSIS — H34812 Central retinal vein occlusion, left eye, with macular edema: Secondary | ICD-10-CM | POA: Diagnosis not present

## 2022-09-16 ENCOUNTER — Encounter: Payer: Medicare HMO | Admitting: Physical Medicine and Rehabilitation

## 2022-09-17 DIAGNOSIS — I10 Essential (primary) hypertension: Secondary | ICD-10-CM | POA: Diagnosis not present

## 2022-09-17 DIAGNOSIS — G309 Alzheimer's disease, unspecified: Secondary | ICD-10-CM | POA: Diagnosis not present

## 2022-09-17 DIAGNOSIS — I69354 Hemiplegia and hemiparesis following cerebral infarction affecting left non-dominant side: Secondary | ICD-10-CM | POA: Diagnosis not present

## 2022-09-18 DIAGNOSIS — F0390 Unspecified dementia without behavioral disturbance: Secondary | ICD-10-CM | POA: Diagnosis not present

## 2022-09-18 DIAGNOSIS — I69952 Hemiplegia and hemiparesis following unspecified cerebrovascular disease affecting left dominant side: Secondary | ICD-10-CM | POA: Diagnosis not present

## 2022-09-18 DIAGNOSIS — E1122 Type 2 diabetes mellitus with diabetic chronic kidney disease: Secondary | ICD-10-CM | POA: Diagnosis not present

## 2022-09-18 DIAGNOSIS — N1832 Chronic kidney disease, stage 3b: Secondary | ICD-10-CM | POA: Diagnosis not present

## 2022-09-18 DIAGNOSIS — E1140 Type 2 diabetes mellitus with diabetic neuropathy, unspecified: Secondary | ICD-10-CM | POA: Diagnosis not present

## 2022-09-18 DIAGNOSIS — Z7984 Long term (current) use of oral hypoglycemic drugs: Secondary | ICD-10-CM | POA: Diagnosis not present

## 2022-09-18 DIAGNOSIS — E7800 Pure hypercholesterolemia, unspecified: Secondary | ICD-10-CM | POA: Diagnosis not present

## 2022-09-18 DIAGNOSIS — I129 Hypertensive chronic kidney disease with stage 1 through stage 4 chronic kidney disease, or unspecified chronic kidney disease: Secondary | ICD-10-CM | POA: Diagnosis not present

## 2022-09-18 DIAGNOSIS — M6281 Muscle weakness (generalized): Secondary | ICD-10-CM | POA: Diagnosis not present

## 2022-09-19 DIAGNOSIS — Z7984 Long term (current) use of oral hypoglycemic drugs: Secondary | ICD-10-CM | POA: Diagnosis not present

## 2022-09-19 DIAGNOSIS — E1140 Type 2 diabetes mellitus with diabetic neuropathy, unspecified: Secondary | ICD-10-CM | POA: Diagnosis not present

## 2022-09-19 DIAGNOSIS — F0390 Unspecified dementia without behavioral disturbance: Secondary | ICD-10-CM | POA: Diagnosis not present

## 2022-09-19 DIAGNOSIS — E1122 Type 2 diabetes mellitus with diabetic chronic kidney disease: Secondary | ICD-10-CM | POA: Diagnosis not present

## 2022-09-19 DIAGNOSIS — E7800 Pure hypercholesterolemia, unspecified: Secondary | ICD-10-CM | POA: Diagnosis not present

## 2022-09-19 DIAGNOSIS — M6281 Muscle weakness (generalized): Secondary | ICD-10-CM | POA: Diagnosis not present

## 2022-09-19 DIAGNOSIS — I129 Hypertensive chronic kidney disease with stage 1 through stage 4 chronic kidney disease, or unspecified chronic kidney disease: Secondary | ICD-10-CM | POA: Diagnosis not present

## 2022-09-19 DIAGNOSIS — N1832 Chronic kidney disease, stage 3b: Secondary | ICD-10-CM | POA: Diagnosis not present

## 2022-09-19 DIAGNOSIS — I69952 Hemiplegia and hemiparesis following unspecified cerebrovascular disease affecting left dominant side: Secondary | ICD-10-CM | POA: Diagnosis not present

## 2022-09-20 DIAGNOSIS — E1140 Type 2 diabetes mellitus with diabetic neuropathy, unspecified: Secondary | ICD-10-CM | POA: Diagnosis not present

## 2022-09-20 DIAGNOSIS — E1122 Type 2 diabetes mellitus with diabetic chronic kidney disease: Secondary | ICD-10-CM | POA: Diagnosis not present

## 2022-09-20 DIAGNOSIS — M6281 Muscle weakness (generalized): Secondary | ICD-10-CM | POA: Diagnosis not present

## 2022-09-20 DIAGNOSIS — F0390 Unspecified dementia without behavioral disturbance: Secondary | ICD-10-CM | POA: Diagnosis not present

## 2022-09-20 DIAGNOSIS — I129 Hypertensive chronic kidney disease with stage 1 through stage 4 chronic kidney disease, or unspecified chronic kidney disease: Secondary | ICD-10-CM | POA: Diagnosis not present

## 2022-09-20 DIAGNOSIS — I69952 Hemiplegia and hemiparesis following unspecified cerebrovascular disease affecting left dominant side: Secondary | ICD-10-CM | POA: Diagnosis not present

## 2022-09-20 DIAGNOSIS — E7800 Pure hypercholesterolemia, unspecified: Secondary | ICD-10-CM | POA: Diagnosis not present

## 2022-09-20 DIAGNOSIS — N1832 Chronic kidney disease, stage 3b: Secondary | ICD-10-CM | POA: Diagnosis not present

## 2022-09-20 DIAGNOSIS — Z7984 Long term (current) use of oral hypoglycemic drugs: Secondary | ICD-10-CM | POA: Diagnosis not present

## 2022-09-23 DIAGNOSIS — M6281 Muscle weakness (generalized): Secondary | ICD-10-CM | POA: Diagnosis not present

## 2022-09-23 DIAGNOSIS — I69952 Hemiplegia and hemiparesis following unspecified cerebrovascular disease affecting left dominant side: Secondary | ICD-10-CM | POA: Diagnosis not present

## 2022-09-23 DIAGNOSIS — E7800 Pure hypercholesterolemia, unspecified: Secondary | ICD-10-CM | POA: Diagnosis not present

## 2022-09-23 DIAGNOSIS — I129 Hypertensive chronic kidney disease with stage 1 through stage 4 chronic kidney disease, or unspecified chronic kidney disease: Secondary | ICD-10-CM | POA: Diagnosis not present

## 2022-09-23 DIAGNOSIS — F0390 Unspecified dementia without behavioral disturbance: Secondary | ICD-10-CM | POA: Diagnosis not present

## 2022-09-23 DIAGNOSIS — N1832 Chronic kidney disease, stage 3b: Secondary | ICD-10-CM | POA: Diagnosis not present

## 2022-09-23 DIAGNOSIS — E1122 Type 2 diabetes mellitus with diabetic chronic kidney disease: Secondary | ICD-10-CM | POA: Diagnosis not present

## 2022-09-23 DIAGNOSIS — E1140 Type 2 diabetes mellitus with diabetic neuropathy, unspecified: Secondary | ICD-10-CM | POA: Diagnosis not present

## 2022-09-23 DIAGNOSIS — Z7984 Long term (current) use of oral hypoglycemic drugs: Secondary | ICD-10-CM | POA: Diagnosis not present

## 2022-09-24 DIAGNOSIS — Z7984 Long term (current) use of oral hypoglycemic drugs: Secondary | ICD-10-CM | POA: Diagnosis not present

## 2022-09-24 DIAGNOSIS — N1832 Chronic kidney disease, stage 3b: Secondary | ICD-10-CM | POA: Diagnosis not present

## 2022-09-24 DIAGNOSIS — E1140 Type 2 diabetes mellitus with diabetic neuropathy, unspecified: Secondary | ICD-10-CM | POA: Diagnosis not present

## 2022-09-24 DIAGNOSIS — M6281 Muscle weakness (generalized): Secondary | ICD-10-CM | POA: Diagnosis not present

## 2022-09-24 DIAGNOSIS — E7800 Pure hypercholesterolemia, unspecified: Secondary | ICD-10-CM | POA: Diagnosis not present

## 2022-09-24 DIAGNOSIS — I129 Hypertensive chronic kidney disease with stage 1 through stage 4 chronic kidney disease, or unspecified chronic kidney disease: Secondary | ICD-10-CM | POA: Diagnosis not present

## 2022-09-24 DIAGNOSIS — E1122 Type 2 diabetes mellitus with diabetic chronic kidney disease: Secondary | ICD-10-CM | POA: Diagnosis not present

## 2022-09-24 DIAGNOSIS — F0390 Unspecified dementia without behavioral disturbance: Secondary | ICD-10-CM | POA: Diagnosis not present

## 2022-09-24 DIAGNOSIS — I69952 Hemiplegia and hemiparesis following unspecified cerebrovascular disease affecting left dominant side: Secondary | ICD-10-CM | POA: Diagnosis not present

## 2022-09-25 DIAGNOSIS — M6281 Muscle weakness (generalized): Secondary | ICD-10-CM | POA: Diagnosis not present

## 2022-09-25 DIAGNOSIS — I129 Hypertensive chronic kidney disease with stage 1 through stage 4 chronic kidney disease, or unspecified chronic kidney disease: Secondary | ICD-10-CM | POA: Diagnosis not present

## 2022-09-25 DIAGNOSIS — I69952 Hemiplegia and hemiparesis following unspecified cerebrovascular disease affecting left dominant side: Secondary | ICD-10-CM | POA: Diagnosis not present

## 2022-09-25 DIAGNOSIS — F0390 Unspecified dementia without behavioral disturbance: Secondary | ICD-10-CM | POA: Diagnosis not present

## 2022-09-25 DIAGNOSIS — E7800 Pure hypercholesterolemia, unspecified: Secondary | ICD-10-CM | POA: Diagnosis not present

## 2022-09-25 DIAGNOSIS — N1832 Chronic kidney disease, stage 3b: Secondary | ICD-10-CM | POA: Diagnosis not present

## 2022-09-25 DIAGNOSIS — E1140 Type 2 diabetes mellitus with diabetic neuropathy, unspecified: Secondary | ICD-10-CM | POA: Diagnosis not present

## 2022-09-25 DIAGNOSIS — Z7984 Long term (current) use of oral hypoglycemic drugs: Secondary | ICD-10-CM | POA: Diagnosis not present

## 2022-09-25 DIAGNOSIS — E1122 Type 2 diabetes mellitus with diabetic chronic kidney disease: Secondary | ICD-10-CM | POA: Diagnosis not present

## 2022-09-26 ENCOUNTER — Inpatient Hospital Stay: Payer: BLUE CROSS/BLUE SHIELD | Admitting: Neurology

## 2022-09-26 DIAGNOSIS — E7800 Pure hypercholesterolemia, unspecified: Secondary | ICD-10-CM | POA: Diagnosis not present

## 2022-09-26 DIAGNOSIS — I129 Hypertensive chronic kidney disease with stage 1 through stage 4 chronic kidney disease, or unspecified chronic kidney disease: Secondary | ICD-10-CM | POA: Diagnosis not present

## 2022-09-26 DIAGNOSIS — E1140 Type 2 diabetes mellitus with diabetic neuropathy, unspecified: Secondary | ICD-10-CM | POA: Diagnosis not present

## 2022-09-26 DIAGNOSIS — M6281 Muscle weakness (generalized): Secondary | ICD-10-CM | POA: Diagnosis not present

## 2022-09-26 DIAGNOSIS — N1832 Chronic kidney disease, stage 3b: Secondary | ICD-10-CM | POA: Diagnosis not present

## 2022-09-26 DIAGNOSIS — E1122 Type 2 diabetes mellitus with diabetic chronic kidney disease: Secondary | ICD-10-CM | POA: Diagnosis not present

## 2022-09-26 DIAGNOSIS — I69952 Hemiplegia and hemiparesis following unspecified cerebrovascular disease affecting left dominant side: Secondary | ICD-10-CM | POA: Diagnosis not present

## 2022-09-26 DIAGNOSIS — Z7984 Long term (current) use of oral hypoglycemic drugs: Secondary | ICD-10-CM | POA: Diagnosis not present

## 2022-09-26 DIAGNOSIS — F0390 Unspecified dementia without behavioral disturbance: Secondary | ICD-10-CM | POA: Diagnosis not present

## 2022-09-27 DIAGNOSIS — E7800 Pure hypercholesterolemia, unspecified: Secondary | ICD-10-CM | POA: Diagnosis not present

## 2022-09-27 DIAGNOSIS — E1140 Type 2 diabetes mellitus with diabetic neuropathy, unspecified: Secondary | ICD-10-CM | POA: Diagnosis not present

## 2022-09-27 DIAGNOSIS — N1832 Chronic kidney disease, stage 3b: Secondary | ICD-10-CM | POA: Diagnosis not present

## 2022-09-27 DIAGNOSIS — I69952 Hemiplegia and hemiparesis following unspecified cerebrovascular disease affecting left dominant side: Secondary | ICD-10-CM | POA: Diagnosis not present

## 2022-09-27 DIAGNOSIS — Z7984 Long term (current) use of oral hypoglycemic drugs: Secondary | ICD-10-CM | POA: Diagnosis not present

## 2022-09-27 DIAGNOSIS — I129 Hypertensive chronic kidney disease with stage 1 through stage 4 chronic kidney disease, or unspecified chronic kidney disease: Secondary | ICD-10-CM | POA: Diagnosis not present

## 2022-09-27 DIAGNOSIS — M6281 Muscle weakness (generalized): Secondary | ICD-10-CM | POA: Diagnosis not present

## 2022-09-27 DIAGNOSIS — E1122 Type 2 diabetes mellitus with diabetic chronic kidney disease: Secondary | ICD-10-CM | POA: Diagnosis not present

## 2022-09-27 DIAGNOSIS — F0390 Unspecified dementia without behavioral disturbance: Secondary | ICD-10-CM | POA: Diagnosis not present

## 2022-09-30 DIAGNOSIS — E1122 Type 2 diabetes mellitus with diabetic chronic kidney disease: Secondary | ICD-10-CM | POA: Diagnosis not present

## 2022-09-30 DIAGNOSIS — E1140 Type 2 diabetes mellitus with diabetic neuropathy, unspecified: Secondary | ICD-10-CM | POA: Diagnosis not present

## 2022-09-30 DIAGNOSIS — N1832 Chronic kidney disease, stage 3b: Secondary | ICD-10-CM | POA: Diagnosis not present

## 2022-09-30 DIAGNOSIS — Z7984 Long term (current) use of oral hypoglycemic drugs: Secondary | ICD-10-CM | POA: Diagnosis not present

## 2022-09-30 DIAGNOSIS — E7800 Pure hypercholesterolemia, unspecified: Secondary | ICD-10-CM | POA: Diagnosis not present

## 2022-09-30 DIAGNOSIS — M6281 Muscle weakness (generalized): Secondary | ICD-10-CM | POA: Diagnosis not present

## 2022-09-30 DIAGNOSIS — F0390 Unspecified dementia without behavioral disturbance: Secondary | ICD-10-CM | POA: Diagnosis not present

## 2022-09-30 DIAGNOSIS — I69952 Hemiplegia and hemiparesis following unspecified cerebrovascular disease affecting left dominant side: Secondary | ICD-10-CM | POA: Diagnosis not present

## 2022-09-30 DIAGNOSIS — I129 Hypertensive chronic kidney disease with stage 1 through stage 4 chronic kidney disease, or unspecified chronic kidney disease: Secondary | ICD-10-CM | POA: Diagnosis not present

## 2022-10-01 DIAGNOSIS — I69952 Hemiplegia and hemiparesis following unspecified cerebrovascular disease affecting left dominant side: Secondary | ICD-10-CM | POA: Diagnosis not present

## 2022-10-01 DIAGNOSIS — Z7984 Long term (current) use of oral hypoglycemic drugs: Secondary | ICD-10-CM | POA: Diagnosis not present

## 2022-10-01 DIAGNOSIS — F0390 Unspecified dementia without behavioral disturbance: Secondary | ICD-10-CM | POA: Diagnosis not present

## 2022-10-01 DIAGNOSIS — E7800 Pure hypercholesterolemia, unspecified: Secondary | ICD-10-CM | POA: Diagnosis not present

## 2022-10-01 DIAGNOSIS — M6281 Muscle weakness (generalized): Secondary | ICD-10-CM | POA: Diagnosis not present

## 2022-10-01 DIAGNOSIS — I129 Hypertensive chronic kidney disease with stage 1 through stage 4 chronic kidney disease, or unspecified chronic kidney disease: Secondary | ICD-10-CM | POA: Diagnosis not present

## 2022-10-01 DIAGNOSIS — N1832 Chronic kidney disease, stage 3b: Secondary | ICD-10-CM | POA: Diagnosis not present

## 2022-10-01 DIAGNOSIS — E1122 Type 2 diabetes mellitus with diabetic chronic kidney disease: Secondary | ICD-10-CM | POA: Diagnosis not present

## 2022-10-01 DIAGNOSIS — E1140 Type 2 diabetes mellitus with diabetic neuropathy, unspecified: Secondary | ICD-10-CM | POA: Diagnosis not present

## 2022-10-02 ENCOUNTER — Other Ambulatory Visit (HOSPITAL_COMMUNITY): Payer: Self-pay

## 2022-10-02 DIAGNOSIS — M6281 Muscle weakness (generalized): Secondary | ICD-10-CM | POA: Diagnosis not present

## 2022-10-02 DIAGNOSIS — E1140 Type 2 diabetes mellitus with diabetic neuropathy, unspecified: Secondary | ICD-10-CM | POA: Diagnosis not present

## 2022-10-02 DIAGNOSIS — I129 Hypertensive chronic kidney disease with stage 1 through stage 4 chronic kidney disease, or unspecified chronic kidney disease: Secondary | ICD-10-CM | POA: Diagnosis not present

## 2022-10-02 DIAGNOSIS — E1122 Type 2 diabetes mellitus with diabetic chronic kidney disease: Secondary | ICD-10-CM | POA: Diagnosis not present

## 2022-10-02 DIAGNOSIS — Z7984 Long term (current) use of oral hypoglycemic drugs: Secondary | ICD-10-CM | POA: Diagnosis not present

## 2022-10-02 DIAGNOSIS — E7800 Pure hypercholesterolemia, unspecified: Secondary | ICD-10-CM | POA: Diagnosis not present

## 2022-10-02 DIAGNOSIS — N1832 Chronic kidney disease, stage 3b: Secondary | ICD-10-CM | POA: Diagnosis not present

## 2022-10-02 DIAGNOSIS — F0390 Unspecified dementia without behavioral disturbance: Secondary | ICD-10-CM | POA: Diagnosis not present

## 2022-10-02 DIAGNOSIS — I69952 Hemiplegia and hemiparesis following unspecified cerebrovascular disease affecting left dominant side: Secondary | ICD-10-CM | POA: Diagnosis not present

## 2022-10-04 DIAGNOSIS — F0390 Unspecified dementia without behavioral disturbance: Secondary | ICD-10-CM | POA: Diagnosis not present

## 2022-10-04 DIAGNOSIS — I69398 Other sequelae of cerebral infarction: Secondary | ICD-10-CM | POA: Diagnosis not present

## 2022-10-05 DIAGNOSIS — F0390 Unspecified dementia without behavioral disturbance: Secondary | ICD-10-CM | POA: Diagnosis not present

## 2022-10-05 DIAGNOSIS — N1832 Chronic kidney disease, stage 3b: Secondary | ICD-10-CM | POA: Diagnosis not present

## 2022-10-05 DIAGNOSIS — E1140 Type 2 diabetes mellitus with diabetic neuropathy, unspecified: Secondary | ICD-10-CM | POA: Diagnosis not present

## 2022-10-05 DIAGNOSIS — E1122 Type 2 diabetes mellitus with diabetic chronic kidney disease: Secondary | ICD-10-CM | POA: Diagnosis not present

## 2022-10-05 DIAGNOSIS — I69952 Hemiplegia and hemiparesis following unspecified cerebrovascular disease affecting left dominant side: Secondary | ICD-10-CM | POA: Diagnosis not present

## 2022-10-05 DIAGNOSIS — Z7984 Long term (current) use of oral hypoglycemic drugs: Secondary | ICD-10-CM | POA: Diagnosis not present

## 2022-10-05 DIAGNOSIS — E7800 Pure hypercholesterolemia, unspecified: Secondary | ICD-10-CM | POA: Diagnosis not present

## 2022-10-05 DIAGNOSIS — I129 Hypertensive chronic kidney disease with stage 1 through stage 4 chronic kidney disease, or unspecified chronic kidney disease: Secondary | ICD-10-CM | POA: Diagnosis not present

## 2022-10-05 DIAGNOSIS — M6281 Muscle weakness (generalized): Secondary | ICD-10-CM | POA: Diagnosis not present

## 2022-10-08 DIAGNOSIS — Z7984 Long term (current) use of oral hypoglycemic drugs: Secondary | ICD-10-CM | POA: Diagnosis not present

## 2022-10-08 DIAGNOSIS — E7800 Pure hypercholesterolemia, unspecified: Secondary | ICD-10-CM | POA: Diagnosis not present

## 2022-10-08 DIAGNOSIS — N1832 Chronic kidney disease, stage 3b: Secondary | ICD-10-CM | POA: Diagnosis not present

## 2022-10-08 DIAGNOSIS — M6281 Muscle weakness (generalized): Secondary | ICD-10-CM | POA: Diagnosis not present

## 2022-10-08 DIAGNOSIS — I69952 Hemiplegia and hemiparesis following unspecified cerebrovascular disease affecting left dominant side: Secondary | ICD-10-CM | POA: Diagnosis not present

## 2022-10-08 DIAGNOSIS — F0390 Unspecified dementia without behavioral disturbance: Secondary | ICD-10-CM | POA: Diagnosis not present

## 2022-10-08 DIAGNOSIS — E1122 Type 2 diabetes mellitus with diabetic chronic kidney disease: Secondary | ICD-10-CM | POA: Diagnosis not present

## 2022-10-08 DIAGNOSIS — E1140 Type 2 diabetes mellitus with diabetic neuropathy, unspecified: Secondary | ICD-10-CM | POA: Diagnosis not present

## 2022-10-08 DIAGNOSIS — I129 Hypertensive chronic kidney disease with stage 1 through stage 4 chronic kidney disease, or unspecified chronic kidney disease: Secondary | ICD-10-CM | POA: Diagnosis not present

## 2022-10-10 DIAGNOSIS — E1140 Type 2 diabetes mellitus with diabetic neuropathy, unspecified: Secondary | ICD-10-CM | POA: Diagnosis not present

## 2022-10-10 DIAGNOSIS — M6281 Muscle weakness (generalized): Secondary | ICD-10-CM | POA: Diagnosis not present

## 2022-10-10 DIAGNOSIS — Z7984 Long term (current) use of oral hypoglycemic drugs: Secondary | ICD-10-CM | POA: Diagnosis not present

## 2022-10-10 DIAGNOSIS — I129 Hypertensive chronic kidney disease with stage 1 through stage 4 chronic kidney disease, or unspecified chronic kidney disease: Secondary | ICD-10-CM | POA: Diagnosis not present

## 2022-10-10 DIAGNOSIS — E7800 Pure hypercholesterolemia, unspecified: Secondary | ICD-10-CM | POA: Diagnosis not present

## 2022-10-10 DIAGNOSIS — F0390 Unspecified dementia without behavioral disturbance: Secondary | ICD-10-CM | POA: Diagnosis not present

## 2022-10-10 DIAGNOSIS — I69952 Hemiplegia and hemiparesis following unspecified cerebrovascular disease affecting left dominant side: Secondary | ICD-10-CM | POA: Diagnosis not present

## 2022-10-10 DIAGNOSIS — E1122 Type 2 diabetes mellitus with diabetic chronic kidney disease: Secondary | ICD-10-CM | POA: Diagnosis not present

## 2022-10-10 DIAGNOSIS — N1832 Chronic kidney disease, stage 3b: Secondary | ICD-10-CM | POA: Diagnosis not present

## 2022-10-14 DIAGNOSIS — N1832 Chronic kidney disease, stage 3b: Secondary | ICD-10-CM | POA: Diagnosis not present

## 2022-10-14 DIAGNOSIS — Z7984 Long term (current) use of oral hypoglycemic drugs: Secondary | ICD-10-CM | POA: Diagnosis not present

## 2022-10-14 DIAGNOSIS — M6281 Muscle weakness (generalized): Secondary | ICD-10-CM | POA: Diagnosis not present

## 2022-10-14 DIAGNOSIS — E7800 Pure hypercholesterolemia, unspecified: Secondary | ICD-10-CM | POA: Diagnosis not present

## 2022-10-14 DIAGNOSIS — I69952 Hemiplegia and hemiparesis following unspecified cerebrovascular disease affecting left dominant side: Secondary | ICD-10-CM | POA: Diagnosis not present

## 2022-10-14 DIAGNOSIS — I129 Hypertensive chronic kidney disease with stage 1 through stage 4 chronic kidney disease, or unspecified chronic kidney disease: Secondary | ICD-10-CM | POA: Diagnosis not present

## 2022-10-14 DIAGNOSIS — E1122 Type 2 diabetes mellitus with diabetic chronic kidney disease: Secondary | ICD-10-CM | POA: Diagnosis not present

## 2022-10-14 DIAGNOSIS — F0390 Unspecified dementia without behavioral disturbance: Secondary | ICD-10-CM | POA: Diagnosis not present

## 2022-10-14 DIAGNOSIS — E1140 Type 2 diabetes mellitus with diabetic neuropathy, unspecified: Secondary | ICD-10-CM | POA: Diagnosis not present

## 2022-10-15 DIAGNOSIS — Z7984 Long term (current) use of oral hypoglycemic drugs: Secondary | ICD-10-CM | POA: Diagnosis not present

## 2022-10-15 DIAGNOSIS — E1122 Type 2 diabetes mellitus with diabetic chronic kidney disease: Secondary | ICD-10-CM | POA: Diagnosis not present

## 2022-10-15 DIAGNOSIS — F0390 Unspecified dementia without behavioral disturbance: Secondary | ICD-10-CM | POA: Diagnosis not present

## 2022-10-15 DIAGNOSIS — I129 Hypertensive chronic kidney disease with stage 1 through stage 4 chronic kidney disease, or unspecified chronic kidney disease: Secondary | ICD-10-CM | POA: Diagnosis not present

## 2022-10-15 DIAGNOSIS — E7800 Pure hypercholesterolemia, unspecified: Secondary | ICD-10-CM | POA: Diagnosis not present

## 2022-10-15 DIAGNOSIS — N1832 Chronic kidney disease, stage 3b: Secondary | ICD-10-CM | POA: Diagnosis not present

## 2022-10-15 DIAGNOSIS — M6281 Muscle weakness (generalized): Secondary | ICD-10-CM | POA: Diagnosis not present

## 2022-10-15 DIAGNOSIS — E1140 Type 2 diabetes mellitus with diabetic neuropathy, unspecified: Secondary | ICD-10-CM | POA: Diagnosis not present

## 2022-10-15 DIAGNOSIS — I69952 Hemiplegia and hemiparesis following unspecified cerebrovascular disease affecting left dominant side: Secondary | ICD-10-CM | POA: Diagnosis not present

## 2022-10-16 DIAGNOSIS — E1122 Type 2 diabetes mellitus with diabetic chronic kidney disease: Secondary | ICD-10-CM | POA: Diagnosis not present

## 2022-10-16 DIAGNOSIS — Z7984 Long term (current) use of oral hypoglycemic drugs: Secondary | ICD-10-CM | POA: Diagnosis not present

## 2022-10-16 DIAGNOSIS — N1832 Chronic kidney disease, stage 3b: Secondary | ICD-10-CM | POA: Diagnosis not present

## 2022-10-16 DIAGNOSIS — E7800 Pure hypercholesterolemia, unspecified: Secondary | ICD-10-CM | POA: Diagnosis not present

## 2022-10-16 DIAGNOSIS — M6281 Muscle weakness (generalized): Secondary | ICD-10-CM | POA: Diagnosis not present

## 2022-10-16 DIAGNOSIS — I129 Hypertensive chronic kidney disease with stage 1 through stage 4 chronic kidney disease, or unspecified chronic kidney disease: Secondary | ICD-10-CM | POA: Diagnosis not present

## 2022-10-16 DIAGNOSIS — I69952 Hemiplegia and hemiparesis following unspecified cerebrovascular disease affecting left dominant side: Secondary | ICD-10-CM | POA: Diagnosis not present

## 2022-10-16 DIAGNOSIS — F0390 Unspecified dementia without behavioral disturbance: Secondary | ICD-10-CM | POA: Diagnosis not present

## 2022-10-16 DIAGNOSIS — E1140 Type 2 diabetes mellitus with diabetic neuropathy, unspecified: Secondary | ICD-10-CM | POA: Diagnosis not present

## 2022-10-17 ENCOUNTER — Ambulatory Visit: Payer: Medicare HMO | Admitting: Podiatry

## 2022-10-17 ENCOUNTER — Encounter: Payer: Self-pay | Admitting: Podiatry

## 2022-10-17 VITALS — BP 183/76

## 2022-10-17 DIAGNOSIS — M79674 Pain in right toe(s): Secondary | ICD-10-CM | POA: Diagnosis not present

## 2022-10-17 DIAGNOSIS — B351 Tinea unguium: Secondary | ICD-10-CM

## 2022-10-17 DIAGNOSIS — M79675 Pain in left toe(s): Secondary | ICD-10-CM | POA: Diagnosis not present

## 2022-10-17 DIAGNOSIS — E1151 Type 2 diabetes mellitus with diabetic peripheral angiopathy without gangrene: Secondary | ICD-10-CM

## 2022-10-17 NOTE — Progress Notes (Signed)
This patient returns to my office for at risk foot care.  This patient requires this care by a professional since this patient will be at risk due to having diabetes. and CKD.   This patient is unable to cut nails himself since the patient cannot reach his nails.These nails are painful walking and wearing shoes.  This patient presents for at risk foot care today.  General Appearance  Alert, conversant and in no acute stress.  Vascular  Dorsalis pedis and posterior tibial  pulses are weaklypalpable  bilaterally.  Capillary return is within normal limits  bilaterally. Temperature is within normal limits  bilaterally.  Neurologic  Senn-Weinstein monofilament wire test within normal limits  bilaterally. Muscle power within normal limits bilaterally.  Nails Thick disfigured discolored nails with subungual debris  from hallux to fifth toes bilaterally. No evidence of bacterial infection or drainage bilaterally.  Orthopedic  No limitations of motion  feet .  No crepitus or effusions noted.  No bony pathology or digital deformities noted.  Skin  normotropic skin with no porokeratosis noted bilaterally.  No signs of infections or ulcers noted.     Onychomycosis  Pain in right toes  Pain in left toes  Consent was obtained for treatment procedures.   Mechanical debridement of nails 1-5  bilaterally performed with a nail nipper.  Filed with dremel without incident.    Return office visit    3 months                 Told patient to return for periodic foot care and evaluation due to potential at risk complications.   Gardiner Barefoot DPM

## 2022-10-18 DIAGNOSIS — I129 Hypertensive chronic kidney disease with stage 1 through stage 4 chronic kidney disease, or unspecified chronic kidney disease: Secondary | ICD-10-CM | POA: Diagnosis not present

## 2022-10-18 DIAGNOSIS — Z7984 Long term (current) use of oral hypoglycemic drugs: Secondary | ICD-10-CM | POA: Diagnosis not present

## 2022-10-18 DIAGNOSIS — E1122 Type 2 diabetes mellitus with diabetic chronic kidney disease: Secondary | ICD-10-CM | POA: Diagnosis not present

## 2022-10-18 DIAGNOSIS — N1832 Chronic kidney disease, stage 3b: Secondary | ICD-10-CM | POA: Diagnosis not present

## 2022-10-18 DIAGNOSIS — F0390 Unspecified dementia without behavioral disturbance: Secondary | ICD-10-CM | POA: Diagnosis not present

## 2022-10-18 DIAGNOSIS — E7800 Pure hypercholesterolemia, unspecified: Secondary | ICD-10-CM | POA: Diagnosis not present

## 2022-10-18 DIAGNOSIS — I69952 Hemiplegia and hemiparesis following unspecified cerebrovascular disease affecting left dominant side: Secondary | ICD-10-CM | POA: Diagnosis not present

## 2022-10-18 DIAGNOSIS — M6281 Muscle weakness (generalized): Secondary | ICD-10-CM | POA: Diagnosis not present

## 2022-10-18 DIAGNOSIS — E1140 Type 2 diabetes mellitus with diabetic neuropathy, unspecified: Secondary | ICD-10-CM | POA: Diagnosis not present

## 2022-10-21 DIAGNOSIS — E1140 Type 2 diabetes mellitus with diabetic neuropathy, unspecified: Secondary | ICD-10-CM | POA: Diagnosis not present

## 2022-10-21 DIAGNOSIS — I69952 Hemiplegia and hemiparesis following unspecified cerebrovascular disease affecting left dominant side: Secondary | ICD-10-CM | POA: Diagnosis not present

## 2022-10-21 DIAGNOSIS — E7800 Pure hypercholesterolemia, unspecified: Secondary | ICD-10-CM | POA: Diagnosis not present

## 2022-10-21 DIAGNOSIS — Z7984 Long term (current) use of oral hypoglycemic drugs: Secondary | ICD-10-CM | POA: Diagnosis not present

## 2022-10-21 DIAGNOSIS — F0390 Unspecified dementia without behavioral disturbance: Secondary | ICD-10-CM | POA: Diagnosis not present

## 2022-10-21 DIAGNOSIS — I129 Hypertensive chronic kidney disease with stage 1 through stage 4 chronic kidney disease, or unspecified chronic kidney disease: Secondary | ICD-10-CM | POA: Diagnosis not present

## 2022-10-21 DIAGNOSIS — E1122 Type 2 diabetes mellitus with diabetic chronic kidney disease: Secondary | ICD-10-CM | POA: Diagnosis not present

## 2022-10-21 DIAGNOSIS — M6281 Muscle weakness (generalized): Secondary | ICD-10-CM | POA: Diagnosis not present

## 2022-10-21 DIAGNOSIS — N1832 Chronic kidney disease, stage 3b: Secondary | ICD-10-CM | POA: Diagnosis not present

## 2022-10-22 ENCOUNTER — Other Ambulatory Visit: Payer: Self-pay

## 2022-10-22 ENCOUNTER — Inpatient Hospital Stay
Admission: EM | Admit: 2022-10-22 | Discharge: 2022-10-26 | DRG: 378 | Disposition: A | Discharge status: Home / Self Care | Payer: Medicare HMO | Attending: Internal Medicine | Admitting: Internal Medicine

## 2022-10-22 DIAGNOSIS — E162 Hypoglycemia, unspecified: Secondary | ICD-10-CM | POA: Diagnosis not present

## 2022-10-22 DIAGNOSIS — Z8673 Personal history of transient ischemic attack (TIA), and cerebral infarction without residual deficits: Secondary | ICD-10-CM | POA: Diagnosis not present

## 2022-10-22 DIAGNOSIS — N179 Acute kidney failure, unspecified: Secondary | ICD-10-CM | POA: Diagnosis present

## 2022-10-22 DIAGNOSIS — Z7984 Long term (current) use of oral hypoglycemic drugs: Secondary | ICD-10-CM | POA: Diagnosis not present

## 2022-10-22 DIAGNOSIS — Z7902 Long term (current) use of antithrombotics/antiplatelets: Secondary | ICD-10-CM | POA: Diagnosis not present

## 2022-10-22 DIAGNOSIS — I129 Hypertensive chronic kidney disease with stage 1 through stage 4 chronic kidney disease, or unspecified chronic kidney disease: Secondary | ICD-10-CM | POA: Diagnosis not present

## 2022-10-22 DIAGNOSIS — E1169 Type 2 diabetes mellitus with other specified complication: Secondary | ICD-10-CM | POA: Diagnosis not present

## 2022-10-22 DIAGNOSIS — K2970 Gastritis, unspecified, without bleeding: Secondary | ICD-10-CM | POA: Diagnosis not present

## 2022-10-22 DIAGNOSIS — K922 Gastrointestinal hemorrhage, unspecified: Principal | ICD-10-CM | POA: Diagnosis present

## 2022-10-22 DIAGNOSIS — R197 Diarrhea, unspecified: Secondary | ICD-10-CM | POA: Diagnosis present

## 2022-10-22 DIAGNOSIS — N1832 Chronic kidney disease, stage 3b: Secondary | ICD-10-CM | POA: Diagnosis present

## 2022-10-22 DIAGNOSIS — F0390 Unspecified dementia without behavioral disturbance: Secondary | ICD-10-CM | POA: Diagnosis present

## 2022-10-22 DIAGNOSIS — K259 Gastric ulcer, unspecified as acute or chronic, without hemorrhage or perforation: Secondary | ICD-10-CM | POA: Diagnosis not present

## 2022-10-22 DIAGNOSIS — E1140 Type 2 diabetes mellitus with diabetic neuropathy, unspecified: Secondary | ICD-10-CM | POA: Diagnosis not present

## 2022-10-22 DIAGNOSIS — Z1152 Encounter for screening for COVID-19: Secondary | ICD-10-CM

## 2022-10-22 DIAGNOSIS — K921 Melena: Secondary | ICD-10-CM

## 2022-10-22 DIAGNOSIS — E11649 Type 2 diabetes mellitus with hypoglycemia without coma: Secondary | ICD-10-CM | POA: Diagnosis not present

## 2022-10-22 DIAGNOSIS — R55 Syncope and collapse: Secondary | ICD-10-CM | POA: Insufficient documentation

## 2022-10-22 DIAGNOSIS — E1122 Type 2 diabetes mellitus with diabetic chronic kidney disease: Secondary | ICD-10-CM | POA: Diagnosis present

## 2022-10-22 DIAGNOSIS — Z833 Family history of diabetes mellitus: Secondary | ICD-10-CM | POA: Diagnosis not present

## 2022-10-22 DIAGNOSIS — D638 Anemia in other chronic diseases classified elsewhere: Secondary | ICD-10-CM | POA: Diagnosis not present

## 2022-10-22 DIAGNOSIS — D631 Anemia in chronic kidney disease: Secondary | ICD-10-CM | POA: Diagnosis present

## 2022-10-22 DIAGNOSIS — E785 Hyperlipidemia, unspecified: Secondary | ICD-10-CM | POA: Diagnosis present

## 2022-10-22 DIAGNOSIS — I69354 Hemiplegia and hemiparesis following cerebral infarction affecting left non-dominant side: Secondary | ICD-10-CM

## 2022-10-22 DIAGNOSIS — E872 Acidosis, unspecified: Secondary | ICD-10-CM | POA: Diagnosis not present

## 2022-10-22 DIAGNOSIS — Z87891 Personal history of nicotine dependence: Secondary | ICD-10-CM | POA: Diagnosis not present

## 2022-10-22 DIAGNOSIS — Z79899 Other long term (current) drug therapy: Secondary | ICD-10-CM | POA: Diagnosis not present

## 2022-10-22 DIAGNOSIS — E1142 Type 2 diabetes mellitus with diabetic polyneuropathy: Secondary | ICD-10-CM | POA: Diagnosis present

## 2022-10-22 DIAGNOSIS — N183 Chronic kidney disease, stage 3 unspecified: Secondary | ICD-10-CM | POA: Diagnosis present

## 2022-10-22 DIAGNOSIS — Z7982 Long term (current) use of aspirin: Secondary | ICD-10-CM

## 2022-10-22 DIAGNOSIS — K254 Chronic or unspecified gastric ulcer with hemorrhage: Secondary | ICD-10-CM | POA: Diagnosis not present

## 2022-10-22 DIAGNOSIS — D62 Acute posthemorrhagic anemia: Secondary | ICD-10-CM | POA: Insufficient documentation

## 2022-10-22 DIAGNOSIS — I10 Essential (primary) hypertension: Secondary | ICD-10-CM | POA: Diagnosis present

## 2022-10-22 DIAGNOSIS — D500 Iron deficiency anemia secondary to blood loss (chronic): Secondary | ICD-10-CM | POA: Diagnosis not present

## 2022-10-22 DIAGNOSIS — I639 Cerebral infarction, unspecified: Secondary | ICD-10-CM

## 2022-10-22 DIAGNOSIS — D509 Iron deficiency anemia, unspecified: Secondary | ICD-10-CM | POA: Insufficient documentation

## 2022-10-22 LAB — TYPE AND SCREEN
ABO/RH(D): O POS
Antibody Screen: NEGATIVE

## 2022-10-22 LAB — COMPREHENSIVE METABOLIC PANEL
ALT: 16 U/L (ref 0–44)
AST: 33 U/L (ref 15–41)
Albumin: 3.5 g/dL (ref 3.5–5.0)
Alkaline Phosphatase: 69 U/L (ref 38–126)
Anion gap: 13 (ref 5–15)
BUN: 31 mg/dL — ABNORMAL HIGH (ref 8–23)
CO2: 21 mmol/L — ABNORMAL LOW (ref 22–32)
Calcium: 8.8 mg/dL — ABNORMAL LOW (ref 8.9–10.3)
Chloride: 107 mmol/L (ref 98–111)
Creatinine, Ser: 1.47 mg/dL — ABNORMAL HIGH (ref 0.61–1.24)
GFR, Estimated: 51 mL/min — ABNORMAL LOW (ref >60)
Glucose, Bld: 239 mg/dL — ABNORMAL HIGH (ref 70–99)
Potassium: 3.7 mmol/L (ref 3.5–5.1)
Sodium: 141 mmol/L (ref 135–145)
Total Bilirubin: 0.9 mg/dL (ref 0.3–1.2)
Total Protein: 7.3 g/dL (ref 6.5–8.1)

## 2022-10-22 LAB — CBC
HCT: 29 % — ABNORMAL LOW (ref 39.0–52.0)
Hemoglobin: 8.9 g/dL — ABNORMAL LOW (ref 13.0–17.0)
MCH: 26.6 pg (ref 26.0–34.0)
MCHC: 30.7 g/dL (ref 30.0–36.0)
MCV: 86.6 fL (ref 80.0–100.0)
Platelets: 406 10*3/uL — ABNORMAL HIGH (ref 150–400)
RBC: 3.35 MIL/uL — ABNORMAL LOW (ref 4.22–5.81)
RDW: 17.6 % — ABNORMAL HIGH (ref 11.5–15.5)
WBC: 10.3 10*3/uL (ref 4.0–10.5)
nRBC: 0 % (ref 0.0–0.2)

## 2022-10-22 LAB — CBG MONITORING, ED
Glucose-Capillary: 218 mg/dL — ABNORMAL HIGH (ref 70–99)
Glucose-Capillary: 159 mg/dL — ABNORMAL HIGH (ref 70–99)

## 2022-10-22 LAB — HEMOGLOBIN AND HEMATOCRIT, BLOOD
HCT: 27.1 % — ABNORMAL LOW (ref 39.0–52.0)
HCT: 23.9 % — ABNORMAL LOW (ref 39.0–52.0)
Hemoglobin: 8.5 g/dL — ABNORMAL LOW (ref 13.0–17.0)
Hemoglobin: 7.5 g/dL — ABNORMAL LOW (ref 13.0–17.0)

## 2022-10-22 LAB — FOLATE: Folate: 34 ng/mL (ref >5.9)

## 2022-10-22 LAB — PROTIME-INR
INR: 1 (ref 0.8–1.2)
Prothrombin Time: 13.2 seconds (ref 11.4–15.2)

## 2022-10-22 LAB — VITAMIN B12: Vitamin B-12: 2957 pg/mL — ABNORMAL HIGH (ref 180–914)

## 2022-10-22 LAB — RETICULOCYTES
Immature Retic Fract: 21.7 % — ABNORMAL HIGH (ref 2.3–15.9)
RBC.: 3.23 MIL/uL — ABNORMAL LOW (ref 4.22–5.81)
Retic Count, Absolute: 86.9 10*3/uL (ref 19.0–186.0)
Retic Ct Pct: 2.7 % (ref 0.4–3.1)

## 2022-10-22 LAB — FERRITIN: Ferritin: 291 ng/mL (ref 24–336)

## 2022-10-22 LAB — IRON AND TIBC
Iron: 32 ug/dL — ABNORMAL LOW (ref 45–182)
Saturation Ratios: 15 % — ABNORMAL LOW (ref 17.9–39.5)
TIBC: 221 ug/dL — ABNORMAL LOW (ref 250–450)
UIBC: 189 ug/dL

## 2022-10-22 LAB — APTT: aPTT: 24 seconds (ref 24–36)

## 2022-10-22 LAB — GLUCOSE, CAPILLARY: Glucose-Capillary: 79 mg/dL (ref 70–99)

## 2022-10-22 LAB — LACTIC ACID, PLASMA
Lactic Acid, Venous: 3.4 mmol/L (ref 0.5–1.9)
Lactic Acid, Venous: 1.6 mmol/L (ref 0.5–1.9)

## 2022-10-22 MED ORDER — PANTOPRAZOLE INFUSION (NEW) - SIMPLE MED
8.0000 mg/h | INTRAVENOUS | Status: AC
  Administered 2022-10-22 – 2022-10-25 (×6): 8 mg/h via INTRAVENOUS
  Filled 2022-10-22 (×6): qty 100

## 2022-10-22 MED ORDER — SODIUM CHLORIDE 0.9 % IV SOLN
INTRAVENOUS | Status: DC
  Administered 2022-10-22: 75 mL/h via INTRAVENOUS

## 2022-10-22 MED ORDER — INSULIN ASPART 100 UNIT/ML IJ SOLN
0.0000 [IU] | INTRAMUSCULAR | Status: DC
  Administered 2022-10-22: 2 [IU] via SUBCUTANEOUS
  Administered 2022-10-23: 3 [IU] via SUBCUTANEOUS
  Administered 2022-10-23: 1 [IU] via SUBCUTANEOUS
  Filled 2022-10-22 (×3): qty 1

## 2022-10-22 MED ORDER — LACTATED RINGERS IV BOLUS
500.0000 mL | Freq: Once | INTRAVENOUS | Status: AC
  Administered 2022-10-22: 500 mL via INTRAVENOUS

## 2022-10-22 MED ORDER — PANTOPRAZOLE SODIUM 40 MG IV SOLR
40.0000 mg | Freq: Two times a day (BID) | INTRAVENOUS | Status: DC
  Administered 2022-10-25 – 2022-10-26 (×2): 40 mg via INTRAVENOUS
  Filled 2022-10-22 (×2): qty 10

## 2022-10-22 MED ORDER — LACTATED RINGERS IV BOLUS
1000.0000 mL | Freq: Once | INTRAVENOUS | Status: AC
  Administered 2022-10-22: 1000 mL via INTRAVENOUS

## 2022-10-22 MED ORDER — PANTOPRAZOLE 80MG IVPB - SIMPLE MED
80.0000 mg | Freq: Once | INTRAVENOUS | Status: AC
  Administered 2022-10-22: 80 mg via INTRAVENOUS
  Filled 2022-10-22: qty 100

## 2022-10-22 NOTE — Assessment & Plan Note (Signed)
Baseline creatinine appears to be around 1.3-1.9 Creatinine 1.47 today Follow

## 2022-10-22 NOTE — H&P (Signed)
History and Physical    Patient: Mitchell Washington [id number] DOB: [birth year] DOA: 10/22/2022 DOS: the patient was seen and examined on 10/22/2022 PCP: Derinda Late, MD  Patient coming from: Home  Chief Complaint:  Chief Complaint  Patient presents with   Emesis   Diarrhea   HPI: Mitchell Washington is a 72 y.o. male with medical history significant of diabetes mellitus type 2, CKD 3B, hypertension, dementia and recurrent CVA  (noted multiple bilateral cerebellar infarcts status post balloon angioplasty for posterior circulation occlusion w/ extension of CVA 12/27) on aspirin and brillinta, presenting with upper GI bleed.  History primarily from patient's wife.  Per report, they were on the way to go to early voting when patient had an episode of generalized weakness and presyncope with associated large black bloody bowel movement.  Patient subsequently had multiple bloody bowel movements after this.  The wife denies any chest pain shortness of breath nausea with this.  No fevers or chills.  Has baseline dementia with chronic confusion.  The wife denies any prior episodes like this in the past.  Noted history of multiple recurrent CVAs.  Currently on aspirin and Brilinta.  Has not missed any doses of his medication no reported NSAID use. Presented to the ER afebrile, hemodynamically stable.  White count 10.3, hemoglobin 8.9 with a baseline hemoglobin around 11-12.  Glucose 218.  Creatinine 1.47.  Dr. Vicente Males with gastroenterology consulted for evaluation.  Started on IV PPI drip with plan for upper endoscopy once patient has been off of Brilinta for 48 hours. Review of Systems: As mentioned in the history of present illness. All other systems reviewed and are negative. Past Medical History:  Diagnosis Date   CVA (cerebral infarction)    Diabetes mellitus without complication ([place])    Gout    Hyperlipidemia    Hypertension    Memory loss    Stroke [facility])    Wears dentures    partial upper    Past Surgical History:  Procedure Laterality Date   APPENDECTOMY     CATARACT EXTRACTION W/PHACO Left 10/25/2019   Procedure: CATARACT EXTRACTION PHACO AND INTRAOCULAR LENS PLACEMENT (IOC) LEFT DIABETIC INTRAVEITREAL KENALOG INJECTION 1.93  00:22.6;  Surgeon: Eulogio Bear, MD;  Location: [place];  Service: Ophthalmology;  Laterality: Left;  Diabetic - oral meds   CATARACT EXTRACTION W/PHACO Right 11/15/2019   Procedure: CATARACT EXTRACTION PHACO AND INTRAOCULAR LENS PLACEMENT ([place]) RIGHT DIABETIC;  Surgeon: Eulogio Bear, MD;  Location: [place];  Service: Ophthalmology;  Laterality: Right;  0.92 0 ;19.7   IR ANGIO INTRA EXTRACRAN SEL COM CAROTID INNOMINATE BILAT MOD SED  07/16/2022   IR ANGIO VERTEBRAL SEL SUBCLAVIAN INNOMINATE UNI R MOD SED  07/16/2022   IR ANGIO VERTEBRAL SEL VERTEBRAL UNI L MOD SED  07/16/2022   IR ANGIO VERTEBRAL SEL VERTEBRAL UNI L MOD SED  07/22/2022   IR CT HEAD LTD  07/18/2022   IR PTA INTRACRANIAL  07/18/2022   RADIOLOGY WITH ANESTHESIA N/A 07/18/2022   Procedure: Cerebral angioplasty with possible stenting;  Surgeon: Luanne Bras, MD;  Location: [place];  Service: Radiology;  Laterality: N/A;   Social History:  reports that he has quit smoking. He has never used smokeless tobacco. He reports that he does not currently use alcohol. He reports that he does not use drugs.  No Known Allergies  Family History  Problem Relation Age of Onset   Diabetes Mother     Prior to Admission medications  Medication Sig Start Date End Date Taking? Authorizing Provider  acetaminophen (TYLENOL) 325 MG tablet Take 2 tablets (650 mg total) by mouth every 4 (four) hours as needed for mild pain (or temp > 37.5 C (99.5 F)). 08/01/22  Yes Angiulli, Lavon Paganini, PA-C  aspirin EC 81 MG tablet Take 81 mg by mouth daily.   Yes [provider]  atorvastatin (LIPITOR) 80 MG tablet Take 1 tablet (80 mg total) by mouth daily. 09/02/22  Yes Angiulli,  Lavon Paganini, PA-C  cyanocobalamin (VITAMIN B12) 1000 MCG tablet Take 1 tablet (1,000 mcg total) by mouth daily. 09/02/22  Yes Angiulli, Lavon Paganini, PA-C  donepezil (ARICEPT) 10 MG tablet Take 10 mg by mouth at bedtime. 08/27/18  Yes [provider]  glipiZIDE (GLUCOTROL XL) 2.5 MG 24 hr tablet Take 1 tablet (2.5 mg total) by mouth daily before breakfast. 09/02/22  Yes Angiulli, Lavon Paganini, PA-C  memantine (NAMENDA) 10 MG tablet Take 10 mg by mouth 2 (two) times daily. 12/20/19  Yes [provider]  ticagrelor (BRILINTA) 90 MG TABS tablet Take 1 tablet (90 mg total) by mouth 2 (two) times daily. 09/02/22  Yes Angiulli, Lavon Paganini, PA-C  traMADol (ULTRAM) 50 MG tablet Take 1 tablet (50 mg total) by mouth every 6 (six) hours as needed for moderate pain. 09/02/22  Yes Angiulli, Lavon Paganini, PA-C  l-methylfolate-B6-B12 (METANX) 3-35-2 MG TABS tablet Take 1 tablet by mouth daily. Patient not taking: Reported on 10/22/2022 09/02/22   Angiulli, Lavon Paganini, PA-C  Vitamin D, Ergocalciferol, (DRISDOL) 1.25 MG (50000 UNIT) CAPS capsule Take 1 capsule (50,000 Units total) by mouth every 7 (seven) days. Patient not taking: Reported on 10/22/2022 09/06/22   Cathlyn Parsons, PA-C    Physical Exam: Vitals:   10/22/22 0935 10/22/22 0957 10/22/22 1007  BP: 136/83 (!) 148/85   Pulse: (!) 116 (!) 101   Resp: 18 (!) 23   Temp: 97.7 F (36.5 C)    SpO2: 98% 100%   Weight:   77.1 kg  Height:   '5\' 9"'$  (1.753 m)   Physical Exam Constitutional:      General: He is not in acute distress. HENT:     Head: Normocephalic and atraumatic.     Nose: Nose normal.     Mouth/Throat:     Mouth: Mucous membranes are moist.  Eyes:     Extraocular Movements: Extraocular movements intact.     Pupils: Pupils are equal, round, and reactive to light.  Cardiovascular:     Rate and Rhythm: Normal rate and regular rhythm.  Pulmonary:     Effort: Pulmonary effort is normal.     Breath sounds: Normal breath sounds.  Abdominal:      General: Abdomen is flat. Bowel sounds are normal.  Musculoskeletal:        General: Normal range of motion.  Skin:    General: Skin is warm.  Neurological:     General: No focal deficit present.  Psychiatric:        Mood and Affect: Mood normal.     Data Reviewed:  There are no new results to review at this time. CT ANGIO HEAD W OR WO CONTRAST CLINICAL DATA:  72 year old male status post bilateral cerebellar infarcts and brainstem about a month ago. Bilateral PCA occlusions. Status post endovascular treatment of high-grade Basilar artery and vertebrobasilar stenoses.  EXAM: CT ANGIOGRAPHY HEAD  TECHNIQUE: Multidetector CT imaging of the head was performed using the standard protocol during bolus administration of  intravenous contrast. Multiplanar CT image reconstructions and MIPs were obtained to evaluate the vascular anatomy.  RADIATION DOSE REDUCTION: This exam was performed according to the departmental dose-optimization program which includes automated exposure control, adjustment of the mA and/or kV according to patient size and/or use of iterative reconstruction technique.  CONTRAST:  88m OMNIPAQUE IOHEXOL 350 MG/ML SOLN  COMPARISON:  Head CT 08/14/2022.  Brain MRI yesterday.  Post treatment intracranial MRA 07/19/2022.  FINDINGS: CT HEAD  Brain: Stable non contrast CT appearance of the brain. No acute intracranial hemorrhage identified. No midline shift, mass effect, or evidence of intracranial mass lesion. Chronic cerebellar, brainstem, left greater than right PCA, and left MCA territory infarcts.  Calvarium and skull base: No acute osseous abnormality identified.  Paranasal sinuses: Stable paranasal sinuses. Tympanic cavities and mastoids remain clear.  Orbits: No acute orbit or scalp soft tissue finding.  CTA HEAD  Posterior circulation: Dominant left vertebral V4 segment. Severe distal vertebral artery atherosclerosis and tandem  stenoses. Functionally occluded distal right vertebral at the vertebrobasilar junction, unchanged. Enhancement of the distal left vertebral artery is mildly improved compared to 07/15/2022. Left PICA origin remains patent.  Severe proximal basilar artery stenosis persists (series 11 image 132 and series 13, image 22 today. This appears slightly improved since prior CTA. Basilar tip and SCA origins remain patent. Highly irregular PCA origins are patent, with improved bilateral PCA enhancement especially on the right. Left PCA P3 branches remain attenuated. Posterior communicating arteries are diminutive or absent.  Anterior circulation: Both ICA siphons are patent. On the right moderate calcified plaque, and moderate to severe short segment supraclinoid stenosis (series 16, image 97) appears stable. Right ICA terminus remains patent. On the left similar bulky supraclinoid plaque and at least moderate supraclinoid stenosis (series 16, image 115) appears stable. Left ICA terminus remains patent.  MCA and ACA origins remain patent. Severe left A1 stenosis appears stable. Anterior communicating artery and bilateral ACA branches remain within normal limits. Left MCA M1 segment is patent with mild irregularity at the bifurcation. Left MCA branches are stable and within normal limits. Right MCA M1 segment bifurcates early without stenosis. Right MCA branches are stable with mild posterior M2 segment irregularity.  Venous sinuses: Early contrast timing, superior sagittal sinus is patent.  Anatomic variants: Dominant left vertebral artery.  Review of the MIP images confirms the above findings  IMPRESSION: 1. 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 Mildly improved appearance of the pronounced distal Left Vertebral Artery and mid Basilar artery stenoses since 07/15/2022, but hemodynamically significant narrowing remains. Mildly improved appearance also  bilateral PCA enhancement, distal left PCA branches remain occluded.  2. Superimposed significant bilateral supraclinoid ICA atherosclerotic stenoses also re-demonstrated and stable. Anterior circulation remains patent. Stable moderate to severe Left A1 stenosis.  3. Stable CT appearance of the brain, no new intracranial abnormality.  Electronically Signed   By: HGenevie AnnM.D.   On: 08/17/2022 12:40   Lab Results  Component Value Date   WBC 10.3 10/22/2022   HGB 8.9 (L) 10/22/2022   HCT 29.0 (L) 10/22/2022   MCV 86.6 10/22/2022   PLT 406 (H) [id number]  Last metabolic panel Lab Results  Component Value Date   GLUCOSE 239 (H) 10/22/2022   NA 141 10/22/2022   K 3.7 10/22/2022   CL 107 10/22/2022   CO2 21 (L) 10/22/2022   BUN 31 (H) 10/22/2022   CREATININE 1.47 (H) 10/22/2022   GFRNONAA 51 (L) 10/22/2022   CALCIUM 8.8 (L)  10/22/2022   PROT 7.3 10/22/2022   ALBUMIN 3.5 10/22/2022   BILITOT 0.9 10/22/2022   ALKPHOS 69 10/22/2022   AST 33 10/22/2022   ALT 16 10/22/2022   ANIONGAP 13 10/22/2022    Assessment and Plan: * UGIB (upper gastrointestinal bleed) Patient with noted large black diarrheal bowel movements with associated generalized weakness and malaise Noted aspirin and Brilinta use in setting of recurrent CVAs Acute decompensation hemoglobin with baseline hemoglobin around 12-8.9 today. Case discussed with on-call gastroenterologist Dr. Vicente Males with major concern for upper GI bleed IV PPI started in the ER Will continue Will keep n.p.o. for tentative endoscopy when greater than 48 hours off of Brilinta If symptoms worsen, consider CT angio versus urgent/emergent EGD Serial hemoglobins Appreciate gastroenterology recommendations Follow  History of multiple strokes Recent admission December 27 through January fourth for extension of her recent stroke 1 month prior to admission: MRI brain showed extension of old CVA.  History of multiple strokes, bilateral  cerebellar infarcts on 07/16/2022 s/p balloon angioplasty for posterior circulation occlusion Currently taking aspirin and Brilinta Will hold in the setting of upper GI bleed Follow  Dementia without behavioral disturbance ([place]) Per the wife, patient minimally verbal at baseline with mild confusion Appears to be at his baseline at present On Aricept and Namenda Follow  Primary hypertension BP stable Titrate home regimen.  Acute blood loss anemia Hemoglobin 8.9 today with a baseline hemoglobin around 12 in the setting of upper GI bleed Will trend hemoglobin Transfuse for hemoglobin less than 7 Check anemia panel Follow  CKD (chronic kidney disease) stage 3, GFR 30-59 ml/min (HCC) Baseline creatinine appears to be around 1.3-1.9 Creatinine 1.47 today Follow      Advance Care Planning:   Code Status: Full Code   Consults: Gastroenterology- Vicente Males   Family Communication: Wife at the bedside   Severity of Illness: The appropriate patient status for this patient is INPATIENT. Inpatient status is judged to be reasonable and necessary in order to provide the required intensity of service to ensure the patient's safety. The patient's presenting symptoms, physical exam findings, and initial radiographic and laboratory data in the context of their chronic comorbidities is felt to place them at high risk for further clinical deterioration. Furthermore, it is not anticipated that the patient will be medically stable for discharge from the hospital within 2 midnights of admission.   * I certify that at the point of admission it is my clinical judgment that the patient will require inpatient hospital care spanning beyond 2 midnights from the point of admission due to high intensity of service, high risk for further deterioration and high frequency of surveillance required.*  Author: Deneise Lever, MD 10/22/2022 12:44 PM  For on call review [email or web address].

## 2022-10-22 NOTE — Assessment & Plan Note (Signed)
Sliding-scale insulin

## 2022-10-22 NOTE — Assessment & Plan Note (Signed)
Hemoglobin 8.9 today with a baseline hemoglobin around 12 in the setting of upper GI bleed Will trend hemoglobin Transfuse for hemoglobin less than 7 Check anemia panel Follow

## 2022-10-22 NOTE — Assessment & Plan Note (Addendum)
Patient with noted large black diarrheal bowel movements with associated generalized weakness and malaise Noted aspirin and Brilinta use in setting of recurrent CVAs Acute decompensation hemoglobin with baseline hemoglobin around 12-8.9 today. Case discussed with on-call gastroenterologist Dr. Vicente Males with major concern for upper GI bleed IV PPI started in the ER Will continue Will keep n.p.o. for tentative endoscopy when greater than 48 hours off of Brilinta If symptoms worsen, consider CT angio versus urgent/emergent EGD Serial hemoglobins Appreciate gastroenterology recommendations Follow

## 2022-10-22 NOTE — Assessment & Plan Note (Addendum)
Per the wife, patient minimally verbal at baseline with mild confusion Appears to be at his baseline at present On Aricept and Namenda Follow

## 2022-10-22 NOTE — Assessment & Plan Note (Deleted)
Baseline creatinine appears to be around 1.3-1.9 Creatinine 1.47 today Follow

## 2022-10-22 NOTE — ED Notes (Signed)
IV RN at bedside 

## 2022-10-22 NOTE — ED Notes (Signed)
Pt family is helping pt clean up at this time. Pt had BM.

## 2022-10-22 NOTE — Assessment & Plan Note (Signed)
BP stable Titrate home regimen 

## 2022-10-22 NOTE — Assessment & Plan Note (Addendum)
Recent admission December 27 through January fourth for extension of her recent stroke 1 month prior to admission: MRI brain showed extension of old CVA.  History of multiple strokes, bilateral cerebellar infarcts on 07/16/2022 s/p balloon angioplasty for posterior circulation occlusion Currently taking aspirin and Brilinta Will hold in the setting of upper GI bleed Follow

## 2022-10-22 NOTE — ED Notes (Signed)
Unable to obtain new access. RN placed IV team consult.
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
## 2022-10-22 NOTE — ED Notes (Signed)
IV infiltrated and removed. RN attempted x2 for new access. Another RN attempting for IV access at this time.

## 2022-10-22 NOTE — Consult Note (Signed)
Mitchell Washington , MD [address], [place], [place], [place], [address] [id number], [place], [place], [place], [address] Phone: [phone]  Fax: [phone]  Consultation  Referring Provider:   Dr Starleen Blue Primary Care Physician:  Derinda Late, MD Primary Gastroenterologist:  None          Reason for Consultation:     GI bleed  Date of Admission:  10/22/2022 Date of Consultation:  10/22/2022         HPI:   Mitchell Washington is a 72 y.o. male with a history of right pontine CVA, dementia, diabetes, CKD presents with diarrhea and syncope to the emergency room.  Apparently was walking down the ramp when he fell and looked wobbly and then he got into the car fell backwards eyes rolled back and he had some shaking.  Since then has had several episodes of black diarrhea.   Baseline hemoglobin 1 month back was 12.4 g on admission is 8.9 g CMP creatinine is at baseline.  Elevation in the BUN at 31 but has been similar in the past.  INR is 1.0.  When I went to see him in the emergency room he appeared comfortable not in any pain or distress he had a friend by his side.  The patient denied any blood loss anywhere.  Denies any abdominal pain.  Was unaware when he took his last dose of blood thinner. Past Medical History:  Diagnosis Date   CVA (cerebral infarction)    Diabetes mellitus without complication ([place])    Gout    Hyperlipidemia    Hypertension    Memory loss    Stroke [facility])    Wears dentures    partial upper    Past Surgical History:  Procedure Laterality Date   APPENDECTOMY     CATARACT EXTRACTION W/PHACO Left 10/25/2019   Procedure: CATARACT EXTRACTION PHACO AND INTRAOCULAR LENS PLACEMENT (IOC) LEFT DIABETIC INTRAVEITREAL KENALOG INJECTION 1.93  00:22.6;  Surgeon: Eulogio Bear, MD;  Location: [place];  Service: Ophthalmology;  Laterality: Left;  Diabetic - oral meds   CATARACT EXTRACTION W/PHACO Right 11/15/2019   Procedure: CATARACT EXTRACTION PHACO AND  INTRAOCULAR LENS PLACEMENT ([place]) RIGHT DIABETIC;  Surgeon: Eulogio Bear, MD;  Location: [place];  Service: Ophthalmology;  Laterality: Right;  0.92 0 ;19.7   IR ANGIO INTRA EXTRACRAN SEL COM CAROTID INNOMINATE BILAT MOD SED  07/16/2022   IR ANGIO VERTEBRAL SEL SUBCLAVIAN INNOMINATE UNI R MOD SED  07/16/2022   IR ANGIO VERTEBRAL SEL VERTEBRAL UNI L MOD SED  07/16/2022   IR ANGIO VERTEBRAL SEL VERTEBRAL UNI L MOD SED  07/22/2022   IR CT HEAD LTD  07/18/2022   IR PTA INTRACRANIAL  07/18/2022   RADIOLOGY WITH ANESTHESIA N/A 07/18/2022   Procedure: Cerebral angioplasty with possible stenting;  Surgeon: Luanne Bras, MD;  Location: [place];  Service: Radiology;  Laterality: N/A;    Prior to Admission medications   Medication Sig Start Date End Date Taking? Authorizing Provider  acetaminophen (TYLENOL) 325 MG tablet Take 2 tablets (650 mg total) by mouth every 4 (four) hours as needed for mild pain (or temp > 37.5 C (99.5 F)). 08/01/22   Angiulli, Lavon Paganini, PA-C  aspirin EC 81 MG tablet Take 81 mg by mouth daily.    [provider]  atorvastatin (LIPITOR) 80 MG tablet Take 1 tablet (80 mg total) by mouth daily. Patient not taking: Reported on 10/17/2022 09/02/22   Lauraine Rinne  J, PA-C  cyanocobalamin (VITAMIN B12) 1000 MCG tablet Take 1 tablet (1,000 mcg total) by mouth daily. 09/02/22   Angiulli, Lavon Paganini, PA-C  donepezil (ARICEPT) 10 MG tablet Take 10 mg by mouth at bedtime. 08/27/18   [provider]  glipiZIDE (GLUCOTROL XL) 2.5 MG 24 hr tablet Take 1 tablet (2.5 mg total) by mouth daily before breakfast. 09/02/22   Angiulli, Lavon Paganini, PA-C  l-methylfolate-B6-B12 (METANX) 3-35-2 MG TABS tablet Take 1 tablet by mouth daily. 09/02/22   Angiulli, Lavon Paganini, PA-C  memantine (NAMENDA) 10 MG tablet Take 10 mg by mouth 2 (two) times daily. 12/20/19   [provider]  ticagrelor (BRILINTA) 90 MG TABS tablet Take 1 tablet (90 mg total) by mouth 2 (two) times  daily. 09/02/22   Angiulli, Lavon Paganini, PA-C  traMADol (ULTRAM) 50 MG tablet Take 1 tablet (50 mg total) by mouth every 6 (six) hours as needed for moderate pain. 09/02/22   Angiulli, Lavon Paganini, PA-C  Vitamin D, Ergocalciferol, (DRISDOL) 1.25 MG (50000 UNIT) CAPS capsule Take 1 capsule (50,000 Units total) by mouth every 7 (seven) days. 09/06/22   Angiulli, Lavon Paganini, PA-C    Family History  Problem Relation Age of Onset   Diabetes Mother      Social History   Tobacco Use   Smoking status: Former   Smokeless tobacco: Never  Vaping Use   Vaping Use: Never used  Substance Use Topics   Alcohol use: Not Currently    Alcohol/week: 0.0 standard drinks of alcohol   Drug use: Never    Allergies as of 10/22/2022   (No Known Allergies)    Review of Systems:    Patient unable to clearly give any useful history but does not complain of any issues at this time   Physical Exam:  Vital signs in last 24 hours: Temp:  [97.7 F (36.5 C)] 97.7 F (36.5 C) (03/05 0935) Pulse Rate:  [101-116] 101 (03/05 0957) Resp:  [18-23] 23 (03/05 0957) BP: (136-148)/(83-85) 148/85 (03/05 0957) SpO2:  [98 %-100 %] 100 % (03/05 0957) Weight:  [77.1 kg] 77.1 kg (03/05 1007)   General:   Pleasant, cooperative in NAD Head:  Normocephalic and atraumatic. Eyes:   No icterus.   Conjunctiva pink. PERRLA. Ears:  Normal auditory acuity. Neck:  Supple; no masses or thyroidomegaly Lungs: Respirations even and unlabored. Lungs clear to auscultation bilaterally.   No wheezes, crackles, or rhonchi.  Heart:  Regular rate and rhythm;  Without murmur, clicks, rubs or gallops Abdomen:  Soft, nondistended, nontender. Normal bowel sounds. No appreciable masses or hepatomegaly.  No rebound or guarding.  Neurologic:  Alert and oriented x1;   Skin:  Intact without significant lesions or rashes. Cervical Nodes:  No significant cervical adenopathy. Psych:  Alert and cooperative.   LAB RESULTS: Recent Labs    10/22/22 1013   WBC 10.3  HGB 8.9*  HCT 29.0*  PLT 406*   BMET Recent Labs    10/22/22 1013  NA 141  K 3.7  CL 107  CO2 21*  GLUCOSE 239*  BUN 31*  CREATININE 1.47*  CALCIUM 8.8*   LFT Recent Labs    10/22/22 1013  PROT 7.3  ALBUMIN 3.5  AST 33  ALT 16  ALKPHOS 69  BILITOT 0.9   PT/INR Recent Labs    10/22/22 1013  LABPROT 13.2  INR 1.0    STUDIES: No results found.    Impression / Plan:   Mitchell Washington is  a 72 y.o. y/o male presents to the emergency room with syncope and melena.  He is on Brilinta.  He has a drop in hemoglobin about 3 to 4 g from baseline no significant elevation in BUN/creatinine ratio.  Limited history from patient   Plan 1.Monitor CBC transfuse as needed. 2.  IV PPI 3.  We can plan for EGD when off from [place]  after at least 2 days, which will likely be Friday,  in the interim if he has a severe bleed consider CT angiogram or we need to perform urgent EGD despite being on the blood thinner insert.  Thank you for involving me in the care of this patient.      LOS: 0 days   Mitchell Bellows, MD  10/22/2022, 11:41 AM

## 2022-10-22 NOTE — ED Notes (Signed)
Pt transferred from Main ED.  This RN and NT cleaned pt stool acute bloody diarrhea and Pt limited mobility. Awaiting IV team to continue sched meds.

## 2022-10-23 DIAGNOSIS — K922 Gastrointestinal hemorrhage, unspecified: Secondary | ICD-10-CM | POA: Diagnosis not present

## 2022-10-23 DIAGNOSIS — R55 Syncope and collapse: Secondary | ICD-10-CM | POA: Insufficient documentation

## 2022-10-23 DIAGNOSIS — D500 Iron deficiency anemia secondary to blood loss (chronic): Secondary | ICD-10-CM

## 2022-10-23 DIAGNOSIS — Z8673 Personal history of transient ischemic attack (TIA), and cerebral infarction without residual deficits: Secondary | ICD-10-CM

## 2022-10-23 DIAGNOSIS — E162 Hypoglycemia, unspecified: Secondary | ICD-10-CM

## 2022-10-23 DIAGNOSIS — D509 Iron deficiency anemia, unspecified: Secondary | ICD-10-CM | POA: Insufficient documentation

## 2022-10-23 LAB — CBC
HCT: 25.2 % — ABNORMAL LOW (ref 39.0–52.0)
Hemoglobin: 7.9 g/dL — ABNORMAL LOW (ref 13.0–17.0)
MCH: 26.9 pg (ref 26.0–34.0)
MCHC: 31.3 g/dL (ref 30.0–36.0)
MCV: 85.7 fL (ref 80.0–100.0)
Platelets: 318 10*3/uL (ref 150–400)
RBC: 2.94 MIL/uL — ABNORMAL LOW (ref 4.22–5.81)
RDW: 17.7 % — ABNORMAL HIGH (ref 11.5–15.5)
WBC: 5.7 10*3/uL (ref 4.0–10.5)
nRBC: 0 % (ref 0.0–0.2)

## 2022-10-23 LAB — COMPREHENSIVE METABOLIC PANEL
ALT: 13 U/L (ref 0–44)
AST: 22 U/L (ref 15–41)
Albumin: 3 g/dL — ABNORMAL LOW (ref 3.5–5.0)
Alkaline Phosphatase: 55 U/L (ref 38–126)
Anion gap: 10 (ref 5–15)
BUN: 23 mg/dL (ref 8–23)
CO2: 22 mmol/L (ref 22–32)
Calcium: 8.6 mg/dL — ABNORMAL LOW (ref 8.9–10.3)
Chloride: 113 mmol/L — ABNORMAL HIGH (ref 98–111)
Creatinine, Ser: 1.17 mg/dL (ref 0.61–1.24)
GFR, Estimated: >60 mL/min (ref >60)
Glucose, Bld: 79 mg/dL (ref 70–99)
Potassium: 3.7 mmol/L (ref 3.5–5.1)
Sodium: 145 mmol/L (ref 135–145)
Total Bilirubin: 1 mg/dL (ref 0.3–1.2)
Total Protein: 6.1 g/dL — ABNORMAL LOW (ref 6.5–8.1)

## 2022-10-23 LAB — HEMOGLOBIN AND HEMATOCRIT, BLOOD
HCT: 24.8 % — ABNORMAL LOW (ref 39.0–52.0)
HCT: 25.6 % — ABNORMAL LOW (ref 39.0–52.0)
Hemoglobin: 7.9 g/dL — ABNORMAL LOW (ref 13.0–17.0)
Hemoglobin: 8 g/dL — ABNORMAL LOW (ref 13.0–17.0)

## 2022-10-23 LAB — GLUCOSE, CAPILLARY
Glucose-Capillary: 110 mg/dL — ABNORMAL HIGH (ref 70–99)
Glucose-Capillary: 137 mg/dL — ABNORMAL HIGH (ref 70–99)
Glucose-Capillary: 213 mg/dL — ABNORMAL HIGH (ref 70–99)
Glucose-Capillary: 50 mg/dL — ABNORMAL LOW (ref 70–99)
Glucose-Capillary: 61 mg/dL — ABNORMAL LOW (ref 70–99)
Glucose-Capillary: 77 mg/dL (ref 70–99)
Glucose-Capillary: 79 mg/dL (ref 70–99)

## 2022-10-23 MED ORDER — ATORVASTATIN CALCIUM 20 MG PO TABS
80.0000 mg | ORAL_TABLET | Freq: Every day | ORAL | Status: DC
  Administered 2022-10-23 – 2022-10-26 (×4): 80 mg via ORAL
  Filled 2022-10-23 (×4): qty 4

## 2022-10-23 MED ORDER — CARVEDILOL 3.125 MG PO TABS
3.1250 mg | ORAL_TABLET | Freq: Two times a day (BID) | ORAL | Status: DC
  Administered 2022-10-23 – 2022-10-26 (×7): 3.125 mg via ORAL
  Filled 2022-10-23 (×7): qty 1

## 2022-10-23 MED ORDER — TRAMADOL HCL 50 MG PO TABS: 50.0000 mg | ORAL_TABLET | Freq: Four times a day (QID) | ORAL | Status: DC | PRN

## 2022-10-23 MED ORDER — MEMANTINE HCL 5 MG PO TABS
10.0000 mg | ORAL_TABLET | Freq: Two times a day (BID) | ORAL | Status: DC
  Administered 2022-10-23 – 2022-10-26 (×7): 10 mg via ORAL
  Filled 2022-10-23 (×7): qty 2

## 2022-10-23 MED ORDER — SODIUM CHLORIDE 0.9 % IV SOLN
300.0000 mg | Freq: Once | INTRAVENOUS | Status: AC
  Administered 2022-10-23: 300 mg via INTRAVENOUS
  Filled 2022-10-23: qty 300

## 2022-10-23 MED ORDER — DONEPEZIL HCL 5 MG PO TABS
10.0000 mg | ORAL_TABLET | Freq: Every day | ORAL | Status: DC
  Administered 2022-10-23 – 2022-10-25 (×3): 10 mg via ORAL
  Filled 2022-10-23 (×3): qty 2

## 2022-10-23 NOTE — Hospital Course (Addendum)
Mitchell Washington is a 72 y.o. male with medical history significant of diabetes mellitus type 2, CKD 3B, hypertension, dementia and recurrent CVA  (noted multiple bilateral cerebellar infarcts status post balloon angioplasty for posterior circulation occlusion w/ extension of CVA 12/27) on aspirin and brillinta, presenting with upper GI bleed.  He also had a presyncope episode associated with black stool.  Upon arriving the hospital, he was placed on PPI drip after seen by GI.  EGD was performed on 3/8, did not show significant GI bleed.  Colonoscopy scheduled on 3/9.

## 2022-10-23 NOTE — Progress Notes (Signed)
Progress Note   Patient: Mitchell Washington [id number] DOB: 16-Dec-1950 DOA: 10/22/2022     1 DOS: the patient was seen and examined on 10/23/2022   Brief hospital course: Mitchell Washington is a 72 y.o. male with medical history significant of diabetes mellitus type 2, CKD 3B, hypertension, dementia and recurrent CVA  (noted multiple bilateral cerebellar infarcts status post balloon angioplasty for posterior circulation occlusion w/ extension of CVA 12/27) on aspirin and brillinta, presenting with upper GI bleed.  He also had a presyncope episode associated with black stool.  Upon arriving the hospital, he was placed on PPI drip after seen by GI.  EGD scheduled on Friday.    Principal Problem:   UGIB (upper gastrointestinal bleed) Active Problems:   History of multiple strokes   Dementia without behavioral disturbance ([name])   Primary hypertension   Type 2 diabetes mellitus with peripheral neuropathy (HCC)   Chronic kidney disease, stage 3b (HCC)   Acute blood loss anemia   Iron deficiency anemia   Assessment and Plan: * UGIB (upper gastrointestinal bleed) Acute blood loss anemia. Iron deficient anemia. Near syncope secondary to GI bleed. Patient has been seen by GI, scheduled for EGD on Friday due to recent dose of aspirin and Brilinta. Continue PPI. Patient has not had any bowel movement since last night, will start liquid diet.  Probably advance to heart healthy diet if no additional bleeding. Patient also has some iron deficiency, given concurrent bleeding, will give IV iron.  B12 level elevated.  History of multiple strokes History of multiple strokes, bilateral cerebellar infarcts on 07/16/2022 s/p balloon angioplasty for posterior circulation occlusion Currently taking aspirin and Brilinta Will hold in the setting of upper GI bleed   Dementia without behavioral disturbance (HCC) On Aricept and Namenda Follow  Primary hypertension BP stable  Type 2 diabetes mellitus with  peripheral neuropathy (HCC) Hypoglycemia. Patient had hypoglycemia due to n.p.o. status, diet started.  Continue to follow.  Chronic kidney disease, stage 3b ([place]) Renal function stable.       Subjective:  Patient has baseline confusion.  Patient has not had a bowel movement since last night.  No nausea vomiting.  Physical Exam: Vitals:   10/22/22 1436 10/22/22 2017 10/23/22 0414 10/23/22 0727  BP: (!) 143/85 (!) 159/76 (!) 150/75 (!) 150/63  Pulse: 93 79 81 75  Resp: '16 18 18 16  '$ Temp: 98.4 F (36.9 C) 98.1 F (36.7 C) 98 F (36.7 C) 98 F (36.7 C)  TempSrc: Oral     SpO2: 100% 100% 100% 100%  Weight:      Height:       General exam: Appears calm and comfortable  Respiratory system: Clear to auscultation. Respiratory effort normal. Cardiovascular system: S1 & S2 heard, RRR. No JVD, murmurs, rubs, gallops or clicks. No pedal edema. Gastrointestinal system: Abdomen is nondistended, soft and nontender. No organomegaly or masses felt. Normal bowel sounds heard. Central nervous system: Alert and oriented x2. No focal neurological deficits. Extremities: Symmetric 5 x 5 power. Skin: No rashes, lesions or ulcers Psychiatry: Judgement and insight appear normal. Mood & affect appropriate.    Data Reviewed:  Reviewed all lab results.  Reviewed prior lab results.  Family Communication: Family meeting with wife, 2 daughters.  All questions answered.  Disposition: Status is: Inpatient Remains inpatient appropriate because: Severity of disease, IV treatment.     Time spent: 50 minutes  Author: Sharen Hones, MD 10/23/2022 12:31 PM  For on call review [email or web address].

## 2022-10-24 ENCOUNTER — Encounter: Payer: Self-pay | Admitting: Family Medicine

## 2022-10-24 ENCOUNTER — Inpatient Hospital Stay: Payer: BLUE CROSS/BLUE SHIELD | Admitting: Neurology

## 2022-10-24 DIAGNOSIS — F0390 Unspecified dementia without behavioral disturbance: Secondary | ICD-10-CM | POA: Diagnosis not present

## 2022-10-24 DIAGNOSIS — K922 Gastrointestinal hemorrhage, unspecified: Secondary | ICD-10-CM | POA: Diagnosis not present

## 2022-10-24 LAB — URINALYSIS, ROUTINE W REFLEX MICROSCOPIC
Bacteria, UA: NONE SEEN
Bilirubin Urine: NEGATIVE
Glucose, UA: NEGATIVE mg/dL
Hgb urine dipstick: NEGATIVE
Ketones, ur: 5 mg/dL — AB
Nitrite: NEGATIVE
Protein, ur: NEGATIVE mg/dL
Specific Gravity, Urine: 1.017 (ref 1.005–1.030)
Squamous Epithelial / HPF: NONE SEEN /HPF (ref 0–5)
pH: 5 (ref 5.0–8.0)

## 2022-10-24 LAB — HEMOGLOBIN AND HEMATOCRIT, BLOOD
HCT: 27.1 % — ABNORMAL LOW (ref 39.0–52.0)
HCT: 25.2 % — ABNORMAL LOW (ref 39.0–52.0)
Hemoglobin: 8.6 g/dL — ABNORMAL LOW (ref 13.0–17.0)
Hemoglobin: 7.9 g/dL — ABNORMAL LOW (ref 13.0–17.0)

## 2022-10-24 LAB — GLUCOSE, CAPILLARY
Glucose-Capillary: 74 mg/dL (ref 70–99)
Glucose-Capillary: 80 mg/dL (ref 70–99)
Glucose-Capillary: 89 mg/dL (ref 70–99)
Glucose-Capillary: 135 mg/dL — ABNORMAL HIGH (ref 70–99)

## 2022-10-24 NOTE — Progress Notes (Signed)
  Progress Note   Patient: Mitchell Washington [id number] DOB: 01-30-1951 DOA: 10/22/2022     2 DOS: the patient was seen and examined on 10/24/2022   Brief hospital course: Mitchell Washington is a 72 y.o. male with medical history significant of diabetes mellitus type 2, CKD 3B, hypertension, dementia and recurrent CVA  (noted multiple bilateral cerebellar infarcts status post balloon angioplasty for posterior circulation occlusion w/ extension of CVA 12/27) on aspirin and brillinta, presenting with upper GI bleed.  He also had a presyncope episode associated with black stool.  Upon arriving the hospital, he was placed on PPI drip after seen by GI.  EGD scheduled on Friday.    Principal Problem:   UGIB (upper gastrointestinal bleed) Active Problems:   History of multiple strokes   Dementia without behavioral disturbance ([place])   Primary hypertension   Type 2 diabetes mellitus with peripheral neuropathy (HCC)   Chronic kidney disease, stage 3b (HCC)   Acute blood loss anemia   Iron deficiency anemia   Near syncope   Hypoglycemia   Assessment and Plan:  * UGIB (upper gastrointestinal bleed) Acute blood loss anemia. Iron deficient anemia. Near syncope secondary to GI bleed. Patient has been seen by GI, scheduled for EGD on Friday due to recent dose of aspirin and Brilinta. Continue PPI. Patient also received IV iron for iron deficient anemia.  Patient had 1 large black stool last night, continue liquid diet.  EGD scheduled for tomorrow. Continue to follow hemoglobin, transfuse as needed.   History of multiple strokes History of multiple strokes, bilateral cerebellar infarcts on 07/16/2022 s/p balloon angioplasty for posterior circulation occlusion Currently taking aspirin and Brilinta, will hold in the setting of upper GI bleed     Dementia without behavioral disturbance ([place]) Continue home medicines.   Primary hypertension BP stable   Type 2 diabetes mellitus with peripheral  neuropathy (HCC) Hypoglycemia. Discontinued all insulin due to hypoglycemia.  Patient was taking in liquid diet.  Continue to follow.  Chronic kidney disease, stage 3b ([place]) Renal function stable.     Subjective:  Patient has baseline confusion, otherwise no complaints. Relative large black stool last night.  Physical Exam: Vitals:   10/23/22 1553 10/23/22 2001 10/24/22 0432 10/24/22 0736  BP: (!) 148/54 (!) 157/57 (!) 154/64 (!) 139/55  Pulse: 71 (!) 57 66 70  Resp: '17 20 19 18  '$ Temp: 98.4 F (36.9 C) 98.2 F (36.8 C) 97.7 F (36.5 C) 98.3 F (36.8 C)  TempSrc:  Oral    SpO2: 100% 100% 100% 100%  Weight:      Height:       General exam: Appears calm and comfortable  Respiratory system: Clear to auscultation. Respiratory effort normal. Cardiovascular system: S1 & S2 heard, RRR. No JVD, murmurs, rubs, gallops or clicks. No pedal edema. Gastrointestinal system: Abdomen is nondistended, soft and nontender. No organomegaly or masses felt. Normal bowel sounds heard. Central nervous system: Alert and oriented x2. No focal neurological deficits. Extremities: Symmetric 5 x 5 power. Skin: No rashes, lesions or ulcers Psychiatry: Judgement and insight appear normal. Mood & affect appropriate.    Data Reviewed:  Lab results reviewed.  Family Communication: Wife updated at bedside  Disposition: Status is: Inpatient Remains inpatient appropriate because: Severity of disease, IV treatment, pending inpatient procedure.     Time spent: 35 minutes  Author: Sharen Hones, MD 10/24/2022 1:06 PM  For on call review [email or web address].

## 2022-10-24 NOTE — Consult Note (Signed)
   [facility] - Inpatient CM Inpatient Consult   10/24/2022  Mitchell Washington 1951-03-02 [id number]   Orientation with Mitchell Washington, [facility] Liaison for review.   Location: [place] [facility] Liaison and spoke with the spouse Mitchell Washington) remotely via pt at [facility].   [facility]) [place] Patient: Insurance [facility]).    Primary Care Provider: Derinda Late, MD Mitchell Washington   Patient screened for less than 30 days readmission hospitalization with noted Medium risk score, 5 IP and 1 ED in 6 months for potential [facility]) Care Management service needs for post hospital transition for care coordination.   [facility] hospital liaison spoke with the spouse Mitchell Washington) due to patient's history of Dementia. She reports pt was mobile around at home prior to this admission. She is requesting a hospital bed if pt returns home to assist with getting the patient out of the bed. She also is requested [facility] to resume therapy services in the home. Again if patient returns home.      Plan:  HIPAA verified and [facility] RN will continue to follow ongoing disposition to assess for post hospital community care coordination/management needs.  [facility] hospital liaison will communicate with [facility] social worker with the above request for ongoing discharge planning needs.   [place] does not replace or interfere with any arrangements made by the Inpatient Transition of Care team.   For questions contact:   Mitchell Mina, RN, [place] Hours M-F 8:00 am to 4:30 pm [phone] [Office toll free line][facility] Office Hours are M-F 8:30 - 5 pm 24 hour nurse advise line [phone] Conceirge  [email or web address]

## 2022-10-24 NOTE — TOC CM/SW Note (Signed)
  Transition of Care (TOC) Screening Note   Patient Details  Name: Mitchell Washington Date of Birth: 1950/11/25   Transition of Care [facility]) CM/SW Contact:    Gerilyn Pilgrim, LCSW Phone Number: 10/24/2022, 10:01 AM    Transition of Care Department [facility]) has reviewed patient and no TOC needs have been identified at this time. We will continue to monitor patient advancement through interdisciplinary progression rounds. If new patient transition needs arise, please place a TOC consult.

## 2022-10-25 ENCOUNTER — Inpatient Hospital Stay: Payer: Medicare HMO | Admitting: Anesthesiology

## 2022-10-25 ENCOUNTER — Encounter
Admission: EM | Disposition: A | Discharge status: Home / Self Care | Payer: Self-pay | Source: Home / Self Care | Attending: Internal Medicine

## 2022-10-25 ENCOUNTER — Encounter: Payer: Self-pay | Admitting: Family Medicine

## 2022-10-25 DIAGNOSIS — K921 Melena: Secondary | ICD-10-CM | POA: Diagnosis not present

## 2022-10-25 DIAGNOSIS — D638 Anemia in other chronic diseases classified elsewhere: Secondary | ICD-10-CM

## 2022-10-25 DIAGNOSIS — D500 Iron deficiency anemia secondary to blood loss (chronic): Secondary | ICD-10-CM | POA: Diagnosis not present

## 2022-10-25 DIAGNOSIS — K922 Gastrointestinal hemorrhage, unspecified: Secondary | ICD-10-CM | POA: Diagnosis not present

## 2022-10-25 HISTORY — PX: ESOPHAGOGASTRODUODENOSCOPY (EGD) WITH PROPOFOL: SHX5813

## 2022-10-25 LAB — BASIC METABOLIC PANEL
Anion gap: 8 (ref 5–15)
BUN: 10 mg/dL (ref 8–23)
CO2: 23 mmol/L (ref 22–32)
Calcium: 8.4 mg/dL — ABNORMAL LOW (ref 8.9–10.3)
Chloride: 107 mmol/L (ref 98–111)
Creatinine, Ser: 1.01 mg/dL (ref 0.61–1.24)
GFR, Estimated: >60 mL/min (ref >60)
Glucose, Bld: 81 mg/dL (ref 70–99)
Potassium: 3.5 mmol/L (ref 3.5–5.1)
Sodium: 138 mmol/L (ref 135–145)

## 2022-10-25 LAB — GLUCOSE, CAPILLARY
Glucose-Capillary: 104 mg/dL — ABNORMAL HIGH (ref 70–99)
Glucose-Capillary: 105 mg/dL — ABNORMAL HIGH (ref 70–99)
Glucose-Capillary: 109 mg/dL — ABNORMAL HIGH (ref 70–99)
Glucose-Capillary: 114 mg/dL — ABNORMAL HIGH (ref 70–99)
Glucose-Capillary: 126 mg/dL — ABNORMAL HIGH (ref 70–99)
Glucose-Capillary: 86 mg/dL (ref 70–99)
Glucose-Capillary: 93 mg/dL (ref 70–99)

## 2022-10-25 LAB — CBC
HCT: 26.1 % — ABNORMAL LOW (ref 39.0–52.0)
Hemoglobin: 8.2 g/dL — ABNORMAL LOW (ref 13.0–17.0)
MCH: 26.8 pg (ref 26.0–34.0)
MCHC: 31.4 g/dL (ref 30.0–36.0)
MCV: 85.3 fL (ref 80.0–100.0)
Platelets: 319 10*3/uL (ref 150–400)
RBC: 3.06 MIL/uL — ABNORMAL LOW (ref 4.22–5.81)
RDW: 17.9 % — ABNORMAL HIGH (ref 11.5–15.5)
WBC: 6.7 10*3/uL (ref 4.0–10.5)
nRBC: 0 % (ref 0.0–0.2)

## 2022-10-25 LAB — MAGNESIUM: Magnesium: 1.6 mg/dL — ABNORMAL LOW (ref 1.7–2.4)

## 2022-10-25 SURGERY — ESOPHAGOGASTRODUODENOSCOPY (EGD) WITH PROPOFOL
Anesthesia: General

## 2022-10-25 MED ORDER — MAGNESIUM SULFATE 2 GM/50ML IV SOLN
2.0000 g | Freq: Once | INTRAVENOUS | Status: AC
  Administered 2022-10-25: 2 g via INTRAVENOUS
  Filled 2022-10-25: qty 50

## 2022-10-25 MED ORDER — POLYETHYLENE GLYCOL 3350 17 GM/SCOOP PO POWD
1.0000 | Freq: Once | ORAL | Status: AC
  Administered 2022-10-26: 255 g via ORAL
  Filled 2022-10-25: qty 255

## 2022-10-25 MED ORDER — LIDOCAINE HCL (CARDIAC) PF 100 MG/5ML IV SOSY
PREFILLED_SYRINGE | INTRAVENOUS | Status: DC | PRN
  Administered 2022-10-25: 100 mg via INTRAVENOUS

## 2022-10-25 MED ORDER — SODIUM CHLORIDE 0.9 % IV SOLN: INTRAVENOUS | Status: DC

## 2022-10-25 MED ORDER — SODIUM CHLORIDE 0.9 % IV SOLN: INTRAVENOUS | Status: DC | PRN

## 2022-10-25 MED ORDER — PROPOFOL 500 MG/50ML IV EMUL
INTRAVENOUS | Status: DC | PRN
  Administered 2022-10-25: 150 ug/kg/min via INTRAVENOUS

## 2022-10-25 MED ORDER — PROPOFOL 10 MG/ML IV BOLUS
INTRAVENOUS | Status: DC | PRN
  Administered 2022-10-25: 50 mg via INTRAVENOUS

## 2022-10-25 MED ORDER — POLYETHYLENE GLYCOL 3350 17 G PO PACK
34.0000 g | PACK | Freq: Once | ORAL | Status: AC
  Administered 2022-10-25: 34 g via ORAL
  Filled 2022-10-25: qty 2

## 2022-10-25 MED ORDER — GLYCOPYRROLATE 0.2 MG/ML IJ SOLN
INTRAMUSCULAR | Status: DC | PRN
  Administered 2022-10-25: .2 mg via INTRAVENOUS

## 2022-10-25 MED ORDER — PEG 3350-KCL-NA BICARB-NACL 420 G PO SOLR
4000.0000 mL | Freq: Once | ORAL | Status: AC
  Administered 2022-10-25: 4000 mL via ORAL
  Filled 2022-10-25: qty 4000

## 2022-10-25 NOTE — Care Management Important Message (Signed)
Important Message  Patient Details  Name: Mitchell Washington MRN: [id number] Date of Birth: [birth year]   Medicare Important Message Given:  Yes     Dannette Barbara 10/25/2022, 10:42 AM

## 2022-10-25 NOTE — TOC Progression Note (Signed)
Transition of Care [facility]) - Progression Note    Patient Details  Name: Mitchell Washington MRN: [id number] Date of Birth: 1950/12/26  Transition of Care [facility]) CM/SW Contact  Gerilyn Pilgrim, LCSW Phone Number: 10/25/2022, 1:07 PM  Clinical Narrative:   Adapt assessing patient to see if he qualifies for hospital bed.          Expected Discharge Plan and Services                                               Social Determinants of Health (SDOH) Interventions SDOH Screenings   Food Insecurity: Unknown (10/22/2022)  Housing: Low Risk  (10/22/2022)  Transportation Needs: Unknown (10/22/2022)  Utilities: Unknown (10/22/2022)  Tobacco Use: Medium Risk (10/25/2022)    Readmission Risk Interventions     No data to display

## 2022-10-25 NOTE — Progress Notes (Signed)
Patient had recurrent CVA which requires repositioning in ways that can't be achieved in a normal bed. Hospital bed is needed.

## 2022-10-25 NOTE — Anesthesia Preprocedure Evaluation (Signed)
Anesthesia Evaluation  Patient identified by MRN, date of birth, ID band Patient awake and Patient confused  General Assessment Comment:  Concern for UGI given melena. No upper GI symptoms like nausea, vomiting, hematemesis. NPO appropriate.  Reviewed: Allergy & Precautions, NPO status , Patient's Chart, lab work & pertinent test results  History of Anesthesia Complications Negative for: history of anesthetic complications  Airway Mallampati: Unable to assess  TM Distance: >3 FB Neck ROM: Full    Dental no notable dental hx. (+) Edentulous Upper, Edentulous Lower   Pulmonary neg pulmonary ROS, neg sleep apnea, neg COPD, Patient abstained from smoking.Not current smoker, former smoker   Pulmonary exam normal breath sounds clear to auscultation       Cardiovascular Exercise Tolerance: Poor METShypertension, (-) CAD and (-) Past MI (-) dysrhythmias  Rhythm:Regular Rate:Normal - Systolic murmurs    Neuro/Psych  PSYCHIATRIC DISORDERS     Dementia CVA    GI/Hepatic ,neg GERD  ,,(+)     (-) substance abuse    Endo/Other  diabetes    Renal/GU CRFRenal disease     Musculoskeletal   Abdominal   Peds  Hematology   Anesthesia Other Findings Past Medical History: No date: CVA (cerebral infarction) No date: Diabetes mellitus without complication (HCC) No date: Gout No date: Hyperlipidemia No date: Hypertension No date: Memory loss No date: Stroke ([place]) No date: Wears dentures     Comment:  partial upper  Reproductive/Obstetrics                             Anesthesia Physical Anesthesia Plan  ASA: 3  Anesthesia Plan: General   Post-op Pain Management: Minimal or no pain anticipated   Induction: Intravenous  PONV Risk Score and Plan: 2 and Propofol infusion, TIVA and Ondansetron  Airway Management Planned: Nasal Cannula  Additional Equipment: None  Intra-op Plan:   Post-operative  Plan:   Informed Consent: I have reviewed the patients History and Physical, chart, labs and discussed the procedure including the risks, benefits and alternatives for the proposed anesthesia with the patient or authorized representative who has indicated his/her understanding and acceptance.     Dental advisory given and Consent reviewed with POA  Plan Discussed with: CRNA and Surgeon  Anesthesia Plan Comments: (Discussed risks of anesthesia with patient's wife at his bedside, including possibility of difficulty with spontaneous ventilation under anesthesia necessitating airway intervention, PONV, and rare risks such as cardiac or respiratory or neurological events, and allergic reactions. Discussed the role of CRNA in patient's perioperative care. Wife understands.)       Anesthesia Quick Evaluation

## 2022-10-25 NOTE — Anesthesia Postprocedure Evaluation (Signed)
Anesthesia Post Note  Patient: Mitchell Washington  Procedure(s) Performed: ESOPHAGOGASTRODUODENOSCOPY (EGD) WITH PROPOFOL  Patient location during evaluation: Endoscopy Anesthesia Type: General Level of consciousness: awake and alert Pain management: pain level controlled Vital Signs Assessment: post-procedure vital signs reviewed and stable Respiratory status: spontaneous breathing, nonlabored ventilation, respiratory function stable and patient connected to nasal cannula oxygen Cardiovascular status: blood pressure returned to baseline and stable Postop Assessment: no apparent nausea or vomiting Anesthetic complications: no   No notable events documented.   Last Vitals:  Vitals:   10/25/22 1245 10/25/22 1316  BP: (!) 94/49 114/78  Pulse: (!) 103 95  Resp: 10 (!) 22  Temp: (!) 35.9 C   SpO2: 100% 100%    Last Pain:  Vitals:   10/25/22 1316  TempSrc:   PainSc: Asleep                 Arita Miss

## 2022-10-25 NOTE — Transfer of Care (Signed)
Immediate Anesthesia Transfer of Care Note  Patient: Mitchell Washington  Procedure(s) Performed: ESOPHAGOGASTRODUODENOSCOPY (EGD) WITH PROPOFOL  Patient Location: Endoscopy Unit  Anesthesia Type:General  Level of Consciousness: drowsy  Airway & Oxygen Therapy: Patient Spontanous Breathing  Post-op Assessment: Report given to RN and Post -op Vital signs reviewed and stable  Post vital signs: Reviewed and stable  Last Vitals:  Vitals Value Taken Time  BP 82/67 10/25/22 1246  Temp    Pulse 96 10/25/22 1245  Resp 22 10/25/22 1246  SpO2 100 % 10/25/22 1245  Vitals shown include unvalidated device data.  Last Pain:  Vitals:   10/25/22 0939  TempSrc:   PainSc: 0-No pain         Complications: No notable events documented.

## 2022-10-25 NOTE — Progress Notes (Signed)
Progress Note   Patient: Mitchell Washington [id number] DOB: 05/15/1951 DOA: 10/22/2022     3 DOS: the patient was seen and examined on 10/25/2022   Brief hospital course: Mitchell Washington is a 72 y.o. male with medical history significant of diabetes mellitus type 2, CKD 3B, hypertension, dementia and recurrent CVA  (noted multiple bilateral cerebellar infarcts status post balloon angioplasty for posterior circulation occlusion w/ extension of CVA 12/27) on aspirin and brillinta, presenting with upper GI bleed.  He also had a presyncope episode associated with black stool.  Upon arriving the hospital, he was placed on PPI drip after seen by GI.  EGD was performed on 3/8, did not show significant GI bleed.  Colonoscopy scheduled on 3/9.   Principal Problem:   UGIB (upper gastrointestinal bleed) Active Problems:   History of multiple strokes   Dementia without behavioral disturbance ([place])   Primary hypertension   Type 2 diabetes mellitus with peripheral neuropathy (HCC)   Chronic kidney disease, stage 3b (HCC)   Acute blood loss anemia   Iron deficiency anemia   Near syncope   Hypoglycemia   Hypomagnesemia   Melena   Anemia of chronic disease   Assessment and Plan: * UGIB (upper gastrointestinal bleed) Acute blood loss anemia. Iron deficient anemia. Near syncope secondary to GI bleed. Patient has been seen by GI, scheduled for EGD on Friday due to recent dose of aspirin and Brilinta. Patient received IV iron for iron deficient anemia.  Hemoglobin has been stable.  EGD was performed, did not show any evidence of bleeding.  Colonoscopy is scheduled tomorrow.  PPI discontinued.   History of multiple strokes History of multiple strokes, bilateral cerebellar infarcts on 07/16/2022 s/p balloon angioplasty for posterior circulation occlusion Currently taking aspirin and Brilinta, will hold in the setting of upper GI bleed     Dementia without behavioral disturbance ([place]) Continue home  medicines.   Primary hypertension BP stable   Type 2 diabetes mellitus with peripheral neuropathy (HCC) Hypoglycemia. Continue liquid diet, all insulin discontinued due to hypoglycemia.  Continue to follow.   Chronic kidney disease, stage 3b (HCC) Hypomagnesemia. Renal function stable.  Replete magnesium.        Subjective:  Patient doing well today, no complaint.  No additional black stool.  Physical Exam: Vitals:   10/24/22 2041 10/25/22 0429 10/25/22 0759 10/25/22 1245  BP: (!) 151/78 (!) 142/55 (!) 149/75 (!) 94/49  Pulse: 71 67 73 (!) 103  Resp: '18  16 10  '$ Temp: 98.4 F (36.9 C) 97.6 F (36.4 C) 97.6 F (36.4 C) (!) 96.7 F (35.9 C)  TempSrc: Oral   Temporal  SpO2: 100% 100% 100% 100%  Weight:      Height:       General exam: Appears calm and comfortable  Respiratory system: Clear to auscultation. Respiratory effort normal. Cardiovascular system: S1 & S2 heard, RRR. No JVD, murmurs, rubs, gallops or clicks. No pedal edema. Gastrointestinal system: Abdomen is nondistended, soft and nontender. No organomegaly or masses felt. Normal bowel sounds heard. Central nervous system: Alert and oriented x2. No focal neurological deficits. Extremities: Symmetric 5 x 5 power. Skin: No rashes, lesions or ulcers Psychiatry: Judgement and insight appear normal. Mood & affect appropriate.    Data Reviewed:  Lab results reviewed.  Family Communication: Wife updated at bedside.  Disposition: Status is: Inpatient Remains inpatient appropriate because: Severity of disease, inpatient procedure.     Time spent: 35 minutes  Author: Sharen Hones, MD 10/25/2022  12:56 PM  For on call review [email or web address].

## 2022-10-25 NOTE — Op Note (Signed)
[facility] Gastroenterology Patient Name: Mitchell Washington Procedure Date: 10/25/2022 12:23 PM MRN: [id number] Account #: [id number] Date of Birth: [birth year] Admit Type: Inpatient Age: 72 Room: [facility] ENDO ROOM 2 Gender: Male Note Status: Finalized Instrument Name: Upper Endoscope [id number] Procedure:             Upper GI endoscopy Indications:           Iron deficiency anemia secondary to chronic blood                         loss, Melena Providers:             Lin Landsman MD, MD Referring MD:          Caprice Renshaw MD (Referring MD) Medicines:             General Anesthesia Complications:         No immediate complications. Estimated blood loss: None. Procedure:             Pre-Anesthesia Assessment:                        - Prior to the procedure, a History and Physical was                         performed, and patient medications and allergies were                         reviewed. The patient is unable to give consent                         secondary to the patient being legally incompetent to                         consent. The risks and benefits of the procedure and                         the sedation options and risks were discussed with the                         patient's spouse. All questions were answered and                         informed consent was obtained. Patient identification                         and proposed procedure were verified by the physician,                         the nurse, the anesthesiologist, the anesthetist and                         the technician in the pre-procedure area in the                         procedure room in the endoscopy suite. Mental Status                         Examination: alert and oriented. Airway Examination:  normal oropharyngeal airway and neck mobility.                         Respiratory Examination: clear to auscultation. CV                         Examination:  normal. Prophylactic Antibiotics: The                         patient does not require prophylactic antibiotics.                         Prior Anticoagulants: The patient has taken Brilinta                         (ticagrelor), last dose was 3 days prior to procedure.                         ASA Grade Assessment: III - A patient with severe                         systemic disease. After reviewing the risks and                         benefits, the patient was deemed in satisfactory                         condition to undergo the procedure. The anesthesia                         plan was to use general anesthesia. Immediately prior                         to administration of medications, the patient was                         re-assessed for adequacy to receive sedatives. The                         heart rate, respiratory rate, oxygen saturations,                         blood pressure, adequacy of pulmonary ventilation, and                         response to care were monitored throughout the                         procedure. The physical status of the patient was                         re-assessed after the procedure.                        After obtaining informed consent, the endoscope was                         passed under direct vision. Throughout the procedure,  the patient's blood pressure, pulse, and oxygen                         saturations were monitored continuously. The Endoscope                         was introduced through the mouth, and advanced to the                         second part of duodenum. The upper GI endoscopy was                         accomplished without difficulty. The patient tolerated                         the procedure well. Findings:      The duodenal bulb, second portion of the duodenum and third portion of       the duodenum were normal.      Few non-bleeding superficial gastric ulcers with Healing ulcers were        found at the incisura and in the prepyloric region of the stomach. The       largest lesion was 5 mm in largest dimension. Biopsies were taken with a       cold forceps for Helicobacter pylori testing.      The gastric body and gastric antrum were normal. Biopsies were taken       with a cold forceps for Helicobacter pylori testing.      The cardia and gastric fundus were normal on retroflexion.      Esophagogastric landmarks were identified: the gastroesophageal junction       was found at 40 cm from the incisors.      The gastroesophageal junction and examined esophagus were normal. Impression:            - Normal duodenal bulb, second portion of the duodenum                         and third portion of the duodenum.                        - Non-bleeding gastric ulcers with Healing ulcers.                         Biopsied.                        - Normal gastric body and antrum. Biopsied.                        - Esophagogastric landmarks identified.                        - Normal gastroesophageal junction and esophagus. Recommendation:        - Clear liquid diet today.                        - Continue present medications.                        - Await pathology results.                        -  Return patient to hospital ward for ongoing care.                        - Continue present medications.                        - Perform a colonoscopy tomorrow. Procedure Code(s):     --- Professional ---                        [phone], Esophagogastroduodenoscopy, flexible,                         transoral; with biopsy, single or multiple Diagnosis Code(s):     --- Professional ---                        D50.0, Iron deficiency anemia secondary to blood loss                         (chronic)                        K92.1, Melena (includes Hematochezia) CPT copyright 2022 American Medical Association. All rights reserved. The codes documented in this report are preliminary and upon coder  review may  be revised to meet current compliance requirements. Dr. Ulyess Mort Lin Landsman MD, MD 10/25/2022 12:45:35 PM This report has been signed electronically. Number of Addenda: 0 Note Initiated On: 10/25/2022 12:23 PM Estimated Blood Loss:  Estimated blood loss: none.      [facility]

## 2022-10-26 ENCOUNTER — Encounter: Payer: Self-pay | Admitting: General Practice

## 2022-10-26 ENCOUNTER — Encounter
Admission: EM | Disposition: A | Discharge status: Home / Self Care | Payer: Self-pay | Source: Home / Self Care | Attending: Internal Medicine

## 2022-10-26 DIAGNOSIS — K922 Gastrointestinal hemorrhage, unspecified: Secondary | ICD-10-CM | POA: Diagnosis not present

## 2022-10-26 DIAGNOSIS — F0390 Unspecified dementia without behavioral disturbance: Secondary | ICD-10-CM | POA: Diagnosis not present

## 2022-10-26 DIAGNOSIS — Z8673 Personal history of transient ischemic attack (TIA), and cerebral infarction without residual deficits: Secondary | ICD-10-CM | POA: Diagnosis not present

## 2022-10-26 DIAGNOSIS — E872 Acidosis, unspecified: Secondary | ICD-10-CM | POA: Insufficient documentation

## 2022-10-26 LAB — CBC
HCT: 26.2 % — ABNORMAL LOW (ref 39.0–52.0)
Hemoglobin: 8.3 g/dL — ABNORMAL LOW (ref 13.0–17.0)
MCH: 27.3 pg (ref 26.0–34.0)
MCHC: 31.7 g/dL (ref 30.0–36.0)
MCV: 86.2 fL (ref 80.0–100.0)
Platelets: 355 10*3/uL (ref 150–400)
RBC: 3.04 MIL/uL — ABNORMAL LOW (ref 4.22–5.81)
RDW: 17.9 % — ABNORMAL HIGH (ref 11.5–15.5)
WBC: 9.1 10*3/uL (ref 4.0–10.5)
nRBC: 0 % (ref 0.0–0.2)

## 2022-10-26 LAB — MAGNESIUM: Magnesium: 1.9 mg/dL (ref 1.7–2.4)

## 2022-10-26 LAB — GLUCOSE, CAPILLARY
Glucose-Capillary: 112 mg/dL — ABNORMAL HIGH (ref 70–99)
Glucose-Capillary: 100 mg/dL — ABNORMAL HIGH (ref 70–99)

## 2022-10-26 LAB — BASIC METABOLIC PANEL
Anion gap: 10 (ref 5–15)
BUN: 11 mg/dL (ref 8–23)
CO2: 19 mmol/L — ABNORMAL LOW (ref 22–32)
Calcium: 8.5 mg/dL — ABNORMAL LOW (ref 8.9–10.3)
Chloride: 109 mmol/L (ref 98–111)
Creatinine, Ser: 1.21 mg/dL (ref 0.61–1.24)
GFR, Estimated: >60 mL/min (ref >60)
Glucose, Bld: 118 mg/dL — ABNORMAL HIGH (ref 70–99)
Potassium: 3.5 mmol/L (ref 3.5–5.1)
Sodium: 138 mmol/L (ref 135–145)

## 2022-10-26 SURGERY — COLONOSCOPY WITH PROPOFOL
Anesthesia: General

## 2022-10-26 MED ORDER — SODIUM BICARBONATE 650 MG PO TABS
650.0000 mg | ORAL_TABLET | Freq: Three times a day (TID) | ORAL | Status: DC
  Administered 2022-10-26: 650 mg via ORAL
  Filled 2022-10-26: qty 1

## 2022-10-26 MED ORDER — SODIUM BICARBONATE 650 MG PO TABS: 650.0000 mg | ORAL_TABLET | Freq: Three times a day (TID) | ORAL | 0 refills | Status: AC

## 2022-10-26 MED ORDER — POTASSIUM CHLORIDE 20 MEQ PO PACK
40.0000 meq | PACK | Freq: Once | ORAL | Status: AC
  Administered 2022-10-26: 40 meq via ORAL
  Filled 2022-10-26: qty 2

## 2022-10-26 NOTE — TOC Progression Note (Signed)
Transition of Care [facility]) - Progression Note    Patient Details  Name: Mitchell Washington MRN: [id number] Date of Birth: 1951/07/10  Transition of Care [facility]) CM/SW [place], [place] Phone Number: 10/26/2022, 9:33 AM  Clinical Narrative:     CSW spoke with Jasmine at [place] [place] in regards to the status of pt's hospital bed, Jasmine states there are no notes but requested MD put a narrative with medical necessity for pt to be reviewed, MD made aware.        Expected Discharge Plan and Services                                               Social Determinants of Health (SDOH) Interventions SDOH Screenings   Food Insecurity: Unknown (10/22/2022)  Housing: Low Risk  (10/22/2022)  Transportation Needs: Unknown (10/22/2022)  Utilities: Unknown (10/22/2022)  Tobacco Use: Medium Risk (10/25/2022)    Readmission Risk Interventions     No data to display

## 2022-10-26 NOTE — Progress Notes (Signed)
Pt discharging home, discharge reviewed with wife, states understanding, pt with no complaints

## 2022-10-26 NOTE — Progress Notes (Signed)
Patient refused to drink the NuLYTELY .  It appears he may have drank about a fourth of the [place] then per his wife he refused anymore.  Notified Dr Charlie Pitter about refusal and Miralax was ordered.  He did have a large liquid/mushy dark BM.  Dr Charlie Pitter also ordered the Miralax bowel prep which was started about 2330. Patient drank one cup and then only took sips. Refused to drink anymore.  Last BM was brown liquid only.

## 2022-10-26 NOTE — Discharge Summary (Signed)
Physician Discharge Summary   Patient: Mitchell Washington MRN: [id number] DOB: 1950-11-12  Admit date:     10/22/2022  Discharge date: 10/26/22  Discharge Physician: Sharen Hones   PCP: Derinda Late, MD   Recommendations at discharge:   Follow-up with PCP in 1 week. Refer to palliative care.  Discharge Diagnoses: Principal Problem:   UGIB (upper gastrointestinal bleed) Active Problems:   History of multiple strokes   Dementia without behavioral disturbance (HCC)   Primary hypertension   Type 2 diabetes mellitus with peripheral neuropathy (HCC)   Chronic kidney disease, stage 3b (HCC)   Acute blood loss anemia   Iron deficiency anemia   Near syncope   Hypoglycemia   Hypomagnesemia   Melena   Anemia of chronic disease   Metabolic acidosis  Resolved Problems:   * No resolved hospital problems. *  Hospital Course: Mitchell Washington is a 72 y.o. male with medical history significant of diabetes mellitus type 2, CKD 3B, hypertension, dementia and recurrent CVA  (noted multiple bilateral cerebellar infarcts status post balloon angioplasty for posterior circulation occlusion w/ extension of CVA 12/27) on aspirin and brillinta, presenting with upper GI bleed.  He also had a presyncope episode associated with black stool.  Upon arriving the hospital, he was placed on PPI drip after seen by GI.  EGD was performed on 3/8, did not show significant GI bleed.  Colonoscopy scheduled on 3/9. Spoke with wife, she does not want patient to have a colonoscopy.  Patient was assessed by PCP previously, her determined poor prognosis, was told patient has 6 months to live.  At this point, patient be referred to palliative care.  Assessment and Plan:  UGIB (upper gastrointestinal bleed) Acute blood loss anemia. Iron deficient anemia. Near syncope secondary to GI bleed. Patient has been seen by GI, scheduled for EGD on Friday due to recent dose of aspirin and Brilinta. Patient received IV iron for iron  deficient anemia.  Hemoglobin has been stable.  EGD was performed, did not show any evidence of bleeding.   Wife does not want a colonoscopy due to poor prognosis.  We were not able to determine the cause of GI bleed, at this point, I will hold Brilinta until seen by PCP.     History of multiple strokes History of multiple strokes, bilateral cerebellar infarcts on 07/16/2022 s/p balloon angioplasty for posterior circulation occlusion Brilinta will be on hold due to GI bleed, I will attempt to restart aspirin.     Dementia without behavioral disturbance ([place]) Continue home medicines.   Primary hypertension BP stable   Type 2 diabetes mellitus with peripheral neuropathy (HCC) Hypoglycemia. Continue liquid diet, all insulin discontinued due to hypoglycemia.  Continue to follow.   Chronic kidney disease, stage 3b (HCC) Hypomagnesemia. Mild metabolic acidosis. Renal function is better than baseline, patient has a mild metabolic acidosis, will give a few days of sodium bicarbonate.  Magnesium level normalized.       Consultants: GI Procedures performed: EGD  Disposition: Home Diet recommendation:  Discharge Diet Orders (From admission, onward)     Start     Ordered   10/26/22 0000  Diet - low sodium heart healthy        10/26/22 1007           Cardiac diet DISCHARGE MEDICATION: Allergies as of 10/26/2022   No Known Allergies      Medication List     STOP taking these medications    Brilinta 90 MG  Tabs tablet Generic drug: ticagrelor   Foltanx 3-35-2 MG Tabs   glipiZIDE 2.5 MG 24 hr tablet Commonly known as: GLUCOTROL XL   Vitamin D (Ergocalciferol) 1.25 MG (50000 UNIT) Caps capsule Commonly known as: DRISDOL       TAKE these medications    acetaminophen 325 MG tablet Commonly known as: TYLENOL Take 2 tablets (650 mg total) by mouth every 4 (four) hours as needed for mild pain (or temp > 37.5 C (99.5 F)).   amLODipine 10 MG tablet Commonly known as:  NORVASC Take 10 mg by mouth daily.   aspirin EC 81 MG tablet Take 81 mg by mouth daily.   atorvastatin 80 MG tablet Commonly known as: LIPITOR Take 1 tablet (80 mg total) by mouth daily.   carvedilol 3.125 MG tablet Commonly known as: COREG Take 3.125 mg by mouth 2 (two) times daily.   cyanocobalamin 1000 MCG tablet Commonly known as: VITAMIN B12 Take 1 tablet (1,000 mcg total) by mouth daily.   donepezil 10 MG tablet Commonly known as: ARICEPT Take 10 mg by mouth at bedtime.   memantine 10 MG tablet Commonly known as: NAMENDA Take 10 mg by mouth 2 (two) times daily.   sodium bicarbonate 650 MG tablet Take 1 tablet (650 mg total) by mouth 3 (three) times daily for 5 days.   traMADol 50 MG tablet Commonly known as: ULTRAM Take 1 tablet (50 mg total) by mouth every 6 (six) hours as needed for moderate pain.               Durable Medical Equipment  (From admission, onward)           Start     Ordered   10/25/22 0801  For home use only DME Hospital bed  Once       Question Answer Comment  Length of Need Lifetime   The above medical condition requires: Patient requires the ability to reposition frequently   Head must be elevated greater than: 30 degrees   Bed type Semi-electric      10/25/22 0800            Follow-up Information     Derinda Late, MD Follow up in 1 week(s).   Specialty: Family Medicine Contact information: [address] [facility] and Internal Medicine [place] [address] [phone]                Discharge Exam: Danley Danker Weights   10/22/22 1007  Weight: 77.1 kg   General exam: Appears calm and comfortable  Respiratory system: Clear to auscultation. Respiratory effort normal. Cardiovascular system: S1 & S2 heard, RRR. No JVD, murmurs, rubs, gallops or clicks. No pedal edema. Gastrointestinal system: Abdomen is nondistended, soft and nontender. No organomegaly or masses felt. Normal bowel sounds  heard. Central nervous system: Alert and oriented x2. No focal neurological deficits. Extremities: Symmetric 5 x 5 power. Skin: No rashes, lesions or ulcers Psychiatry: Mood & affect appropriate.    Condition at discharge: fair  The results of significant diagnostics from this hospitalization (including imaging, microbiology, ancillary and laboratory) are listed below for reference.   Imaging Studies: No results found.  Microbiology: Results for orders placed or performed during the hospital encounter of 08/14/22  Resp panel by RT-PCR (RSV, Flu A&B, Covid) Anterior Nasal Swab     Status: None   Collection Time: 08/14/22  7:28 PM   Specimen: Anterior Nasal Swab  Result Value Ref Range Status  SARS Coronavirus 2 by RT PCR NEGATIVE NEGATIVE Final    Comment: (NOTE) SARS-CoV-2 target nucleic acids are NOT DETECTED.  The SARS-CoV-2 RNA is generally detectable in upper respiratory specimens during the acute phase of infection. The lowest concentration of SARS-CoV-2 viral copies this assay can detect is 138 copies/mL. A negative result does not preclude SARS-Cov-2 infection and should not be used as the sole basis for treatment or other patient management decisions. A negative result may occur with  improper specimen collection/handling, submission of specimen other than nasopharyngeal swab, presence of viral mutation(s) within the areas targeted by this assay, and inadequate number of viral copies(<138 copies/mL). A negative result must be combined with clinical observations, patient history, and epidemiological information. The expected result is Negative.  Fact Sheet for Patients:  EntrepreneurPulse.com.au  Fact Sheet for Healthcare Providers:  IncredibleEmployment.be  This test is no t yet approved or cleared by the [place] FDA and  has been authorized for detection and/or diagnosis of SARS-CoV-2 by FDA under an Emergency Use  Authorization (EUA). This EUA will remain  in effect (meaning this test can be used) for the duration of the COVID-19 declaration under Section 564(b)(1) of the Act, 21 U.S.C.section 360bbb-3(b)(1), unless the authorization is terminated  or revoked sooner.       Influenza A by PCR NEGATIVE NEGATIVE Final   Influenza B by PCR NEGATIVE NEGATIVE Final    Comment: (NOTE) The Xpert Xpress SARS-CoV-2/FLU/RSV plus assay is intended as an aid in the diagnosis of influenza from Nasopharyngeal swab specimens and should not be used as a sole basis for treatment. Nasal washings and aspirates are unacceptable for Xpert Xpress SARS-CoV-2/FLU/RSV testing.  Fact Sheet for Patients: EntrepreneurPulse.com.au  Fact Sheet for Healthcare Providers: IncredibleEmployment.be  This test is not yet approved or cleared by the [place] FDA and has been authorized for detection and/or diagnosis of SARS-CoV-2 by FDA under an Emergency Use Authorization (EUA). This EUA will remain in effect (meaning this test can be used) for the duration of the COVID-19 declaration under Section 564(b)(1) of the Act, 21 U.S.C. section 360bbb-3(b)(1), unless the authorization is terminated or revoked.     Resp Syncytial Virus by PCR NEGATIVE NEGATIVE Final    Comment: (NOTE) Fact Sheet for Patients: EntrepreneurPulse.com.au  Fact Sheet for Healthcare Providers: IncredibleEmployment.be  This test is not yet approved or cleared by the [place] FDA and has been authorized for detection and/or diagnosis of SARS-CoV-2 by FDA under an Emergency Use Authorization (EUA). This EUA will remain in effect (meaning this test can be used) for the duration of the COVID-19 declaration under Section 564(b)(1) of the Act, 21 U.S.C. section 360bbb-3(b)(1), unless the authorization is terminated or revoked.  Performed at [facility], [address] [place], [place] [address]     Labs: CBC: Recent Labs  Lab 10/22/22 1013 10/22/22 1501 10/23/22 0509 10/23/22 2012 10/24/22 1340 10/24/22 2000 10/25/22 0234 10/26/22 0450  WBC 10.3  --  5.7  --   --   --  6.7 9.1  HGB 8.9*   < > 7.9* 8.0* 8.6* 7.9* 8.2* 8.3*  HCT 29.0*   < > 25.2* 25.6* 27.1* 25.2* 26.1* 26.2*  MCV 86.6  --  85.7  --   --   --  85.3 86.2  PLT 406*  --  318  --   --   --  319 355   < > = values in this interval not displayed.   Basic Metabolic  Panel: Recent Labs  Lab 10/22/22 1013 10/23/22 0509 10/25/22 0234 10/26/22 0450  NA 141 145 138 138  K 3.7 3.7 3.5 3.5  CL 107 113* 107 109  CO2 21* 22 23 19*  GLUCOSE 239* 79 81 118*  BUN 31* '23 10 11  '$ CREATININE 1.47* 1.17 1.01 1.21  CALCIUM 8.8* 8.6* 8.4* 8.5*  MG  --   --  1.6* 1.9   Liver Function Tests: Recent Labs  Lab 10/22/22 1013 10/23/22 0509  AST 33 22  ALT 16 13  ALKPHOS 69 55  BILITOT 0.9 1.0  PROT 7.3 6.1*  ALBUMIN 3.5 3.0*   CBG: Recent Labs  Lab 10/25/22 1625 10/25/22 2012 10/25/22 2353 10/26/22 0509 10/26/22 0743  GLUCAP 126* 105* 114* 112* 100*    Discharge time spent: greater than 30 minutes.  Signed: Sharen Hones, MD [facility] 10/26/2022

## 2022-10-28 ENCOUNTER — Encounter: Payer: Self-pay | Admitting: *Deleted

## 2022-10-28 ENCOUNTER — Telehealth: Payer: Self-pay | Admitting: *Deleted

## 2022-10-28 ENCOUNTER — Encounter: Payer: Self-pay | Admitting: Gastroenterology

## 2022-10-28 ENCOUNTER — Other Ambulatory Visit: Payer: Self-pay | Admitting: *Deleted

## 2022-10-28 DIAGNOSIS — E1142 Type 2 diabetes mellitus with diabetic polyneuropathy: Secondary | ICD-10-CM

## 2022-10-28 DIAGNOSIS — F0390 Unspecified dementia without behavioral disturbance: Secondary | ICD-10-CM

## 2022-10-28 LAB — SURGICAL PATHOLOGY

## 2022-10-28 NOTE — TOC Progression Note (Signed)
Transition of Care [facility]) - Progression Note    Patient Details  Name: Mitchell Washington MRN: [id number] Date of Birth: 1951-04-27  [facility]) CM/SW Contact  Ross Ludwig, [place] Phone Number: 10/28/2022, 11:13 AM  Clinical Narrative:     CSW received phone call from patient's wife Deneise Lever [phone] who stated she was trying to speak to DME company regarding hospital bed.  CSW contacted Erasmo Downer, from [place] and she said the hospital bed should be delivered today, however [employer] was having some problems reaching patient and his wife.  CSW provided Erasmo Downer with his wife's cell phone number for them to try to contact her again.        Expected Discharge Plan and Services         Expected Discharge Date: 10/26/22                                     Social Determinants of Health (SDOH) Interventions SDOH Screenings   Food Insecurity: Unknown (10/22/2022)  Housing: Low Risk  (10/22/2022)  Transportation Needs: Unknown (10/22/2022)  Utilities: Unknown (10/22/2022)  Tobacco Use: Medium Risk (10/28/2022)    Readmission Risk Interventions     No data to display

## 2022-10-28 NOTE — Progress Notes (Unsigned)
This encounter was created in error - please disregard.

## 2022-10-28 NOTE — Progress Notes (Signed)
  Care Coordination   Note   10/28/2022 Name: [name] MRN: [id number] DOB: Mar 09, 1951  Mitchell Washington is a 72 y.o. year old male who sees Derinda Late, MD for primary care. I reached out to [employer] by phone today to offer care coordination services.  [name] was given information about Care Coordination services today including:   The Care Coordination services include support from the care team which includes your Nurse Coordinator, Clinical Social Worker, or Pharmacist.  The Care Coordination team is here to help remove barriers to the health concerns and goals most important to you. Care Coordination services are voluntary, and the patient may decline or stop services at any time by request to their care team member.   Care Coordination Consent Status: Patient agreed to services and verbal consent obtained.   Follow up plan:  Telephone appointment with care coordination team member scheduled for:  10/31/2022  Encounter Outcome:  Pt. Scheduled from referral   Julian Hy, [place] Direct Dial: [phone]

## 2022-10-30 ENCOUNTER — Telehealth: Payer: Self-pay

## 2022-10-30 DIAGNOSIS — I69952 Hemiplegia and hemiparesis following unspecified cerebrovascular disease affecting left dominant side: Secondary | ICD-10-CM | POA: Diagnosis not present

## 2022-10-30 DIAGNOSIS — N1832 Chronic kidney disease, stage 3b: Secondary | ICD-10-CM | POA: Diagnosis not present

## 2022-10-30 DIAGNOSIS — Z7984 Long term (current) use of oral hypoglycemic drugs: Secondary | ICD-10-CM | POA: Diagnosis not present

## 2022-10-30 DIAGNOSIS — E1140 Type 2 diabetes mellitus with diabetic neuropathy, unspecified: Secondary | ICD-10-CM | POA: Diagnosis not present

## 2022-10-30 DIAGNOSIS — I129 Hypertensive chronic kidney disease with stage 1 through stage 4 chronic kidney disease, or unspecified chronic kidney disease: Secondary | ICD-10-CM | POA: Diagnosis not present

## 2022-10-30 DIAGNOSIS — F0390 Unspecified dementia without behavioral disturbance: Secondary | ICD-10-CM | POA: Diagnosis not present

## 2022-10-30 DIAGNOSIS — E1122 Type 2 diabetes mellitus with diabetic chronic kidney disease: Secondary | ICD-10-CM | POA: Diagnosis not present

## 2022-10-30 DIAGNOSIS — M6281 Muscle weakness (generalized): Secondary | ICD-10-CM | POA: Diagnosis not present

## 2022-10-30 DIAGNOSIS — E7800 Pure hypercholesterolemia, unspecified: Secondary | ICD-10-CM | POA: Diagnosis not present

## 2022-10-30 NOTE — Telephone Encounter (Signed)
250 pm.  New Palliative Care Referral received for patient.  Phone call made to wife Deneise Lever to explain services.  She is agreeable for a visit on 11/06/22 @ 2 pm.

## 2022-10-31 ENCOUNTER — Other Ambulatory Visit (HOSPITAL_COMMUNITY): Payer: Self-pay | Admitting: Interventional Radiology

## 2022-10-31 ENCOUNTER — Telehealth (HOSPITAL_COMMUNITY): Payer: Self-pay

## 2022-10-31 ENCOUNTER — Ambulatory Visit: Payer: Self-pay | Admitting: *Deleted

## 2022-10-31 DIAGNOSIS — I771 Stricture of artery: Secondary | ICD-10-CM

## 2022-10-31 NOTE — Patient Instructions (Signed)
Visit Information  Thank you for taking time to visit with me today. Please don't hesitate to contact me if I can be of assistance to you.   Following are the goals we discussed today:   Goals Addressed             This Visit's Progress    Community resources to assist with patient in the home       Activities and task to complete in order to accomplish goals.   LEVELS OF CARE TASK Review private pay home care options provided and discussed Contact [facility] regarding incontinent supply program Contact [facility] [facility] regarding caregiver support group Discuss recommendation for [employer] with [facility]         Our next appointment is by telephone on 10/31/22 at 9am  Please call the care guide team at [phone] if you need to cancel or reschedule your appointment.   If you are experiencing a Mental Health or [place] or need someone to talk to, please call 911   Patient verbalizes understanding of instructions and care plan provided today and agrees to view in [place] [facility] [place]. Active MyChart status and patient understanding of how to access instructions and care plan via MyChart confirmed with patient.     Telephone follow up appointment with care management team member scheduled for: 11/01/22  Elliot Gurney, [place] Worker  [facility] Care Management [phone]

## 2022-10-31 NOTE — Telephone Encounter (Signed)
Pt's wife wants to schedule mri at [facility]. She will check with daughter and call back to schedule when they have a date. AB

## 2022-10-31 NOTE — Patient Outreach (Signed)
  Care Coordination   Initial Visit Note   10/31/2022 Name: Mitchell Washington MRN: [id number] DOB: 08/01/1951  Mitchell Washington is a 72 y.o. year old male who sees Derinda Late, MD for primary care. I spoke with  Mitchell Washington's spouse by phone today.  What matters to the patients health and wellness today?  Private Duty Care, DME needs and incontinent supplies    Goals Addressed             This Visit's Progress    Community resources to assist with patient in the home       Activities and task to complete in order to accomplish goals.   LEVELS OF CARE TASK Review private pay home care options provided and discussed Contact [facility] regarding incontinent supply program Contact [facility] [facility] regarding caregiver support group Discuss recommendation for [employer] with [facility]         SDOH assessments and interventions completed:  Yes  SDOH Interventions Today    Flowsheet Row Most Recent Value  SDOH Interventions   Food Insecurity Interventions Intervention Not Indicated  [[employer] Meals will be sent next week]  Transportation Interventions Intervention Not Indicated        Care Coordination Interventions:  Yes, provided  Interventions Today    Flowsheet Row Most Recent Value  Chronic Disease   Chronic disease during today's visit Other  [stroke]  General Interventions   General Interventions Discussed/Reviewed General Interventions Discussed, Emergency planning/management officer, Museum/gallery conservator (DME), Level of Care  [Discussed incontinent supplies program through [employer] free items per month]  Durable Medical Equipment (DME) Other  [patient's spouse confirms having a lift chair, BSC, wheelchair, however would like to be assessed for a [employer]. Mitchell Washington patient to discuss with HHPT upon their arrival next week-[facility]]  Level of [place]  [patient's spouse willing to consider private duty care-options  to be provided during next scheduled call-]  Nutrition Interventions   Nutrition Discussed/Reviewed Nutrition Discussed  [Patient previoulsy  set up with [employer] Meals to be delivered next week]       Follow up plan: Follow up call scheduled for 11/01/22    Encounter Outcome:  Pt. Visit Completed

## 2022-11-01 ENCOUNTER — Ambulatory Visit: Payer: Self-pay | Admitting: *Deleted

## 2022-11-01 NOTE — Patient Outreach (Signed)
  [place] Coordination   Follow Up Visit Note   11/01/2022 Name: [name] MRN: [id number] DOB: 07-27-1951  Mitchell Washington is a 72 y.o. year old male who sees Mitchell Late, MD for primary [place]. I spoke with  Mitchell Washington's spouse by phone today.  What matters to the patients health and wellness today?  Private Duty [place] and DME    Goals Addressed             This Visit's Progress    Community resources to assist with patient in the home       Activities and task to complete in order to accomplish goals.   LEVELS OF [place] TASK Review private pay home [place] options provided and discussed-additional community resources mailed to patient's home Mitchell Washington regarding incontinent supply [phone] Contact Mitchell Washington regarding caregiver support group [phone] Confirmed that Mitchell Washington has been approved delivery currently being coordinated         SDOH assessments and interventions completed:  No     [place] Coordination Interventions:  Yes, provided  Interventions Today    Flowsheet Row Most Recent Value  Chronic Disease   Chronic disease during today's visit Other  [stroke]  General Interventions   General Interventions Discussed/Reviewed General Interventions Reviewed, Durable Medical Equipment (DME), Community Resources, Level of [place]  Durable Medical Equipment (DME) Other  [Patient approved for [employer], delvery currently being coordinated]  Level of [place]  [List of providers that do not require minimum hours provided]       Follow up plan: Follow up call scheduled for 11/12/22    Encounter Outcome:  Pt. Visit Completed

## 2022-11-01 NOTE — Patient Instructions (Signed)
Visit Information  Thank you for taking time to visit with me today. Please don't hesitate to contact me if I can be of assistance to you.   Following are the goals we discussed today:   Goals Addressed             This Visit's Progress    Community resources to assist with patient in the home       Activities and task to complete in order to accomplish goals.   LEVELS OF CARE TASK Review private pay home care options provided and discussed-additional community resources mailed to patient's home [place] regarding incontinent supply program-[phone] Contact [facility] [place] [facility] regarding caregiver support group [phone] Confirmed that [employer] [employer] Lift has been approved delivery currently being coordinated         Our next appointment is by telephone on 11/12/22 at 9:30am  Please call the care guide team at [phone] if you need to cancel or reschedule your appointment.   If you are experiencing a Mental Health or [place] or need someone to talk to, please call 911   Patient verbalizes understanding of instructions and care plan provided today and agrees to view in [place]. Active MyChart status and patient understanding of how to access instructions and care plan via MyChart confirmed with patient.     Telephone follow up appointment with care management team member scheduled for: 11/12/22  Elliot Gurney, [place] Worker  [facility] Care Management [phone]

## 2022-11-05 DIAGNOSIS — F0390 Unspecified dementia without behavioral disturbance: Secondary | ICD-10-CM | POA: Diagnosis not present

## 2022-11-05 DIAGNOSIS — E1122 Type 2 diabetes mellitus with diabetic chronic kidney disease: Secondary | ICD-10-CM | POA: Diagnosis not present

## 2022-11-05 DIAGNOSIS — E1140 Type 2 diabetes mellitus with diabetic neuropathy, unspecified: Secondary | ICD-10-CM | POA: Diagnosis not present

## 2022-11-05 DIAGNOSIS — N1832 Chronic kidney disease, stage 3b: Secondary | ICD-10-CM | POA: Diagnosis not present

## 2022-11-05 DIAGNOSIS — I129 Hypertensive chronic kidney disease with stage 1 through stage 4 chronic kidney disease, or unspecified chronic kidney disease: Secondary | ICD-10-CM | POA: Diagnosis not present

## 2022-11-05 DIAGNOSIS — I69952 Hemiplegia and hemiparesis following unspecified cerebrovascular disease affecting left dominant side: Secondary | ICD-10-CM | POA: Diagnosis not present

## 2022-11-05 DIAGNOSIS — M6281 Muscle weakness (generalized): Secondary | ICD-10-CM | POA: Diagnosis not present

## 2022-11-06 ENCOUNTER — Other Ambulatory Visit: Payer: Medicare HMO

## 2022-11-06 VITALS — BP 140/68 | HR 67 | Temp 97.4°F

## 2022-11-06 DIAGNOSIS — Z515 Encounter for palliative care: Secondary | ICD-10-CM

## 2022-11-06 NOTE — Progress Notes (Unsigned)
PATIENT NAME: Mitchell Washington DOB: Apr 23, 1951 MRN: [id number]  PRIMARY CARE PROVIDER: Derinda Late, MD  RESPONSIBLE PARTY:  Acct ID - Guarantor Home Phone Work Phone Relationship Acct Type  [id number] Mitchell [phone]  Self P/F     [address] [place], [place] [id number]   Home visit completed with patient and wife-Mitchell Washington.   ACP:  DNR in place.  Uploaded into [employer].  Discussed MOST form with wife.  She states patient had previously advised her of no aggressive measures.  She will further discuss with him but feels comfort measures will be the choice.  Wife will take to PCP appointment this week to be completed.   Functional Status:  Patient found in the recliner chair with eyes closed.  Awakens briefly for short intervals but remains non-verbal.  Appears very weak and frail  Wife is using a hoyer lift to transfer patient.  She is completing all ADL's for patient.   Home Health:  Currently followed by [facility]. Therapy actively involved.  SW is also assisting with community resources to support patient and wife in the home.  Looking at respite care in the home, Medicaid, and Meals on Wheels.  Hospice: Wife is hopeful patient will regain strength and be able to ambulate again.  We discussed the possibility this may not occur and plan should that be the case.  Wife states PCP discussed hospice support should patient continue to decline.  I offered to explain hospice services and support but wife is not ready for this conversation.  I advised she is able to contact Palliative Care if/when this conversation needs to occur and we could facilitate transition into this program.   Palliative Care:  Services explained and consent signed.  Follow up visit scheduled for 11/27/22.  CODE STATUS: DNR ADVANCED DIRECTIVES: Yes MOST FORM: No PPS: 30%   PHYSICAL EXAM:   VITALS: Today's Vitals   11/06/22 1349  BP: (!) 140/68  Pulse: 67  Temp: (!) 97.4 F (36.3 C)  SpO2: 93%     LUNGS: clear to auscultation  CARDIAC: Cor RRR}  EXTREMITIES: - for edema SKIN: Skin color, texture, turgor normal. No rashes or lesions or mobility and turgor normal  NEURO: positive for gait problems, memory problems, speech problems, and weakness       Mitchell Burton, RN

## 2022-11-07 DIAGNOSIS — E1122 Type 2 diabetes mellitus with diabetic chronic kidney disease: Secondary | ICD-10-CM | POA: Diagnosis not present

## 2022-11-07 DIAGNOSIS — D62 Acute posthemorrhagic anemia: Secondary | ICD-10-CM | POA: Diagnosis not present

## 2022-11-07 DIAGNOSIS — Z8673 Personal history of transient ischemic attack (TIA), and cerebral infarction without residual deficits: Secondary | ICD-10-CM | POA: Diagnosis not present

## 2022-11-07 DIAGNOSIS — R29898 Other symptoms and signs involving the musculoskeletal system: Secondary | ICD-10-CM | POA: Diagnosis not present

## 2022-11-08 DIAGNOSIS — M6281 Muscle weakness (generalized): Secondary | ICD-10-CM | POA: Diagnosis not present

## 2022-11-08 DIAGNOSIS — E7800 Pure hypercholesterolemia, unspecified: Secondary | ICD-10-CM | POA: Diagnosis not present

## 2022-11-08 DIAGNOSIS — I129 Hypertensive chronic kidney disease with stage 1 through stage 4 chronic kidney disease, or unspecified chronic kidney disease: Secondary | ICD-10-CM | POA: Diagnosis not present

## 2022-11-08 DIAGNOSIS — I69952 Hemiplegia and hemiparesis following unspecified cerebrovascular disease affecting left dominant side: Secondary | ICD-10-CM | POA: Diagnosis not present

## 2022-11-08 DIAGNOSIS — F0390 Unspecified dementia without behavioral disturbance: Secondary | ICD-10-CM | POA: Diagnosis not present

## 2022-11-08 DIAGNOSIS — E1122 Type 2 diabetes mellitus with diabetic chronic kidney disease: Secondary | ICD-10-CM | POA: Diagnosis not present

## 2022-11-08 DIAGNOSIS — E559 Vitamin D deficiency, unspecified: Secondary | ICD-10-CM | POA: Diagnosis not present

## 2022-11-08 DIAGNOSIS — E1140 Type 2 diabetes mellitus with diabetic neuropathy, unspecified: Secondary | ICD-10-CM | POA: Diagnosis not present

## 2022-11-08 DIAGNOSIS — N1832 Chronic kidney disease, stage 3b: Secondary | ICD-10-CM | POA: Diagnosis not present

## 2022-11-11 ENCOUNTER — Telehealth (HOSPITAL_COMMUNITY): Payer: Self-pay

## 2022-11-11 NOTE — Telephone Encounter (Signed)
Spoke to pt's wife. They do not want to schedule mri at this time. AB

## 2022-11-12 ENCOUNTER — Ambulatory Visit: Payer: Self-pay | Admitting: *Deleted

## 2022-11-12 NOTE — Patient Outreach (Signed)
  Care Coordination   Follow Up Visit Note   11/12/2022 Name: Mitchell Washington MRN: [id number] DOB: 1951/03/21  Mitchell Washington is a 72 y.o. year old male who sees Derinda Late, MD for primary care. I spoke with  Mitchell Washington 's spouse by phone today.  What matters to the patients health and wellness today?  Community resources    Goals Addressed             This Visit's Progress    COMPLETED: Community resources to assist with patient in the home       Activities and task to complete in order to accomplish goals.   LEVELS OF CARE TASK Review private pay home care options provided and discussed-additional community resources mailed to patient's home-received and will be reviewed Contact [facility] regarding incontinent supply program-[phone]  Patient now active with Hospice and Palliative Care-SW and RN scheduled for today at 3pm          SDOH assessments and interventions completed:  No     Care Coordination Interventions:  Yes, provided  Interventions Today    Flowsheet Row Most Recent Value  Chronic Disease   Chronic disease during today's visit Other  [stroke]  General Interventions   General Interventions Discussed/Reviewed General Interventions Reviewed, [employer], Level of Care  Durable Medical Equipment (DME) Other  [Hoyer lift received and in use]  Level of Care --  [patient now being followed by [facility] and social worker to complete home visit today at 3pm]       Follow up plan: No further intervention required.   Encounter Outcome:  Pt. Visit Completed

## 2022-11-12 NOTE — Patient Instructions (Signed)
Visit Information  Thank you for taking time to visit with me today. Please don't hesitate to contact me if I can be of assistance to you.   Following are the goals we discussed today:   Goals Addressed             This Visit's Progress    COMPLETED: Community resources to assist with patient in the home       Activities and task to complete in order to accomplish goals.   LEVELS OF CARE TASK Review private pay home care options provided and discussed-additional community resources mailed to patient's home-received and will be reviewed Contact [facility] regarding incontinent supply program-[phone]-pending  Patient now active with Hospice and Palliative Care-SW and RN scheduled for today at 3pm          If you are experiencing a Mental Health or [place] or need someone to talk to, please call 911   Patient verbalizes understanding of instructions and care plan provided today and agrees to view in [place]. Active MyChart status and patient understanding of how to access instructions and care plan via MyChart confirmed with patient.     No further follow up required: patient now active with Hospice and Palliative Care-appointment with social worker and RN scheduled to visit family today at [employer], [place] Worker  [facility] Care Management [phone]

## 2022-11-18 DEATH — deceased

## 2022-11-27 ENCOUNTER — Other Ambulatory Visit: Payer: Medicare HMO

## 2023-01-20 ENCOUNTER — Ambulatory Visit: Payer: BLUE CROSS/BLUE SHIELD | Admitting: Podiatry
